# Patient Record
Sex: Female | Born: 1935 | Race: White | Hispanic: No | Smoking: Former smoker
Health system: Southern US, Community
[De-identification: ages and names within clinical notes are randomized; demographics above are authoritative.]

## PROBLEM LIST (undated history)

## (undated) DIAGNOSIS — E118 Type 2 diabetes mellitus with unspecified complications: Secondary | ICD-10-CM

## (undated) DIAGNOSIS — E785 Hyperlipidemia, unspecified: Secondary | ICD-10-CM

## (undated) DIAGNOSIS — E119 Type 2 diabetes mellitus without complications: Secondary | ICD-10-CM

## (undated) DIAGNOSIS — Z9289 Personal history of other medical treatment: Secondary | ICD-10-CM

## (undated) DIAGNOSIS — I249 Acute ischemic heart disease, unspecified: Secondary | ICD-10-CM

## (undated) DIAGNOSIS — F419 Anxiety disorder, unspecified: Secondary | ICD-10-CM

## (undated) DIAGNOSIS — I4891 Unspecified atrial fibrillation: Secondary | ICD-10-CM

## (undated) DIAGNOSIS — I350 Nonrheumatic aortic (valve) stenosis: Secondary | ICD-10-CM

## (undated) DIAGNOSIS — Z9861 Coronary angioplasty status: Secondary | ICD-10-CM

## (undated) DIAGNOSIS — T82855A Stenosis of coronary artery stent, initial encounter: Secondary | ICD-10-CM

## (undated) DIAGNOSIS — I10 Essential (primary) hypertension: Secondary | ICD-10-CM

## (undated) DIAGNOSIS — I219 Acute myocardial infarction, unspecified: Secondary | ICD-10-CM

## (undated) DIAGNOSIS — I2510 Atherosclerotic heart disease of native coronary artery without angina pectoris: Secondary | ICD-10-CM

## (undated) DIAGNOSIS — K219 Gastro-esophageal reflux disease without esophagitis: Secondary | ICD-10-CM

## (undated) HISTORY — DX: Acute ischemic heart disease, unspecified: I24.9

## (undated) HISTORY — DX: Nonrheumatic aortic (valve) stenosis: I35.0

## (undated) HISTORY — DX: Anxiety disorder, unspecified: F41.9

## (undated) HISTORY — DX: Type 2 diabetes mellitus with unspecified complications: E11.8

## (undated) HISTORY — DX: Coronary angioplasty status: Z98.61

## (undated) HISTORY — PX: TUBAL LIGATION: SHX77

## (undated) HISTORY — PX: CARDIAC CATHETERIZATION: SHX172

## (undated) HISTORY — DX: Essential (primary) hypertension: I10

## (undated) HISTORY — PX: ABDOMINAL HYSTERECTOMY: SHX81

## (undated) HISTORY — DX: Type 2 diabetes mellitus without complications: E11.9

## (undated) HISTORY — PX: BREAST BIOPSY: SHX20

## (undated) HISTORY — DX: Hyperlipidemia, unspecified: E78.5

## (undated) HISTORY — DX: Stenosis of coronary artery stent, initial encounter: T82.855A

## (undated) HISTORY — DX: Atherosclerotic heart disease of native coronary artery without angina pectoris: I25.10

## (undated) HISTORY — DX: Personal history of other medical treatment: Z92.89

## (undated) HISTORY — DX: Unspecified atrial fibrillation: I48.91

---

## 1997-12-22 HISTORY — PX: PERCUTANEOUS CORONARY STENT INTERVENTION (PCI-S): SHX6016

## 1998-09-18 ENCOUNTER — Ambulatory Visit (HOSPITAL_COMMUNITY): Admission: RE | Admit: 1998-09-18 | Discharge: 1998-09-19 | Payer: Self-pay | Admitting: Cardiology

## 2000-11-20 ENCOUNTER — Other Ambulatory Visit: Admission: RE | Admit: 2000-11-20 | Discharge: 2000-11-20 | Payer: Self-pay | Admitting: Obstetrics and Gynecology

## 2006-11-21 HISTORY — PX: CORONARY ANGIOPLASTY: SHX604

## 2010-12-22 HISTORY — PX: CARDIAC CATHETERIZATION: SHX172

## 2011-07-31 DIAGNOSIS — I2510 Atherosclerotic heart disease of native coronary artery without angina pectoris: Secondary | ICD-10-CM | POA: Insufficient documentation

## 2011-09-03 ENCOUNTER — Inpatient Hospital Stay (HOSPITAL_COMMUNITY): Payer: Medicare Other

## 2011-09-03 ENCOUNTER — Inpatient Hospital Stay (HOSPITAL_COMMUNITY)
Admission: AD | Admit: 2011-09-03 | Discharge: 2011-09-05 | DRG: 249 | Disposition: A | Discharge status: Home / Self Care | Payer: Medicare Other | Source: Ambulatory Visit | Attending: Cardiovascular Disease | Admitting: Cardiovascular Disease

## 2011-09-03 DIAGNOSIS — I2510 Atherosclerotic heart disease of native coronary artery without angina pectoris: Principal | ICD-10-CM | POA: Diagnosis present

## 2011-09-03 DIAGNOSIS — K922 Gastrointestinal hemorrhage, unspecified: Secondary | ICD-10-CM | POA: Diagnosis present

## 2011-09-03 DIAGNOSIS — Z9861 Coronary angioplasty status: Secondary | ICD-10-CM

## 2011-09-03 DIAGNOSIS — I200 Unstable angina: Secondary | ICD-10-CM | POA: Diagnosis present

## 2011-09-03 DIAGNOSIS — I359 Nonrheumatic aortic valve disorder, unspecified: Secondary | ICD-10-CM | POA: Diagnosis present

## 2011-09-03 DIAGNOSIS — E119 Type 2 diabetes mellitus without complications: Secondary | ICD-10-CM | POA: Diagnosis present

## 2011-09-03 DIAGNOSIS — D62 Acute posthemorrhagic anemia: Secondary | ICD-10-CM | POA: Diagnosis present

## 2011-09-03 DIAGNOSIS — E876 Hypokalemia: Secondary | ICD-10-CM | POA: Diagnosis not present

## 2011-09-03 DIAGNOSIS — N181 Chronic kidney disease, stage 1: Secondary | ICD-10-CM | POA: Diagnosis present

## 2011-09-03 LAB — COMPREHENSIVE METABOLIC PANEL
AST: 25 U/L (ref 0–37)
Albumin: 4.1 g/dL (ref 3.5–5.2)
Chloride: 104 mEq/L (ref 96–112)
Creatinine, Ser: 0.5 mg/dL (ref 0.50–1.10)
Potassium: 3.6 mEq/L (ref 3.5–5.1)
Total Bilirubin: 0.4 mg/dL (ref 0.3–1.2)

## 2011-09-03 LAB — APTT: aPTT: 30 seconds (ref 24–37)

## 2011-09-03 LAB — GLUCOSE, CAPILLARY
Glucose-Capillary: 138 mg/dL — ABNORMAL HIGH (ref 70–99)
Glucose-Capillary: 159 mg/dL — ABNORMAL HIGH (ref 70–99)

## 2011-09-03 LAB — URINALYSIS, ROUTINE W REFLEX MICROSCOPIC
Bilirubin Urine: NEGATIVE
Leukocytes, UA: NEGATIVE
Nitrite: NEGATIVE
Specific Gravity, Urine: 1.009 (ref 1.005–1.030)
pH: 7 (ref 5.0–8.0)

## 2011-09-03 LAB — PROTIME-INR: INR: 1.17 (ref 0.00–1.49)

## 2011-09-03 LAB — CBC
HCT: 24.4 % — ABNORMAL LOW (ref 36.0–46.0)
Hemoglobin: 8.2 g/dL — ABNORMAL LOW (ref 12.0–15.0)
RDW: 13.9 % (ref 11.5–15.5)
WBC: 5 10*3/uL (ref 4.0–10.5)

## 2011-09-04 LAB — BASIC METABOLIC PANEL
CO2: 24 mEq/L (ref 19–32)
GFR calc non Af Amer: >60 mL/min (ref >60)
Glucose, Bld: 161 mg/dL — ABNORMAL HIGH (ref 70–99)
Potassium: 3.9 mEq/L (ref 3.5–5.1)
Sodium: 142 mEq/L (ref 135–145)

## 2011-09-04 LAB — ABO/RH: ABO/RH(D): O POS

## 2011-09-04 LAB — GLUCOSE, CAPILLARY
Glucose-Capillary: 140 mg/dL — ABNORMAL HIGH (ref 70–99)
Glucose-Capillary: 132 mg/dL — ABNORMAL HIGH (ref 70–99)
Glucose-Capillary: 127 mg/dL — ABNORMAL HIGH (ref 70–99)

## 2011-09-04 LAB — IRON AND TIBC
Iron: 30 ug/dL — ABNORMAL LOW (ref 42–135)
Saturation Ratios: 8 % — ABNORMAL LOW (ref 20–55)
UIBC: 340 ug/dL (ref 125–400)

## 2011-09-04 LAB — POCT ACTIVATED CLOTTING TIME: Activated Clotting Time: 303 seconds

## 2011-09-04 LAB — CBC
MCHC: 33.6 g/dL (ref 30.0–36.0)
Platelets: 182 10*3/uL (ref 150–400)
RDW: 13.8 % (ref 11.5–15.5)

## 2011-09-04 LAB — PROTIME-INR
INR: 1.17 (ref 0.00–1.49)
Prothrombin Time: 15.1 seconds (ref 11.6–15.2)

## 2011-09-04 LAB — DIGOXIN LEVEL: Digoxin Level: 1 ng/mL (ref 0.8–2.0)

## 2011-09-04 LAB — FERRITIN: Ferritin: 18 ng/mL (ref 10–291)

## 2011-09-04 LAB — OCCULT BLOOD X 1 CARD TO LAB, STOOL: Fecal Occult Bld: NEGATIVE

## 2011-09-04 LAB — FOLATE: Folate: 17.5 ng/mL

## 2011-09-05 LAB — CBC
HCT: 26.6 % — ABNORMAL LOW (ref 36.0–46.0)
Hemoglobin: 9.2 g/dL — ABNORMAL LOW (ref 12.0–15.0)
MCH: 31.5 pg (ref 26.0–34.0)
MCHC: 34.6 g/dL (ref 30.0–36.0)
MCV: 91.1 fL (ref 78.0–100.0)
Platelets: 183 10*3/uL (ref 150–400)
RBC: 2.92 MIL/uL — ABNORMAL LOW (ref 3.87–5.11)
RDW: 13.6 % (ref 11.5–15.5)
WBC: 5.7 10*3/uL (ref 4.0–10.5)

## 2011-09-05 LAB — BASIC METABOLIC PANEL
CO2: 27 mEq/L (ref 19–32)
Calcium: 9.2 mg/dL (ref 8.4–10.5)
Chloride: 102 mEq/L (ref 96–112)
Creatinine, Ser: 0.55 mg/dL (ref 0.50–1.10)
Glucose, Bld: 136 mg/dL — ABNORMAL HIGH (ref 70–99)
Sodium: 138 mEq/L (ref 135–145)

## 2011-09-05 LAB — GLUCOSE, CAPILLARY: Glucose-Capillary: 159 mg/dL — ABNORMAL HIGH (ref 70–99)

## 2011-09-08 LAB — CROSSMATCH: Unit division: 0

## 2011-09-21 NOTE — Discharge Summary (Signed)
[name], [name]             ACCOUNT NO.:  [id number]  MEDICAL RECORD NO.:  [id number]  LOCATION:  2508                         FACILITY:  [facility]  PHYSICIAN:  Nanetta Batty, M.D.   DATE OF BIRTH:  [birth year]  DATE OF ADMISSION:  09/03/2011 DATE OF DISCHARGE:  09/05/2011                              DISCHARGE SUMMARY   DISCHARGE DIAGNOSIS: 1. Accelerated angina. 2. Coronary artery disease with history of angioplasty in the 90s now     with cardiac catheterization 95% right coronary artery stenosis     cause of her angina.     a.     PTCA and stent with bare-metal stent to the right coronary      artery. 3. Acute blood loss anemia secondary to gastrointestinal bleed, the     week prior to admission, now with heme-negative stools.     a.     Transfuse 1 unit of packed RBCs.     b.     Sees Dr. Teena Dunk in [place].  We have cancelled her EGD      for September 08, 2011.  We will plan to have her proceed with      EGD after 1 month with new stent placed and as per Dr. Clarene Duke. 4. Hypokalemia, replaced. 5. Diabetes mellitus type 2. 6. Mild aortic stenosis. 7. As an outpatient, thought to have chronic kidney disease, but labs     on admission, creatinine 0.50 with a BUN of 12, which would be     stage I.  DISCHARGE CONDITION:  Improved.  PROCEDURES:  Combined left heart catheterization by Dr. Nanetta Batty and undergoing PTCA and stent deployment with a bare-metal stent to the right coronary artery, which was her most likely culprit lesion.  Bare- metal was used as the patient needs gastrointestinal workup for recent gastrointestinal bleed with dark stools that were heme-positive.  DISCHARGE MEDICATIONS:  See medication reconciliation sheet.  DISCHARGE INSTRUCTIONS:  See pink sheet that was given to the patient, the copy of the pink sheet.  She will follow up with Dr. Clarene Duke.  No driving for 2 days.  No lifting for 1 week, heart-healthy diabetic diet.  HOSPITAL COURSE:   The patient was seen by Dr. Alanda Amass on September 02, 2011, who was covering for Dr. Clarene Duke, her regular cardiologist.  She is also a medical patient of Dr. Neita Carp, and she was seen for 2-3 weeks of mild somewhat vague substernal fullness and heaviness.  She also has some right shoulder and right upper arm radiation.  She had several episodes the week prior to the office visit, and it felt to be related to exertion and it would resolve within 2-3 minutes with rest.  She had not taken any nitroglycerin as well, but also the week prior to this visit, she had had episodes of dark tarry stools 2 days in a row.  She was referred to Dr. Teena Dunk, gastroenterologist in [place] and was seen by him on September 01, 2011, and she was set up for endoscopy on September 08, 2011, which is no cancelled.  Previously, she has undergone colonoscopy, which was negative, but again it was dark stools, and no upper endoscopy was done.  From a cardiac standpoint, she had a intervention and angioplasty in 1995 and 1996, and her last cardiac cath was in 1999, and she had a non- DES stent placed to the 90% stenosis of the RCA at that time.  Last Myoview was in 2008, which was negative for ischemia.  She had been doing quite well until the last 2-3 weeks.  When Dr. Alanda Amass saw her because of her diabetes and mild renal insufficiency with azotemia.  On last lab, he felt she needed cardiac catheterization and felt she should come to the hospital a day early to be hydrated.  Now, ACE and ARB were held.  Plans were for elective cardiac catheterization on September 04, 2011.  When the patient presented to the hospital, lab work showed hemoglobin 8.2, hematocrit of 24.4, creatinine was 0.50, and BUN of 12.  We typed and crossed her and by the next morning, we transfused 1 unit of packed cells prior to her cardiac catheterization to ensure that she would not become severely anemic during procedure.  Dr. Allyson Sabal did  cardiac cath.  The previous RCA stent was patent.  She did have 95% tight distal RCA stenosis, and Dr. Allyson Sabal placed a bare-metal stent in that area to 40-50% circumflex stenosis as well.  Her LV function was normal.  She tolerated the procedure and by the next day, had no complaints.  We did find her iron to be 30, total iron binding capacity 370, percent sat 8.  Vitamin B12 of 214, folate 17, dig level was 1.0.  Chemistry, sodium 138, potassium 3.5, BUN 9, creatinine 0.55, and glucose 136.  Hemoglobin was up to 9.2 with hematocrit 26.6.  She ambulated without any complications.  Dr. Royann Shivers saw her and felt she was ready for discharge.  We added iron to her medical regimen as well as plans for CBC on the September 08, 2011.  Plan will be not to do the EGD for 1 month.  Let her continue the Plavix for that time frame and then if she is cleared by Dr. Clarene Duke, she will proceed with EGD unless of course she has acute bleeding in the interim.  RADIOLOGY:  Chest x-ray, no acute cardiopulmonary abnormality.  Please note during this hospitalization her stools were heme negative.  TSH was 2.344.  LFTs were normal.  Iron level as stated was 30 with TIBC of 370, percent sat was 8, UIBC was 340.  B12 was 214 and folate 17.5, ferritin 18, and dig level as stated was 1.0.  The patient will follow up as instructed.     Darcella Gasman. Annie Paras, N.P.   ______________________________ Nanetta Batty, M.D.    LRI/MEDQ  D:  09/08/2011  T:  09/08/2011  Job:  [id number]  cc:   Anice Paganini, NP Thereasa Solo. Little, M.D.  Electronically Signed by Nada Boozer N.P. on 09/09/2011 11:16:52 AM Electronically Signed by Nanetta Batty M.D. on 09/21/2011 11:46:06 AM

## 2011-09-21 NOTE — Cardiovascular Report (Signed)
[name], POUNDS             ACCOUNT NO.:  [id number]  MEDICAL RECORD NO.:  [id number]  LOCATION:  2508                         FACILITY:  [facility]  PHYSICIAN:  Nanetta Batty, M.D.   DATE OF BIRTH:  1936-09-16  DATE OF PROCEDURE:  09/04/2011 DATE OF DISCHARGE:                           CARDIAC CATHETERIZATION   [name] is a delightful 75 year old Caucasian female, mother of three, grandmother of two grandchildren who is a retired Engineer, civil (consulting).  She has a history of CAD status post remote RCA POBA in 1995 and 1996 with stenting of her RCA in 1999.  She has had recurrent angina as well as GI bleed.  She was seen by Dr. Alanda Amass on September 02, 2011, and is referred for cath and potential intervention.  DESCRIPTION OF PROCEDURE:  The patient was brought to the second floor [facility] cardiac cath lab in the postabsorptive state.  She was premedicated with p.o. Valium.  Her right groin was prepped and shaved in the usual sterile fashion.  Xylocaine 1% was used for local anesthesia.  A 5-French sheath was inserted into the right femoral artery using standard Seldinger technique.  A 5-French right and left Judkins diagnostic catheter as well as 5-French pigtail catheter were used for selective coronary angiography and left ventriculography respectively.  Visipaque dye was used for the entirety of the case. Retrograde aorta, left ventricular, and pullback pressures were recorded.  The existing 5-French sheath was exchanged over wire for a 6-French sheath.  The patient received four chewable baby aspirin, Plavix 600 mg p.o., Pepcid 20 mg IV, and Angiomax bolus with an ACT of 303.  Total contrast used during this case was 110 mL for the patient.  Using a 6-French JR-4 guide catheter along with an 0.014 x 190 Asahi soft wire and a 2.0 x 12 Emerge balloon, PCI was performed at 10 atmospheres.  Stenting was performed with 2.5 x 15 Vision bare-metal stent at 12 atmospheres (2.6 mm with  an excellent step-up, TIMI 3 flow. Intracoronary nitroglycerin  200 mcg was administered.  The patient remained hemodynamically stable throughout the case.  The guidewire and catheter removed.  The sheath was then secured in place.  The patient left the lab in stable condition.  HEMODYNAMICS: 1. Aortic systolic pressure 167, diastolic pressure 64. 2. Left ventricular systolic pressure 162, end-diastolic pressure 11.  SELECTIVE CORONARY ANGIOGRAPHY: 1. Left main normal. 2. LAD; normal. 3. Left circumflex; 40-50% mid AV groove circumflex stenosis. 4. Right coronary artery; patent stent in mid portion of the right     coronary artery, 95% eccentric fairly focal lesion at the crux of     the vessel. 5. Left ventriculography; RAO left ventriculogram was performed using     25 mL of Visipaque dye at 12 mL per second.  The overall LVEF was     estimated at greater than 60% without focal wall motion     abnormalities.  IMPRESSION:  [name] has high-grade distal dominant right coronary artery stenosis, most likely her "culprit lesion" responsible for symptoms.  She does have a history of gastrointestinal bleed.  After discussion on the phone with Dr. Alanda Amass, I have elected to proceed with percutaneous coronary intervention  stenting using a bare-metal stent, Angiomax, aspirin, and Plavix.  Successful percutaneous coronary intervention stenting of the distal dominant right coronary artery using a bare-metal stent, aspirin, Plavix, and Angiomax.  The patient did receive a unit of blood prior to the procedure for beginning hemoglobin at approximately 8.  She will be hydrated overnight, and her hemoglobin will be rechecked in the morning. She will need Gastroenterology evaluation for determination of bleeding source.  The patient left the lab in stable condition.     Nanetta Batty, M.D.     JB/MEDQ  D:  09/04/2011  T:  09/05/2011  Job:  [id number]  cc:   [facility] Richard A. Alanda Amass, M.D. Thereasa Solo. Little, M.D.  Electronically Signed by Nanetta Batty M.D. on 09/21/2011 11:46:10 AM

## 2011-11-24 ENCOUNTER — Other Ambulatory Visit (HOSPITAL_COMMUNITY): Payer: Self-pay | Admitting: Cardiology

## 2011-11-24 ENCOUNTER — Encounter (HOSPITAL_COMMUNITY): Payer: Self-pay | Admitting: Pharmacy Technician

## 2011-11-24 MED ORDER — SODIUM CHLORIDE 0.9 % IJ SOLN: 3.0000 mL | INTRAMUSCULAR | Status: DC | PRN

## 2011-11-24 MED ORDER — ACETAMINOPHEN 325 MG PO TABS: 650.0000 mg | ORAL_TABLET | ORAL | Status: DC | PRN

## 2011-11-24 MED ORDER — ONDANSETRON HCL 4 MG/2ML IJ SOLN: 4.0000 mg | Freq: Four times a day (QID) | INTRAMUSCULAR | Status: DC | PRN

## 2011-11-24 MED ORDER — SODIUM CHLORIDE 0.9 % IV SOLN: INTRAVENOUS | Status: DC

## 2011-11-27 ENCOUNTER — Encounter (HOSPITAL_COMMUNITY): Payer: Self-pay | Admitting: General Practice

## 2011-11-27 ENCOUNTER — Ambulatory Visit (HOSPITAL_COMMUNITY)
Admission: RE | Admit: 2011-11-27 | Discharge: 2011-11-28 | Disposition: A | Discharge status: Home / Self Care | Payer: Medicare Other | Source: Ambulatory Visit | Attending: Cardiology | Admitting: Cardiology

## 2011-11-27 ENCOUNTER — Encounter (HOSPITAL_COMMUNITY)
Admission: RE | Disposition: A | Discharge status: Home / Self Care | Payer: Self-pay | Source: Ambulatory Visit | Attending: Cardiology

## 2011-11-27 ENCOUNTER — Other Ambulatory Visit: Payer: Self-pay

## 2011-11-27 DIAGNOSIS — Y831 Surgical operation with implant of artificial internal device as the cause of abnormal reaction of the patient, or of later complication, without mention of misadventure at the time of the procedure: Secondary | ICD-10-CM | POA: Insufficient documentation

## 2011-11-27 DIAGNOSIS — D62 Acute posthemorrhagic anemia: Secondary | ICD-10-CM | POA: Diagnosis present

## 2011-11-27 DIAGNOSIS — I2510 Atherosclerotic heart disease of native coronary artery without angina pectoris: Secondary | ICD-10-CM | POA: Insufficient documentation

## 2011-11-27 DIAGNOSIS — Z955 Presence of coronary angioplasty implant and graft: Secondary | ICD-10-CM

## 2011-11-27 DIAGNOSIS — I359 Nonrheumatic aortic valve disorder, unspecified: Secondary | ICD-10-CM | POA: Insufficient documentation

## 2011-11-27 DIAGNOSIS — E119 Type 2 diabetes mellitus without complications: Secondary | ICD-10-CM | POA: Insufficient documentation

## 2011-11-27 DIAGNOSIS — I200 Unstable angina: Secondary | ICD-10-CM | POA: Insufficient documentation

## 2011-11-27 DIAGNOSIS — I350 Nonrheumatic aortic (valve) stenosis: Secondary | ICD-10-CM | POA: Diagnosis present

## 2011-11-27 DIAGNOSIS — I214 Non-ST elevation (NSTEMI) myocardial infarction: Secondary | ICD-10-CM | POA: Diagnosis present

## 2011-11-27 DIAGNOSIS — Z9861 Coronary angioplasty status: Secondary | ICD-10-CM | POA: Insufficient documentation

## 2011-11-27 DIAGNOSIS — I152 Hypertension secondary to endocrine disorders: Secondary | ICD-10-CM | POA: Diagnosis present

## 2011-11-27 DIAGNOSIS — R195 Other fecal abnormalities: Secondary | ICD-10-CM | POA: Insufficient documentation

## 2011-11-27 DIAGNOSIS — T82897A Other specified complication of cardiac prosthetic devices, implants and grafts, initial encounter: Secondary | ICD-10-CM | POA: Insufficient documentation

## 2011-11-27 DIAGNOSIS — Z01810 Encounter for preprocedural cardiovascular examination: Secondary | ICD-10-CM | POA: Insufficient documentation

## 2011-11-27 DIAGNOSIS — E785 Hyperlipidemia, unspecified: Secondary | ICD-10-CM | POA: Insufficient documentation

## 2011-11-27 DIAGNOSIS — E1169 Type 2 diabetes mellitus with other specified complication: Secondary | ICD-10-CM | POA: Diagnosis present

## 2011-11-27 DIAGNOSIS — I10 Essential (primary) hypertension: Secondary | ICD-10-CM | POA: Insufficient documentation

## 2011-11-27 DIAGNOSIS — I249 Acute ischemic heart disease, unspecified: Secondary | ICD-10-CM | POA: Diagnosis present

## 2011-11-27 HISTORY — PX: LEFT HEART CATHETERIZATION WITH CORONARY ANGIOGRAM: SHX5451

## 2011-11-27 HISTORY — DX: Acute myocardial infarction, unspecified: I21.9

## 2011-11-27 HISTORY — DX: Gastro-esophageal reflux disease without esophagitis: K21.9

## 2011-11-27 SURGERY — LEFT HEART CATHETERIZATION WITH CORONARY ANGIOGRAM
Anesthesia: LOCAL

## 2011-11-27 MED ORDER — VERAPAMIL HCL 2.5 MG/ML IV SOLN
INTRAVENOUS | Status: AC
  Filled 2011-11-27: qty 4

## 2011-11-27 MED ORDER — ASPIRIN EC 81 MG PO TBEC
81.0000 mg | DELAYED_RELEASE_TABLET | Freq: Every day | ORAL | Status: DC
  Administered 2011-11-28: 81 mg via ORAL
  Filled 2011-11-27: qty 1

## 2011-11-27 MED ORDER — SODIUM CHLORIDE 0.9 % IJ SOLN: 3.0000 mL | INTRAMUSCULAR | Status: DC | PRN

## 2011-11-27 MED ORDER — DIGOXIN 125 MCG PO TABS
125.0000 ug | ORAL_TABLET | Freq: Every day | ORAL | Status: DC
  Administered 2011-11-28: 125 ug via ORAL
  Filled 2011-11-27 (×2): qty 1

## 2011-11-27 MED ORDER — CLOPIDOGREL BISULFATE 75 MG PO TABS
75.0000 mg | ORAL_TABLET | Freq: Every day | ORAL | Status: DC
  Administered 2011-11-28: 75 mg via ORAL
  Filled 2011-11-27: qty 1

## 2011-11-27 MED ORDER — POLYSACCHARIDE IRON COMPLEX 150 MG PO CAPS
150.0000 mg | ORAL_CAPSULE | Freq: Two times a day (BID) | ORAL | Status: DC
  Administered 2011-11-27 – 2011-11-28 (×2): 150 mg via ORAL
  Filled 2011-11-27 (×3): qty 1

## 2011-11-27 MED ORDER — ACETAMINOPHEN 325 MG PO TABS
650.0000 mg | ORAL_TABLET | ORAL | Status: DC | PRN
  Administered 2011-11-27: 650 mg via ORAL
  Filled 2011-11-27: qty 2

## 2011-11-27 MED ORDER — OMEGA-3 FATTY ACIDS 1000 MG PO CAPS
1.0000 g | ORAL_CAPSULE | Freq: Every day | ORAL | Status: DC
  Administered 2011-11-28: 1 g via ORAL
  Filled 2011-11-27 (×2): qty 1

## 2011-11-27 MED ORDER — HEPARIN (PORCINE) IN NACL 2-0.9 UNIT/ML-% IJ SOLN
INTRAMUSCULAR | Status: AC
  Filled 2011-11-27: qty 2000

## 2011-11-27 MED ORDER — MORPHINE SULFATE 2 MG/ML IJ SOLN: 1.0000 mg | INTRAMUSCULAR | Status: DC | PRN

## 2011-11-27 MED ORDER — BENAZEPRIL HCL 40 MG PO TABS
40.0000 mg | ORAL_TABLET | Freq: Every day | ORAL | Status: DC
  Administered 2011-11-28: 40 mg via ORAL
  Filled 2011-11-27 (×2): qty 1

## 2011-11-27 MED ORDER — LIDOCAINE HCL (PF) 1 % IJ SOLN
INTRAMUSCULAR | Status: AC
  Filled 2011-11-27: qty 30

## 2011-11-27 MED ORDER — NITROGLYCERIN 0.2 MG/ML ON CALL CATH LAB
INTRAVENOUS | Status: AC
  Filled 2011-11-27: qty 1

## 2011-11-27 MED ORDER — SODIUM CHLORIDE 0.9 % IJ SOLN: 3.0000 mL | Freq: Two times a day (BID) | INTRAMUSCULAR | Status: DC

## 2011-11-27 MED ORDER — ATENOLOL 50 MG PO TABS
50.0000 mg | ORAL_TABLET | Freq: Two times a day (BID) | ORAL | Status: DC
  Administered 2011-11-27 – 2011-11-28 (×2): 50 mg via ORAL
  Filled 2011-11-27 (×3): qty 1

## 2011-11-27 MED ORDER — SODIUM CHLORIDE 0.9 % IV SOLN: 250.0000 mL | INTRAVENOUS | Status: DC | PRN

## 2011-11-27 MED ORDER — FOLIC ACID 1 MG PO TABS
1.0000 mg | ORAL_TABLET | Freq: Every day | ORAL | Status: DC
  Administered 2011-11-27 – 2011-11-28 (×2): 1 mg via ORAL
  Filled 2011-11-27 (×2): qty 1

## 2011-11-27 MED ORDER — BIVALIRUDIN 250 MG IV SOLR
INTRAVENOUS | Status: AC
  Filled 2011-11-27: qty 250

## 2011-11-27 MED ORDER — SODIUM CHLORIDE 0.9 % IV SOLN
INTRAVENOUS | Status: DC
  Administered 2011-11-27: 08:00:00 via INTRAVENOUS

## 2011-11-27 MED ORDER — AMLODIPINE BESYLATE 10 MG PO TABS
10.0000 mg | ORAL_TABLET | Freq: Every day | ORAL | Status: DC
  Administered 2011-11-28: 10 mg via ORAL
  Filled 2011-11-27 (×2): qty 1

## 2011-11-27 MED ORDER — HYDRALAZINE HCL 25 MG PO TABS
25.0000 mg | ORAL_TABLET | Freq: Two times a day (BID) | ORAL | Status: DC
  Administered 2011-11-27 – 2011-11-28 (×2): 25 mg via ORAL
  Filled 2011-11-27 (×3): qty 1

## 2011-11-27 MED ORDER — SODIUM CHLORIDE 0.9 % IJ SOLN
3.0000 mL | Freq: Two times a day (BID) | INTRAMUSCULAR | Status: DC
  Administered 2011-11-27 – 2011-11-28 (×2): 3 mL via INTRAVENOUS

## 2011-11-27 MED ORDER — ASPIRIN 81 MG PO CHEW
CHEWABLE_TABLET | ORAL | Status: AC
  Filled 2011-11-27: qty 3

## 2011-11-27 MED ORDER — FUROSEMIDE 20 MG PO TABS
20.0000 mg | ORAL_TABLET | Freq: Two times a day (BID) | ORAL | Status: DC
  Administered 2011-11-27 – 2011-11-28 (×2): 20 mg via ORAL
  Filled 2011-11-27 (×3): qty 1

## 2011-11-27 MED ORDER — PANTOPRAZOLE SODIUM 40 MG PO TBEC
40.0000 mg | DELAYED_RELEASE_TABLET | Freq: Every day | ORAL | Status: DC
  Administered 2011-11-28: 40 mg via ORAL
  Filled 2011-11-27: qty 1

## 2011-11-27 MED ORDER — SODIUM CHLORIDE 0.9 % IV SOLN: INTRAVENOUS | Status: DC

## 2011-11-27 MED ORDER — OLMESARTAN MEDOXOMIL 40 MG PO TABS
40.0000 mg | ORAL_TABLET | Freq: Every day | ORAL | Status: DC
  Administered 2011-11-28: 40 mg via ORAL
  Filled 2011-11-27 (×2): qty 1

## 2011-11-27 MED ORDER — ROSUVASTATIN CALCIUM 20 MG PO TABS
20.0000 mg | ORAL_TABLET | Freq: Every day | ORAL | Status: DC
  Administered 2011-11-27: 20 mg via ORAL
  Filled 2011-11-27 (×2): qty 1

## 2011-11-27 MED ORDER — DIPHENHYDRAMINE HCL 50 MG/ML IJ SOLN
INTRAMUSCULAR | Status: AC
  Filled 2011-11-27: qty 1

## 2011-11-27 MED ORDER — SODIUM CHLORIDE 0.9 % IV SOLN: 1.0000 mL/kg/h | INTRAVENOUS | Status: AC

## 2011-11-27 MED ORDER — ASPIRIN 81 MG PO CHEW
243.0000 mg | CHEWABLE_TABLET | Freq: Once | ORAL | Status: AC
  Administered 2011-11-27: 243 mg via ORAL

## 2011-11-27 MED ORDER — ONDANSETRON HCL 4 MG/2ML IJ SOLN: 4.0000 mg | Freq: Four times a day (QID) | INTRAMUSCULAR | Status: DC | PRN

## 2011-11-27 MED ORDER — FENTANYL CITRATE 0.05 MG/ML IJ SOLN
INTRAMUSCULAR | Status: AC
  Filled 2011-11-27: qty 2

## 2011-11-27 MED ORDER — HEPARIN SODIUM (PORCINE) 1000 UNIT/ML IJ SOLN
INTRAMUSCULAR | Status: AC
  Filled 2011-11-27: qty 1

## 2011-11-27 MED ORDER — ASPIRIN 81 MG PO CHEW: 324.0000 mg | CHEWABLE_TABLET | Freq: Once | ORAL | Status: DC

## 2011-11-27 NOTE — Op Note (Signed)
[facility]     CARDIAC CATHETERIZATION REPORT  NAME: [name] MRN: [id number] DOB: [birth year]  ADMIT DATE:   Performing Cardiologist: Marykay Lex, M.D. MS  Primary Physician: Provider Not In System Primary Cardiologist:  Julieanne Manson M.D.  Procedures Performed:  Left Heart Catheterization via 5 Fr Right Radial Artery access  Left Ventriculography, (RAO) - hand-injection, 10 mL  Native Coronary Angiography  Percutaneous Coronary Artery Intervention (Cutting Balloon Atherectomy/Angioplasty) on distal RCA in-stent restenosis 90% reduced to less than 10% with a 2.5 mm x 10 mm Flextome Cutting Balloon; final diameter 2.65 mm  Indication(s): Recurrent angina  Status post recent terminal stent to RCA (2.5 mm x 15 mm Vision BMS (bare-metal stent)    History: [name] is a 75 year old female patient of Dr. Julieanne Manson with a history of PCI to the RCA 1999 and subsequent PCI of the RCA to the distal RCA for chest pain/right shoulder pain in September 2012. She was in her usual state of health until her for a week ago which began to have recurrence of her right shoulder/arm and chest pain. She was seen by Dr. Clarene Duke on December 4 felt that this was recurrence of her anginal symptoms and referred her for invasive cardiac evaluation for cardiac apposition posterior minus PCI. She requested radial artery access and therefore I was asked performed procedure.  A bare-metal stent was used due to the patient's history of GI bleed.  Consent: The procedure with Risks/Benefits/Alternatives and Indications was reviewed with the patient (and family).  All questions were answered.    Risks / Complications include, but not limited to: Death, MI, CVA/TIA, VF/VT (with defibrillation), Bradycardia (need for temporary pacer placement), contrast induced nephropathy , bleeding / bruising / hematoma / pseudoaneurysm, vascular or coronary injury (with possible  emergent CT or Vascular Surgery), adverse medication reactions, infection.    The patient ( and family) voice understanding and agree to proceed.    Risks of procedure as well as the alternatives and risks of each were explained to the (patient/caregiver).  Consent for procedure obtained. Consent for signed by MD and patient with RN witness -- placed on chart.  Procedure: The patient was brought to the 2nd Floor [facility] in the fasting state and prepped and draped in the usual sterile fashion for (Right groin or radial) access.  A modified Allen's test with plethysmography was performed on the right wrist demonstrating adequate Ulnar Artery collateral flow).    Sterile technique was used including antiseptics, cap, gloves, gown, hand hygiene, mask and sheet.  Skin prep: Chlorhexidine;  Time Out: Verified patient identification, verified procedure, site/side was marked, verified correct patient position, special equipment/implants available, medications/allergies/relevent history reviewed, required imaging and test results available.  Performed  The right wrist was anesthetized with 1% subcutaneous Lidocaine.  The right radial artery was accessed using the Seldinger Technique with placement of a  5 Fr Glide Sheath. The sheath was aspirated and flushed.  Then a total of  10 ml of standard Radial Artery Cocktail (see medications) was infused.  Radial Cocktail: 5 mg Verapamil, 400 mcg NTG, 2 ml 2% Lidocaine.  A  5 Fr  TIG 4.0 Catheter was advanced of over a Lexicographer J wire into the ascending Aorta.  The catheter was used to engage the left than right coronary arteries.  Multiple cineangiographic views of both the Left and Right coronary artery system(s) were performed.   Hemodynamics:  Central  Aortic Pressure: 01/14/1944 mmHg; 70 mmHg  LV Pressure / LV End diastolic Pressure: 125/6 mmHg; 18 mmHg  Coronary Angiographic Data:   Dominance: Right  Left Main:   Large-caliber, angiographically normal; bifurcation into LAD and circumflex  Left Anterior Descending (LAD):  Moderate to large caliber, angiographically normal  1st diagonal (D1):  Small-caliber, tortuous, angiographically normal  2nd diagonal (D2):  Small-caliber, tortuous, angiographically normal  Circumflex (LCx):  Moderate to large-caliber, angiographically normal   1st obtuse marginal:   Small-caliber, ostial 60%.  2nd obtuse marginal:  Large branching vessel as the terminal and main branch vessel it gives rise to 2 inferior branches as OMs; angiographically normal   Atrio-ventricular Groove (AVG) Cx: Small-caliber with small posterior lateral branches, angiograpically normal  Right Coronary Artery: Moderate to large caliber dominant vessel, proximal stent from 1999 widely patent. Distal stent in midportion, 90% focal in-stent restenosis, non-thrombotic  Right Posterior Descending Artery (RPDA):  Small-caliber, minimal luminal irregularities  Right Atrio-ventricular Groove Branch (RPAVB):  Small-caliber minimal, luminal irregularities, branches into the 3 posterior lateral branches that taper to her small-caliber vessels with angiographically only minimal luminal irregularities  PERCUTANEOUS CORONARY INTERVENTION PROCEDURE  After reviewing the initial cineangiography images, the culprit lesion in the distal RCA stent was identified, and the decision was made to proceed with percutaneous coronary intervention.  Next discuss with Dr. Clarene Duke plan was to perform cutting balloon atherectomy.  The 5Fr Sheath was exchanged over a wire for a 6Fr sheath.  However a 6 [place] guide was successfully advanced therefore I should use a 5 [place] guide.  Additional intra-arterial nitroglycerin (400 g total) given through catheter in antecubital brachial artery.  A 6 [place] guide removed an additional 5 mg of her verapamil was a minister intra-arterially through the sheath for presumed spasm. Scout  brachial artery angiography revealed no perforation or dissection with mild spasm. The 5 [place] guide was advanced under fluoroscopic guidance through the tortuous portion in the antecubital region and successfully advanced into the ascending aorta.  A weight based bolus of IV Angiomax was administered and the drip was continued until completion of the procedure.  Oral  75 mg Clopidogrel and aspirin 324 milligrams was administered as per her home medications preprocedure.  The  5 [place] JR 4 Guide catheter  wasadvanced over a  long exchange safety J-wire and used to engage the right Coronary Artery. An ACT of > 200 Sec was confirmed prior to advancing the Guidewire.  Lesion #1:  Distal RCA, mid stent ISR  Pre-PCI Stenosis:  90 % Post-PCI Stenosis:  Less than 10 %     TIMI  3 flow       TIMI  3 flow  Guide Catheter:  5 [place] JR 4    Guidewire:  Training and development officer Balloon: Flextome 2.5 mm x 10 mm    1st Inflation:   6 Atm for 50 Sec   2nd Inflation: 10 Atm for 60 Sec   3rd Inflation: 12 Atm for 120 Sec  4th inflation: 12 Atm for 120 Sec -- Final diameter 2.6 mm   Post deployment angiography revealed no dissection or perforation, preserved TIMI-3 flow, appropriate fashion of the previously placed stent with less than 10% stenosis.  This catheter was then advanced across the Aortic Valve over a wire.  LV hemodynamics were measured (and) Left Ventriculography was performed via hand injection.  LV hemodynamics were then re-sampled, and the catheter was pulled back across the Aortic Valve for measurement of "pull-back" gradient.  The catheter was removed completely out of the body over the [employer] J wire.  The sheath was removed in the Cath Lab with a TR band placed at  12 ml Air at  1030 hrs.  Reverse Allen''stest with plethysmography revealed non-occlusive hemostasis.  The patient was transported to the  PACU in  Stable condition.   The patient  was stable before, during and  following the procedure.   Patient did tolerate procedure well. There were not complications.  EBL:  Less than 10 mL  Medications:  Sedation:  25 mg IV  Benadryl ,  25 mcg  IV Fentanyl  Contrast:  170 ml Omnipaque  Radial Cocktail: 5 mg Verapamil, 400 mcg NTG, 2 ml 2% Lidocaine; + additional 5 mg Verapamil, 400 mcg NTG intra-arterial via sheath/catheter.  IV Heparin: 3600 units IV at time of Catheter placement  Angiomax: Bolus (0.75 mg/kg), drip (1.75 mg per kilogram per hour) during intervention  Impression: 1.  ~90% focal ISR of recently placed BMS in distal RCA, other coronary arteries without obstructive lesions.  Proximal RCA stent is widely patent. 2.  Successful Cutting Balloon Atherectomy / Angioplasty of distal RCA stent 3.  Preserved LVEF of ~55%, no WMA  Plan: 1.  Monitor overnight post PCI, standard post radial care 2.  Continue current medications 3.  Anticipate d/c tomorrow if stable.  The case and results was discussed with the patient (and family). The case and results was not discussed with the patient's PCP. The case and results was discussed with the patient's Cardiologist.  Time Spend Directly with Patient:  75  minutes  Timi Reeser W, M.D., M.S. [facility] [address] [place], [place]  [address]  [phone]  11/27/2011 10:58 AM

## 2011-11-27 NOTE — Brief Op Note (Addendum)
11/27/2011  10:45 AM  PATIENT:  [name]  75 y.o. female with a A history of PCI to the RCA in 1999, who subsequently underwent PCI of the RCA for chest pain/R shoulder pain in 08/2011. This initially lead to resolution of her symptoms, however the R shoulder pain has recurred, and her energy level has deteriorated despite cardiac rehab. The shoulder pain occurs with activity and also wakes her up from sleep.  PRE-OPERATIVE DIAGNOSIS:  chest pain, known CAD with recent PCI to RCA  POST-OPERATIVE DIAGNOSIS:  Severe ~90% ISR of recently deployed 2.5 mm x 15 mm Vision BMS --> Rx'd with 2.14mm Cutting balloon Angioplasty  PROCEDURE:  LEFT HEART CATHETERIZATION WITH CORONARY ANGIOGRAM and LEFT VENTRICULOGRAM - via 5Fr Radial access (5 Fr TIG 4.0 Catheter)  PERCUTANEOUS CORONARY INTERVENTION - CUTTING BALLOON ATHERECTOMY OF DISTAL RCA STENT IN-STENT RESTENOSIS. (6Fr sheath, bu 5 JR4 Guide Catheter - due to brachial artery tortuosity).  INTRA CORONARY, AND INTRA-ARTERIAL NITROGLYCERIN INJECTION  Surgeon(s): Marykay Lex  ANESTHESIA:   local and IV sedation -- Fentanyl ; Benadryl 25 mg MEDICATIONS:   Angiomax Bolus  (0.75mg /kg) ; drip (1.75mg /kg/hr) during intervention  Radial Cocktail: 5 mg Verapamil, 400 mcg NTG, 2 ml 2% Lidocaine; Additional 5 mg Verapamil and 400 mcg NTG also administered IA via sheath or catheter in brachial artery.  IC Nitroglycerin - total 400 mcg  EBL:   <59ml  DEVICE - 2.5 mm x 10 mm Flextome Cutting Balloon   6 ATM - 45 sec; 10 ATM - 60 sec; 12 ATM - 120 sec x 2  LOCAL MEDICATIONS USED:  LIDOCAINE 2 ml   TOURNIQUET: TR Band - 12 ml air; 1030 hrs  DICTATION: .Note written in EPIC  PLAN OF CARE: Admit for overnight observation  PATIENT DISPOSITION:  PACU - hemodynamically stable.  Continue DAP ( ASA, Plavix) & home antihypertensives  Formulary alternative for statin.  Pharmacological VTE agent (>24hrs) due to surgical blood loss or risk  of bleeding:  Not needed.  Primary Cardiologist - Dr. Langston Reusing informed.

## 2011-11-27 NOTE — H&P (Addendum)
History and Physical Interval Note:  NAME:  [name]   MRN: [id number] DOB:  12-12-1936    ADMIT DATE: 11/27/2011   11/27/2011 7:55 AM  [name] is a 75 y.o. female with a  A history of PCI to the RCA in 1999, who subsequently underwent PCI of the RCA for chest pain/R shoulder pain in 08/2011.  This initially lead to resolution of her symptoms, however the R shoulder pain has recurred, and her energy level has deteriorated despite cardiac rehab.  The shoulder pain occurs with activity and also wakes her up from sleep.  Mrs. Satre has presented today for surgery, with the diagnosis of chest pain - and known CAD, s/p RCA PCI.  The various methods of treatment have been discussed with the patient and family. After consideration of risks, benefits and other options for treatment, the patient has consented to Procedure(s):  LEFT HEART CATHETERIZATION AND CORONARY ANGIOGRAPHY +/- AD HOC PERCUTANEOUS CORONARY INTERVENTION as a surgical intervention.   The patients' history has been reviewed, patient examined, no change in status from most recent note - dated 11/25/11 by Dr. Clarene Duke.  She stable for surgery. I have reviewed the patients' chart and labs.   She has requested radial artery access, therefore, Dr. Clarene Duke has asked me to perform the procedure. Questions were answered to the patient's satisfaction.    The procedure with Risks/Benefits/Alternatives and Indications was reviewed with the patient and her husband.  All questions were answered.    Risks / Complications include, but not limited to: Death, MI, CVA/TIA, VF/VT (with defibrillation), Bradycardia (need for temporary pacer placement), contrast induced nephropathy, bleeding / bruising / hematoma / pseudoaneurysm, vascular or coronary injury (with possible emergent CT or Vascular Surgery), adverse medication reactions, infection.    The patient (and family) voice understanding and agree to proceed.   I have signed the consent  form and placed it on the chart for patient signature and RN witness.     Marykay Lex, M.D., M.S. [facility] [address] [place], [place]  [address]  [phone]  11/27/2011 7:55 AM

## 2011-11-28 ENCOUNTER — Other Ambulatory Visit: Payer: Self-pay

## 2011-11-28 DIAGNOSIS — E785 Hyperlipidemia, unspecified: Secondary | ICD-10-CM | POA: Diagnosis present

## 2011-11-28 DIAGNOSIS — I350 Nonrheumatic aortic (valve) stenosis: Secondary | ICD-10-CM | POA: Diagnosis present

## 2011-11-28 DIAGNOSIS — D62 Acute posthemorrhagic anemia: Secondary | ICD-10-CM | POA: Diagnosis present

## 2011-11-28 DIAGNOSIS — I249 Acute ischemic heart disease, unspecified: Secondary | ICD-10-CM | POA: Diagnosis present

## 2011-11-28 DIAGNOSIS — I214 Non-ST elevation (NSTEMI) myocardial infarction: Secondary | ICD-10-CM

## 2011-11-28 DIAGNOSIS — Z955 Presence of coronary angioplasty implant and graft: Secondary | ICD-10-CM

## 2011-11-28 DIAGNOSIS — I152 Hypertension secondary to endocrine disorders: Secondary | ICD-10-CM | POA: Diagnosis present

## 2011-11-28 DIAGNOSIS — I10 Essential (primary) hypertension: Secondary | ICD-10-CM | POA: Diagnosis present

## 2011-11-28 DIAGNOSIS — E119 Type 2 diabetes mellitus without complications: Secondary | ICD-10-CM | POA: Diagnosis present

## 2011-11-28 DIAGNOSIS — E1169 Type 2 diabetes mellitus with other specified complication: Secondary | ICD-10-CM | POA: Diagnosis present

## 2011-11-28 HISTORY — DX: Hyperlipidemia, unspecified: E78.5

## 2011-11-28 HISTORY — DX: Non-ST elevation (NSTEMI) myocardial infarction: I21.4

## 2011-11-28 LAB — GLUCOSE, CAPILLARY
Glucose-Capillary: 152 mg/dL — ABNORMAL HIGH (ref 70–99)
Glucose-Capillary: 128 mg/dL — ABNORMAL HIGH (ref 70–99)

## 2011-11-28 LAB — BASIC METABOLIC PANEL
BUN: 10 mg/dL (ref 6–23)
CO2: 24 mEq/L (ref 19–32)
Chloride: 104 mEq/L (ref 96–112)
Creatinine, Ser: 0.47 mg/dL — ABNORMAL LOW (ref 0.50–1.10)
Glucose, Bld: 152 mg/dL — ABNORMAL HIGH (ref 70–99)

## 2011-11-28 LAB — CBC
HCT: 29.1 % — ABNORMAL LOW (ref 36.0–46.0)
Hemoglobin: 9 g/dL — ABNORMAL LOW (ref 12.0–15.0)
MCH: 26.2 pg (ref 26.0–34.0)
MCHC: 30.9 g/dL (ref 30.0–36.0)
MCV: 84.6 fL (ref 78.0–100.0)
Platelets: 169 10*3/uL (ref 150–400)
RBC: 3.44 MIL/uL — ABNORMAL LOW (ref 3.87–5.11)
RDW: 15.7 % — ABNORMAL HIGH (ref 11.5–15.5)
WBC: 5.4 10*3/uL (ref 4.0–10.5)

## 2011-11-28 LAB — OCCULT BLOOD X 1 CARD TO LAB, STOOL: Fecal Occult Bld: POSITIVE

## 2011-11-28 MED ORDER — METFORMIN HCL 500 MG PO TABS: 1000.0000 mg | ORAL_TABLET | Freq: Every day | ORAL | Status: DC

## 2011-11-28 MED ORDER — ACETAMINOPHEN 325 MG PO TABS
650.0000 mg | ORAL_TABLET | ORAL | Status: AC | PRN
Start: 2011-11-28 — End: 2011-12-08

## 2011-11-28 MED ORDER — NITROGLYCERIN 0.4 MG SL SUBL: 0.4000 mg | SUBLINGUAL_TABLET | SUBLINGUAL | Status: DC | PRN

## 2011-11-28 MED FILL — Dextrose Inj 5%: INTRAVENOUS | Qty: 50 | Status: AC

## 2011-11-28 NOTE — Progress Notes (Signed)
CARDIAC REHAB PHASE I   PRE:  Rate/Rhythm: 69 SR  BP:  Supine:   Sitting: 165/5  Standing:    SaO2: 100 RA  MODE:  Ambulation: 680 ft   POST:  Rate/Rhythem: 91 SR  BP:  Supine:   Sitting: 151/48   Standing:    SaO2: 100 RA 0900 -1000 Tolerated ambulation well without c/o.Completed discharge education with pt. She voices understanding. She is already in [facility]. CRP in [place], ready to restart when allowed by MD.  [name]

## 2011-11-28 NOTE — Progress Notes (Signed)
Subjective:  No further dyspnea or shoulder pain. No recent melanic stools.  Objective:  Vital Signs in the last 24 hours: Temp:  [97.7 F (36.5 C)-98.3 F (36.8 C)] 97.7 F (36.5 C) (12/07 0700) Pulse Rate:  [61-74] 61  (12/07 0700) Resp:  [14-20] 18  (12/07 0700) BP: (129-150)/(51-82) 136/51 mmHg (12/07 0700) SpO2:  [94 %-100 %] 100 % (12/07 0700) Weight:  [69.7 kg (153 lb 10.6 oz)] 153 lb 10.6 oz (69.7 kg) (12/07 1610)  Intake/Output from previous day:  Intake/Output Summary (Last 24 hours) at 11/28/11 1051 Last data filed at 11/28/11 0648  Gross per 24 hour  Intake 834.55 ml  Output   1200 ml  Net -365.45 ml    Physical Exam: General appearance: alert, cooperative and no distress Lungs: clear to auscultation bilaterally Heart: regular rate and rhythm and 2/6 syst murmur Aov area and LSB Extrem: Rt wrist withou hematoma   Rate: 60  Rhythm: normal sinus rhythm and PVC's  Lab Results:  Basename 11/28/11 0358  WBC 5.4  HGB 9.0*  PLT 169    Basename 11/28/11 0358  NA 141  K 3.4*  CL 104  CO2 24  GLUCOSE 152*  BUN 10  CREATININE 0.47*   No results found for this basename: TROPONINI:2,CK,MB:2 in the last 72 hours Hepatic Function Panel No results found for this basename: PROT,ALBUMIN,AST,ALT,ALKPHOS,BILITOT,BILIDIR,IBILI in the last 72 hours No results found for this basename: CHOL in the last 72 hours No results found for this basename: INR in the last 72 hours  Imaging: No results found.  Cardiac Studies:  Assessment/Plan:   Principal Problem:  *Acute coronary syndrome  Active Problems:  CAD-RCA PCI 1999, RCA BMS Sept 2012, ISR this adm Rx'd with HSRA  Anemia, GI bleed Sept 2012. Followed in [place], had upper GI as op.  Diabetes mellitus  Unstable angina  Hypertension  Dyslipidemia  Mild AS by 2D Sept 2012  Plan-Check stool guaiac, if positive she may need further in-pt w/u.Corine Shelter PA-C 11/28/2011, 10:51 AM    Note- Stool  guaiac positive

## 2011-11-28 NOTE — Discharge Summary (Signed)
Patient ID: [name],  MRN: [id number], DOB/AGE: 1936-10-13 74 y.o.  Admit date: 11/27/2011 Discharge date: 11/28/2011  Primary Care Provider:  Primary Cardiologist:   Discharge Diagnoses Principal Problem:  *Acute coronary syndrome  Active Problems:  Presence of stent in right coronary artery, HSRA for ISR this admission  Anemia associated with acute blood loss  Diabetes mellitus  Aortic stenosis, mild  Unstable angina  Hypertension  Dyslipidemia    Procedures: PCI 11/27/11  History of Present Illness: [name] is a 75 y.o. female with a A history of PCI to the RCA in 1999, who subsequently underwent PCI of the RCA for chest pain/R shoulder pain in 08/2011. This initially lead to resolution of her symptoms, however the R shoulder pain has recurred, and her energy level has deteriorated despite cardiac rehab. The shoulder pain occurs with activity and also wakes her up from sleep.   Hospital Course [name] has a history of CAD, S/P RCA intervention in 1999. She had a bare-metal stent to the RCA in September of 2012. She's developed recurrent anginal symptoms which for her consist of shoulder pain and back pain. She was admitted for elective catheterization on 11/27/2011. She did have in-stent restenosis to the RCA and this was treated with high-speed rotational atherectomy. She tolerated the procedure well. During the hospitalization she was noted to be mildly anemic with a hemoglobin of 9.0. She does have a history of GI bleeding and in September 2012 did have a GI workup in [place]. She says she had a GI scan but not an endoscopy. Her hemoglobin post procedure was 9.0 and her stool was guaiac positive.  Discharge Vitals:  Blood pressure 157/50, pulse 60, temperature 98.1 F (36.7 C), temperature source Oral, resp. rate 16, height 5\' 6"  (1.676 m), weight 69.7 kg (153 lb 10.6 oz), SpO2 98.00%.    Labs: Results for orders placed during the hospital encounter of  11/27/11 (from the past 48 hour(s))  GLUCOSE, CAPILLARY     Status: Abnormal   Collection Time   11/27/11  7:21 AM      Component Value Range Comment   Glucose-Capillary 167 (*) 70 - 99 (mg/dL)   POCT ACTIVATED CLOTTING TIME     Status: Normal   Collection Time   11/27/11  9:57 AM      Component Value Range Comment   Activated Clotting Time 474     GLUCOSE, CAPILLARY     Status: Abnormal   Collection Time   11/27/11 10:42 AM      Component Value Range Comment   Glucose-Capillary 182 (*) 70 - 99 (mg/dL)   GLUCOSE, CAPILLARY     Status: Abnormal   Collection Time   11/27/11  6:34 PM      Component Value Range Comment   Glucose-Capillary 103 (*) 70 - 99 (mg/dL)   GLUCOSE, CAPILLARY     Status: Abnormal   Collection Time   11/27/11  9:25 PM      Component Value Range Comment   Glucose-Capillary 164 (*) 70 - 99 (mg/dL)    Comment 1 Notify RN      Comment 2 Documented in Chart     CBC     Status: Abnormal   Collection Time   11/28/11  3:58 AM      Component Value Range Comment   WBC 5.4  4.0 - 10.5 (K/uL)    RBC 3.44 (*) 3.87 - 5.11 (MIL/uL)    Hemoglobin 9.0 (*) 12.0 -  15.0 (g/dL)    HCT 09.8 (*) 11.9 - 46.0 (%)    MCV 84.6  78.0 - 100.0 (fL)    MCH 26.2  26.0 - 34.0 (pg)    MCHC 30.9  30.0 - 36.0 (g/dL)    RDW 14.7 (*) 82.9 - 15.5 (%)    Platelets 169  150 - 400 (K/uL)   BASIC METABOLIC PANEL     Status: Abnormal   Collection Time   11/28/11  3:58 AM      Component Value Range Comment   Sodium 141  135 - 145 (mEq/L)    Potassium 3.4 (*) 3.5 - 5.1 (mEq/L)    Chloride 104  96 - 112 (mEq/L)    CO2 24  19 - 32 (mEq/L)    Glucose, Bld 152 (*) 70 - 99 (mg/dL)    BUN 10  6 - 23 (mg/dL)    Creatinine, Ser 5.62 (*) 0.50 - 1.10 (mg/dL)    Calcium 9.0  8.4 - 10.5 (mg/dL)    GFR calc non Af Amer >90  >90 (mL/min)    GFR calc Af Amer >90  >90 (mL/min)   GLUCOSE, CAPILLARY     Status: Abnormal   Collection Time   11/28/11  7:57 AM      Component Value Range Comment    Glucose-Capillary 152 (*) 70 - 99 (mg/dL)    Comment 1 Notify RN     OCCULT BLOOD X 1 CARD TO LAB, STOOL     Status: Normal   Collection Time   11/28/11 11:13 AM      Component Value Range Comment   Fecal Occult Bld POSITIVE     GLUCOSE, CAPILLARY     Status: Abnormal   Collection Time   11/28/11 12:52 PM      Component Value Range Comment   Glucose-Capillary 128 (*) 70 - 99 (mg/dL)     Disposition:    Discharge Medications:  Current Discharge Medication List    START taking these medications   Details  acetaminophen (TYLENOL) 325 MG tablet Take 2 tablets (650 mg total) by mouth every 4 (four) hours as needed. Qty: 30 tablet    nitroGLYCERIN (NITROSTAT) 0.4 MG SL tablet Place 1 tablet (0.4 mg total) under the tongue every 5 (five) minutes as needed for chest pain. Qty: 25 tablet, Refills: 2      CONTINUE these medications which have CHANGED   Details  metFORMIN (GLUCOPHAGE) 500 MG tablet Take 2 tablets (1,000 mg total) by mouth daily.      CONTINUE these medications which have NOT CHANGED   Details  amLODipine (NORVASC) 10 MG tablet Take 10 mg by mouth daily.      aspirin EC 81 MG tablet Take 81 mg by mouth daily.      atenolol (TENORMIN) 50 MG tablet Take 50 mg by mouth 2 (two) times daily.      atorvastatin (LIPITOR) 40 MG tablet Take 40 mg by mouth at bedtime.      benazepril (LOTENSIN) 40 MG tablet Take 40 mg by mouth daily.      clopidogrel (PLAVIX) 75 MG tablet Take 75 mg by mouth daily.      digoxin (LANOXIN) 0.125 MG tablet Take 125 mcg by mouth daily.      fish oil-omega-3 fatty acids 1000 MG capsule Take 1 g by mouth daily.      folic acid (FOLVITE) 1 MG tablet Take 1 mg by mouth daily.      furosemide (  LASIX) 20 MG tablet Take 20 mg by mouth 2 (two) times daily.      hydrALAZINE (APRESOLINE) 25 MG tablet Take 25 mg by mouth 2 (two) times daily.      iron polysaccharides (NIFEREX) 150 MG capsule Take 150 mg by mouth 2 (two) times daily.        pantoprazole (PROTONIX) 40 MG tablet Take 40 mg by mouth daily.      valsartan (DIOVAN) 320 MG tablet Take 160 mg by mouth daily.          Outstanding Labs/Studies: none  Duration of Discharge Encounter: Greater than 30 minutes including physician time.  Jolene Provost PA-C 11/28/2011 2:50 PM

## 2011-11-28 NOTE — Progress Notes (Signed)
Pt. Seen and examined. Agree with the NP/PA-C note as written.  Guiac positive stools .Marland Kitchen Already has planned outpatient EGD in past.  No chest pain. Cath site stable. Ok for d/c home today.  Chrystie Nose, MD Attending Cardiologist [facility]

## 2011-11-30 DIAGNOSIS — I2510 Atherosclerotic heart disease of native coronary artery without angina pectoris: Secondary | ICD-10-CM

## 2011-11-30 HISTORY — DX: Atherosclerotic heart disease of native coronary artery without angina pectoris: I25.10

## 2012-07-21 ENCOUNTER — Encounter: Payer: Self-pay | Admitting: Internal Medicine

## 2012-11-09 ENCOUNTER — Encounter: Payer: Medicare Other | Admitting: Internal Medicine

## 2012-11-09 DIAGNOSIS — D509 Iron deficiency anemia, unspecified: Secondary | ICD-10-CM

## 2013-01-18 DIAGNOSIS — D509 Iron deficiency anemia, unspecified: Secondary | ICD-10-CM

## 2013-01-25 DIAGNOSIS — D509 Iron deficiency anemia, unspecified: Secondary | ICD-10-CM

## 2013-03-07 ENCOUNTER — Encounter: Payer: Medicare Other | Admitting: Internal Medicine

## 2013-03-07 DIAGNOSIS — E538 Deficiency of other specified B group vitamins: Secondary | ICD-10-CM

## 2013-03-07 DIAGNOSIS — D509 Iron deficiency anemia, unspecified: Secondary | ICD-10-CM

## 2013-03-07 DIAGNOSIS — D649 Anemia, unspecified: Secondary | ICD-10-CM

## 2013-07-15 ENCOUNTER — Other Ambulatory Visit: Payer: Self-pay | Admitting: *Deleted

## 2013-07-15 MED ORDER — FOLIC ACID 1 MG PO TABS: 1.0000 mg | ORAL_TABLET | Freq: Every day | ORAL | Status: DC

## 2013-07-15 NOTE — Telephone Encounter (Signed)
Rx was sent to pharmacy electronically. 

## 2013-07-22 ENCOUNTER — Encounter: Payer: Self-pay | Admitting: Cardiology

## 2013-07-22 ENCOUNTER — Ambulatory Visit (INDEPENDENT_AMBULATORY_CARE_PROVIDER_SITE_OTHER): Payer: Medicare Other | Admitting: Cardiology

## 2013-07-22 VITALS — BP 160/68 | HR 65 | Ht 66.0 in | Wt 154.9 lb

## 2013-07-22 DIAGNOSIS — I2510 Atherosclerotic heart disease of native coronary artery without angina pectoris: Secondary | ICD-10-CM

## 2013-07-22 DIAGNOSIS — I359 Nonrheumatic aortic valve disorder, unspecified: Secondary | ICD-10-CM

## 2013-07-22 DIAGNOSIS — I350 Nonrheumatic aortic (valve) stenosis: Secondary | ICD-10-CM

## 2013-07-22 DIAGNOSIS — E785 Hyperlipidemia, unspecified: Secondary | ICD-10-CM

## 2013-07-22 DIAGNOSIS — I10 Essential (primary) hypertension: Secondary | ICD-10-CM

## 2013-07-22 DIAGNOSIS — T82857A Stenosis of cardiac prosthetic devices, implants and grafts, initial encounter: Secondary | ICD-10-CM

## 2013-07-22 MED ORDER — HYDRALAZINE HCL 50 MG PO TABS: 50.0000 mg | ORAL_TABLET | Freq: Two times a day (BID) | ORAL | Status: DC

## 2013-07-22 NOTE — Patient Instructions (Addendum)
Change Hydralazine 50 mg twice a day  Your physician has requested that you have an echocardiogram. Echocardiography is a painless test that uses sound waves to create images of your heart. It provides your doctor with information about the size and shape of your heart and how well your heart's chambers and valves are working. This procedure takes approximately one hour. There are no restrictions for this procedure.  Sept 2014  Your physician wants you to follow-up in: 75 month Dr Herbie Baltimore.  You will receive a reminder letter in the mail two months in advance. If you don't receive a letter, please call our office to schedule the follow-up appointment.

## 2013-07-30 ENCOUNTER — Encounter: Payer: Self-pay | Admitting: Cardiology

## 2013-07-30 NOTE — Assessment & Plan Note (Signed)
She is on atorvastatin 40 L daily. Is followed by her PCP.

## 2013-07-30 NOTE — Progress Notes (Signed)
Patient ID: [name], female   DOB: Nov 09, 1936, 77 y.o.   MRN: [id number]  PCP: Estanislado Pandy, MD  Clinic Note: Chief Complaint  Patient presents with  . 8 months visit    no chestpain, no sob ,no edema ,had cataract sugery dec 2013 and jan 2014,IRON INFUSION in feb 2014   HPI: [name] is a 77 y.o. female with a PMH below who presents today for followup of her coronary artery disease. She is a long-term patient of Dr. Julieanne Manson dating back to 76 in the 4 where she underwent PTCA of the RCA, whom I saw for the first time in November of last year. At that time, my intention was to increase her hydralazine dose to 50 mg twice a day, however it was not done. We had decreased her Norvasc from 10-5 mg and her edema is much improved. She has been able to back off on her Lasix. Importantly: Her angina symptom was right-sided arm pain radiating to the left side of chest with chest discomfort and breathlessness.  Interval History: She returns today for eight-month followup. She denies any chest pain or shortness of breath with rest or exertion. 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 He denies any symptoms consistent with her anginal equivalent would recommend exertion. She denies any PND orthopnea. Her edema is significantly improved distended. She denies any lightheadedness, dizziness, wooziness or syncope/near-syncope. No TIA or amaurosis fugax symptoms. No nosebleeds. She does have diagnosis of iron deficiency anemia, she has never seen blood in her stools.  Past Medical History  Diagnosis Date  . Shortness of breath   . Hypertension   . Blood transfusion     " no reaction "  . GERD (gastroesophageal reflux disease)   . Myocardial infarction   . Diabetes mellitus     type 2  . History of: Acute coronary syndrome 11/28/2011    Dyspnea and Shoulder pain prior to adm   . Presence of stent in right coronary artery, HSRA  for ISR this admission 11/28/2011     midRCA PCI 1999 (NIR BMS 3.0 mm x 32 mm); distal  RCA BMS Sept 2012 (Vision BMS 2.5 mm x 15 mm), RCA ISR this adm Rx'd with HSRA 11/27/11   . Coronary stent restenosis due to progression of disease - Cutting Balloon PTCA to RCA stent 11/30/2011    Cutting Balloon PTCA  . Aortic stenosis, mild 11/28/2011    2D Echo Sept 2012 : Borderline LVH, EF greater than 55%. Mild to moderate TR, mild MR, very mild aortic stenosis but no regurgitation.  . Dyslipidemia 11/28/2011  . Diabetes mellitus 11/28/2011  . CAD S/P percutaneous coronary angioplasty -- PCI with EMS to RCA 1995, 1996    POBA /PTCA of mid RCA  . CAD S/P percutaneous coronary angioplasty 1999    NIR Primo BMS 3.0 mm 32 mm - mid RCA    Prior Cardiac Evaluation and Past Surgical History: Past Surgical History  Procedure Laterality Date  . Angioplasty  11/27/2011  . Tubal ligation    . Abdominal hysterectomy    . Breast biopsy      Allergies  Allergen Reactions  . Valium Nausea And Vomiting    Current Outpatient Prescriptions  Medication Sig Dispense Refill  . amLODipine (NORVASC) 10 MG tablet Take 1/2 tablet by mouth a daily      . Ascorbic Acid (VITAMIN C) 1000 MG tablet Take  1,000 mg by mouth daily.      Marland Kitchen aspirin EC 81 MG tablet Take 81 mg by mouth daily.        Marland Kitchen atenolol (TENORMIN) 50 MG tablet Take 50 mg by mouth 2 (two) times daily.        Marland Kitchen atorvastatin (LIPITOR) 40 MG tablet Take 40 mg by mouth at bedtime.        . benazepril (LOTENSIN) 40 MG tablet Take 40 mg by mouth daily.        . clopidogrel (PLAVIX) 75 MG tablet Take 75 mg by mouth daily.        . digoxin (LANOXIN) 0.25 MG tablet Take 0.25 mg by mouth daily.      . fish oil-omega-3 fatty acids 1000 MG capsule Take 1 g by mouth daily.        . folic acid (FOLVITE) 1 MG tablet Take 1 tablet (1 mg total) by mouth daily.  30 tablet  6  . furosemide (LASIX) 20 MG tablet Take 20 mg by mouth daily.       . iron polysaccharides  (NIFEREX) 150 MG capsule Take 150 mg by mouth 2 (two) times daily.        . metFORMIN (GLUCOPHAGE) 500 MG tablet Take 2 tablets (1,000 mg total) by mouth daily.      . pantoprazole (PROTONIX) 40 MG tablet Take 40 mg by mouth daily.        . valsartan (DIOVAN) 320 MG tablet Take 160 mg by mouth daily.        . vitamin B-12 (CYANOCOBALAMIN) 1000 MCG tablet Take 1,000 mcg by mouth daily.      . hydrALAZINE (APRESOLINE) 50 MG tablet Take 1 tablet (50 mg total) by mouth 2 (two) times daily.  60 tablet  11  . nitroGLYCERIN (NITROSTAT) 0.4 MG SL tablet Place 1 tablet (0.4 mg total) under the tongue every 5 (five) minutes as needed for chest pain.  25 tablet  2   No current facility-administered medications for this visit.    History   Social History  . Marital Status: Married    Spouse Name: N/A    Number of Children: N/A  . Years of Education: N/A   Occupational History  . Not on file.   Social History Main Topics  . Smoking status: Former Games developer  . Smokeless tobacco: Never Used     Comment: Quit approximately 60  . Alcohol Use: No  . Drug Use: No  . Sexually Active: Not Currently    Birth Control/ Protection: Post-menopausal   Other Topics Concern  . Not on file   Social History Narrative   She is a married mother of 3, grandmother to. 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 Quit smoking in '99, denies alcohol.   She volunteers for [employer].   ROS: A comprehensive Review of Systems - Negative except Pertinent positives noted above Ophthalmic ROS: positive for - blurry vision and Related cataracts Hematological and Lymphatic ROS: positive for - bruising, fatigue and Anemia Neurological ROS: no TIA or stroke symptoms negative for - gait disturbance, headaches, numbness/tingling, seizures or weakness  PHYSICAL EXAM BP 160/68  Pulse 65  Ht 5\' 6"  (1.676 m)  Wt 154 lb 14.4 oz (70.262 kg)  BMI 25.01 kg/m2 General appearance: A&Ox3, cooperative, appears younger  than stated age, no distress and Healthy-appearing. Well-nourished and well-groomed. Neck: no adenopathy, no carotid bruit, no JVD and supple, symmetrical, trachea midline Lungs: clear  to auscultation bilaterally, normal percussion bilaterally and nonlabored, good air movement Heart: RRR, normal S1-S2. 2/6 HSM along LSB, also 2/6 crescendo-decrescendo mid peaking SEM at RUSB radiating to carotids. Abdomen: soft, non-tender; bowel sounds normal; no masses,  no organomegaly and no HJR, mildly obese Extremities: extremities normal, atraumatic, no cyanosis or edema Pulses: 2+ and symmetric Neurologic: Grossly normal HEENT: [place]/AT, EOMI, MMM, anicteric sclera  [id number] today: Yes Rate:65 , Rhythm: NSR; LVH -- No significant change  Recent Labs: non recently  ASSESSMENT / PLAN:  Coronary stent restenosis due to progression of disease - Cutting Balloon PTCA to RCA stent Since that last intervention, she's had no further episodes of angina speak of.  LAN: Continue beta blocker with atenolol, lower dose of Norvasc to reduce the risk of edema, ACE inhibitor statin and aspirin. She is far enough out, however with recurrent restenosis of the vessel I would keep her on Plavix.  Aortic stenosis, mild She does have a murmur for aortic stenosis, no symptoms. Her last echo was done in 2012. She is due to have another followup to ensure there is no worsening gradients.  Plan: Followup in one year with 2-D echocardiogram before the visit, to reassess aortic valve disease  Hypertension Her blood pressure is not well-controlled today. She has not increased her hydralazine last visit. On an increased to 50 mg twice a day now. In the future we would consider probably using chlorthalidone for longer-term coverage which would allow [place] to take away the furosemide.  Dyslipidemia She is on atorvastatin 40 L daily. Is followed by her PCP.    Orders Placed This Encounter  Procedures  . EKG 12-Lead  . 2D  Echocardiogram without contrast    Standing Status: Future     Number of Occurrences:      Standing Expiration Date: 07/22/2014    Order Specific Question:  Type of Echo    Answer:  Complete    Order Specific Question:  Where should this test be performed    Answer:  [facility]    Order Specific Question:  Reason for exam-Echo    Answer:  Aortic Valve Disorder 424.1   Meds ordered this encounter  Medications  . hydrALAZINE (APRESOLINE) 50 MG tablet    Sig: Take 1 tablet (50 mg total) by mouth 2 (two) times daily.    Dispense:  60 tablet    Refill:  11    Followup: 12 months  Broedy Osbourne W. Herbie Baltimore, M.D., M.S. [facility] [address] [place], [place]  [address]  [phone] Pager # [phone]

## 2013-07-30 NOTE — Assessment & Plan Note (Signed)
Her blood pressure is not well-controlled today. She has not increased her hydralazine last visit. On an increased to 50 mg twice a day now. In the future we would consider probably using chlorthalidone for longer-term coverage which would allow [place] to take away the furosemide.

## 2013-07-30 NOTE — Assessment & Plan Note (Signed)
Since that last intervention, she's had no further episodes of angina speak of.  LAN: Continue beta blocker with atenolol, lower dose of Norvasc to reduce the risk of edema, ACE inhibitor statin and aspirin. She is far enough out, however with recurrent restenosis of the vessel I would keep her on Plavix.

## 2013-07-30 NOTE — Assessment & Plan Note (Signed)
She does have a murmur for aortic stenosis, no symptoms. Her last echo was done in 2012. She is due to have another followup to ensure there is no worsening gradients.  Plan: Followup in one year with 2-D echocardiogram before the visit, to reassess aortic valve disease

## 2013-08-15 ENCOUNTER — Other Ambulatory Visit: Payer: Self-pay | Admitting: *Deleted

## 2013-08-23 ENCOUNTER — Ambulatory Visit (HOSPITAL_COMMUNITY)
Admission: RE | Admit: 2013-08-23 | Discharge: 2013-08-23 | Disposition: A | Discharge status: Home / Self Care | Payer: Medicare Other | Source: Ambulatory Visit | Attending: Cardiovascular Disease | Admitting: Cardiovascular Disease

## 2013-08-23 DIAGNOSIS — I2510 Atherosclerotic heart disease of native coronary artery without angina pectoris: Secondary | ICD-10-CM | POA: Insufficient documentation

## 2013-08-23 DIAGNOSIS — I359 Nonrheumatic aortic valve disorder, unspecified: Secondary | ICD-10-CM | POA: Insufficient documentation

## 2013-08-23 DIAGNOSIS — I10 Essential (primary) hypertension: Secondary | ICD-10-CM | POA: Insufficient documentation

## 2013-08-23 DIAGNOSIS — I350 Nonrheumatic aortic (valve) stenosis: Secondary | ICD-10-CM

## 2013-08-23 DIAGNOSIS — E785 Hyperlipidemia, unspecified: Secondary | ICD-10-CM | POA: Insufficient documentation

## 2013-08-23 NOTE — Progress Notes (Signed)
2D Echo Performed 08/23/2013    Garris Melhorn, RCS  

## 2013-09-14 ENCOUNTER — Telehealth: Payer: Self-pay | Admitting: *Deleted

## 2013-09-14 NOTE — Telephone Encounter (Signed)
Message copied by Tobin Chad on Wed Sep 14, 2013  3:23 PM ------      Message from: [facility], DAVID      Created: Wed Aug 24, 2013 12:03 AM       Normal overall heart function, the aortic valve has very mild amount of narrowing. Not to be concerned about this time.      The heart itself has some abnormalities with relaxation/filling. This can lead to the the possibility of diastolic heart failure symptoms if you do a heart rate increases, or you have atrial fibrillation or another rhythm that changes how much time or has to relax. The way to treat this is to adequate control blood pressure and keep heart rate down. It does not mean you have diastolic heart failure syncope dysfunction.            Marykay Lex, MD ------

## 2013-09-14 NOTE — Telephone Encounter (Signed)
Spoke to patient. Result given . Verbalized understanding  

## 2013-10-31 ENCOUNTER — Other Ambulatory Visit: Payer: Self-pay | Admitting: *Deleted

## 2013-10-31 MED ORDER — CLOPIDOGREL BISULFATE 75 MG PO TABS: 75.0000 mg | ORAL_TABLET | Freq: Every day | ORAL | Status: DC

## 2013-11-16 ENCOUNTER — Other Ambulatory Visit: Payer: Self-pay | Admitting: *Deleted

## 2013-11-16 MED ORDER — PANTOPRAZOLE SODIUM 40 MG PO TBEC: 40.0000 mg | DELAYED_RELEASE_TABLET | Freq: Every day | ORAL | Status: DC

## 2013-11-16 NOTE — Telephone Encounter (Signed)
Rx was sent to pharmacy electronically. 

## 2014-04-13 ENCOUNTER — Other Ambulatory Visit: Payer: Self-pay | Admitting: Cardiology

## 2014-04-13 NOTE — Telephone Encounter (Signed)
Rx was sent to pharmacy electronically. 

## 2014-05-29 ENCOUNTER — Other Ambulatory Visit: Payer: Self-pay | Admitting: Cardiology

## 2014-05-29 NOTE — Telephone Encounter (Signed)
Rx was sent to pharmacy electronically. 

## 2014-08-31 ENCOUNTER — Ambulatory Visit (INDEPENDENT_AMBULATORY_CARE_PROVIDER_SITE_OTHER): Payer: Medicare Other | Admitting: Cardiology

## 2014-08-31 VITALS — BP 178/78 | HR 77 | Ht 65.5 in | Wt 163.7 lb

## 2014-08-31 DIAGNOSIS — I200 Unstable angina: Secondary | ICD-10-CM

## 2014-08-31 DIAGNOSIS — Z9861 Coronary angioplasty status: Secondary | ICD-10-CM

## 2014-08-31 DIAGNOSIS — I2510 Atherosclerotic heart disease of native coronary artery without angina pectoris: Secondary | ICD-10-CM

## 2014-08-31 DIAGNOSIS — I350 Nonrheumatic aortic (valve) stenosis: Secondary | ICD-10-CM

## 2014-08-31 DIAGNOSIS — I359 Nonrheumatic aortic valve disorder, unspecified: Secondary | ICD-10-CM

## 2014-08-31 DIAGNOSIS — I249 Acute ischemic heart disease, unspecified: Secondary | ICD-10-CM

## 2014-08-31 DIAGNOSIS — E785 Hyperlipidemia, unspecified: Secondary | ICD-10-CM

## 2014-08-31 DIAGNOSIS — I10 Essential (primary) hypertension: Secondary | ICD-10-CM

## 2014-08-31 MED ORDER — HYDRALAZINE HCL 100 MG PO TABS: 100.0000 mg | ORAL_TABLET | Freq: Two times a day (BID) | ORAL | Status: DC

## 2014-08-31 NOTE — Patient Instructions (Signed)
Your physician recommends that you schedule a follow-up appointment in: 1 Year  Your physician has recommended you make the following change in your medication: Stop Aspirin and Increase Hydralazine to 100 mg twice a day

## 2014-09-02 ENCOUNTER — Encounter: Payer: Self-pay | Admitting: Cardiology

## 2014-09-02 NOTE — Assessment & Plan Note (Addendum)
She still has blood pressure that is way too high. Unfortunately it looks like her Imodium was totally stopped as a result of edema.  Plan: Increase hydralazine to 100 mg twice a day. Next up would be adding a diuretic such as chlorthalidone. Could also change from atenolol to either carvedilol or Bystolic. She sees her PCP relatively frequently. I think that we've is to be managed by Dr. Neita Carp.

## 2014-09-02 NOTE — Assessment & Plan Note (Signed)
No symptoms to suggest recurrence of her anginal symptom of shoulder pain with dyspnea.

## 2014-09-02 NOTE — Assessment & Plan Note (Signed)
No longer on atorvastatin. This was stopped by her PCP due to cramping. Her last lipids look great, appears to see what they look like next time. If she does not tolerate Lipitor, and her lipids are worse, would try pravastatin to see if she tolerates that. Most insurance companies require going through a combination of statins prior to using the best one which is Crestor.

## 2014-09-02 NOTE — Assessment & Plan Note (Signed)
No active anginal symptoms. Remains on aspirin plus Plavix as well as beta blocker ACE inhibitor and ARB.  Plan: Stop aspirin continue Plavix

## 2014-09-02 NOTE — Assessment & Plan Note (Addendum)
Repeat echo was done in September of last year. Remains mild AS. Recheck in 2 years (3 years from last echo)

## 2014-09-02 NOTE — Progress Notes (Signed)
PCP: Estanislado Pandy, MD  Clinic Note: Chief Complaint  Patient presents with  . Follow-up    1 year follow-up, pt stated she's doing very well    HPI: [name] is a 78 y.o. female with a Cardiovascular Problem List below who presents today for her CAD & HTN. She is a long-term patient of Dr. Julieanne Manson dating back to 1999 when she underwent PTCA of the RCA for a NSTEMI,  Interval History: She presents today without really much to live any complaints. On occasion maybe less than once a month she'll have some flopping in her chest and some maybe rapid heartbeats that last less than a minute. She denies any dizziness, syncope/near-syncope with it. She has stopped her statin therapy because of feeling sore all over. Since stopping she feels better. Her last total cholesterol 158, triglycerides 133, HDL 52, LDL 71 - while still on statin. She is yet to check off that  She has mild edema for which he takes when necessary Lasix but has not had that much lately. She denies any resting or exertional dyspnea or angina. She does have mild bruising. She denies any PND, or orthopnea. No TIA or amaurosis fugax symptoms. No claudication.  Past Medical History  Diagnosis Date  . Myocardial infarction 1990s    Had PTCA then PCI on RCA  . History of: Acute coronary syndrome 11/28/2011    Dyspnea and Shoulder pain prior to adm   . CAD S/P percutaneous coronary angioplasty 1990s, 11/28/2011     midRCA PCI 1999 (NIR BMS 3.0 mm x 32 mm); distal  RCA BMS Sept 2012 (Vision BMS 2.5 mm x 15 mm); 12/02: RCA ISR --> Cutting PTCA   . Coronary stent restenosis due to progression of disease - Cutting Balloon PTCA to RCA stent 11/30/2011    Cutting Balloon PTCA  . Aortic stenosis, mild 08/2011; 08/2013    a) 2D Echo Sept 2012 : Borderline LVH, EF greater than 55%. Mild to moderate TR, mild MR, very mild AS w/o AI;;b)  Echo 08/2013 Normal LV size and function. EF 65-70%. Grd 2 DD, mild AS, mild MR  .  Dyslipidemia, goal LDL below 70 11/28/2011  . Diabetes mellitus type 2, controlled, without complications   . Essential hypertension     On ACE inhibitor and ARB? As well as atenolol and hydralazine  . History of blood transfusion     without reaction  . GERD (gastroesophageal reflux disease)    Prior Cardiac Evaluation and Past Surgical History: Past Surgical History  Procedure Laterality Date  . Tubal ligation    . Abdominal hysterectomy    . Breast biopsy    . Percutaneous coronary stent intervention (pci-s)  1999    Mid RCA - NIR DMS 3.0 mm x 30 mm, dRCA Vision BMS 2.5 mm x 15 mm   . Coronary angioplasty  11/2006    Cutting Balloon of stent in the RCA  . Transthoracic echocardiogram  September 2014    Normal LV size and function. EF 65-70%. Grd 2 DD, mild AS, mild MR   MEDICATIONS AND ALLERGIES REVIEWED IN EPIC No Change in Social and Family History  ROS: A comprehensive Review of Systems - was performed Review of Systems  Constitutional:       Normal  HENT: Negative for congestion and nosebleeds.   Eyes: Negative for blurred vision.  Respiratory: Negative for cough, shortness of breath and wheezing.   Cardiovascular: Positive for palpitations.  Gastrointestinal: Negative  for constipation, blood in stool and melena.  Genitourinary: Negative for frequency.  Musculoskeletal:       Less myalgias off of statin  Neurological: Negative for dizziness, sensory change, speech change, focal weakness, seizures, loss of consciousness and headaches.  Endo/Heme/Allergies: Bruises/bleeds easily.  All other systems reviewed and are negative.  Wt Readings from Last 3 Encounters:  08/31/14 163 lb 11.2 oz (74.254 kg)  07/22/13 154 lb 14.4 oz (70.262 kg)  11/28/11 153 lb 10.6 oz (69.7 kg)   PHYSICAL EXAM BP 178/78  Pulse 77  Ht 5' 5.5" (1.664 m)  Wt 163 lb 11.2 oz (74.254 kg)  BMI 26.82 kg/m2 General appearance: A&Ox3, cooperative, appears younger than stated age, no distress and  Healthy-appearing. Well-nourished and well-groomed.  Neck: no adenopathy, no carotid bruit, no JVD and supple, symmetrical, trachea midline  Lungs: clear to auscultation bilaterally, normal percussion bilaterally and nonlabored, good air movement  Heart: RRR, normal S1-S2. 2/6 HSM along LSB, also 2/6 crescendo-decrescendo mid peaking SEM at RUSB radiating to carotids.  Abdomen: soft, non-tender; bowel sounds normal; no masses, no organomegaly and no HJR, mildly obese  Extremities: extremities normal, atraumatic, no cyanosis or edema  Pulses: 2+ and symmetric  Neurologic: Grossly normal  HEENT: [place]/AT, EOMI, MMM, anicteric sclera   Adult ECG Report  Rate: 72 ;  Rhythm: normal sinus rhythm, premature ventricular contractions (PVC) and Inferolateral TWI - cannot exclude ischemia vs. repolarization changes from LVH  Narrative Interpretation: Stable EKG  Recent Labs recently checked by PCP:   TC 158, TG 173, HDL 52, LDL 71 - while still on statin  ASSESSMENT / PLAN: CAD S/P percutaneous coronary angioplasty -- PCI with BMS to RCA followed by POBA of ISR No active anginal symptoms. Remains on aspirin plus Plavix as well as beta blocker ACE inhibitor and ARB.  Plan: Stop aspirin continue Plavix  Moderate essential hypertension She still has blood pressure that is way too high. Unfortunately it looks like her Imodium was totally stopped as a result of edema.  Plan: Increase hydralazine to 100 mg twice a day. Next up would be adding a diuretic such as chlorthalidone. Could also change from atenolol to either carvedilol or Bystolic. She sees her PCP relatively frequently. I think that we've is to be managed by Dr. Neita Carp.  History of: Acute coronary syndrome No symptoms to suggest recurrence of her anginal symptom of shoulder pain with dyspnea.  Aortic stenosis, mild Repeat echo was done in September of last year. Remains mild AS. Recheck in 2 years (3 years from last echo)  Dyslipidemia,  goal LDL below 70 No longer on atorvastatin. This was stopped by her PCP due to cramping. Her last lipids look great, appears to see what they look like next time. If she does not tolerate Lipitor, and her lipids are worse, would try pravastatin to see if she tolerates that. Most insurance companies require going through a combination of statins prior to using the best one which is Crestor.    Orders Placed This Encounter  Procedures  . EKG 12-Lead   Meds ordered this encounter  Medications  . metFORMIN (GLUCOPHAGE) 500 MG tablet    Sig: Take 1,000 mg by mouth 2 (two) times daily with a meal.  . hydrALAZINE (APRESOLINE) 100 MG tablet    Sig: Take 1 tablet (100 mg total) by mouth 2 (two) times daily.    Dispense:  60 tablet    Refill:  11    Followup: One month  Marykay Lex, M.D., M.S. Interventional Cardiologist   Pager # [phone]

## 2014-09-18 ENCOUNTER — Other Ambulatory Visit: Payer: Self-pay | Admitting: Cardiology

## 2014-09-18 NOTE — Telephone Encounter (Signed)
Rx was sent to pharmacy electronically. 

## 2014-11-29 ENCOUNTER — Encounter (HOSPITAL_COMMUNITY): Payer: Self-pay | Admitting: Cardiology

## 2014-12-27 ENCOUNTER — Other Ambulatory Visit: Payer: Self-pay | Admitting: Cardiology

## 2014-12-27 NOTE — Telephone Encounter (Signed)
Rx(s) sent to pharmacy electronically.  

## 2015-02-19 ENCOUNTER — Telehealth: Payer: Self-pay | Admitting: Cardiology

## 2015-02-19 NOTE — Telephone Encounter (Signed)
No answer  will contact patient tomorrow  Dr Herbie BaltimoreHarding has information to address clearance

## 2015-02-19 NOTE — Telephone Encounter (Signed)
Message routed to Dr. Herbie BaltimoreHarding & Jasmine DecemberSharon, RN to review and advise on clearance

## 2015-02-19 NOTE — Telephone Encounter (Signed)
Pt wants to know if you received clarence fax from Dr Weldon Inchestoole. He wants her to stop her Plavix for 5 to 7 days,she needs a back injection.

## 2015-02-20 ENCOUNTER — Telehealth: Payer: Self-pay | Admitting: *Deleted

## 2015-02-20 NOTE — Telephone Encounter (Signed)
No answer  Concerning  Clearance to hold plavix 5-7 days  For procedure

## 2015-02-20 NOTE — Telephone Encounter (Signed)
Faxed [facility] to hold plavix 5-7 days prior to all procedures per Dr Herbie BaltimoreHarding.

## 2015-02-20 NOTE — Telephone Encounter (Signed)
PROCEDURE WIIL BE DONE BY Dr Laurian Brim'TOOLE

## 2015-02-26 NOTE — Telephone Encounter (Signed)
OK to hold Plavix for 5-7 days.  DH

## 2015-02-26 NOTE — Telephone Encounter (Signed)
Tobin ChadSharon V. Martin, RN at 02/20/2015 11:32 AM     Status: Signed       Expand All Collapse All   Faxed clearance [facility] to hold plavix 5-7 days prior to all procedures per Dr Herbie BaltimoreHarding.       Called patient and notified her of information. She voiced understanding.   Clearance was sent on 3/1

## 2015-08-03 ENCOUNTER — Encounter: Payer: Self-pay | Admitting: *Deleted

## 2015-08-08 ENCOUNTER — Other Ambulatory Visit: Payer: Self-pay | Admitting: Cardiology

## 2015-08-08 NOTE — Telephone Encounter (Signed)
Rx(s) sent to pharmacy electronically.  

## 2015-08-17 ENCOUNTER — Other Ambulatory Visit: Payer: Self-pay | Admitting: Cardiology

## 2015-08-17 NOTE — Telephone Encounter (Signed)
Rx request sent to pharmacy.  

## 2015-08-23 ENCOUNTER — Ambulatory Visit (INDEPENDENT_AMBULATORY_CARE_PROVIDER_SITE_OTHER): Payer: Medicare Other | Admitting: Cardiology

## 2015-08-23 VITALS — BP 172/70 | HR 60 | Ht 66.0 in | Wt 152.4 lb

## 2015-08-23 DIAGNOSIS — T82857S Stenosis of cardiac prosthetic devices, implants and grafts, sequela: Secondary | ICD-10-CM

## 2015-08-23 DIAGNOSIS — I10 Essential (primary) hypertension: Secondary | ICD-10-CM

## 2015-08-23 DIAGNOSIS — E785 Hyperlipidemia, unspecified: Secondary | ICD-10-CM

## 2015-08-23 DIAGNOSIS — I350 Nonrheumatic aortic (valve) stenosis: Secondary | ICD-10-CM

## 2015-08-23 DIAGNOSIS — I249 Acute ischemic heart disease, unspecified: Secondary | ICD-10-CM | POA: Diagnosis not present

## 2015-08-23 DIAGNOSIS — I2510 Atherosclerotic heart disease of native coronary artery without angina pectoris: Secondary | ICD-10-CM | POA: Diagnosis not present

## 2015-08-23 DIAGNOSIS — Z955 Presence of coronary angioplasty implant and graft: Secondary | ICD-10-CM

## 2015-08-23 DIAGNOSIS — Z9861 Coronary angioplasty status: Secondary | ICD-10-CM

## 2015-08-23 MED ORDER — AMLODIPINE BESYLATE 5 MG PO TABS: 5.0000 mg | ORAL_TABLET | Freq: Every day | ORAL | Status: DC

## 2015-08-23 NOTE — Patient Instructions (Signed)
Your physician wants you to follow-up in: 1 Year. You will receive a reminder letter in the mail two months in advance. If you don't receive a letter, please call our office to schedule the follow-up appointment.  Your physician has recommended you make the following change in your medication: Increase Amlodipine 5 mg daily   

## 2015-08-23 NOTE — Progress Notes (Signed)
PCP: Estanislado Pandy, MD  Clinic Note: Chief Complaint  Patient presents with  . Annual Exam    pt c/o lightheadedness when she stands up/ states no other Sx.    HPI: [name] is a 79 y.o. female with a PMH below who presents today for 1 year f/u of her CAD-PCI RCA in 1999 & 08/2011, PTCA of ISR  in 11/2011.Marland Kitchen She is a long-term patient of Dr. Julieanne Manson dating back to 1999 when she underwent PTCA of the RCA for a NSTEMI,   MARY SECORD was last seen in Sept 2015.  We discontinued aspirin. Amlodipine had been stopped due to her edema. Now restored.  Recent Hospitalizations: n/a  Studies Reviewed: n/a  Interval History: she presents today pretty well. She states that she has rare episodes of chest discomfort but no shortness of breath. She says occasionally she'll a little "irregularity" in her heart rate, but nothing significant. Most lesions and is affected for [place] her physical activity by her back pain. It hurts quite a bit and limits her activity. She has been getting injections that helped some but not fully. Part as result of this, she is noting some exertional dyspnea.   She has mild lower extremity edema, no PND, orthopnea or other cardiac symptoms. No significant/typical anginal chest pain or shortness of breath with rest or exertion.  No palpitations,weakness or syncope/near syncope.  Mild positional/orthostatic lightheadedness. No TIA/amaurosis fugax symptoms. No melena, hematochezia, hematuria, or epstaxis. No claudication.  Past Medical History  Diagnosis Date  . Myocardial infarction 1990s    Had PTCA then PCI on RCA  . History of: Acute coronary syndrome 11/28/2011    Dyspnea and Shoulder pain prior to adm   . CAD S/P percutaneous coronary angioplasty 1990s, 11/28/2011     midRCA PCI 1999 (NIR BMS 3.0 mm x 32 mm); distal  RCA BMS Sept 2012 (Vision BMS 2.5 mm x 15 mm); 12/02: RCA ISR --> Cutting PTCA   . Coronary stent restenosis due to progression of  disease - Cutting Balloon PTCA to RCA stent 11/30/2011    Cutting Balloon PTCA  . Aortic stenosis, mild 08/2011; 08/2013    a) 2D Echo Sept 2012 : Borderline LVH, EF greater than 55%. Mild to moderate TR, mild MR, very mild AS w/o AI;;b)  Echo 08/2013 Normal LV size and function. EF 65-70%. Grd 2 DD, mild AS, mild MR  . Dyslipidemia, goal LDL below 70 11/28/2011  . Diabetes mellitus type 2, controlled, without complications   . Essential hypertension     On ACE inhibitor and ARB? As well as atenolol and hydralazine  . History of blood transfusion     without reaction  . GERD (gastroesophageal reflux disease)   . H/O echocardiogram 08/2011    EF ->55%  . History of nuclear stress test 2008    low risk scan    ROS: A comprehensive was performed. Review of Systems  Constitutional: Positive for malaise/fatigue (Overall just poor energy.).  HENT: Positive for nosebleeds.   Cardiovascular: Positive for leg swelling. Negative for claudication.  Musculoskeletal: Positive for back pain.  Endo/Heme/Allergies: Does not bruise/bleed easily.  Psychiatric/Behavioral: Negative for depression.  All other systems reviewed and are negative.   Past Surgical History  Procedure Laterality Date  . Tubal ligation    . Abdominal hysterectomy    . Breast biopsy    . Percutaneous coronary stent intervention (pci-s)  1999    Mid RCA - NIR DMS 3.0  mm x 30 mm, dRCA Vision BMS 2.5 mm x 15 mm   . Coronary angioplasty  11/2006    Cutting Balloon of stent in the RCA  . Transthoracic echocardiogram  September 2014    Normal LV size and function. EF 65-70%. Grd 2 DD, mild AS, mild MR  . Left heart catheterization with coronary angiogram N/A 11/27/2011    Procedure: LEFT HEART CATHETERIZATION WITH CORONARY ANGIOGRAM;  Surgeon: Marykay Lex, MD;  Location: [facility] CATH LAB;  Service: Cardiovascular;  Laterality: N/A;  . Cardiac catheterization  2012    performed for angina  . Cardiac catheterization      Prior to  Admission medications   Medication Sig Start Date End Date Taking? Authorizing Provider  amLODipine (NORVASC) 2.5 MG tablet Take 1 tablet by mouth daily. 07/30/15  Yes Historical Provider, MD  atenolol (TENORMIN) 50 MG tablet Take 50 mg by mouth 2 (two) times daily.     Yes Historical Provider, MD  atorvastatin (LIPITOR) 40 MG tablet Take 40 mg by mouth at bedtime.     Yes Historical Provider, MD  benazepril (LOTENSIN) 40 MG tablet Take 40 mg by mouth daily.     Yes Historical Provider, MD  clopidogrel (PLAVIX) 75 MG tablet Take 1 tablet (75 mg total) by mouth daily. 12/27/14  Yes Marykay Lex, MD  furosemide (LASIX) 20 MG tablet Take 20 mg by mouth as needed.    Yes Historical Provider, MD  hydrALAZINE (APRESOLINE) 100 MG tablet TAKE ONE TABLET BY MOUTH TWICE DAILY 08/08/15  Yes Marykay Lex, MD  metFORMIN (GLUCOPHAGE-XR) 500 MG 24 hr tablet Take 500 mg by mouth daily. 08/17/15  Yes Historical Provider, MD  pantoprazole (PROTONIX) 40 MG tablet TAKE ONE (1) TABLET EACH DAY 08/17/15  Yes Marykay Lex, MD  traMADol (ULTRAM) 50 MG tablet Take 1 tablet by mouth as needed. 05/30/15  Yes Historical Provider, MD  valsartan (DIOVAN) 320 MG tablet Take 320 mg by mouth daily.    Yes Historical Provider, MD  vitamin B-12 (CYANOCOBALAMIN) 1000 MCG tablet Take 1,000 mcg by mouth daily.   Yes Historical Provider, MD  nitroGLYCERIN (NITROSTAT) 0.4 MG SL tablet Place 1 tablet (0.4 mg total) under the tongue every 5 (five) minutes as needed for chest pain. 11/28/11 08/31/14  Abelino Derrick, PA-C   Allergies  Allergen Reactions  . Valium Nausea And Vomiting     Social History   Social History  . Marital Status: Married    Spouse Name: N/A  . Number of Children: N/A  . Years of Education: N/A   Social History Main Topics  . Smoking status: Former Games developer  . Smokeless tobacco: Never Used     Comment: Quit approximately 59  . Alcohol Use: No  . Drug Use: No  . Sexual Activity: Not Currently    Birth  Control/ Protection: Post-menopausal   Other Topics Concern  . None   Social History Narrative   She is a married mother of 3, grandmother to. 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 Quit smoking in '99, denies alcohol.   She volunteers for [employer].   Family History  Problem Relation Age of Onset  . Cancer Mother   . Heart disease Father   . Heart disease Maternal Grandmother   . Heart disease Maternal Grandfather   . Heart disease Brother   . Nephrolithiasis Brother     accidental death  . Heart Problems Brother  CABG    Wt Readings from Last 3 Encounters:  08/23/15 152 lb 6.4 oz (69.128 kg)  08/31/14 163 lb 11.2 oz (74.254 kg)  07/22/13 154 lb 14.4 oz (70.262 kg)    PHYSICAL EXAM BP 172/70 mmHg  Pulse 60  Ht  (1.676 m)  Wt 152 lb 6.4 oz (69.128 kg)  BMI 24.61 kg/m2 General appearance: A&Ox3, cooperative, appears younger than stated age, no distress and Healthy-appearing. Well-nourished and well-groomed.  HEENT: [place]/AT, EOMI, MMM, anicteric sclera  Neck: no adenopathy, soft left carotid bruit, no JVD and supple, symmetrical, trachea midline  Lungs: clear to auscultation bilaterally, normal percussion bilaterally and nonlabored, good air movement  Heart: RRR, normal S1-S2. 2/6 HSM along LSB, also 2/6 crescendo-decrescendo mid peaking SEM at RUSB radiating to carotids.  Abdomen: soft, non-tender; bowel sounds normal; no masses, no organomegaly and no HJR, mildly obese  Extremities: extremities normal, atraumatic, no cyanosis or edema  Pulses: 2+ and symmetric  Neurologic: Grossly normal     Adult ECG Report  Rate: 60 ;  Rhythm: normal sinus rhythm;   Narrative Interpretation: normal EKG (normal axis, intervals, durations)   Other studies Reviewed: Additional studies/ records that were reviewed today include:  Recent Labs:  None available == followed by PCP     ASSESSMENT / PLAN: Problem List Items Addressed This Visit    Aortic  stenosis, mild (Chronic)    Had been stable by echo last year. Mild left ear only. Okay to recheck next year.      Relevant Medications   amLODipine (NORVASC) 5 MG tablet   CAD S/P percutaneous coronary angioplasty -- PCI with BMS to RCA followed by POBA of ISR (Chronic)    No active anginal symptoms. On statin, beta blocker and ARB along with amlodipine which increasing to 5 mg. On Plavix alone without aspirin. No change      Relevant Medications   amLODipine (NORVASC) 5 MG tablet   Coronary stent restenosis due to progression of disease (Chronic)    She PTCA of acute in-stent re-stenosis -- no further episodes. On BB, Plavix & Amlodipine + statin      Dyslipidemia, goal LDL below 70 (Chronic)    Appears to be back on atorvastatin according to her med list. Labs have been followed by PCP. I don't have them available.      Relevant Medications   amLODipine (NORVASC) 5 MG tablet   History of: Acute coronary syndrome (Chronic)    No recurrent symptoms to suggest unstable angina or progressive angina which in her case was shoulder pain plus dyspnea.      Relevant Medications   amLODipine (NORVASC) 5 MG tablet   Moderate essential hypertension (Chronic)    Still difficult to control. Last visit we increased hydralazine to 100 mg. She is on max dose of valsartan. She is on an ACE inhibitor as well as beta blocker and amlodipine. Increase amlodipine to 5 mg daily. She has edema, she may need to use furosemide.      Relevant Medications   amLODipine (NORVASC) 5 MG tablet   Presence of stent in right coronary artery, HSRA for ISR this admission (Chronic)    Other Visit Diagnoses    Essential hypertension    -  Primary    Relevant Medications    amLODipine (NORVASC) 5 MG tablet    Other Relevant Orders    EKG 12-Lead (Completed)       Current medicines are reviewed at length with the patient  today. (+/- concerns) n/a The following changes have been made: Increase  Amlodipine to 5 mg daily Studies Ordered:   Orders Placed This Encounter  Procedures  . EKG 12-Lead    ROV 1 yr   Marykay Lex, M.D., M.S. Interventional Cardiologist   Pager # [phone]

## 2015-08-25 ENCOUNTER — Encounter: Payer: Self-pay | Admitting: Cardiology

## 2015-08-25 NOTE — Assessment & Plan Note (Signed)
Appears to be back on atorvastatin according to her med list. Labs have been followed by PCP. I don't have them available.

## 2015-08-25 NOTE — Assessment & Plan Note (Signed)
No active anginal symptoms. On statin, beta blocker and ARB along with amlodipine which increasing to 5 mg. On Plavix alone without aspirin. No change

## 2015-08-25 NOTE — Assessment & Plan Note (Signed)
Had been stable by echo last year. Mild left ear only. Okay to recheck next year.

## 2015-08-25 NOTE — Assessment & Plan Note (Signed)
No recurrent symptoms to suggest unstable angina or progressive angina which in her case was shoulder pain plus dyspnea.

## 2015-08-25 NOTE — Assessment & Plan Note (Signed)
She PTCA of acute in-stent re-stenosis -- no further episodes. On BB, Plavix & Amlodipine + statin

## 2015-08-25 NOTE — Assessment & Plan Note (Signed)
Still difficult to control. Last visit we increased hydralazine to 100 mg. She is on max dose of valsartan. She is on an ACE inhibitor as well as beta blocker and amlodipine. Increase amlodipine to 5 mg daily. She has edema, she may need to use furosemide.

## 2015-08-28 ENCOUNTER — Encounter: Payer: Self-pay | Admitting: Cardiology

## 2015-09-21 ENCOUNTER — Other Ambulatory Visit: Payer: Self-pay | Admitting: Cardiology

## 2015-09-21 NOTE — Telephone Encounter (Signed)
Rx(s) sent to pharmacy electronically.  

## 2015-10-08 ENCOUNTER — Other Ambulatory Visit: Payer: Self-pay | Admitting: Cardiology

## 2015-10-08 NOTE — Telephone Encounter (Signed)
Rx request sent to pharmacy.  

## 2015-10-16 ENCOUNTER — Other Ambulatory Visit: Payer: Self-pay | Admitting: Cardiology

## 2015-10-16 NOTE — Telephone Encounter (Signed)
E sent to pharmacy 

## 2015-11-12 ENCOUNTER — Telehealth: Payer: Self-pay | Admitting: Cardiology

## 2015-11-12 NOTE — Telephone Encounter (Signed)
Ok to hold Plavix 7 days prior to procedure.  Marykay LexHARDING, [place] W, MD

## 2015-11-12 NOTE — Telephone Encounter (Signed)
1. What  office are you calling from? [facility] in [place]  2. What is your office phone and fax number? Fax- [phone] telephone- [phone]  3. What type of procedure is the patient having performed? Spinal injection   4. What date is procedure scheduled? Pending   5. What is your question (ex. Antibiotics prior to procedure, holding medication-we need to know how long dentist wants pt to hold med)? Can she hold her Plavix for 7 days 6.

## 2015-11-12 NOTE — Telephone Encounter (Signed)
Pt on Plavix. Recently seen in office -- Had 1 year f/u for CAD w/ PCI in September 2016.  Neurology specialist seeking clearing for interruption of Plavix - see notes below  Routed to Dr. Herbie BaltimoreHarding.

## 2015-11-13 ENCOUNTER — Encounter: Payer: Self-pay | Admitting: *Deleted

## 2015-11-13 NOTE — Telephone Encounter (Signed)
Pt notified, e-fax sent to pain management center.

## 2015-11-19 ENCOUNTER — Telehealth: Payer: Self-pay | Admitting: Cardiology

## 2015-11-19 NOTE — Telephone Encounter (Signed)
Electronic fax resubmitted.

## 2015-11-19 NOTE — Telephone Encounter (Signed)
Would you please refax clearance that was sent on 11-12-15. Please fax to [phone] Att: Carollee HerterShannon

## 2015-12-26 DIAGNOSIS — M47817 Spondylosis without myelopathy or radiculopathy, lumbosacral region: Secondary | ICD-10-CM | POA: Diagnosis not present

## 2015-12-26 DIAGNOSIS — M5136 Other intervertebral disc degeneration, lumbar region: Secondary | ICD-10-CM | POA: Diagnosis not present

## 2015-12-26 DIAGNOSIS — M545 Low back pain: Secondary | ICD-10-CM | POA: Diagnosis not present

## 2015-12-26 DIAGNOSIS — M9983 Other biomechanical lesions of lumbar region: Secondary | ICD-10-CM | POA: Diagnosis not present

## 2016-01-14 DIAGNOSIS — Z79899 Other long term (current) drug therapy: Secondary | ICD-10-CM | POA: Diagnosis not present

## 2016-01-14 DIAGNOSIS — I2510 Atherosclerotic heart disease of native coronary artery without angina pectoris: Secondary | ICD-10-CM | POA: Diagnosis not present

## 2016-01-14 DIAGNOSIS — E785 Hyperlipidemia, unspecified: Secondary | ICD-10-CM | POA: Diagnosis not present

## 2016-01-14 DIAGNOSIS — M47817 Spondylosis without myelopathy or radiculopathy, lumbosacral region: Secondary | ICD-10-CM | POA: Diagnosis not present

## 2016-01-14 DIAGNOSIS — Z7902 Long term (current) use of antithrombotics/antiplatelets: Secondary | ICD-10-CM | POA: Diagnosis not present

## 2016-01-14 DIAGNOSIS — M9983 Other biomechanical lesions of lumbar region: Secondary | ICD-10-CM | POA: Diagnosis not present

## 2016-01-14 DIAGNOSIS — M545 Low back pain: Secondary | ICD-10-CM | POA: Diagnosis not present

## 2016-01-14 DIAGNOSIS — I10 Essential (primary) hypertension: Secondary | ICD-10-CM | POA: Diagnosis not present

## 2016-01-14 DIAGNOSIS — Z888 Allergy status to other drugs, medicaments and biological substances status: Secondary | ICD-10-CM | POA: Diagnosis not present

## 2016-01-14 DIAGNOSIS — K219 Gastro-esophageal reflux disease without esophagitis: Secondary | ICD-10-CM | POA: Diagnosis not present

## 2016-01-14 DIAGNOSIS — Z7984 Long term (current) use of oral hypoglycemic drugs: Secondary | ICD-10-CM | POA: Diagnosis not present

## 2016-01-14 DIAGNOSIS — Z955 Presence of coronary angioplasty implant and graft: Secondary | ICD-10-CM | POA: Diagnosis not present

## 2016-01-14 DIAGNOSIS — M7138 Other bursal cyst, other site: Secondary | ICD-10-CM | POA: Diagnosis not present

## 2016-01-14 DIAGNOSIS — M47816 Spondylosis without myelopathy or radiculopathy, lumbar region: Secondary | ICD-10-CM | POA: Diagnosis not present

## 2016-01-14 DIAGNOSIS — E119 Type 2 diabetes mellitus without complications: Secondary | ICD-10-CM | POA: Diagnosis not present

## 2016-01-14 DIAGNOSIS — M5136 Other intervertebral disc degeneration, lumbar region: Secondary | ICD-10-CM | POA: Diagnosis not present

## 2016-01-24 ENCOUNTER — Telehealth: Payer: Self-pay | Admitting: *Deleted

## 2016-01-24 NOTE — Telephone Encounter (Signed)
Request for surgical clearance:  1. What type of surgery is being performed? Second facet joint injectio  2. When is this surgery scheduled?  Not schedule  3. Are there any medications that need to be held prior to surgery and how long? plavix 75 mg  For 5-7 days  4. Name of physician performing surgery?  [facility]  5.   6. What is your office phone and fax number?   Fax [phone]-- phone [phone]  7.

## 2016-01-29 NOTE — Telephone Encounter (Signed)
OK to stop Plavix 7 days pre-procedure. Restart ~2-3 days after.  Marykay Lex, MD

## 2016-01-30 NOTE — Telephone Encounter (Signed)
Routed to [facility]

## 2016-02-11 DIAGNOSIS — M47816 Spondylosis without myelopathy or radiculopathy, lumbar region: Secondary | ICD-10-CM | POA: Diagnosis not present

## 2016-02-11 DIAGNOSIS — E119 Type 2 diabetes mellitus without complications: Secondary | ICD-10-CM | POA: Diagnosis not present

## 2016-02-11 DIAGNOSIS — Z7902 Long term (current) use of antithrombotics/antiplatelets: Secondary | ICD-10-CM | POA: Diagnosis not present

## 2016-02-11 DIAGNOSIS — Z888 Allergy status to other drugs, medicaments and biological substances status: Secondary | ICD-10-CM | POA: Diagnosis not present

## 2016-02-11 DIAGNOSIS — E785 Hyperlipidemia, unspecified: Secondary | ICD-10-CM | POA: Diagnosis not present

## 2016-02-11 DIAGNOSIS — Z955 Presence of coronary angioplasty implant and graft: Secondary | ICD-10-CM | POA: Diagnosis not present

## 2016-02-11 DIAGNOSIS — Z79899 Other long term (current) drug therapy: Secondary | ICD-10-CM | POA: Diagnosis not present

## 2016-02-11 DIAGNOSIS — M47817 Spondylosis without myelopathy or radiculopathy, lumbosacral region: Secondary | ICD-10-CM | POA: Diagnosis not present

## 2016-02-11 DIAGNOSIS — I10 Essential (primary) hypertension: Secondary | ICD-10-CM | POA: Diagnosis not present

## 2016-02-11 DIAGNOSIS — M545 Low back pain: Secondary | ICD-10-CM | POA: Diagnosis not present

## 2016-02-11 DIAGNOSIS — I2510 Atherosclerotic heart disease of native coronary artery without angina pectoris: Secondary | ICD-10-CM | POA: Diagnosis not present

## 2016-02-11 DIAGNOSIS — Z7984 Long term (current) use of oral hypoglycemic drugs: Secondary | ICD-10-CM | POA: Diagnosis not present

## 2016-02-11 DIAGNOSIS — K219 Gastro-esophageal reflux disease without esophagitis: Secondary | ICD-10-CM | POA: Diagnosis not present

## 2016-02-19 DIAGNOSIS — E7800 Pure hypercholesterolemia, unspecified: Secondary | ICD-10-CM | POA: Diagnosis not present

## 2016-02-19 DIAGNOSIS — E1165 Type 2 diabetes mellitus with hyperglycemia: Secondary | ICD-10-CM | POA: Diagnosis not present

## 2016-02-19 DIAGNOSIS — R5382 Chronic fatigue, unspecified: Secondary | ICD-10-CM | POA: Diagnosis not present

## 2016-02-19 DIAGNOSIS — E782 Mixed hyperlipidemia: Secondary | ICD-10-CM | POA: Diagnosis not present

## 2016-02-19 DIAGNOSIS — I10 Essential (primary) hypertension: Secondary | ICD-10-CM | POA: Diagnosis not present

## 2016-02-21 ENCOUNTER — Telehealth: Payer: Self-pay | Admitting: *Deleted

## 2016-02-21 NOTE — Telephone Encounter (Signed)
Left message( stephanie - receptionist)  to call back   question if patient needs another  Clearance for radio frequency dated  On 02/14/16  To be done in the future

## 2016-02-26 DIAGNOSIS — E782 Mixed hyperlipidemia: Secondary | ICD-10-CM | POA: Diagnosis not present

## 2016-02-26 DIAGNOSIS — I10 Essential (primary) hypertension: Secondary | ICD-10-CM | POA: Diagnosis not present

## 2016-02-26 DIAGNOSIS — M545 Low back pain: Secondary | ICD-10-CM | POA: Diagnosis not present

## 2016-02-26 DIAGNOSIS — R5383 Other fatigue: Secondary | ICD-10-CM | POA: Diagnosis not present

## 2016-02-26 DIAGNOSIS — E119 Type 2 diabetes mellitus without complications: Secondary | ICD-10-CM | POA: Diagnosis not present

## 2016-03-03 DIAGNOSIS — M47817 Spondylosis without myelopathy or radiculopathy, lumbosacral region: Secondary | ICD-10-CM | POA: Diagnosis not present

## 2016-03-03 DIAGNOSIS — I10 Essential (primary) hypertension: Secondary | ICD-10-CM | POA: Diagnosis not present

## 2016-03-03 DIAGNOSIS — Z90710 Acquired absence of both cervix and uterus: Secondary | ICD-10-CM | POA: Diagnosis not present

## 2016-03-03 DIAGNOSIS — E119 Type 2 diabetes mellitus without complications: Secondary | ICD-10-CM | POA: Diagnosis not present

## 2016-03-03 DIAGNOSIS — M47816 Spondylosis without myelopathy or radiculopathy, lumbar region: Secondary | ICD-10-CM | POA: Diagnosis not present

## 2016-03-03 DIAGNOSIS — Z7984 Long term (current) use of oral hypoglycemic drugs: Secondary | ICD-10-CM | POA: Diagnosis not present

## 2016-03-03 DIAGNOSIS — I2510 Atherosclerotic heart disease of native coronary artery without angina pectoris: Secondary | ICD-10-CM | POA: Diagnosis not present

## 2016-03-03 DIAGNOSIS — Z79899 Other long term (current) drug therapy: Secondary | ICD-10-CM | POA: Diagnosis not present

## 2016-03-03 DIAGNOSIS — Z888 Allergy status to other drugs, medicaments and biological substances status: Secondary | ICD-10-CM | POA: Diagnosis not present

## 2016-03-03 DIAGNOSIS — M545 Low back pain: Secondary | ICD-10-CM | POA: Diagnosis not present

## 2016-03-03 DIAGNOSIS — Z7902 Long term (current) use of antithrombotics/antiplatelets: Secondary | ICD-10-CM | POA: Diagnosis not present

## 2016-03-03 DIAGNOSIS — K219 Gastro-esophageal reflux disease without esophagitis: Secondary | ICD-10-CM | POA: Diagnosis not present

## 2016-03-03 DIAGNOSIS — Z955 Presence of coronary angioplasty implant and graft: Secondary | ICD-10-CM | POA: Diagnosis not present

## 2016-03-10 DIAGNOSIS — H6122 Impacted cerumen, left ear: Secondary | ICD-10-CM | POA: Diagnosis not present

## 2016-03-10 DIAGNOSIS — H903 Sensorineural hearing loss, bilateral: Secondary | ICD-10-CM | POA: Diagnosis not present

## 2016-03-10 DIAGNOSIS — H6983 Other specified disorders of Eustachian tube, bilateral: Secondary | ICD-10-CM | POA: Diagnosis not present

## 2016-04-09 DIAGNOSIS — M9983 Other biomechanical lesions of lumbar region: Secondary | ICD-10-CM | POA: Diagnosis not present

## 2016-04-09 DIAGNOSIS — M47817 Spondylosis without myelopathy or radiculopathy, lumbosacral region: Secondary | ICD-10-CM | POA: Diagnosis not present

## 2016-04-09 DIAGNOSIS — M545 Low back pain: Secondary | ICD-10-CM | POA: Diagnosis not present

## 2016-04-09 DIAGNOSIS — M5136 Other intervertebral disc degeneration, lumbar region: Secondary | ICD-10-CM | POA: Diagnosis not present

## 2016-04-10 ENCOUNTER — Ambulatory Visit (INDEPENDENT_AMBULATORY_CARE_PROVIDER_SITE_OTHER): Payer: Medicare Other | Admitting: Otolaryngology

## 2016-04-10 DIAGNOSIS — H6983 Other specified disorders of Eustachian tube, bilateral: Secondary | ICD-10-CM

## 2016-05-08 DIAGNOSIS — H1132 Conjunctival hemorrhage, left eye: Secondary | ICD-10-CM | POA: Diagnosis not present

## 2016-06-05 ENCOUNTER — Ambulatory Visit (INDEPENDENT_AMBULATORY_CARE_PROVIDER_SITE_OTHER): Payer: Medicare Other | Admitting: Otolaryngology

## 2016-06-05 DIAGNOSIS — H6983 Other specified disorders of Eustachian tube, bilateral: Secondary | ICD-10-CM

## 2016-06-05 DIAGNOSIS — H9071 Mixed conductive and sensorineural hearing loss, unilateral, right ear, with unrestricted hearing on the contralateral side: Secondary | ICD-10-CM | POA: Diagnosis not present

## 2016-06-05 DIAGNOSIS — H903 Sensorineural hearing loss, bilateral: Secondary | ICD-10-CM

## 2016-06-13 DIAGNOSIS — H9011 Conductive hearing loss, unilateral, right ear, with unrestricted hearing on the contralateral side: Secondary | ICD-10-CM | POA: Diagnosis not present

## 2016-06-13 DIAGNOSIS — H6981 Other specified disorders of Eustachian tube, right ear: Secondary | ICD-10-CM | POA: Diagnosis not present

## 2016-06-13 DIAGNOSIS — H6521 Chronic serous otitis media, right ear: Secondary | ICD-10-CM | POA: Diagnosis not present

## 2016-07-10 ENCOUNTER — Ambulatory Visit (INDEPENDENT_AMBULATORY_CARE_PROVIDER_SITE_OTHER): Payer: Medicare Other | Admitting: Otolaryngology

## 2016-07-10 DIAGNOSIS — H7201 Central perforation of tympanic membrane, right ear: Secondary | ICD-10-CM

## 2016-07-10 DIAGNOSIS — H6983 Other specified disorders of Eustachian tube, bilateral: Secondary | ICD-10-CM

## 2016-07-15 DIAGNOSIS — Z13220 Encounter for screening for lipoid disorders: Secondary | ICD-10-CM | POA: Diagnosis not present

## 2016-07-15 DIAGNOSIS — I10 Essential (primary) hypertension: Secondary | ICD-10-CM | POA: Diagnosis not present

## 2016-07-15 DIAGNOSIS — E7800 Pure hypercholesterolemia, unspecified: Secondary | ICD-10-CM | POA: Diagnosis not present

## 2016-07-15 DIAGNOSIS — E1165 Type 2 diabetes mellitus with hyperglycemia: Secondary | ICD-10-CM | POA: Diagnosis not present

## 2016-07-15 DIAGNOSIS — R5382 Chronic fatigue, unspecified: Secondary | ICD-10-CM | POA: Diagnosis not present

## 2016-07-15 DIAGNOSIS — E782 Mixed hyperlipidemia: Secondary | ICD-10-CM | POA: Diagnosis not present

## 2016-07-17 DIAGNOSIS — E119 Type 2 diabetes mellitus without complications: Secondary | ICD-10-CM | POA: Diagnosis not present

## 2016-07-17 DIAGNOSIS — M545 Low back pain: Secondary | ICD-10-CM | POA: Diagnosis not present

## 2016-07-17 DIAGNOSIS — I10 Essential (primary) hypertension: Secondary | ICD-10-CM | POA: Diagnosis not present

## 2016-07-17 DIAGNOSIS — E782 Mixed hyperlipidemia: Secondary | ICD-10-CM | POA: Diagnosis not present

## 2016-07-17 DIAGNOSIS — R5383 Other fatigue: Secondary | ICD-10-CM | POA: Diagnosis not present

## 2016-09-22 ENCOUNTER — Other Ambulatory Visit: Payer: Self-pay | Admitting: Cardiology

## 2016-09-22 NOTE — Telephone Encounter (Signed)
Rx request sent to pharmacy.  

## 2016-10-02 DIAGNOSIS — Z23 Encounter for immunization: Secondary | ICD-10-CM | POA: Diagnosis not present

## 2016-10-23 ENCOUNTER — Other Ambulatory Visit: Payer: Self-pay | Admitting: Cardiology

## 2016-10-23 NOTE — Telephone Encounter (Signed)
Rx(s) sent to pharmacy electronically.  

## 2016-11-17 ENCOUNTER — Other Ambulatory Visit: Payer: Self-pay | Admitting: Cardiology

## 2016-11-17 NOTE — Telephone Encounter (Signed)
REFILL 

## 2016-11-18 DIAGNOSIS — E782 Mixed hyperlipidemia: Secondary | ICD-10-CM | POA: Diagnosis not present

## 2016-11-18 DIAGNOSIS — E7800 Pure hypercholesterolemia, unspecified: Secondary | ICD-10-CM | POA: Diagnosis not present

## 2016-11-18 DIAGNOSIS — E1165 Type 2 diabetes mellitus with hyperglycemia: Secondary | ICD-10-CM | POA: Diagnosis not present

## 2016-11-18 DIAGNOSIS — I10 Essential (primary) hypertension: Secondary | ICD-10-CM | POA: Diagnosis not present

## 2016-11-19 ENCOUNTER — Ambulatory Visit (INDEPENDENT_AMBULATORY_CARE_PROVIDER_SITE_OTHER): Payer: Medicare Other | Admitting: Cardiology

## 2016-11-19 ENCOUNTER — Encounter: Payer: Self-pay | Admitting: Cardiology

## 2016-11-19 VITALS — BP 191/75 | HR 77 | Ht 66.0 in | Wt 138.0 lb

## 2016-11-19 DIAGNOSIS — I2510 Atherosclerotic heart disease of native coronary artery without angina pectoris: Secondary | ICD-10-CM | POA: Diagnosis not present

## 2016-11-19 DIAGNOSIS — Z955 Presence of coronary angioplasty implant and graft: Secondary | ICD-10-CM

## 2016-11-19 DIAGNOSIS — Z9861 Coronary angioplasty status: Secondary | ICD-10-CM

## 2016-11-19 DIAGNOSIS — E785 Hyperlipidemia, unspecified: Secondary | ICD-10-CM | POA: Diagnosis not present

## 2016-11-19 DIAGNOSIS — T82855A Stenosis of coronary artery stent, initial encounter: Secondary | ICD-10-CM | POA: Diagnosis not present

## 2016-11-19 DIAGNOSIS — I249 Acute ischemic heart disease, unspecified: Secondary | ICD-10-CM

## 2016-11-19 DIAGNOSIS — I10 Essential (primary) hypertension: Secondary | ICD-10-CM

## 2016-11-19 DIAGNOSIS — I350 Nonrheumatic aortic (valve) stenosis: Secondary | ICD-10-CM

## 2016-11-19 NOTE — Patient Instructions (Signed)
SCHEDULE AT [address] Your physician has requested that you have an echocardiogram. Echocardiography is a painless test that uses sound waves to create images of your heart. It provides your doctor with information about the size and shape of your heart and how well your heart's chambers and valves are working. This procedure takes approximately one hour. There are no restrictions for this procedure.   HAVE PRIMARY CHECK BLOOD PRESSURE TOMORROW ,IF STILL ELEVATED RECOMMEND INCREASING AMLODIPINE  Your physician wants you to follow-up in: 12 MONTHS WITH DR HARDING. You will receive a reminder letter in the mail two months in advance. If you don't receive a letter, please call our office to schedule the follow-up appointment.  If you need a refill on your cardiac medications before your next appointment, please call your pharmacy.

## 2016-11-19 NOTE — Progress Notes (Signed)
PCP: Estanislado PandySASSER,PAUL W, MD  Clinic Note: Chief Complaint  Patient presents with  . Follow-up  . Leg Pain    legs at night randomly.    HPI: [name] is a 80 y.o. female with a PMH below who presents today for Annual follow-up of her CAD-RCA PCI back in 1999 and 2010 then ISR PTCA in 2012. She was a long-term patient of Dr. Lysle MoralesHowell Little from 1999 forward. Initial PTCA in 1999 was related to an inferior non-STEMI.[name].  [name] was last seen in September 2016. In the past and amlodipine have been stopped due to edema.  Recent Hospitalizations: None  Studies Reviewed: None  Interval History: [name] presents today for her routine follow-up. He was doing well. She has occasional spells were heart rate will increase lasting up to maybe a minute. This usually occurs when she is in bed trying to relax. This occurs maybe once or twice a month if that. Slowed of shortness of breath when it happens but otherwise is not overly worried about it because it doesn't last very long self-limiting. She indicates that she's not sure what's up with her blood pressure today because she usually says at home was systolics in the 130s to 140s at the most. She sees her on a care doctor every 4 months and has not had blood pressures as high.  He doesn't have any sensation of any headaches or blurred vision. She is currently bothered however by chronic back pain that is somewhat uncomfortable for her day sitting on the table. She says this may be Lasher pressures high she needs to take something for it. She is a little concerned about overdoing her blood pressure control because she has orthostatic dizziness when she takes too much blood pressure medication.  She has not had any recurrence of her anginal symptoms that would be consistent with prior MI type symptom. No chest tightness or pressure with rest or exertion. Really only intermittent exertional dyspnea if she overdoes it. No heart failure  symptoms of PND or orthopnea but does have some mild occasional edema she is says is just really little puffiness.  No lightheadedness, dizziness, weakness or syncope/near syncope. No TIA/amaurosis fugax symptoms. No claudication.  ROS: A comprehensive was performed. Review of Systems  Constitutional: Negative.   HENT: Negative for congestion, nosebleeds and sinus pain.   Eyes: Negative.   Respiratory: Negative for cough, shortness of breath and wheezing.   Cardiovascular:       Per HPI  Gastrointestinal: Negative for blood in stool, heartburn, melena, nausea and vomiting.  Genitourinary: Negative for hematuria.  Musculoskeletal: Positive for back pain and joint pain. Negative for falls.  Skin: Negative.   Neurological: Positive for dizziness (rarely notes orthostatic Sx.). Negative for focal weakness.  Psychiatric/Behavioral: Negative.   All other systems reviewed and are negative.   Past Medical History:  Diagnosis Date  . Aortic stenosis, mild 08/2011; 08/2013   a) 2D Echo Sept 2012 : Borderline LVH, EF greater than 55%. Mild to moderate TR, mild MR, very mild AS w/o AI;;b)  Echo 08/2013 Normal LV size and function. EF 65-70%. Grd 2 DD, mild AS, mild MR  . CAD S/P percutaneous coronary angioplasty 1990s, 11/28/2011    midRCA PCI 1999 (NIR BMS 3.0 mm x 32 mm); distal  RCA BMS Sept 2012 (Vision BMS 2.5 mm x 15 mm); 12/02: RCA ISR --> Cutting PTCA   . Coronary stent restenosis due to progression of disease - Cutting Balloon  PTCA to RCA stent 11/30/2011   Cutting Balloon PTCA  . Diabetes mellitus type 2, controlled, without complications (HCC)   . Dyslipidemia, goal LDL below 70 11/28/2011  . Essential hypertension    On ACE inhibitor and ARB? As well as atenolol and hydralazine  . GERD (gastroesophageal reflux disease)   . H/O echocardiogram 08/2011   EF ->55%  . History of blood transfusion    without reaction  . History of nuclear stress test 2008   low risk scan  . History of:  Acute coronary syndrome 11/28/2011   Dyspnea and Shoulder pain prior to adm   . Myocardial infarction 1990s   Had PTCA then PCI on RCA    Past Surgical History:  Procedure Laterality Date  . ABDOMINAL HYSTERECTOMY    . BREAST BIOPSY    . CARDIAC CATHETERIZATION  2012   performed for angina  . CARDIAC CATHETERIZATION    . CORONARY ANGIOPLASTY  11/2006   Cutting Balloon of stent in the RCA  . LEFT HEART CATHETERIZATION WITH CORONARY ANGIOGRAM N/A 11/27/2011   Procedure: LEFT HEART CATHETERIZATION WITH CORONARY ANGIOGRAM;  Surgeon: Marykay Lex, MD;  Location[facility] CATH LAB;  Service: Cardiovascular;  Laterality: N/A;  . PERCUTANEOUS CORONARY STENT INTERVENTION (PCI-S)  1999   Mid RCA - NIR DMS 3.0 mm x 30 mm, dRCA Vision BMS 2.5 mm x 15 mm   . TRANSTHORACIC ECHOCARDIOGRAM  September 2014   Normal LV size and function. EF 65-70%. Grd 2 DD, mild AS, mild MR  . TUBAL LIGATION     Current Meds  Medication Sig  . amLODipine (NORVASC) 2.5 MG tablet Take 2.5 mg by mouth daily.  Marland Kitchen atenolol (TENORMIN) 50 MG tablet Take 50 mg by mouth 2 (two) times daily.    Marland Kitchen atorvastatin (LIPITOR) 20 MG tablet Take 20 mg by mouth daily.  . benazepril (LOTENSIN) 40 MG tablet Take 40 mg by mouth daily.    . clopidogrel (PLAVIX) 75 MG tablet TAKE ONE (1) TABLET EACH DAY  . furosemide (LASIX) 20 MG tablet Take 20 mg by mouth as needed.   . hydrALAZINE (APRESOLINE) 100 MG tablet Take 1 tablet (100 mg total) by mouth 2 (two) times daily. NEED OV.  . meloxicam (MOBIC) 7.5 MG tablet Take 7.5 mg by mouth daily.  . metFORMIN (GLUCOPHAGE-XR) 500 MG 24 hr tablet Take 500 mg by mouth daily.  . pantoprazole (PROTONIX) 40 MG tablet TAKE ONE (1) TABLET EACH DAY  . traMADol (ULTRAM) 50 MG tablet Take 1 tablet by mouth as needed.  . valsartan (DIOVAN) 320 MG tablet Take 320 mg by mouth daily.   . vitamin B-12 (CYANOCOBALAMIN) 1000 MCG tablet Take 1,000 mcg by mouth daily.  . [DISCONTINUED] amLODipine (NORVASC) 5 MG tablet  Take 1 tablet (5 mg total) by mouth daily. PLEASE CONTACT OFFICE FOR ADDITIONAL REFILLS 2ND ATTEMPT  . [DISCONTINUED] atorvastatin (LIPITOR) 40 MG tablet Take 40 mg by mouth at bedtime.     Allergies  Allergen Reactions  . Valium Nausea And Vomiting    Social History   Social History  . Marital status: Married    Spouse name: N/A  . Number of children: N/A  . Years of education: N/A   Social History Main Topics  . Smoking status: Former Games developer  . Smokeless tobacco: Never Used     Comment: Quit approximately 72  . Alcohol use No  . Drug use: No  . Sexual activity: Not Currently    Birth control/  protection: Post-menopausal   Other Topics Concern  . None   Social History Narrative   She is a married mother of 3, grandmother to. 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 Quit smoking in '99, denies alcohol.   She volunteers for [employer].    Family History  Problem Relation Age of Onset  . Cancer Mother   . Heart disease Father   . Heart disease Maternal Grandmother   . Heart disease Maternal Grandfather   . Heart disease Brother   . Nephrolithiasis Brother     accidental death  . Heart Problems Brother     CABG     Wt Readings from Last 3 Encounters:  11/19/16 62.6 kg (138 lb)  08/23/15 69.1 kg (152 lb 6.4 oz)  08/31/14 74.3 kg (163 lb 11.2 oz)    PHYSICAL EXAM BP (!) 191/75   Pulse 77   Ht 5\' 6"  (1.676 m)   Wt 62.6 kg (138 lb)   BMI 22.27 kg/m  General appearance: A&Ox3, cooperative, appears younger than stated age, no distress and Healthy-appearing. Well-nourished and well-groomed.  HEENT: Wink/AT, EOMI, MMM, anicteric sclera  Neck: no adenopathy, soft left carotid bruit, no JVD and supple, symmetrical, trachea midline  Lungs: clear to auscultation bilaterally, normal percussion bilaterally and nonlabored, good air movement  Heart: RRR, normal S1-S2. 3/6 HSM along LSB, also 2/6 crescendo-decrescendo mid peaking SEM at RUSB radiating to  carotids.  Abdomen: soft, non-tender; bowel sounds normal; no masses, no organomegaly and no HJR, mildly obese  Extremities: extremities normal, atraumatic, no cyanosis or edema  Pulses: 2+ and symmetric  Neurologic: Grossly normal    Adult ECG Report  Rate: 77 ;  Rhythm: normal sinus rhythm and Non-specific ST-T wave changes;   Narrative Interpretation: Stable EKG   Other studies Reviewed: Additional studies/ records that were reviewed today include:  Recent Labs:  Not available.    ASSESSMENT / PLAN: Problem List Items Addressed This Visit    Presence of stent in right coronary artery, HSRA for ISR this admission (Chronic)    On Plavix alone for long-term management given her multivessel PCI.      CAD S/P percutaneous coronary angioplasty -- PCI with BMS to RCA followed by POBA of ISR - Primary (Chronic)    No recurrent anginal symptoms on current medications. Continue current dose of beta blocker for now (may need to consider a different beta blocker because palpitations), valsartan, amlodipine Antiplatelet agent: Plavix without aspirin  No change.      Relevant Medications   amLODipine (NORVASC) 2.5 MG tablet   atorvastatin (LIPITOR) 20 MG tablet   Other Relevant Orders   EKG 12-Lead (Completed)   ECHOCARDIOGRAM COMPLETE   Coronary stent restenosis due to progression of disease (Chronic)   Moderate essential hypertension (Chronic)    Blood pressure is way up again today. She is on high-dose atenolol, and hydralazine along with combination of ACE inhibitor and ARB which probably isn't helping any.  Last, saw her my plan was to increase amlodipine up to 5 mg but this was reduced against because of edema. She is not on a true antihypertensive diuretic which would be the next option - my recommendation and had been for her to take furosemide daily.  Plan is for her to have her blood pressure rechecked by her PCP during her visit tomorrow and make adjustments if  necessary. Plan will be to increase amlodipine.      Relevant Medications   amLODipine (  NORVASC) 2.5 MG tablet   atorvastatin (LIPITOR) 20 MG tablet   Other Relevant Orders   EKG 12-Lead (Completed)   ECHOCARDIOGRAM COMPLETE   Dyslipidemia, goal LDL below 70 (Chronic)    Now back on atorvastatin. Labs not available. Followed by PCP.      Relevant Medications   amLODipine (NORVASC) 2.5 MG tablet   atorvastatin (LIPITOR) 20 MG tablet   Other Relevant Orders   EKG 12-Lead (Completed)   ECHOCARDIOGRAM COMPLETE   Aortic stenosis, mild (Chronic)    She has been stable before. She is due to have a repeat echocardiogram prior to her next visit.      Relevant Medications   amLODipine (NORVASC) 2.5 MG tablet   atorvastatin (LIPITOR) 20 MG tablet   Other Relevant Orders   EKG 12-Lead (Completed)   ECHOCARDIOGRAM COMPLETE   History of: Acute coronary syndrome (Chronic)    No recurrent unstable angina symptoms. Her symptoms are mostly consistent with shoulder pain and dyspnea. She has not even had to require supplemental nitroglycerin.      Relevant Medications   amLODipine (NORVASC) 2.5 MG tablet   atorvastatin (LIPITOR) 20 MG tablet      Current medicines are reviewed at length with the patient today. (+/- concerns) none The following changes have been made: none  Patient Instructions  SCHEDULE AT [address] Your physician has requested that you have an echocardiogram. Echocardiography is a painless test that uses sound waves to create images of your heart. It provides your doctor with information about the size and shape of your heart and how well your heart's chambers and valves are working. This procedure takes approximately one hour. There are no restrictions for this procedure.   HAVE PRIMARY CHECK BLOOD PRESSURE TOMORROW ,IF STILL ELEVATED RECOMMEND INCREASING AMLODIPINE  Your physician wants you to follow-up in: 12 MONTHS WITH DR HARDING. You  will receive a reminder letter in the mail two months in advance. If you don't receive a letter, please call our office to schedule the follow-up appointment.  If you need a refill on your cardiac medications before your next appointment, please call your pharmacy.     Studies Ordered:   Orders Placed This Encounter  Procedures  . EKG 12-Lead  . ECHOCARDIOGRAM COMPLETE      Bryan Lemma, M.D., M.S. Interventional Cardiologist   Pager # [phone] Phone # [phone] [address] [place], [place] [address]

## 2016-11-20 DIAGNOSIS — M545 Low back pain: Secondary | ICD-10-CM | POA: Diagnosis not present

## 2016-11-20 DIAGNOSIS — E119 Type 2 diabetes mellitus without complications: Secondary | ICD-10-CM | POA: Diagnosis not present

## 2016-11-20 DIAGNOSIS — R5383 Other fatigue: Secondary | ICD-10-CM | POA: Diagnosis not present

## 2016-11-20 DIAGNOSIS — E782 Mixed hyperlipidemia: Secondary | ICD-10-CM | POA: Diagnosis not present

## 2016-11-20 DIAGNOSIS — I10 Essential (primary) hypertension: Secondary | ICD-10-CM | POA: Diagnosis not present

## 2016-11-21 HISTORY — PX: TRANSTHORACIC ECHOCARDIOGRAM: SHX275

## 2016-11-22 ENCOUNTER — Encounter: Payer: Self-pay | Admitting: Cardiology

## 2016-11-22 NOTE — Assessment & Plan Note (Signed)
No recurrent unstable angina symptoms. Her symptoms are mostly consistent with shoulder pain and dyspnea. She has not even had to require supplemental nitroglycerin.

## 2016-11-22 NOTE — Assessment & Plan Note (Signed)
Now back on atorvastatin. Labs not available. Followed by PCP.

## 2016-11-22 NOTE — Assessment & Plan Note (Signed)
She has been stable before. She is due to have a repeat echocardiogram prior to her next visit.

## 2016-11-22 NOTE — Assessment & Plan Note (Signed)
On Plavix alone for long-term management given her multivessel PCI.

## 2016-11-22 NOTE — Assessment & Plan Note (Signed)
No recurrent anginal symptoms on current medications. Continue current dose of beta blocker for now (may need to consider a different beta blocker because palpitations), valsartan, amlodipine Antiplatelet agent: Plavix without aspirin  No change.

## 2016-11-22 NOTE — Assessment & Plan Note (Addendum)
Blood pressure is way up again today. She is on high-dose atenolol, and hydralazine along with combination of ACE inhibitor and ARB which probably isn't helping any.  Last, saw her my plan was to increase amlodipine up to 5 mg but this was reduced against because of edema. She is not on a true antihypertensive diuretic which would be the next option - my recommendation and had been for her to take furosemide daily.  Plan is for her to have her blood pressure rechecked by her PCP during her visit tomorrow and make adjustments if necessary. Plan will be to increase amlodipine.

## 2016-11-24 ENCOUNTER — Other Ambulatory Visit: Payer: Self-pay | Admitting: Cardiology

## 2016-11-24 NOTE — Telephone Encounter (Signed)
Rx(s) sent to pharmacy electronically.  

## 2016-12-03 ENCOUNTER — Other Ambulatory Visit: Payer: Self-pay | Admitting: Cardiology

## 2016-12-03 NOTE — Telephone Encounter (Signed)
Rx(s) sent to pharmacy electronically.  

## 2016-12-10 ENCOUNTER — Other Ambulatory Visit: Payer: Self-pay | Admitting: Cardiology

## 2016-12-12 ENCOUNTER — Ambulatory Visit (HOSPITAL_COMMUNITY): Payer: Medicare Other | Attending: Cardiology

## 2016-12-12 ENCOUNTER — Other Ambulatory Visit: Payer: Self-pay

## 2016-12-12 DIAGNOSIS — Z9861 Coronary angioplasty status: Secondary | ICD-10-CM | POA: Diagnosis not present

## 2016-12-12 DIAGNOSIS — E119 Type 2 diabetes mellitus without complications: Secondary | ICD-10-CM | POA: Insufficient documentation

## 2016-12-12 DIAGNOSIS — E785 Hyperlipidemia, unspecified: Secondary | ICD-10-CM | POA: Insufficient documentation

## 2016-12-12 DIAGNOSIS — I2510 Atherosclerotic heart disease of native coronary artery without angina pectoris: Secondary | ICD-10-CM | POA: Insufficient documentation

## 2016-12-12 DIAGNOSIS — I350 Nonrheumatic aortic (valve) stenosis: Secondary | ICD-10-CM | POA: Diagnosis not present

## 2016-12-12 DIAGNOSIS — I071 Rheumatic tricuspid insufficiency: Secondary | ICD-10-CM | POA: Diagnosis not present

## 2016-12-12 DIAGNOSIS — I052 Rheumatic mitral stenosis with insufficiency: Secondary | ICD-10-CM | POA: Insufficient documentation

## 2016-12-12 DIAGNOSIS — I10 Essential (primary) hypertension: Secondary | ICD-10-CM | POA: Diagnosis not present

## 2016-12-17 ENCOUNTER — Other Ambulatory Visit: Payer: Self-pay | Admitting: Cardiology

## 2017-01-08 ENCOUNTER — Ambulatory Visit (INDEPENDENT_AMBULATORY_CARE_PROVIDER_SITE_OTHER): Payer: Medicare Other | Admitting: Otolaryngology

## 2017-01-22 ENCOUNTER — Ambulatory Visit (INDEPENDENT_AMBULATORY_CARE_PROVIDER_SITE_OTHER): Payer: Medicare Other | Admitting: Otolaryngology

## 2017-01-22 DIAGNOSIS — H7201 Central perforation of tympanic membrane, right ear: Secondary | ICD-10-CM

## 2017-01-22 DIAGNOSIS — H6983 Other specified disorders of Eustachian tube, bilateral: Secondary | ICD-10-CM

## 2017-03-10 DIAGNOSIS — R5382 Chronic fatigue, unspecified: Secondary | ICD-10-CM | POA: Diagnosis not present

## 2017-03-10 DIAGNOSIS — E7800 Pure hypercholesterolemia, unspecified: Secondary | ICD-10-CM | POA: Diagnosis not present

## 2017-03-10 DIAGNOSIS — E782 Mixed hyperlipidemia: Secondary | ICD-10-CM | POA: Diagnosis not present

## 2017-03-10 DIAGNOSIS — E1165 Type 2 diabetes mellitus with hyperglycemia: Secondary | ICD-10-CM | POA: Diagnosis not present

## 2017-03-10 DIAGNOSIS — I10 Essential (primary) hypertension: Secondary | ICD-10-CM | POA: Diagnosis not present

## 2017-03-12 DIAGNOSIS — M545 Low back pain: Secondary | ICD-10-CM | POA: Diagnosis not present

## 2017-03-12 DIAGNOSIS — E119 Type 2 diabetes mellitus without complications: Secondary | ICD-10-CM | POA: Diagnosis not present

## 2017-03-12 DIAGNOSIS — R5383 Other fatigue: Secondary | ICD-10-CM | POA: Diagnosis not present

## 2017-03-12 DIAGNOSIS — I10 Essential (primary) hypertension: Secondary | ICD-10-CM | POA: Diagnosis not present

## 2017-03-12 DIAGNOSIS — E782 Mixed hyperlipidemia: Secondary | ICD-10-CM | POA: Diagnosis not present

## 2017-05-01 DIAGNOSIS — I10 Essential (primary) hypertension: Secondary | ICD-10-CM | POA: Diagnosis not present

## 2017-05-01 DIAGNOSIS — R0602 Shortness of breath: Secondary | ICD-10-CM | POA: Diagnosis not present

## 2017-05-01 DIAGNOSIS — R600 Localized edema: Secondary | ICD-10-CM | POA: Diagnosis not present

## 2017-05-01 DIAGNOSIS — Z6824 Body mass index (BMI) 24.0-24.9, adult: Secondary | ICD-10-CM | POA: Diagnosis not present

## 2017-05-01 DIAGNOSIS — M545 Low back pain: Secondary | ICD-10-CM | POA: Diagnosis not present

## 2017-05-01 DIAGNOSIS — R609 Edema, unspecified: Secondary | ICD-10-CM | POA: Diagnosis not present

## 2017-05-01 DIAGNOSIS — I2510 Atherosclerotic heart disease of native coronary artery without angina pectoris: Secondary | ICD-10-CM | POA: Diagnosis not present

## 2017-05-04 ENCOUNTER — Telehealth: Payer: Self-pay | Admitting: Cardiology

## 2017-05-04 NOTE — Telephone Encounter (Signed)
Patient calling, states that she has swelling in her feet. Patient states that she saw her PCP and they ran tests and found that her "BNP" was high. Patient is scheduled to see Dr. Herbie BaltimoreHarding on 05-11-17. Thanks.

## 2017-05-04 NOTE — Telephone Encounter (Signed)
Sounds like a good plan. Labs will be helpful.  Bryan Lemmaavid Willia Lampert, MD

## 2017-05-04 NOTE — Telephone Encounter (Signed)
Spoke with patient and she saw her PCP office on Friday She is having swelling in her ankles and feet and has been taking her Furosemide 20 mg daily for about 3 weeks.  PA ordered some labs (BNP elevated) and increased her Furosemide to 40 mg daily until ov Monday 05/11/17 with Dr Herbie BaltimoreHarding. Patient denies any shortness of breath Called PCP and requested labs and office note

## 2017-05-04 NOTE — Telephone Encounter (Signed)
05/04/2017 Received faxed medical records from [facility] for upcoming appointment with Dr. Herbie BaltimoreHarding on 05/11/2017. [facility]  cbr

## 2017-05-11 ENCOUNTER — Encounter: Payer: Self-pay | Admitting: Cardiology

## 2017-05-11 ENCOUNTER — Ambulatory Visit (INDEPENDENT_AMBULATORY_CARE_PROVIDER_SITE_OTHER): Payer: Medicare Other | Admitting: Cardiology

## 2017-05-11 VITALS — BP 154/64 | HR 69 | Ht 65.5 in | Wt 144.4 lb

## 2017-05-11 DIAGNOSIS — Z955 Presence of coronary angioplasty implant and graft: Secondary | ICD-10-CM

## 2017-05-11 DIAGNOSIS — I5032 Chronic diastolic (congestive) heart failure: Secondary | ICD-10-CM

## 2017-05-11 DIAGNOSIS — R600 Localized edema: Secondary | ICD-10-CM

## 2017-05-11 DIAGNOSIS — I110 Hypertensive heart disease with heart failure: Secondary | ICD-10-CM | POA: Diagnosis not present

## 2017-05-11 DIAGNOSIS — Z9861 Coronary angioplasty status: Secondary | ICD-10-CM | POA: Diagnosis not present

## 2017-05-11 DIAGNOSIS — I350 Nonrheumatic aortic (valve) stenosis: Secondary | ICD-10-CM

## 2017-05-11 DIAGNOSIS — E785 Hyperlipidemia, unspecified: Secondary | ICD-10-CM

## 2017-05-11 DIAGNOSIS — I2510 Atherosclerotic heart disease of native coronary artery without angina pectoris: Secondary | ICD-10-CM

## 2017-05-11 DIAGNOSIS — I10 Essential (primary) hypertension: Secondary | ICD-10-CM | POA: Diagnosis not present

## 2017-05-11 DIAGNOSIS — I249 Acute ischemic heart disease, unspecified: Secondary | ICD-10-CM | POA: Diagnosis not present

## 2017-05-11 MED ORDER — CARVEDILOL 12.5 MG PO TABS: 12.5000 mg | ORAL_TABLET | Freq: Two times a day (BID) | ORAL | 3 refills | Status: DC

## 2017-05-11 MED ORDER — FUROSEMIDE 40 MG PO TABS: 40.0000 mg | ORAL_TABLET | Freq: Every day | ORAL | 3 refills | Status: DC

## 2017-05-11 NOTE — Progress Notes (Signed)
PCP: April Pandy, MD  Clinic Note: Chief Complaint  Patient presents with  . Edema    both feet   . Follow-up    cad-PCI. AS.    HPI: [name] is a 81 y.o. female with a PMH below who presents today for six-month follow-up of CAD-PCI to RCA in 1999 (Inferior Non-STEMI) 08/30/2009. There is a restenosis PTCA in 2012. Former patient of Dr. Julieanne Patton.  [name] was last seen in the end of November 2017 - noted some episodes of rapid heartbeats occurring mostly while in bed trying to relax. Blood pressures usually range from 130-140 mmHg.  She denied any active failure or angina symptoms.  Recent Hospitalizations:  --- She was recently seen by the PA at her PCPs office for foot swelling. She was told to double up her Lasix and her swelling has gone down some.  Studies Personally Reviewed - (if available, images/films reviewed: From Epic Chart or Care Everywhere)  2-D Echo 12/12/2016: vigorous LV function with EF 65-70%. GR 2 DD. No regional wall motion abnormalities. Mild to moderate aortic stenosis (peak gradient 35 mmHg). Moderate LA dilation. Mild MRModerate TR. PA pressures estimated 35-40 mmHg.  Interval History: [name] presents today stating that her swelling has improved since essentially doubling her Lasix. She really denied any PND or orthopnea associated with edema. Otherwise she is doing relatively fair with no major cardiac symptoms. No resting or exertional chest tightness or dyspnea. Occasional rare "flip-flopping" sensations, but no significant palpitations or rapid irregular heartbeats.  No lightheadedness, dizziness, weakness, syncope/near syncope, or TIA/amaurosis fugax symptoms. No claudication.  ROS: A comprehensive was performed. Review of Systems  Constitutional: Negative for malaise/fatigue.  HENT: Negative for nosebleeds.   Eyes: Negative for blurred vision.  Respiratory: Positive for shortness of breath (No change from baseline).  Negative for cough and wheezing.   Gastrointestinal: Negative for blood in stool and melena.  Musculoskeletal: Positive for joint pain.       She has arthritis pains that do limit her ability to walk around very much. Was prescribed Mobic  Neurological: Positive for dizziness (Not significantly). Negative for headaches.  Psychiatric/Behavioral: Positive for memory loss. Negative for depression. The patient is not nervous/anxious and does not have insomnia.   All other systems reviewed and are negative.  I have reviewed and (if needed) personally updated the patient's problem list, medications, allergies, past medical and surgical history, social and family history.   Past Medical History:  Diagnosis Date  . Aortic stenosis, mild 08/2011; 08/2013   a) 2D Echo Sept 2012 : Borderline LVH, EF greater than 55%. Mild to moderate TR, mild MR, very mild AS w/o AI;;b)  Echo 08/2013 Normal LV size and function. EF 65-70%. Grd 2 DD, mild AS, mild MR  . CAD S/P percutaneous coronary angioplasty 1990s, 11/28/2011    midRCA PCI 1999 (NIR BMS 3.0 mm x 32 mm); distal  RCA BMS Sept 2012 (Vision BMS 2.5 mm x 15 mm); 12/02: RCA ISR --> Cutting PTCA   . Coronary stent restenosis due to progression of disease - Cutting Balloon PTCA to RCA stent 11/30/2011   Cutting Balloon PTCA  . Diabetes mellitus type 2, controlled, without complications (HCC)   . Dyslipidemia, goal LDL below 70 11/28/2011  . Essential hypertension    On ACE inhibitor and ARB? As well as atenolol and hydralazine  . GERD (gastroesophageal reflux disease)   . H/O echocardiogram 08/2011   EF ->55%  . History  of blood transfusion    without reaction  . History of nuclear stress test 2008   low risk scan  . History of: Acute coronary syndrome 11/28/2011   Dyspnea and Shoulder pain prior to adm   . Myocardial infarction [facility](HCC) 1990s   Had PTCA then PCI on RCA    Past Surgical History:  Procedure Laterality Date  . ABDOMINAL HYSTERECTOMY    .  BREAST BIOPSY    . CARDIAC CATHETERIZATION  2012   performed for angina  . CARDIAC CATHETERIZATION    . CORONARY ANGIOPLASTY  11/2006   Cutting Balloon of stent in the RCA  . LEFT HEART CATHETERIZATION WITH CORONARY ANGIOGRAM N/A 11/27/2011   Procedure: LEFT HEART CATHETERIZATION WITH CORONARY ANGIOGRAM;  Surgeon: Marykay Lexavid W Harding, MD;  [facility];  Service: Cardiovascular;  Laterality: N/A;  . PERCUTANEOUS CORONARY STENT INTERVENTION (PCI-S)  1999   Mid RCA - NIR DMS 3.0 mm x 30 mm, dRCA Vision BMS 2.5 mm x 15 mm   . TRANSTHORACIC ECHOCARDIOGRAM  September 2014   Normal LV size and function. EF 65-70%. Grd 2 DD, mild AS, mild MR  . TRANSTHORACIC ECHOCARDIOGRAM  11/2016   2-D Echo 12/12/2016: vigorous LV function with EF 6570%. GR 2 DD. No regional wall motion abnormalities. Mild to moderate aortic stenosis (peak gradient 35 mmHg). Moderate LA dilation. Mild MRModerate TR. PA pressures estimated 35-40 mmHg.  . TUBAL LIGATION      Current Meds  Medication Sig  . amLODipine (NORVASC) 2.5 MG tablet Take 1 tablet (2.5 mg total) by mouth daily.  Marland Kitchen. atorvastatin (LIPITOR) 20 MG tablet Take 20 mg by mouth daily.  . benazepril (LOTENSIN) 40 MG tablet Take 40 mg by mouth daily.    . clopidogrel (PLAVIX) 75 MG tablet TAKE ONE (1) TABLET EACH DAY  . hydrALAZINE (APRESOLINE) 100 MG tablet TAKE ONE TABLET BY MOUTH TWICE DAILY  . meloxicam (MOBIC) 7.5 MG tablet Take 7.5 mg by mouth daily.  . metFORMIN (GLUCOPHAGE-XR) 500 MG 24 hr tablet Take 500 mg by mouth daily.  Marland Kitchen. NITROSTAT 0.4 MG SL tablet DISSOLVE 1 TAB UNDER TOUNGE FOR CHEST PAIN. MAY REPEAT EVERY 5 MINUTES FOR 3 DOSES. IF NO RELIEF CALL 911 OR GO TO ER  . pantoprazole (PROTONIX) 40 MG tablet TAKE ONE (1) TABLET EACH DAY  . traMADol (ULTRAM) 50 MG tablet Take 1 tablet by mouth as needed.  . valsartan (DIOVAN) 320 MG tablet Take 320 mg by mouth daily.   . vitamin B-12 (CYANOCOBALAMIN) 1000 MCG tablet Take 1,000 mcg by mouth daily.  .  [DISCONTINUED] atenolol (TENORMIN) 50 MG tablet Take 50 mg by mouth 2 (two) times daily.    . [DISCONTINUED] furosemide (LASIX) 20 MG tablet Take 20 mg by mouth as needed.     Allergies  Allergen Reactions  . Valium Nausea And Vomiting    Social History   Social History  . Marital status: Married    Spouse name: N/A  . Number of children: N/A  . Years of education: N/A   Social History Main Topics  . Smoking status: Former Games developermoker  . Smokeless tobacco: Never Used     Comment: Quit approximately [id number]  . Alcohol use No  . Drug use: No  . Sexual activity: Not Currently    Birth control/ protection: Post-menopausal   Other Topics Concern  . None   Social History Narrative   She is a married mother of 3, grandmother to. [place] active [place]. Always on the go. Quit smoking in '99, denies alcohol.   She volunteers for [employer].    family history includes Cancer in her mother; Heart Problems in her brother; Heart disease in her brother, father, maternal grandfather, and maternal grandmother; Nephrolithiasis in her brother.  Wt Readings from Last 3 Encounters:  05/11/17 144 lb 6.4 oz (65.5 kg)  11/19/16 138 lb (62.6 kg)  08/23/15 152 lb 6.4 oz (69.1 kg)    PHYSICAL EXAM BP (!) 154/64   Pulse 69   Ht 5' 5.5" (1.664 m)   Wt 144 lb 6.4 oz (65.5 kg)   BMI 23.66 kg/m  General appearance: alert, cooperative, appears stated age, no distress. Well-nourished well-groomed HEENT: [place]/AT, EOMI, MMM, anicteric sclera Neck: no adenopathy, no carotid bruit and no JVD Lungs: clear to auscultation bilaterally, normal percussion bilaterally and non-labored Heart: regular rate and rhythm, S1 &S2 normal, no click, rub or gallop; 2/6 HSM along LSB. Also 2/6C-D mid peaking SEM at RUSB -> carotids. Abdomen: soft, non-tender; bowel sounds normal; no masses,  no organomegaly; mildly obese. No HJR Extremities: extremities normal, atraumatic, no cyanosis, and edema - trivial Pulses:  2+ and symmetric;  Skin: mobility and turgor normal, no evidence of bleeding or bruising, no lesions noted, temperature normal and texture normal  Neurologic: Mental status: Alert & oriented x 3, thought content appropriate; non-focal exam.  Pleasant mood & affect.   Adult ECG Report  Rate: 69 ;  Rhythm: normal sinus rhythm, premature atrial contractions (PAC), premature ventricular contractions (PVC) and Otherwise normal axis, intervals and durations.;   Narrative Interpretation: Relatively stable EKG   Other studies Reviewed: Additional studies/ records that were reviewed today include:  Recent Labs:   No results found for: CHOL, HDL, LDLCALC, LDLDIRECT, TRIG, CHOLHDL = PCP  ASSESSMENT / PLAN: Problem List Items Addressed This Visit    Aortic stenosis, moderate (Chronic)    Now mild to moderate stenosis by echo 01/11/2016.  Has been stable now with not much change since 2014. We can probably check every 2 years. -  Echo will be due in December 2019 - unless the murmur becomes louder, or symptoms warrant      Relevant Medications   carvedilol (COREG) 12.5 MG tablet   furosemide (LASIX) 40 MG tablet   Other Relevant Orders   EKG 12-Lead (Completed)   CAD S/P percutaneous coronary angioplasty -- PCI with BMS to RCA followed by POBA of ISR (Chronic)    No recurrent anginal symptoms. She is on beta blocker and statin, ARB plus ACE inhibitor and calcium channel blocker.  She is on Plavix without aspirin.  Continue current medications with exception of converting beta blocker to carvedilol.      Relevant Medications   carvedilol (COREG) 12.5 MG tablet   furosemide (LASIX) 40 MG tablet   Other Relevant Orders   EKG 12-Lead (Completed)   Dyslipidemia, goal LDL below 70 (Chronic)    On atorvastatin. Monitored by PCP.      Relevant Medications   carvedilol (COREG) 12.5 MG tablet   furosemide (LASIX) 40 MG tablet   History of: Acute coronary syndrome (Chronic)   Relevant  Medications   carvedilol (COREG) 12.5 MG tablet   furosemide (LASIX) 40 MG tablet   Hypertensive heart disease with chronic diastolic congestive heart failure (HCC) - Primary (Chronic)    She now has some pedal edema probably related to venous stasis as opposed to CHF since she does not have significant PND or  orthopnea symptoms. For now I would continue with daily Lasix as opposed to taking it when necessary. She can then take an additional dose for weight gain or edema. We will prescribe slightly more than 30 tablets per month.   Blood pressure still not adequately controlled. Converting from atenolol to carvedilol 12.5 mg twice a day. I think she is on a combination of ARB plus ACE inhibitor potentially because of proteinuria from diabetes. Otherwise normal this is helping much. She is on high-dose hydralazine as well as amlodipine. Perhaps keeping her on a standing dose of Lasix will help her blood pressure management as well.      Relevant Medications   carvedilol (COREG) 12.5 MG tablet   furosemide (LASIX) 40 MG tablet   Other Relevant Orders   EKG 12-Lead (Completed)   Lower extremity edema (Chronic)    Make Lasix standing dose. Continue blood pressure control. I think this is probably not related to heart failure since she does not note PND or orthopnea. May be at least in part related to multiple vasodilators for BP control. Recommend: Support Stockings - knee high.      Moderate essential hypertension (Chronic)    Blood pressure is better today. She is now on max dose amlodipine, hydralazine, combination ACE inhibitor and ARB as well as on moderate dose atenolol to twice a day. I will switch from atenolol to carvedilol to see if this helps blood pressure. Adding standing dose of furosemide.  - Will have her f/u in our CV Risk Reduction (Hypertension) Clinic run by our Pharmacist for further Medication titration.      Relevant Medications   carvedilol (COREG) 12.5 MG tablet    furosemide (LASIX) 40 MG tablet   Other Relevant Orders   EKG 12-Lead (Completed)   Presence of stent in right coronary artery, HSRA for ISR in 2012 (Chronic)    Continue on Plavix without aspirin. She does have multivessel PCI history. Continue on statin.         Current medicines are reviewed at length with the patient today. (+/- concerns) now taking Lasix daily until this visit  The following changes have been made: continue daily Lasix for edema & BP.  Patient Instructions  MEDICATION CHANGES  --STOP ATENOLOL --START CARVEDILOL 12.5 MG ONE TABLET TWICE  A DAY.  START LASIX ( FUROSEMIDE) 40 MG ONE TABLET DAILY, INSTEAD OF AS NEEDED.  Your physician recommends that you schedule a follow-up appointment in 1 MONTH WITH CVRR- FOR BLOOD PRESSURE    Your physician wants you to follow-up in 6 MONTHS WITH DR HARDING.You will receive a reminder letter in the mail two months in advance. If you don't receive a letter, please call our office to schedule the follow-up appointment.    If you need a refill on your cardiac medications before your next appointment, please call your pharmacy.     Studies Ordered:   Orders Placed This Encounter  Procedures  . EKG 12-Lead      Bryan Lemma, M.D., M.S. Interventional Cardiologist   Pager # [phone] Phone # [phone] [address] [place], [place] [address]

## 2017-05-11 NOTE — Patient Instructions (Addendum)
MEDICATION CHANGES  --STOP ATENOLOL --START CARVEDILOL 12.5 MG ONE TABLET TWICE  A DAY.  START LASIX ( FUROSEMIDE) 40 MG ONE TABLET DAILY, INSTEAD OF AS NEEDED.  Your physician recommends that you schedule a follow-up appointment in 1 MONTH WITH CVRR- FOR BLOOD PRESSURE    Your physician wants you to follow-up in 6 MONTHS WITH DR HARDING.You will receive a reminder letter in the mail two months in advance. If you don't receive a letter, please call our office to schedule the follow-up appointment.    If you need a refill on your cardiac medications before your next appointment, please call your pharmacy.

## 2017-05-13 ENCOUNTER — Encounter: Payer: Self-pay | Admitting: Cardiology

## 2017-05-13 NOTE — Assessment & Plan Note (Signed)
Continue on Plavix without aspirin. She does have multivessel PCI history. Continue on statin.

## 2017-05-13 NOTE — Assessment & Plan Note (Signed)
On atorvastatin. Monitored by PCP. 

## 2017-05-13 NOTE — Assessment & Plan Note (Addendum)
Blood pressure is better today. She is now on max dose amlodipine, hydralazine, combination ACE inhibitor and ARB as well as on moderate dose atenolol to twice a day. I will switch from atenolol to carvedilol to see if this helps blood pressure. Adding standing dose of furosemide.  - Will have her f/u in our CV Risk Reduction (Hypertension) Clinic run by our Pharmacist for further Medication titration.

## 2017-05-13 NOTE — Assessment & Plan Note (Addendum)
Now mild to moderate stenosis by echo 01/11/2016.  Has been stable now with not much change since 2014. We can probably check every 2 years. -  Echo will be due in December 2019 - unless the murmur becomes louder, or symptoms warrant

## 2017-05-13 NOTE — Assessment & Plan Note (Signed)
No recurrent anginal symptoms. She is on beta blocker and statin, ARB plus ACE inhibitor and calcium channel blocker.  She is on Plavix without aspirin.  Continue current medications with exception of converting beta blocker to carvedilol.

## 2017-05-13 NOTE — Assessment & Plan Note (Addendum)
Make Lasix standing dose. Continue blood pressure control. I think this is probably not related to heart failure since she does not note PND or orthopnea. May be at least in part related to multiple vasodilators for BP control. Recommend: Support Stockings - knee high.

## 2017-05-13 NOTE — Assessment & Plan Note (Signed)
She now has some pedal edema probably related to venous stasis as opposed to CHF since she does not have significant PND or orthopnea symptoms. For now I would continue with daily Lasix as opposed to taking it when necessary. She can then take an additional dose for weight gain or edema. We will prescribe slightly more than 30 tablets per month.   Blood pressure still not adequately controlled. Converting from atenolol to carvedilol 12.5 mg twice a day. I think she is on a combination of ARB plus ACE inhibitor potentially because of proteinuria from diabetes. Otherwise normal this is helping much. She is on high-dose hydralazine as well as amlodipine. Perhaps keeping her on a standing dose of Lasix will help her blood pressure management as well.

## 2017-06-15 ENCOUNTER — Ambulatory Visit (INDEPENDENT_AMBULATORY_CARE_PROVIDER_SITE_OTHER): Payer: Medicare Other | Admitting: Pharmacist Clinician (PhC)/ Clinical Pharmacy Specialist

## 2017-06-15 DIAGNOSIS — I10 Essential (primary) hypertension: Secondary | ICD-10-CM

## 2017-06-15 MED ORDER — HYDRALAZINE HCL 50 MG PO TABS: 50.0000 mg | ORAL_TABLET | Freq: Two times a day (BID) | ORAL | 3 refills | Status: DC

## 2017-06-15 MED ORDER — CARVEDILOL 6.25 MG PO TABS: 6.2500 mg | ORAL_TABLET | Freq: Two times a day (BID) | ORAL | 3 refills | Status: DC

## 2017-06-15 NOTE — Progress Notes (Signed)
06/15/2017 April FlockShirley A Townsend [birth year] [id number]   HPI:  [name] is a 81 y.o. female patient of Dr Herbie BaltimoreHarding, with a PMH below who presents today for hypertension clinic evaluation.  When she saw Dr. Herbie BaltimoreHarding last month her pressure was 154/64 on 6 medications.  He switched out her atenolol for carvedilol 12.5 mg bid and she presents today for follow up.    Her medical history is significant for MI (1999) with PCI to RCA, restenosis in 2012, aortic stenosis, hyperlipidemia, hypertension and back problems.  For her back she takes meloxicam daily and tramadol prn.  She is usually followed by Dr. Neita CarpSasser in [place] and we will contact them for her most recent blood work.    Per patient she takes all of her medications in the mornings.  When she was switched to carvedilol, she noted that she felt "worse" so she quit taking evening dose and has been taking 12.5 mg qd for about 2 weeks now.  Apparently she did the same with her hydralazine 100 mg sometime before that.  Also of note she is on maximum doses of benazepril and valsartan.  Dr. Herbie BaltimoreHarding notes that it may be due to albuminuria, will check labs once they arrive  Blood Pressure Goal:  130/80  Current Medications: (takes all meds each AM)  Amlodipine 2.5 mg (higher doses assoc with edema?)  Benazepril 40 mg   Carvedilol 12.5 mg qam (was prescribed bid)  Furosemide 40 mg  Hydralazine 100 mg bid qam (was prescribed bid)   Valsartan 320 mg   Family Hx:  Father - died at 7361 from MI  Mother - no heart disease  Brother - died from MI at 3036  Brother now 1682 with CABG  Sister with stents  Sister with CABG  Son with CABG at 7350  2 healthy daughters  Social Hx:  Quit 20+ years ago in 1995; no alcohol; decaf coffee - cup daily  Diet:  Mostly home cooked foods, some carry out; no added salt, some substitute with cooking  Exercise:  Back problems leave her unable to exercise  Home BP readings:  Home cuff about 701-79 years old  Reli[employer] - did not bring with her today.  With 18 home readings her average was 119/54 with a range of 89-162/43-64.    Intolerances:   Higher doses of amlodipine may cause edema (pt has HF) Wt Readings from Last 3 Encounters:  05/11/17 144 lb 6.4 oz (65.5 kg)  11/19/16 138 lb (62.6 kg)  08/23/15 152 lb 6.4 oz (69.1 kg)   BP Readings from Last 3 Encounters:  06/15/17 (!) 150/56  05/11/17 (!) 154/64  11/19/16 (!) 191/75   Pulse Readings from Last 3 Encounters:  06/15/17 64  05/11/17 69  11/19/16 77    Current Outpatient Prescriptions  Medication Sig Dispense Refill  . amLODipine (NORVASC) 2.5 MG tablet Take 1 tablet (2.5 mg total) by mouth daily. 30 tablet 10  . atorvastatin (LIPITOR) 20 MG tablet Take 20 mg by mouth daily.    . benazepril (LOTENSIN) 40 MG tablet Take 40 mg by mouth daily.      . carvedilol (COREG) 12.5 MG tablet Take 1 tablet (12.5 mg total) by mouth 2 (two) times daily. 180 tablet 3  . clopidogrel (PLAVIX) 75 MG tablet TAKE ONE (1) TABLET EACH DAY 30 tablet 11  . furosemide (LASIX) 40 MG tablet Take 1 tablet (40 mg total) by mouth daily. 90 tablet 3  . hydrALAZINE (  APRESOLINE) 100 MG tablet TAKE ONE TABLET BY MOUTH TWICE DAILY 60 tablet 11  . meloxicam (MOBIC) 7.5 MG tablet Take 7.5 mg by mouth daily.    . metFORMIN (GLUCOPHAGE-XR) 500 MG 24 hr tablet Take 500 mg by mouth daily.  0  . NITROSTAT 0.4 MG SL tablet DISSOLVE 1 TAB UNDER TOUNGE FOR CHEST PAIN. MAY REPEAT EVERY 5 MINUTES FOR 3 DOSES. IF NO RELIEF CALL 911 OR GO TO ER 25 tablet 3  . pantoprazole (PROTONIX) 40 MG tablet TAKE ONE (1) TABLET EACH DAY 30 tablet 11  . traMADol (ULTRAM) 50 MG tablet Take 1 tablet by mouth as needed.  1  . valsartan (DIOVAN) 320 MG tablet Take 320 mg by mouth daily.     . vitamin B-12 (CYANOCOBALAMIN) 1000 MCG tablet Take 1,000 mcg by mouth daily.     No current facility-administered medications for this visit.     Allergies  Allergen Reactions  . Valium Nausea And Vomiting     Past Medical History:  Diagnosis Date  . Aortic stenosis, mild 08/2011; 08/2013   a) 2D Echo Sept 2012 : Borderline LVH, EF greater than 55%. Mild to moderate TR, mild MR, very mild AS w/o AI;;b)  Echo 08/2013 Normal LV size and function. EF 65-70%. Grd 2 DD, mild AS, mild MR  . CAD S/P percutaneous coronary angioplasty 1990s, 11/28/2011    midRCA PCI 1999 (NIR BMS 3.0 mm x 32 mm); distal  RCA BMS Sept 2012 (Vision BMS 2.5 mm x 15 mm); 12/02: RCA ISR --> Cutting PTCA   . Coronary stent restenosis due to progression of disease - Cutting Balloon PTCA to RCA stent 11/30/2011   Cutting Balloon PTCA  . Diabetes mellitus type 2, controlled, without complications (HCC)   . Dyslipidemia, goal LDL below 70 11/28/2011  . Essential hypertension    On ACE inhibitor and ARB? As well as atenolol and hydralazine  . GERD (gastroesophageal reflux disease)   . H/O echocardiogram 08/2011   EF ->55%  . History of blood transfusion    without reaction  . History of nuclear stress test 2008   low risk scan  . History of: Acute coronary syndrome 11/28/2011   Dyspnea and Shoulder pain prior to adm   . Myocardial infarction (HCC) 1990s   Had PTCA then PCI on RCA    Blood pressure (!) 150/56, pulse 64.  Moderate essential hypertension Patient with hypertension noted in office visits, but home readings much lower.  She takes all of her medications (6 affecting BP) at the same time each morning, and takes her bid medications only once daily.  Today will cut her carvedilol to 6.25 mg bid and hydralazine to 50 mg bid as well as divide her other medications equally to morning and evening doses.   She is to continue with daily home BP checks and we will see her back in a month for follow up.  In the meantime will get her most recent blood work from Dr. Neita Carp to see if we can determine why she is on both ACEI and ARB.     Phillips Hay PharmD CPP [facility]

## 2017-06-15 NOTE — Assessment & Plan Note (Signed)
Patient with hypertension noted in office visits, but home readings much lower.  She takes all of her medications (6 affecting BP) at the same time each morning, and takes her bid medications only once daily.  Today will cut her carvedilol to 6.25 mg bid and hydralazine to 50 mg bid as well as divide her other medications equally to morning and evening doses.   She is to continue with daily home BP checks and we will see her back in a month for follow up.  In the meantime will get her most recent blood work from Dr. Neita CarpSasser to see if we can determine why she is on both ACEI and ARB.

## 2017-06-15 NOTE — Patient Instructions (Signed)
Return for a a follow up appointment in 1 month  Your blood pressure today is 150/56  Check your blood pressure at home daily and keep record of the readings.  Take your BP meds as follows:  AM:  Benazepril 40 mg,           Carvedilol 6.25 mg  (take 1/2 of the 12.5 mg tablet)          Hydralazine 50 mg  (take 1/2 of the 100 mg tablet)           Furosemide 40 mg            PM:  Valsartan 320 mg          Carvedilol 6.25 mg (take 1/2 of the 12.5 mg tablet)          Hydralazine 50 mg  (take 1/2 of the 12.5 mg tablet)          Amlodipine 2.5 mg    Bring all of your meds, your BP cuff and your record of home blood pressures to your next appointment.  Exercise as you're able, try to walk approximately 30 minutes per day.  Keep salt intake to a minimum, especially watch canned and prepared boxed foods.  Eat more fresh fruits and vegetables and fewer canned items.  Avoid eating in fast food restaurants.    HOW TO TAKE YOUR BLOOD PRESSURE: . Rest 5 minutes before taking your blood pressure. .  Don't smoke or drink caffeinated beverages for at least 30 minutes before. . Take your blood pressure before (not after) you eat. . Sit comfortably with your back supported and both feet on the floor (don't cross your legs). . Elevate your arm to heart level on a table or a desk. . Use the proper sized cuff. It should fit smoothly and snugly around your bare upper arm. There should be enough room to slip a fingertip under the cuff. The bottom edge of the cuff should be 1 inch above the crease of the elbow. . Ideally, take 3 measurements at one sitting and record the average.

## 2017-06-16 ENCOUNTER — Other Ambulatory Visit: Payer: Self-pay | Admitting: Cardiology

## 2017-06-29 ENCOUNTER — Other Ambulatory Visit: Payer: Self-pay | Admitting: Cardiology

## 2017-07-06 ENCOUNTER — Other Ambulatory Visit: Payer: Self-pay | Admitting: Cardiology

## 2017-07-08 DIAGNOSIS — E7800 Pure hypercholesterolemia, unspecified: Secondary | ICD-10-CM | POA: Diagnosis not present

## 2017-07-08 DIAGNOSIS — I10 Essential (primary) hypertension: Secondary | ICD-10-CM | POA: Diagnosis not present

## 2017-07-08 DIAGNOSIS — E1165 Type 2 diabetes mellitus with hyperglycemia: Secondary | ICD-10-CM | POA: Diagnosis not present

## 2017-07-08 DIAGNOSIS — E782 Mixed hyperlipidemia: Secondary | ICD-10-CM | POA: Diagnosis not present

## 2017-07-08 DIAGNOSIS — R5382 Chronic fatigue, unspecified: Secondary | ICD-10-CM | POA: Diagnosis not present

## 2017-07-10 DIAGNOSIS — E782 Mixed hyperlipidemia: Secondary | ICD-10-CM | POA: Diagnosis not present

## 2017-07-10 DIAGNOSIS — E119 Type 2 diabetes mellitus without complications: Secondary | ICD-10-CM | POA: Diagnosis not present

## 2017-07-10 DIAGNOSIS — R5383 Other fatigue: Secondary | ICD-10-CM | POA: Diagnosis not present

## 2017-07-10 DIAGNOSIS — I10 Essential (primary) hypertension: Secondary | ICD-10-CM | POA: Diagnosis not present

## 2017-07-10 DIAGNOSIS — M545 Low back pain: Secondary | ICD-10-CM | POA: Diagnosis not present

## 2017-07-16 ENCOUNTER — Ambulatory Visit (INDEPENDENT_AMBULATORY_CARE_PROVIDER_SITE_OTHER): Payer: Medicare Other | Admitting: Pharmacist

## 2017-07-16 VITALS — BP 158/60 | HR 60

## 2017-07-16 DIAGNOSIS — I5032 Chronic diastolic (congestive) heart failure: Secondary | ICD-10-CM

## 2017-07-16 DIAGNOSIS — I110 Hypertensive heart disease with heart failure: Secondary | ICD-10-CM

## 2017-07-16 NOTE — Patient Instructions (Addendum)
Return for a  follow up appointment in 8 weeks  Your blood pressure today is 158/60 pulse 60  Check your blood pressure at home daily (if able) and keep record of the readings.  Take your BP meds as follows:            AM:  Benazepril 40 mg                      Carvedilol 6.25 mg  (take 1/2 of the 12.5 mg tablet)                     Hydralazine 50 mg  (take 1/2 of the 100 mg tablet)                     Furosemide 40 mg daily as needed for swelling or systolic BP >160                                  PM:  Carvedilol 6.25 mg (take 1/2 of the 12.5 mg tablet)                     Hydralazine 50 mg  (take 1/2 of the 12.5 mg tablet)                       Bring your BP cuff and your record of home blood pressures to your next appointment.  Exercise as you're able, try to walk approximately 30 minutes per day.  Keep salt intake to a minimum, especially watch canned and prepared boxed foods.  Eat more fresh fruits and vegetables and fewer canned items.  Avoid eating in fast food restaurants.    HOW TO TAKE YOUR BLOOD PRESSURE: . Rest 5 minutes before taking your blood pressure. .  Don't smoke or drink caffeinated beverages for at least 30 minutes before. . Take your blood pressure before (not after) you eat. . Sit comfortably with your back supported and both feet on the floor (don't cross your legs). . Elevate your arm to heart level on a table or a desk. . Use the proper sized cuff. It should fit smoothly and snugly around your bare upper arm. There should be enough room to slip a fingertip under the cuff. The bottom edge of the cuff should be 1 inch above the crease of the elbow. . Ideally, take 3 measurements at one sitting and record the average.

## 2017-07-16 NOTE — Progress Notes (Signed)
Patient ID: [name]                 DOB: 14-May-1936                      MRN: [id number]     HPI: [name] is a 81 y.o. female referred by Dr. Herbie Patton to HTN clinic. PMH is significant for MI (1999) with PCI to RCA, restenosis in 2012, aortic stenosis, hyperlipidemia, hypertension and back problems.  When she was switched to carvedilol, she noted that she felt "worse" so she quit taking evening dose and has been taking 12.5 mg qd. During most recent BP follow up her medication was divided to 1/2 of her medication in the mornings and 1/2 in the evening to promote better compliance and tolerability.   Patient also follows up with her PCP every 4 months with most recent f/u only a week ago on 07/10/2017. Her PCP discontinued valsartan and amlodipine from her therapy. He also changed her furosemide from a schedule daily dose to PRN.  Patient presents today for BP follow up. Reports some dizziness upon standing, no falls, no swelling, and no chest pain   Current HTN meds:    AM:  Benazepril 40 mg                      Carvedilol 6.25 mg  (take 1/2 of the 12.5 mg tablet)                     Hydralazine 50 mg  (take 1/2 of the 100 mg tablet)                     Furosemide 40 mg daily PRN                                  PM:  Carvedilol 6.25 mg (take 1/2 of the 12.5 mg tablet)                     Hydralazine 50 mg  (take 1/2 of the 12.5 mg tablet)                       Previously tried: amlodipine cause lower extremity edema  BP goal: <140/90  Family History:  Father - died at 261 from MI             Mother - no heart disease             Brother - died from MI at 3936             Brother now 3582 with CABG             Sister with stents             Sister with CABG             Son with CABG at 6350             2 healthy daughters  Social History: Quit 20+ years ago in 1995; no alcohol; decaf coffee - cup daily  Diet: Mostly home cooked foods, some carry out; no added salt, some  substitute with cooking  Exercise: Sedentary due to chronic back problems  Home BP readings:  21 morning reading; average 144/67 (pulse 63-84) 8 evening readings; average 136/62 (pulse  62-81)  Wt Readings from Last 3 Encounters:  05/11/17 144 lb 6.4 oz (65.5 kg)  11/19/16 138 lb (62.6 kg)  08/23/15 152 lb 6.4 oz (69.1 kg)   BP Readings from Last 3 Encounters:  07/16/17 (!) 158/60  06/15/17 (!) 150/56  05/11/17 (!) 154/64   Pulse Readings from Last 3 Encounters:  07/16/17 60  06/15/17 64  05/11/17 69     Past Medical History:  Diagnosis Date  . Aortic stenosis, mild 08/2011; 08/2013   a) 2D Echo Sept 2012 : Borderline LVH, EF greater than 55%. Mild to moderate TR, mild MR, very mild AS w/o AI;;b)  Echo 08/2013 Normal LV size and function. EF 65-70%. Grd 2 DD, mild AS, mild MR  . CAD S/P percutaneous coronary angioplasty 1990s, 11/28/2011    midRCA PCI 1999 (NIR BMS 3.0 mm x 32 mm); distal  RCA BMS Sept 2012 (Vision BMS 2.5 mm x 15 mm); 12/02: RCA ISR --> Cutting PTCA   . Coronary stent restenosis due to progression of disease - Cutting Balloon PTCA to RCA stent 11/30/2011   Cutting Balloon PTCA  . Diabetes mellitus type 2, controlled, without complications (HCC)   . Dyslipidemia, goal LDL below 70 11/28/2011  . Essential hypertension    On ACE inhibitor and ARB? As well as atenolol and hydralazine  . GERD (gastroesophageal reflux disease)   . H/O echocardiogram 08/2011   EF ->55%  . History of blood transfusion    without reaction  . History of nuclear stress test 2008   low risk scan  . History of: Acute coronary syndrome 11/28/2011   Dyspnea and Shoulder pain prior to adm   . Myocardial infarction [facility](HCC) 1990s   Had PTCA then PCI on RCA    Current Outpatient Prescriptions on File Prior to Visit  Medication Sig Dispense Refill  . amLODipine (NORVASC) 2.5 MG tablet TAKE ONE (1) TABLET EACH DAY 90 tablet 3  . atorvastatin (LIPITOR) 20 MG tablet Take 20 mg by mouth daily.      . benazepril (LOTENSIN) 40 MG tablet Take 40 mg by mouth daily.      . carvedilol (COREG) 6.25 MG tablet Take 1 tablet (6.25 mg total) by mouth 2 (two) times daily. 180 tablet 3  . clopidogrel (PLAVIX) 75 MG tablet TAKE ONE (1) TABLET EACH DAY 90 tablet 1  . furosemide (LASIX) 40 MG tablet Take 1 tablet (40 mg total) by mouth daily. 90 tablet 3  . hydrALAZINE (APRESOLINE) 50 MG tablet Take 1 tablet (50 mg total) by mouth 2 (two) times daily. 180 tablet 3  . meloxicam (MOBIC) 7.5 MG tablet Take 7.5 mg by mouth daily.    . metFORMIN (GLUCOPHAGE-XR) 500 MG 24 hr tablet Take 500 mg by mouth daily.  0  . NITROSTAT 0.4 MG SL tablet DISSOLVE 1 TAB UNDER TOUNGE FOR CHEST PAIN. MAY REPEAT EVERY 5 MINUTES FOR 3 DOSES. IF NO RELIEF CALL 911 OR GO TO ER 25 tablet 3  . pantoprazole (PROTONIX) 40 MG tablet TAKE ONE (1) TABLET EACH DAY 30 tablet 11  . traMADol (ULTRAM) 50 MG tablet Take 1 tablet by mouth as needed.  1  . valsartan (DIOVAN) 320 MG tablet Take 320 mg by mouth daily.     . vitamin B-12 (CYANOCOBALAMIN) 1000 MCG tablet Take 1,000 mcg by mouth daily.     No current facility-administered medications on file prior to visit.     Allergies  Allergen Reactions  . Valium Nausea And  Vomiting    Blood pressure (!) 158/60, pulse 60.  Hypertensive heart disease with chronic diastolic congestive heart failure (HCC) Blood pressure remains above goal of <140/90. Her PCP discontinued valsartan and amlodipine 6 days ago and changed furosemide to PRN schedule. Noted her BP dropped 20 mmHg when measure 1 minute after standing up; therefore, conservative goal of 140-150/90 remains appropriate for this patient. Will continue current therapy with additional instructions to take furosemide PRN for swelling or BP >160. Patient to continue twice daily BP monitoring and record keeping to bring to next f/u in 8 weeks. Plan to titrate therapy after diuretic requirements know.    [name] PharmD,  BCPS [facility] [address] [place] [id number] 07/17/2017 8:22 AM

## 2017-07-17 NOTE — Assessment & Plan Note (Signed)
Blood pressure remains above goal of <140/90. Her PCP discontinued valsartan and amlodipine 6 days ago and changed furosemide to PRN schedule. Noted her BP dropped 20 mmHg when measure 1 minute after standing up; therefore, conservative goal of 140-150/90 remains appropriate for this patient. Will continue current therapy with additional instructions to take furosemide PRN for swelling or BP >160. Patient to continue twice daily BP monitoring and record keeping to bring to next f/u in 8 weeks. Plan to titrate therapy after diuretic requirements know.

## 2017-07-23 ENCOUNTER — Ambulatory Visit (INDEPENDENT_AMBULATORY_CARE_PROVIDER_SITE_OTHER): Payer: Medicare Other | Admitting: Otolaryngology

## 2017-07-23 DIAGNOSIS — H903 Sensorineural hearing loss, bilateral: Secondary | ICD-10-CM | POA: Diagnosis not present

## 2017-07-23 DIAGNOSIS — H6981 Other specified disorders of Eustachian tube, right ear: Secondary | ICD-10-CM | POA: Diagnosis not present

## 2017-07-23 DIAGNOSIS — H7201 Central perforation of tympanic membrane, right ear: Secondary | ICD-10-CM

## 2017-09-10 ENCOUNTER — Encounter: Payer: Self-pay | Admitting: Pharmacist Clinician (PhC)/ Clinical Pharmacy Specialist

## 2017-09-10 ENCOUNTER — Ambulatory Visit (INDEPENDENT_AMBULATORY_CARE_PROVIDER_SITE_OTHER): Payer: Medicare Other | Admitting: Pharmacist Clinician (PhC)/ Clinical Pharmacy Specialist

## 2017-09-10 DIAGNOSIS — I10 Essential (primary) hypertension: Secondary | ICD-10-CM

## 2017-09-10 NOTE — Progress Notes (Signed)
Patient ID: SABAH ZUCCO                 DOB: 04/21/1936                      MRN: [id number]     HPI: [name] is a 81 y.o. female referred by Dr. Herbie Baltimore to HTN clinic. PMH is significant for MI (1999) with PCI to RCA, restenosis in 2012, aortic stenosis, hyperlipidemia, hypertension and back problems.  When she was switched to carvedilol, she noted that she felt "worse" so she quit taking evening dose and has been taking 12.5 mg qd. During most recent BP follow up her medication was divided to 1/2 of her medication in the mornings and 1/2 in the evening to promote better compliance and tolerability.   Patient also follows up with her PCP every 4 months with most recent f/u only a week ago on 07/10/2017. Her PCP discontinued valsartan and amlodipine from her therapy. He also changed her furosemide from a schedule daily dose to PRN.  Patient presents today for BP follow up. Reports some dizziness upon standing, no falls, no swelling, and no chest pain   Current HTN meds:    AM:  Benazepril 40 mg                      Carvedilol 6.25 mg  (take 1/2 of the 12.5 mg tablet)                     Hydralazine 50 mg  (take 1/2 of the 100 mg tablet)                     Furosemide 40 mg daily PRN - just a few times per month at most                                  PM:  Carvedilol 6.25 mg (take 1/2 of the 12.5 mg tablet)                     Hydralazine 50 mg  (take 1/2 of the 12.5 mg tablet)                       Previously tried: amlodipine cause lower extremity edema  BP goal: <140/90  Family History:  Father - died at 15 from MI             Mother - no heart disease             Brother - died from MI at 24             Brother now 73 with CABG             Sister with stents             Sister with CABG             Son with CABG at 61             2 healthy daughters  Social History: Quit 20+ years ago in 1995; no alcohol; decaf coffee - cup daily  Diet: Mostly home cooked foods,  some carry out 3-4 times per week; no added salt, some substitute with cooking  Exercise: Sedentary due to chronic back problems  Home BP readings:  20 morning reading; average 152/71  8 evening readings; average 148/65  Wt Readings from Last 3 Encounters:  05/11/17 144 lb 6.4 oz (65.5 kg)  11/19/16 138 lb (62.6 kg)  08/23/15 152 lb 6.4 oz (69.1 kg)   BP Readings from Last 3 Encounters:  09/10/17 (!) 148/62  07/16/17 (!) 158/60  06/15/17 (!) 150/56   Pulse Readings from Last 3 Encounters:  09/10/17 72  07/16/17 60  06/15/17 64     Past Medical History:  Diagnosis Date  . Aortic stenosis, mild 08/2011; 08/2013   a) 2D Echo Sept 2012 : Borderline LVH, EF greater than 55%. Mild to moderate TR, mild MR, very mild AS w/o AI;;b)  Echo 08/2013 Normal LV size and function. EF 65-70%. Grd 2 DD, mild AS, mild MR  . CAD S/P percutaneous coronary angioplasty 1990s, 11/28/2011    midRCA PCI 1999 (NIR BMS 3.0 mm x 32 mm); distal  RCA BMS Sept 2012 (Vision BMS 2.5 mm x 15 mm); 12/02: RCA ISR --> Cutting PTCA   . Coronary stent restenosis due to progression of disease - Cutting Balloon PTCA to RCA stent 11/30/2011   Cutting Balloon PTCA  . Diabetes mellitus type 2, controlled, without complications (HCC)   . Dyslipidemia, goal LDL below 70 11/28/2011  . Essential hypertension    On ACE inhibitor and ARB? As well as atenolol and hydralazine  . GERD (gastroesophageal reflux disease)   . H/O echocardiogram 08/2011   EF ->55%  . History of blood transfusion    without reaction  . History of nuclear stress test 2008   low risk scan  . History of: Acute coronary syndrome 11/28/2011   Dyspnea and Shoulder pain prior to adm   . Myocardial infarction [facility]) 1990s   Had PTCA then PCI on RCA    Current Outpatient Prescriptions on File Prior to Visit  Medication Sig Dispense Refill  . amLODipine (NORVASC) 2.5 MG tablet TAKE ONE (1) TABLET EACH DAY 90 tablet 3  . atorvastatin (LIPITOR) 20 MG tablet  Take 20 mg by mouth daily.    . benazepril (LOTENSIN) 40 MG tablet Take 40 mg by mouth daily.      . carvedilol (COREG) 6.25 MG tablet Take 1 tablet (6.25 mg total) by mouth 2 (two) times daily. 180 tablet 3  . clopidogrel (PLAVIX) 75 MG tablet TAKE ONE (1) TABLET EACH DAY 90 tablet 1  . furosemide (LASIX) 40 MG tablet Take 1 tablet (40 mg total) by mouth daily. 90 tablet 3  . hydrALAZINE (APRESOLINE) 50 MG tablet Take 1 tablet (50 mg total) by mouth 2 (two) times daily. 180 tablet 3  . meloxicam (MOBIC) 7.5 MG tablet Take 7.5 mg by mouth daily.    . metFORMIN (GLUCOPHAGE-XR) 500 MG 24 hr tablet Take 500 mg by mouth daily.  0  . NITROSTAT 0.4 MG SL tablet DISSOLVE 1 TAB UNDER TOUNGE FOR CHEST PAIN. MAY REPEAT EVERY 5 MINUTES FOR 3 DOSES. IF NO RELIEF CALL 911 OR GO TO ER 25 tablet 3  . pantoprazole (PROTONIX) 40 MG tablet TAKE ONE (1) TABLET EACH DAY 30 tablet 11  . traMADol (ULTRAM) 50 MG tablet Take 1 tablet by mouth as needed.  1  . valsartan (DIOVAN) 320 MG tablet Take 320 mg by mouth daily.     . vitamin B-12 (CYANOCOBALAMIN) 1000 MCG tablet Take 1,000 mcg by mouth daily.     No current facility-administered medications on file prior to visit.  Allergies  Allergen Reactions  . Valium Nausea And Vomiting    Blood pressure (!) 148/62, pulse 72.  Moderate essential hypertension Patient with essential hypertension, currently uncontrolled on 3 medications.   She does note that since stopping the amlodipine 2.5 mg, she only needs furosemide a couple of times per month.  Unfortunately her average BP readings are up somewhat since her last visit.  Will have her increase the hydralazine back to 100 mg twice daily.  She should continue with home BP checks, 3-4 times per week.   If she is still not controlled at her next visit, would probably add chlorthalidone rather than going to tid dosing on hydralazine.  She is due to see Dr. Herbie [place] in November, so she will follow up with him, then we can  see her afterwards should her BP still not be to goal.     Phillips Hay PharmD CPP [facility] [address] [place] [address] 09/10/2017 2:22 PM

## 2017-09-10 NOTE — Patient Instructions (Addendum)
Return for a a follow up appointment in 6 weeks with Dr. Herbie Baltimore  Your blood pressure today is 148/62    Check your blood pressure at home several days each week and keep record of the readings.  Take your BP meds as follows:   AM:  Benazepril 40 mg                      Carvedilol 6.25 mg  (take 1/2 of the 12.5 mg tablet)                     Hydralazine 100 mg                       Furosemide 40 mg daily PRN - just a few times per month at most                                  PM:  Carvedilol 6.25 mg (take 1/2 of the 12.5 mg tablet)                     Hydralazine 100 mg     Bring all of your meds, your BP cuff and your record of home blood pressures to your next appointment.  Exercise as you're able, try to walk approximately 30 minutes per day.  Keep salt intake to a minimum, especially watch canned and prepared boxed foods.  Eat more fresh fruits and vegetables and fewer canned items.  Avoid eating in fast food restaurants.    HOW TO TAKE YOUR BLOOD PRESSURE: . Rest 5 minutes before taking your blood pressure. .  Don't smoke or drink caffeinated beverages for at least 30 minutes before. . Take your blood pressure before (not after) you eat. . Sit comfortably with your back supported and both feet on the floor (don't cross your legs). . Elevate your arm to heart level on a table or a desk. . Use the proper sized cuff. It should fit smoothly and snugly around your bare upper arm. There should be enough room to slip a fingertip under the cuff. The bottom edge of the cuff should be 1 inch above the crease of the elbow. . Ideally, take 3 measurements at one sitting and record the average.

## 2017-09-10 NOTE — Assessment & Plan Note (Signed)
Patient with essential hypertension, currently uncontrolled on 3 medications.   She does note that since stopping the amlodipine 2.5 mg, she only needs furosemide a couple of times per month.  Unfortunately her average BP readings are up somewhat since her last visit.  Will have her increase the hydralazine back to 100 mg twice daily.  She should continue with home BP checks, 3-4 times per week.   If she is still not controlled at her next visit, would probably add chlorthalidone rather than going to tid dosing on hydralazine.  She is due to see Dr. Herbie Baltimore in November, so she will follow up with him, then we can see her afterwards should her BP still not be to goal.

## 2017-09-15 ENCOUNTER — Other Ambulatory Visit: Payer: Self-pay | Admitting: Cardiology

## 2017-10-29 ENCOUNTER — Ambulatory Visit: Payer: Medicare Other | Admitting: Cardiology

## 2017-10-29 ENCOUNTER — Encounter: Payer: Self-pay | Admitting: Cardiology

## 2017-10-29 VITALS — BP 130/52 | HR 80 | Ht 65.5 in | Wt 142.0 lb

## 2017-10-29 DIAGNOSIS — I110 Hypertensive heart disease with heart failure: Secondary | ICD-10-CM | POA: Diagnosis not present

## 2017-10-29 DIAGNOSIS — I350 Nonrheumatic aortic (valve) stenosis: Secondary | ICD-10-CM

## 2017-10-29 DIAGNOSIS — I2510 Atherosclerotic heart disease of native coronary artery without angina pectoris: Secondary | ICD-10-CM | POA: Diagnosis not present

## 2017-10-29 DIAGNOSIS — E785 Hyperlipidemia, unspecified: Secondary | ICD-10-CM | POA: Diagnosis not present

## 2017-10-29 DIAGNOSIS — R600 Localized edema: Secondary | ICD-10-CM

## 2017-10-29 DIAGNOSIS — Z955 Presence of coronary angioplasty implant and graft: Secondary | ICD-10-CM

## 2017-10-29 DIAGNOSIS — I5032 Chronic diastolic (congestive) heart failure: Secondary | ICD-10-CM

## 2017-10-29 DIAGNOSIS — Z9861 Coronary angioplasty status: Secondary | ICD-10-CM | POA: Diagnosis not present

## 2017-10-29 NOTE — Progress Notes (Signed)
PCP: Estanislado Pandy, MD  Clinic Note: Chief Complaint  Patient presents with  . Follow-up    CAD, Aortic Stenosis    HPI: [name] is a 81 y.o. female with a PMH below who presents today for six-month follow-up for CAD-PCI (in the setting of inferior non-STEMI) hypertensive heart disease.  I last saw her in May 2018 and have had her f/u with our pharmacy [facility]   AMILLYA CHAVIRA was last seen on on Sept 20 by Phillips Hay, RPH-CCP in the [facility] - she noted that he PCP had d/c'd Valsartan & Amlodipine. Was converted off of amlodipine in order to avoid edema - since then she only had to use furosemide a couple times a month.  Blood pressure 147 was higher.  We thought was to potentially add chlorthalidone.  Recent Hospitalizations: n/a Jenness returns today overall doing very well.  She studies Personally Reviewed - (if available, images/films reviewed: From Epic Chart or Care Everywhere)  n/a  Interval History: Notes that her blood pressure has been much better controlled and her edema is all but nonexistent.  She has barely used any furosemide.  She indicates that her her back pain really limits her ability to walk and so she cannot really walk long distances.  She denies any exertional dyspnea or chest tightness /pressure.  No resting chest pain but she has stable baseline dyspnea, PND, or  orthopnea. She does note occasionally having some skipped beats off and on, but nothing long lasting.  No rapid irregular heartbeat rhythms. No lightheadedness, dizziness, weakness or syncope/near syncope. No TIA/amaurosis fugax symptoms. No melena, hematochezia, hematuria, or epstaxis. No claudication.  ROS: A comprehensive was performed. Review of Systems  Constitutional: Negative for malaise/fatigue.  HENT: Negative for congestion and nosebleeds.   Respiratory: Negative for cough, shortness of breath and wheezing.   Cardiovascular:       Per  HPI  Gastrointestinal: Negative for blood in stool and melena.  Genitourinary: Negative for hematuria.  Musculoskeletal: Positive for back pain and joint pain. Negative for falls.  Neurological: Negative for dizziness.  Psychiatric/Behavioral: Negative for depression and memory loss. The patient is not nervous/anxious and does not have insomnia.   All other systems reviewed and are negative.  I have reviewed and (if needed) personally updated the patient's problem list, medications, allergies, past medical and surgical history, social and family history.   Past Medical History:  Diagnosis Date  . Aortic stenosis, mild 08/2011; 08/2013   a) 2D Echo Sept 2012 : Borderline LVH, EF greater than 55%. Mild to moderate TR, mild MR, very mild AS w/o AI;;b)  Echo 08/2013 Normal LV size and function. EF 65-70%. Grd 2 DD, mild AS, mild MR  . CAD S/P percutaneous coronary angioplasty 1990s, 11/28/2011    midRCA PCI 1999 (NIR BMS 3.0 mm x 32 mm); distal  RCA BMS Sept 2012 (Vision BMS 2.5 mm x 15 mm); 12/02: RCA ISR --> Cutting PTCA   . Coronary stent restenosis due to progression of disease - Cutting Balloon PTCA to RCA stent 11/30/2011   Cutting Balloon PTCA  . Diabetes mellitus type 2, controlled, without complications (HCC)   . Dyslipidemia, goal LDL below 70 11/28/2011  . Essential hypertension    On ACE inhibitor and ARB? As well as atenolol and hydralazine  . GERD (gastroesophageal reflux disease)   . H/O echocardiogram 08/2011   EF ->55%  . History of blood transfusion    without  reaction  . History of nuclear stress test 2008   low risk scan  . History of: Acute coronary syndrome 11/28/2011   Dyspnea and Shoulder pain prior to adm   . Myocardial infarction [facility](HCC) 1990s   Had PTCA then PCI on RCA    Past Surgical History:  Procedure Laterality Date  . ABDOMINAL HYSTERECTOMY    . BREAST BIOPSY    . CARDIAC CATHETERIZATION  2012   performed for angina  . CARDIAC CATHETERIZATION    . CORONARY  ANGIOPLASTY  11/2006   Cutting Balloon of stent in the RCA  . PERCUTANEOUS CORONARY STENT INTERVENTION (PCI-S)  1999   Mid RCA - NIR DMS 3.0 mm x 30 mm, dRCA Vision BMS 2.5 mm x 15 mm   . TRANSTHORACIC ECHOCARDIOGRAM  September 2014   Normal LV size and function. EF 65-70%. Grd 2 DD, mild AS, mild MR  . TRANSTHORACIC ECHOCARDIOGRAM  11/2016   2-D Echo 12/12/2016: vigorous LV function with EF 65-70%. GR 2 DD. No RWMA. Mild to mod AS (peak gradient 35 mmHg). Mod LA dilation. Mild MR, Mod TR. PA pressures ~35-40 mmHg.  . TUBAL LIGATION       Current Meds  Medication Sig  . amLODipine (NORVASC) 2.5 MG tablet TAKE ONE (1) TABLET EACH DAY  . atorvastatin (LIPITOR) 20 MG tablet Take 20 mg by mouth daily.  . benazepril (LOTENSIN) 40 MG tablet Take 40 mg by mouth daily.    . carvedilol (COREG) 6.25 MG tablet Take 1 tablet (6.25 mg total) by mouth 2 (two) times daily.  . clopidogrel (PLAVIX) 75 MG tablet Take 1 tablet (75 mg total) by mouth daily.  . furosemide (LASIX) 40 MG tablet Take 40 mg daily as needed by mouth.  . hydrALAZINE (APRESOLINE) 50 MG tablet Take 1 tablet (50 mg total) by mouth 2 (two) times daily.  . meloxicam (MOBIC) 7.5 MG tablet Take 7.5 mg by mouth daily.  Marland Kitchen. NITROSTAT 0.4 MG SL tablet DISSOLVE 1 TAB UNDER TOUNGE FOR CHEST PAIN. MAY REPEAT EVERY 5 MINUTES FOR 3 DOSES. IF NO RELIEF CALL 911 OR GO TO ER  . pantoprazole (PROTONIX) 40 MG tablet TAKE ONE (1) TABLET EACH DAY  . traMADol (ULTRAM) 50 MG tablet Take 1 tablet by mouth as needed.  . valsartan (DIOVAN) 320 MG tablet Take 320 mg by mouth daily.   . vitamin B-12 (CYANOCOBALAMIN) 1000 MCG tablet Take 1,000 mcg by mouth daily.    Allergies  Allergen Reactions  . Valium Nausea And Vomiting    Social History   Socioeconomic History  . Marital status: Married    Spouse name: None  . Number of children: None  . Years of education: None  . Highest education level: None  Social Needs  . Financial resource strain: None   . Food insecurity - worry: None  . Food insecurity - inability: None  . Transportation needs - medical: None  . Transportation needs - non-medical: None  Occupational History  . None  Tobacco Use  . Smoking status: Former Games developermoker  . Smokeless tobacco: Never Used  . Tobacco comment: Quit approximately 1993  Substance and Sexual Activity  . Alcohol use: No  . Drug use: No  . Sexual activity: Not Currently    Birth control/protection: Post-menopausal  Other Topics Concern  . None  Social History Narrative   She is a married mother of 3, grandmother to. 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 Quit smoking in '99, denies  alcohol.   She volunteers for [employer].    family history includes Cancer in her mother; Heart Problems in her brother; Heart disease in her brother, father, maternal grandfather, and maternal grandmother; Nephrolithiasis in her brother.  Wt Readings from Last 3 Encounters:  10/29/17 142 lb (64.4 kg)  05/11/17 144 lb 6.4 oz (65.5 kg)  11/19/16 138 lb (62.6 kg)    PHYSICAL EXAM BP (!) 130/52   Pulse 80   Ht 5' 5.5" (1.664 m)   Wt 142 lb (64.4 kg)   SpO2 98%   BMI 23.27 kg/m  Physical Exam  Constitutional: She is oriented to person, place, and time. She appears well-developed and well-nourished. No distress.  Relatively healthy appearing  HENT:  Head: Normocephalic and atraumatic.  Eyes: EOM are normal.  Neck: No JVD present.  Cardiovascular: Normal rate, regular rhythm, S1 normal and S2 normal. Exam reveals no gallop and no friction rub.  Murmur heard. High-pitched harsh crescendo-decrescendo midsystolic murmur is present with a grade of 2/6 at the upper right sternal border radiating to the neck.  Holosystolic murmur of grade 2/6 is also present at the lower left sternal border radiating to the apex. Pulmonary/Chest: Effort normal and breath sounds normal. No respiratory distress. She has no wheezes. She has no rales.  Abdominal: Soft.  Bowel sounds are normal. She exhibits no distension. There is no tenderness. There is no rebound.  Musculoskeletal: Normal range of motion. She exhibits no edema or deformity.  Neurological: She is alert and oriented to person, place, and time.  Skin: Skin is warm and dry. No rash noted. No erythema.  Psychiatric: She has a normal mood and affect. Her behavior is normal. Judgment and thought content normal.  Nursing note and vitals reviewed.    Adult ECG Report N/a  Other studies Reviewed: Additional studies/ records that were reviewed today include:  Recent Labs:  Followed by PCP.  ASSESSMENT / PLAN: Problem List Items Addressed This Visit    Aortic stenosis, moderate (Chronic)    Most recent echo showed mild-moderate left ear along with some mitral regurgitation as well.  She would be due for follow-up echocardiogram before her one-year follow-up visit.      Relevant Medications   furosemide (LASIX) 40 MG tablet   Other Relevant Orders   ECHOCARDIOGRAM COMPLETE   CAD S/P percutaneous coronary angioplasty -- PCI with BMS to RCA followed by POBA of ISR - Primary (Chronic)    Doing well - no active angina symptoms.  She is on a Beta Blocker along with Plavix & Statin.  Calcium channel blocker was d/c'd due to edema.      Relevant Medications   furosemide (LASIX) 40 MG tablet   Other Relevant Orders   ECHOCARDIOGRAM COMPLETE   Dyslipidemia, goal LDL below 70 (Chronic)    On atorvastatin steady dose.  Monitored by PCP.  I do not have labs to review.      Relevant Medications   furosemide (LASIX) 40 MG tablet   Hypertensive heart disease with chronic diastolic congestive heart failure (HCC) (Chronic)    Well controlled blood pressure today - seems euvolemic with no standing dose of diuretic. On stable dose of carvedilol and benazepril.  Amlodipine was converted to hydralazine 100 mg twice daily.  No heart failure symptoms at this point.  Continue current regimen.       Relevant Medications   furosemide (LASIX) 40 MG tablet   Other Relevant Orders   ECHOCARDIOGRAM COMPLETE  Lower extremity edema (Chronic)    Continue to recommend support stockings. Edema seems to be better without the amlodipine.  No longer requiring standing dose of Lasix.      Presence of stent in right coronary artery, HSRA for ISR in 2012 (Chronic)      Current medicines are reviewed at length with the patient today. (+/- concerns) n/a The following changes have been made: n/a  Patient Instructions  NO CHANGE WITH CURRENT MEDICATIONS   PLEASE HAVE PRIMARY SEND A COPY OF LABS.  SCHEDULE AT [address] IN NOV 2019 Your physician has requested that you have an echocardiogram. Echocardiography is a painless test that uses sound waves to create images of your heart. It provides your doctor with information about the size and shape of your heart and how well your heart's chambers and valves are working. This procedure takes approximately one hour. There are no restrictions for this procedure.   Your physician wants you to follow-up in 12 MONTHS WITH DR HARDING  AFTER ECHO IS COMPLETED.You will receive a reminder letter in the mail two months in advance. If you don't receive a letter, please call our office to schedule the follow-up appointment.   If you need a refill on your cardiac medications before your next appointment, please call your pharmacy.    Studies Ordered:   Orders Placed This Encounter  Procedures  . ECHOCARDIOGRAM COMPLETE      Bryan Lemma, M.D., M.S. Interventional Cardiologist   Pager # [phone] Phone # [phone] [address] [place], [place] [address]

## 2017-10-29 NOTE — Patient Instructions (Signed)
NO CHANGE WITH CURRENT MEDICATIONS   PLEASE HAVE PRIMARY SEND A COPY OF LABS.  SCHEDULE AT [address] IN NOV 2019 Your physician has requested that you have an echocardiogram. Echocardiography is a painless test that uses sound waves to create images of your heart. It provides your doctor with information about the size and shape of your heart and how well your heart's chambers and valves are working. This procedure takes approximately one hour. There are no restrictions for this procedure.   Your physician wants you to follow-up in 12 MONTHS WITH DR HARDING  AFTER ECHO IS COMPLETED.You will receive a reminder letter in the mail two months in advance. If you don't receive a letter, please call our office to schedule the follow-up appointment.   If you need a refill on your cardiac medications before your next appointment, please call your pharmacy.

## 2017-10-30 DIAGNOSIS — Z23 Encounter for immunization: Secondary | ICD-10-CM | POA: Diagnosis not present

## 2017-10-30 DIAGNOSIS — R5382 Chronic fatigue, unspecified: Secondary | ICD-10-CM | POA: Diagnosis not present

## 2017-10-30 DIAGNOSIS — E1165 Type 2 diabetes mellitus with hyperglycemia: Secondary | ICD-10-CM | POA: Diagnosis not present

## 2017-10-30 DIAGNOSIS — E782 Mixed hyperlipidemia: Secondary | ICD-10-CM | POA: Diagnosis not present

## 2017-10-30 DIAGNOSIS — E7800 Pure hypercholesterolemia, unspecified: Secondary | ICD-10-CM | POA: Diagnosis not present

## 2017-10-30 DIAGNOSIS — I10 Essential (primary) hypertension: Secondary | ICD-10-CM | POA: Diagnosis not present

## 2017-10-31 ENCOUNTER — Encounter: Payer: Self-pay | Admitting: Cardiology

## 2017-10-31 NOTE — Assessment & Plan Note (Signed)
Most recent echo showed mild-moderate left ear along with some mitral regurgitation as well.  She would be due for follow-up echocardiogram before her one-year follow-up visit.

## 2017-10-31 NOTE — Assessment & Plan Note (Signed)
Well controlled blood pressure today - seems euvolemic with no standing dose of diuretic. On stable dose of carvedilol and benazepril.  Amlodipine was converted to hydralazine 100 mg twice daily.  No heart failure symptoms at this point.  Continue current regimen.

## 2017-10-31 NOTE — Assessment & Plan Note (Signed)
Doing well - no active angina symptoms.  She is on a Beta Blocker along with Plavix & Statin.  Calcium channel blocker was d/c'd due to edema.

## 2017-10-31 NOTE — Assessment & Plan Note (Signed)
Continue to recommend support stockings. Edema seems to be better without the amlodipine.  No longer requiring standing dose of Lasix.

## 2017-10-31 NOTE — Assessment & Plan Note (Signed)
On atorvastatin steady dose.  Monitored by PCP.  I do not have labs to review.

## 2017-11-05 DIAGNOSIS — E782 Mixed hyperlipidemia: Secondary | ICD-10-CM | POA: Diagnosis not present

## 2017-11-05 DIAGNOSIS — I10 Essential (primary) hypertension: Secondary | ICD-10-CM | POA: Diagnosis not present

## 2017-11-05 DIAGNOSIS — Z0000 Encounter for general adult medical examination without abnormal findings: Secondary | ICD-10-CM | POA: Diagnosis not present

## 2017-11-05 DIAGNOSIS — E119 Type 2 diabetes mellitus without complications: Secondary | ICD-10-CM | POA: Diagnosis not present

## 2017-11-05 DIAGNOSIS — M545 Low back pain: Secondary | ICD-10-CM | POA: Diagnosis not present

## 2017-11-05 DIAGNOSIS — M19041 Primary osteoarthritis, right hand: Secondary | ICD-10-CM | POA: Diagnosis not present

## 2017-11-09 ENCOUNTER — Other Ambulatory Visit: Payer: Self-pay | Admitting: Cardiology

## 2017-11-18 DIAGNOSIS — H40033 Anatomical narrow angle, bilateral: Secondary | ICD-10-CM | POA: Diagnosis not present

## 2017-11-18 DIAGNOSIS — H2513 Age-related nuclear cataract, bilateral: Secondary | ICD-10-CM | POA: Diagnosis not present

## 2017-11-19 ENCOUNTER — Ambulatory Visit: Payer: Medicare Other | Admitting: Cardiology

## 2017-12-25 ENCOUNTER — Encounter: Payer: Self-pay | Admitting: Internal Medicine

## 2017-12-25 DIAGNOSIS — R5383 Other fatigue: Secondary | ICD-10-CM | POA: Diagnosis not present

## 2017-12-26 DIAGNOSIS — D509 Iron deficiency anemia, unspecified: Secondary | ICD-10-CM | POA: Diagnosis not present

## 2017-12-28 DIAGNOSIS — D509 Iron deficiency anemia, unspecified: Secondary | ICD-10-CM | POA: Diagnosis not present

## 2018-01-28 ENCOUNTER — Ambulatory Visit (INDEPENDENT_AMBULATORY_CARE_PROVIDER_SITE_OTHER): Payer: Medicare Other | Admitting: Otolaryngology

## 2018-01-28 DIAGNOSIS — H6983 Other specified disorders of Eustachian tube, bilateral: Secondary | ICD-10-CM | POA: Diagnosis not present

## 2018-03-09 DIAGNOSIS — E1165 Type 2 diabetes mellitus with hyperglycemia: Secondary | ICD-10-CM | POA: Diagnosis not present

## 2018-03-09 DIAGNOSIS — I10 Essential (primary) hypertension: Secondary | ICD-10-CM | POA: Diagnosis not present

## 2018-03-09 DIAGNOSIS — R5382 Chronic fatigue, unspecified: Secondary | ICD-10-CM | POA: Diagnosis not present

## 2018-03-09 DIAGNOSIS — E7800 Pure hypercholesterolemia, unspecified: Secondary | ICD-10-CM | POA: Diagnosis not present

## 2018-03-09 DIAGNOSIS — E782 Mixed hyperlipidemia: Secondary | ICD-10-CM | POA: Diagnosis not present

## 2018-03-16 DIAGNOSIS — M545 Low back pain: Secondary | ICD-10-CM | POA: Diagnosis not present

## 2018-03-16 DIAGNOSIS — R5383 Other fatigue: Secondary | ICD-10-CM | POA: Diagnosis not present

## 2018-03-16 DIAGNOSIS — E782 Mixed hyperlipidemia: Secondary | ICD-10-CM | POA: Diagnosis not present

## 2018-03-16 DIAGNOSIS — E119 Type 2 diabetes mellitus without complications: Secondary | ICD-10-CM | POA: Diagnosis not present

## 2018-03-16 DIAGNOSIS — I10 Essential (primary) hypertension: Secondary | ICD-10-CM | POA: Diagnosis not present

## 2018-04-30 DIAGNOSIS — M545 Low back pain: Secondary | ICD-10-CM | POA: Diagnosis not present

## 2018-04-30 DIAGNOSIS — M5432 Sciatica, left side: Secondary | ICD-10-CM | POA: Diagnosis not present

## 2018-05-25 ENCOUNTER — Encounter: Payer: Self-pay | Admitting: Internal Medicine

## 2018-05-25 DIAGNOSIS — R5383 Other fatigue: Secondary | ICD-10-CM | POA: Diagnosis not present

## 2018-05-25 DIAGNOSIS — D649 Anemia, unspecified: Secondary | ICD-10-CM | POA: Diagnosis not present

## 2018-05-25 DIAGNOSIS — K921 Melena: Secondary | ICD-10-CM | POA: Diagnosis not present

## 2018-06-01 ENCOUNTER — Encounter (INDEPENDENT_AMBULATORY_CARE_PROVIDER_SITE_OTHER): Payer: Self-pay | Admitting: Internal Medicine

## 2018-06-01 ENCOUNTER — Ambulatory Visit (INDEPENDENT_AMBULATORY_CARE_PROVIDER_SITE_OTHER): Payer: Medicare Other | Admitting: Internal Medicine

## 2018-06-01 ENCOUNTER — Encounter (INDEPENDENT_AMBULATORY_CARE_PROVIDER_SITE_OTHER): Payer: Self-pay | Admitting: *Deleted

## 2018-06-01 VITALS — BP 154/62 | HR 68 | Temp 98.3°F | Ht 66.0 in | Wt 145.1 lb

## 2018-06-01 DIAGNOSIS — K921 Melena: Secondary | ICD-10-CM | POA: Insufficient documentation

## 2018-06-01 MED ORDER — PANTOPRAZOLE SODIUM 40 MG PO TBEC: 40.0000 mg | DELAYED_RELEASE_TABLET | Freq: Every day | ORAL | 3 refills | Status: DC

## 2018-06-01 NOTE — Progress Notes (Addendum)
Subjective:    Patient ID: [name], female    DOB: [birth year], 82 y.o.   MRN: [id number] PCP Dr. Neita Carp.  HPI Referred by Lawerance Sabal PA for melena.  05/26/2018 H and H  8.9 and 26.7, MCV 97, Ferritin 50, Iron Sat 20, Iron 58, UIBC 231, TIBC 289.    EGD/Colonoscopy was in 2013 by Dr. Teena Dunk (anemia) EGD: esophagitis was found, very mild, distally.  Colonoscopy: normal colonoscopy   Apparently started have melena around 05/23/2018 x 2 days. No further melena. She says her stools are brown now. Plavix on hold.  She says she feels pretty good. Her appetite is good. No weight loss.  She tells me she has had anemia in the past and was seen at the [facility]. She received Iron transfusion x 2 maybe 6 yrs ago.   She says around [facility], she had some tarry stools. Saw Dr. Neita Carp and her hemoglobin was 5.3.  Received 2 units of blood.    Maintained on Plavix and is on hold x 1 weeks. She has a cardiac stent since 2012. Aortic stenosis. Hx of MI in the past    02/20/2018  H and H 12.0 and 36.7  Mother died of liver and pancreatic cancer.   Review of Systems Past Medical History:  Diagnosis Date  . Aortic stenosis, mild 08/2011; 08/2013   a) 2D Echo Sept 2012 : Borderline LVH, EF greater than 55%. Mild to moderate TR, mild MR, very mild AS w/o AI;;b)  Echo 08/2013 Normal LV size and function. EF 65-70%. Grd 2 DD, mild AS, mild MR  . CAD S/P percutaneous coronary angioplasty 1990s, 11/28/2011    midRCA PCI 1999 (NIR BMS 3.0 mm x 32 mm); distal  RCA BMS Sept 2012 (Vision BMS 2.5 mm x 15 mm); 12/02: RCA ISR --> Cutting PTCA   . Coronary stent restenosis due to progression of disease - Cutting Balloon PTCA to RCA stent 11/30/2011   Cutting Balloon PTCA  . Diabetes mellitus type 2, controlled, without complications (HCC)   . Dyslipidemia, goal LDL below 70 11/28/2011  . Essential hypertension    On ACE inhibitor and ARB? As well as atenolol and hydralazine  . GERD  (gastroesophageal reflux disease)   . H/O echocardiogram 08/2011   EF ->55%  . History of blood transfusion    without reaction  . History of nuclear stress test 2008   low risk scan  . History of: Acute coronary syndrome 11/28/2011   Dyspnea and Shoulder pain prior to adm   . Myocardial infarction [facility]) 1990s   Had PTCA then PCI on RCA    Past Surgical History:  Procedure Laterality Date  . ABDOMINAL HYSTERECTOMY    . BREAST BIOPSY    . CARDIAC CATHETERIZATION  2012   performed for angina  . CARDIAC CATHETERIZATION    . CORONARY ANGIOPLASTY  11/2006   Cutting Balloon of stent in the RCA  . LEFT HEART CATHETERIZATION WITH CORONARY ANGIOGRAM N/A 11/27/2011   Procedure: LEFT HEART CATHETERIZATION WITH CORONARY ANGIOGRAM;  Surgeon: Marykay Lex, MD;  Location: [facility] CATH LAB;  Service: Cardiovascular;  Laterality: N/A;  . PERCUTANEOUS CORONARY STENT INTERVENTION (PCI-S)  1999   Mid RCA - NIR DMS 3.0 mm x 30 mm, dRCA Vision BMS 2.5 mm x 15 mm   . TRANSTHORACIC ECHOCARDIOGRAM  September 2014   Normal LV size and function. EF 65-70%. Grd 2 DD, mild AS, mild MR  . TRANSTHORACIC ECHOCARDIOGRAM  11/2016   2-D Echo 12/12/2016: vigorous LV function with EF 65-70%. GR 2 DD. No RWMA. Mild to mod AS (peak gradient 35 mmHg). Mod LA dilation. Mild MR, Mod TR. PA pressures ~35-40 mmHg.  . TUBAL LIGATION      Allergies  Allergen Reactions  . Valium Nausea And Vomiting    Current Outpatient Medications on File Prior to Visit  Medication Sig Dispense Refill  . atorvastatin (LIPITOR) 20 MG tablet Take 20 mg by mouth daily.    . benazepril (LOTENSIN) 40 MG tablet Take 40 mg by mouth daily.      . carvedilol (COREG) 6.25 MG tablet Take 1 tablet (6.25 mg total) by mouth 2 (two) times daily. (Patient taking differently: Take 12.5 mg by mouth 2 (two) times daily. ) 180 tablet 3  . clopidogrel (PLAVIX) 75 MG tablet Take 1 tablet (75 mg total) by mouth daily. 90 tablet 1  . furosemide (LASIX) 40 MG  tablet Take 40 mg daily as needed by mouth.    . hydrALAZINE (APRESOLINE) 100 MG tablet TAKE ONE TABLET BY MOUTH TWICE DAILY 180 tablet 1  . meloxicam (MOBIC) 7.5 MG tablet Take 7.5 mg by mouth daily.    Marland Kitchen. NITROSTAT 0.4 MG SL tablet DISSOLVE 1 TAB UNDER TOUNGE FOR CHEST PAIN. MAY REPEAT EVERY 5 MINUTES FOR 3 DOSES. IF NO RELIEF CALL 911 OR GO TO ER 25 tablet 3  . traMADol (ULTRAM) 50 MG tablet Take 1 tablet by mouth as needed.  1  . vitamin B-12 (CYANOCOBALAMIN) 1000 MCG tablet Take 1,000 mcg by mouth daily.    . valsartan (DIOVAN) 320 MG tablet Take 320 mg by mouth daily.      No current facility-administered medications on file prior to visit.         Objective:   Physical Exam Blood pressure (!) 154/62, pulse 68, temperature 98.3 F (36.8 C), height 5\' 6"  (1.676 m), weight 145 lb 1.6 oz (65.8 kg). Alert and oriented. Skin warm and dry. Oral mucosa is moist.   . Sclera anicteric, conjunctivae is pink. Thyroid not enlarged. No cervical lymphadenopathy. Lungs clear. Heart regular rate and rhythm.  Abdomen is soft. Bowel sounds are positive. No hepatomegaly. No abdominal masses felt. No tenderness.  No edema to lower extremities. Stool brown and guaiac negative.          Assessment & Plan:  Melena. Needs EGD to rule out PUD. Plavix on hold.  She will have blood work tomorrow and she will have copy sent to our office.  Rx for Protonix sent to her pharmacy.

## 2018-06-01 NOTE — Patient Instructions (Signed)
The risks of bleeding, perforation and infection were reviewed with patient.  

## 2018-06-02 DIAGNOSIS — E119 Type 2 diabetes mellitus without complications: Secondary | ICD-10-CM | POA: Diagnosis not present

## 2018-06-02 DIAGNOSIS — I10 Essential (primary) hypertension: Secondary | ICD-10-CM | POA: Diagnosis not present

## 2018-06-02 DIAGNOSIS — R5383 Other fatigue: Secondary | ICD-10-CM | POA: Diagnosis not present

## 2018-06-02 DIAGNOSIS — M545 Low back pain: Secondary | ICD-10-CM | POA: Diagnosis not present

## 2018-06-02 DIAGNOSIS — E782 Mixed hyperlipidemia: Secondary | ICD-10-CM | POA: Diagnosis not present

## 2018-06-03 ENCOUNTER — Encounter (HOSPITAL_COMMUNITY): Payer: Self-pay | Admitting: *Deleted

## 2018-06-03 ENCOUNTER — Encounter (HOSPITAL_COMMUNITY)
Admission: RE | Disposition: A | Discharge status: Home / Self Care | Payer: Self-pay | Source: Ambulatory Visit | Attending: Internal Medicine

## 2018-06-03 ENCOUNTER — Ambulatory Visit (HOSPITAL_COMMUNITY)
Admission: RE | Admit: 2018-06-03 | Discharge: 2018-06-03 | Disposition: A | Discharge status: Home / Self Care | Payer: Medicare Other | Source: Ambulatory Visit | Attending: Internal Medicine | Admitting: Internal Medicine

## 2018-06-03 ENCOUNTER — Other Ambulatory Visit: Payer: Self-pay

## 2018-06-03 DIAGNOSIS — K921 Melena: Secondary | ICD-10-CM | POA: Diagnosis not present

## 2018-06-03 DIAGNOSIS — K259 Gastric ulcer, unspecified as acute or chronic, without hemorrhage or perforation: Secondary | ICD-10-CM | POA: Insufficient documentation

## 2018-06-03 DIAGNOSIS — I2510 Atherosclerotic heart disease of native coronary artery without angina pectoris: Secondary | ICD-10-CM | POA: Diagnosis not present

## 2018-06-03 DIAGNOSIS — Z791 Long term (current) use of non-steroidal anti-inflammatories (NSAID): Secondary | ICD-10-CM | POA: Diagnosis not present

## 2018-06-03 DIAGNOSIS — E785 Hyperlipidemia, unspecified: Secondary | ICD-10-CM | POA: Insufficient documentation

## 2018-06-03 DIAGNOSIS — K449 Diaphragmatic hernia without obstruction or gangrene: Secondary | ICD-10-CM | POA: Insufficient documentation

## 2018-06-03 DIAGNOSIS — D500 Iron deficiency anemia secondary to blood loss (chronic): Secondary | ICD-10-CM | POA: Diagnosis not present

## 2018-06-03 DIAGNOSIS — K210 Gastro-esophageal reflux disease with esophagitis: Secondary | ICD-10-CM | POA: Insufficient documentation

## 2018-06-03 DIAGNOSIS — I10 Essential (primary) hypertension: Secondary | ICD-10-CM | POA: Diagnosis not present

## 2018-06-03 DIAGNOSIS — K3189 Other diseases of stomach and duodenum: Secondary | ICD-10-CM | POA: Insufficient documentation

## 2018-06-03 DIAGNOSIS — Z7902 Long term (current) use of antithrombotics/antiplatelets: Secondary | ICD-10-CM | POA: Insufficient documentation

## 2018-06-03 DIAGNOSIS — Z87891 Personal history of nicotine dependence: Secondary | ICD-10-CM | POA: Insufficient documentation

## 2018-06-03 DIAGNOSIS — E119 Type 2 diabetes mellitus without complications: Secondary | ICD-10-CM | POA: Insufficient documentation

## 2018-06-03 DIAGNOSIS — I252 Old myocardial infarction: Secondary | ICD-10-CM | POA: Insufficient documentation

## 2018-06-03 DIAGNOSIS — Z955 Presence of coronary angioplasty implant and graft: Secondary | ICD-10-CM | POA: Diagnosis not present

## 2018-06-03 DIAGNOSIS — Z79899 Other long term (current) drug therapy: Secondary | ICD-10-CM | POA: Insufficient documentation

## 2018-06-03 HISTORY — PX: ESOPHAGOGASTRODUODENOSCOPY: SHX5428

## 2018-06-03 HISTORY — PX: BIOPSY: SHX5522

## 2018-06-03 SURGERY — EGD (ESOPHAGOGASTRODUODENOSCOPY)
Anesthesia: Moderate Sedation

## 2018-06-03 MED ORDER — PANTOPRAZOLE SODIUM 40 MG PO TBEC: 40.0000 mg | DELAYED_RELEASE_TABLET | Freq: Two times a day (BID) | ORAL | 3 refills | Status: DC

## 2018-06-03 MED ORDER — MEPERIDINE HCL 50 MG/ML IJ SOLN
INTRAMUSCULAR | Status: DC | PRN
  Administered 2018-06-03 (×2): 25 mg via INTRAVENOUS

## 2018-06-03 MED ORDER — CLOPIDOGREL BISULFATE 75 MG PO TABS: 75.0000 mg | ORAL_TABLET | Freq: Every day | ORAL | 1 refills | Status: DC

## 2018-06-03 MED ORDER — STERILE WATER FOR IRRIGATION IR SOLN
Status: DC | PRN
  Administered 2018-06-03: 08:00:00

## 2018-06-03 MED ORDER — ENALAPRILAT 1.25 MG/ML IV SOLN
INTRAVENOUS | Status: DC | PRN
  Administered 2018-06-03: 1.25 mg via INTRAVENOUS

## 2018-06-03 MED ORDER — MIDAZOLAM HCL 5 MG/5ML IJ SOLN
INTRAMUSCULAR | Status: AC
  Filled 2018-06-03: qty 10

## 2018-06-03 MED ORDER — MEPERIDINE HCL 50 MG/ML IJ SOLN
INTRAMUSCULAR | Status: AC
  Filled 2018-06-03: qty 1

## 2018-06-03 MED ORDER — LIDOCAINE VISCOUS HCL 2 % MT SOLN
OROMUCOSAL | Status: AC
  Filled 2018-06-03: qty 15

## 2018-06-03 MED ORDER — MIDAZOLAM HCL 5 MG/5ML IJ SOLN
INTRAMUSCULAR | Status: DC | PRN
  Administered 2018-06-03: 2 mg via INTRAVENOUS
  Administered 2018-06-03: 1 mg via INTRAVENOUS
  Administered 2018-06-03: 2 mg via INTRAVENOUS

## 2018-06-03 MED ORDER — ENALAPRILAT 1.25 MG/ML IV SOLN
INTRAVENOUS | Status: AC
  Filled 2018-06-03: qty 2

## 2018-06-03 MED ORDER — LIDOCAINE VISCOUS HCL 2 % MT SOLN
OROMUCOSAL | Status: DC | PRN
  Administered 2018-06-03: 4 mL via OROMUCOSAL

## 2018-06-03 MED ORDER — SODIUM CHLORIDE 0.9 % IV SOLN
INTRAVENOUS | Status: DC
  Administered 2018-06-03: 1000 mL via INTRAVENOUS

## 2018-06-03 NOTE — Discharge Instructions (Signed)
Resume clopidogrel in 1 week. Take meloxicam or similar medications. Resume other medications as before. Pantoprazole increased to 40 mg by mouth twice daily.  Take a 30 minutes before breakfast and evening meal daily. Resume usual diet. No driving for 24 hours. Physician will call with biopsy results.  Gastrointestinal Endoscopy, Care After Refer to this sheet in the next few weeks. These instructions provide you with information about caring for yourself after your procedure. Your health care provider may also give you more specific instructions. Your treatment has been planned according to current medical practices, but problems sometimes occur. Call your health care provider if you have any problems or questions after your procedure. What can I expect after the procedure? After your procedure, it is common to feel:  Bloated.  Soreness in your throat.  Sleepy.  Follow these instructions at home:  Do not drive for 24 hours if you received a if you received a medicine to help you relax (sedative).  Avoid drinking warm beverages and alcohol for the first 24 hours after the procedure.  Take over-the-counter and prescription medicines only as told by your health care provider.  Drink enough fluids to keep your urine clear or pale yellow.  If you feel bloated, try going for a walk. Walking may help the feeling go away.  If your throat is sore, try gargling with salt water. Get help right away if:  You have severe nausea or vomiting.  You have severe abdominal pain, abdominal cramps that last longer than 6 hours, or abdominal swelling.  You have severe shoulder or back pain.  You have trouble swallowing.  You have shortness of breath, your breathing is shallow, or you breathing is faster than normal.  You have a fever.  Your heart is beating very fast.  You vomit blood or material that looks like coffee grounds.  You have bloody, black, or tarry stools. This information  is not intended to replace advice given to you by your health care provider. Make sure you discuss any questions you have with your health care provider.

## 2018-06-03 NOTE — Op Note (Signed)
[facility] Patient Name: April AuroraShirley Patton Procedure Date: 06/03/2018 7:07 AM MRN: [id number] Date of Birth: February 25, 1936 Attending MD: Lionel DecemberNajeeb Elion Hocker , MD CSN: [id number] Age: [age] Admit Type: Outpatient Procedure:                Upper GI endoscopy Indications:              Iron deficiency anemia secondary to chronic blood                            loss, Melena Providers:                Lionel DecemberNajeeb Faun Mcqueen, MD, Loma MessingLurae B. Patsy LagerAlbert RN, RN, Dyann Ruddleonya                            Wilson Referring MD:             Estanislado PandyPaul W. Sasser MD, MD Medicines:                Lidocaine spray, Meperidine 50 mg IV, Midazolam 5                            mg IV Complications:            No immediate complications. Estimated Blood Loss:     Estimated blood loss was minimal. Procedure:                Pre-Anesthesia Assessment:                           - Prior to the procedure, a History and Physical                            was performed, and patient medications and                            allergies were reviewed. The patient's tolerance of                            previous anesthesia was also reviewed. The risks                            and benefits of the procedure and the sedation                            options and risks were discussed with the patient.                            All questions were answered, and informed consent                            was obtained. Prior Anticoagulants: The patient                            last took Plavix (clopidogrel) 10 days and previous  NSAID medication 10 days prior to the procedure.                            ASA Grade Assessment: III - A patient with severe                            systemic disease. After reviewing the risks and                            benefits, the patient was deemed in satisfactory                            condition to undergo the procedure.                           After obtaining informed consent, the  endoscope was                            passed under direct vision. Throughout the                            procedure, the patient's blood pressure, pulse, and                            oxygen saturations were monitored continuously. The                            EG-[id number] ([id number]) scope was introduced through the                            mouth, and advanced to the second part of duodenum.                            The upper GI endoscopy was accomplished without                            difficulty. The patient tolerated the procedure                            well. Scope In: 7:49:05 AM Scope Out: 7:57:02 AM Total Procedure Duration: 0 hours 7 minutes 57 seconds  Findings:      LA Grade A (one or more mucosal breaks less than 5 mm, not extending       between tops of 2 mucosal folds) esophagitis was found 36 cm from the       incisors.      A 3 cm hiatal hernia was present.      Two non-bleeding cratered gastric ulcers were found in the gastric       antrum. The largest lesion was 7 mm in largest dimension. This was       biopsied with a cold forceps for histology.      A few erosions were found in the gastric antrum and in the prepyloric       region of the stomach.      The exam  of the stomach was otherwise normal.      The duodenal bulb and second portion of the duodenum were normal. Impression:               - LA Grade A reflux esophagitis.                           - 3 cm hiatal hernia.                           - Non-bleeding gastric ulcers. Biopsied.                           - Erosive gastropathy.                           - Normal duodenal bulb and second portion of the                            duodenum. Moderate Sedation:      Moderate (conscious) sedation was administered by the endoscopy nurse       and supervised by the endoscopist. The following parameters were       monitored: oxygen saturation, heart rate, blood pressure, CO2       capnography and  response to care. Total physician intraservice time was       15 minutes. Recommendation:           - Patient has a contact number available for                            emergencies. The signs and symptoms of potential                            delayed complications were discussed with the                            patient. Return to normal activities tomorrow.                            Written discharge instructions were provided to the                            patient.                           - Resume previous diet today.                           - Continue present medications.                           - Resume Plavix (clopidogrel) at prior dose in 1                            week.                           - Await  pathology results.                           - No NSAIDS.                           - Repeat upper endoscopy in 3 months to check                            healing. Procedure Code(s):        --- Professional ---                           [phone], Esophagogastroduodenoscopy, flexible,                            transoral; with biopsy, single or multiple                           G0500, Moderate sedation services provided by the                            same physician or other qualified health care                            professional performing a gastrointestinal                            endoscopic service that sedation supports,                            requiring the presence of an independent trained                            observer to assist in the monitoring of the                            patient's level of consciousness and physiological                            status; initial 15 minutes of intra-service time;                            patient age 9 years or older (additional time may                            be reported with 60454, as appropriate) Diagnosis Code(s):        --- Professional ---                           K21.0,  Gastro-esophageal reflux disease with                            esophagitis  K44.9, Diaphragmatic hernia without obstruction or                            gangrene                           K25.9, Gastric ulcer, unspecified as acute or                            chronic, without hemorrhage or perforation                           K31.89, Other diseases of stomach and duodenum                           D50.0, Iron deficiency anemia secondary to blood                            loss (chronic)                           K92.1, Melena (includes Hematochezia) CPT copyright 2017 [employer]. All rights reserved. The codes documented in this report are preliminary and upon coder review may  be revised to meet current compliance requirements. Lionel December, MD Lionel December, MD 06/03/2018 8:12:51 AM This report has been signed electronically. Number of Addenda: 0

## 2018-06-03 NOTE — H&P (Addendum)
[name] is an 82 y.o. female.   Chief Complaint: Patient is here for EGD. HPI: Patient is an 82 year old Caucasian female with multiple medical problems including coronary artery disease who has been on Plavix chronically.  She has had iron deficiency anemia for past few years.  She had EGD and colonoscopy in 2013 by Dr. Teena Dunk and no definite etiology found.  In January she received 2 units of PRBCs when her hemoglobin dropped below 6 falling few days of melena.  She had melena again few days ago.  Hemoglobin dropped to 8.9.  She says 2 months ago it was back up to 13.  Plavix is on hold.  She she has been on meloxicam until recently..  She denies abdominal pain or rectal bleeding. She is on pantoprazole for GERD.  She has never been diagnosed with peptic ulcer disease although she had thickened antral folds on EGD of 2013.  Past Medical History:  Diagnosis Date  . Aortic stenosis, mild 08/2011; 08/2013   a) 2D Echo Sept 2012 : Borderline LVH, EF greater than 55%. Mild to moderate TR, mild MR, very mild AS w/o AI;;b)  Echo 08/2013 Normal LV size and function. EF 65-70%. Grd 2 DD, mild AS, mild MR  . CAD S/P percutaneous coronary angioplasty 1990s, 11/28/2011    midRCA PCI 1999 (NIR BMS 3.0 mm x 32 mm); distal  RCA BMS Sept 2012 (Vision BMS 2.5 mm x 15 mm); 12/02: RCA ISR --> Cutting PTCA   . Coronary stent restenosis due to progression of disease - Cutting Balloon PTCA to RCA stent 11/30/2011   Cutting Balloon PTCA  . Diabetes mellitus type 2, controlled, without complications (HCC)    no longer taking diabetic medication - diet controlled  . Dyslipidemia, goal LDL below 70 11/28/2011  . Essential hypertension    On ACE inhibitor and ARB? As well as atenolol and hydralazine  . GERD (gastroesophageal reflux disease)   . H/O echocardiogram 08/2011   EF ->55%  . History of blood transfusion    without reaction  . History of nuclear stress test 2008   low risk scan  . History of: Acute  coronary syndrome 11/28/2011   Dyspnea and Shoulder pain prior to adm   . Myocardial infarction [facility]) 1990s   Had PTCA then PCI on RCA    Past Surgical History:  Procedure Laterality Date  . ABDOMINAL HYSTERECTOMY    . BREAST BIOPSY    . CARDIAC CATHETERIZATION  2012   performed for angina  . CARDIAC CATHETERIZATION    . CORONARY ANGIOPLASTY  11/2006   Cutting Balloon of stent in the RCA  . LEFT HEART CATHETERIZATION WITH CORONARY ANGIOGRAM N/A 11/27/2011   Procedure: LEFT HEART CATHETERIZATION WITH CORONARY ANGIOGRAM;  Surgeon: Marykay Lex, MD;  Location: [facility] CATH LAB;  Service: Cardiovascular;  Laterality: N/A;  . PERCUTANEOUS CORONARY STENT INTERVENTION (PCI-S)  1999   Mid RCA - NIR DMS 3.0 mm x 30 mm, dRCA Vision BMS 2.5 mm x 15 mm   . TRANSTHORACIC ECHOCARDIOGRAM  September 2014   Normal LV size and function. EF 65-70%. Grd 2 DD, mild AS, mild MR  . TRANSTHORACIC ECHOCARDIOGRAM  11/2016   2-D Echo 12/12/2016: vigorous LV function with EF 65-70%. GR 2 DD. No RWMA. Mild to mod AS (peak gradient 35 mmHg). Mod LA dilation. Mild MR, Mod TR. PA pressures ~35-40 mmHg.  . TUBAL LIGATION      Family History  Problem Relation Age of Onset  .  Cancer Mother   . Heart disease Father   . Heart disease Maternal Grandmother   . Heart disease Maternal Grandfather   . Heart disease Brother   . Nephrolithiasis Brother        accidental death  . Heart Problems Brother        CABG   Social History:  reports that she has quit smoking. She has never used smokeless tobacco. She reports that she does not drink alcohol or use drugs.  Allergies:  Allergies  Allergen Reactions  . Valium Nausea And Vomiting    Medications Prior to Admission  Medication Sig Dispense Refill  . atorvastatin (LIPITOR) 20 MG tablet Take 20 mg by mouth at bedtime.     . benazepril (LOTENSIN) 20 MG tablet Take 20 mg by mouth daily.     . carvedilol (COREG) 12.5 MG tablet Take 12.5 mg by mouth 2 (two) times daily  with a meal.    . furosemide (LASIX) 40 MG tablet Take 40 mg by mouth daily as needed for fluid.     . hydrALAZINE (APRESOLINE) 100 MG tablet TAKE ONE TABLET BY MOUTH TWICE DAILY 180 tablet 1  . Iron-Vitamin C (VITRON-C) 65-125 MG TABS Take 1 tablet by mouth every other day.     . pantoprazole (PROTONIX) 40 MG tablet Take 1 tablet (40 mg total) by mouth daily. 90 tablet 3  . traMADol (ULTRAM) 50 MG tablet Take 1 tablet by mouth every 6 (six) hours as needed for moderate pain.   1  . vitamin B-12 (CYANOCOBALAMIN) 1000 MCG tablet Take 1,000 mcg by mouth daily.    . clopidogrel (PLAVIX) 75 MG tablet Take 1 tablet (75 mg total) by mouth daily. 90 tablet 1  . meloxicam (MOBIC) 15 MG tablet Take 15 mg by mouth daily.     Marland Kitchen. NITROSTAT 0.4 MG SL tablet DISSOLVE 1 TAB UNDER TOUNGE FOR CHEST PAIN. MAY REPEAT EVERY 5 MINUTES FOR 3 DOSES. IF NO RELIEF CALL 911 OR GO TO ER 25 tablet 3    No results found for this or any previous visit (from the past 48 hour(s)). No results found.  ROS  Blood pressure (!) 211/102, pulse 100, temperature 99 F (37.2 C), temperature source Oral, resp. rate 16, height 5\' 6"  (1.676 m), weight 142 lb (64.4 kg), SpO2 100 %. Physical Exam  Constitutional: She appears well-developed and well-nourished.  HENT:  Mouth/Throat: Oropharynx is clear and moist.  Eyes: Conjunctivae are normal. No scleral icterus.  Neck: No thyromegaly present.  Cardiovascular: Normal rate and regular rhythm.  Murmur heard. Grade 2/6 systolic ejection murmur best set at aortic area.  Respiratory: Effort normal and breath sounds normal.  GI: Soft. She exhibits no distension and no mass. There is no tenderness.  Musculoskeletal: She exhibits no edema.  Neurological: She is alert.  Skin: Skin is warm and dry.     Assessment/Plan Melena and anemia. Diagnostic EGD.  Lionel DecemberNajeeb Rehman, MD 06/03/2018, 7:36 AM

## 2018-06-10 ENCOUNTER — Encounter (HOSPITAL_COMMUNITY): Payer: Self-pay | Admitting: Internal Medicine

## 2018-07-07 ENCOUNTER — Other Ambulatory Visit: Payer: Self-pay | Admitting: Cardiology

## 2018-07-13 DIAGNOSIS — E7800 Pure hypercholesterolemia, unspecified: Secondary | ICD-10-CM | POA: Diagnosis not present

## 2018-07-13 DIAGNOSIS — E782 Mixed hyperlipidemia: Secondary | ICD-10-CM | POA: Diagnosis not present

## 2018-07-13 DIAGNOSIS — I10 Essential (primary) hypertension: Secondary | ICD-10-CM | POA: Diagnosis not present

## 2018-07-13 DIAGNOSIS — R5382 Chronic fatigue, unspecified: Secondary | ICD-10-CM | POA: Diagnosis not present

## 2018-07-13 DIAGNOSIS — E1165 Type 2 diabetes mellitus with hyperglycemia: Secondary | ICD-10-CM | POA: Diagnosis not present

## 2018-07-16 DIAGNOSIS — D649 Anemia, unspecified: Secondary | ICD-10-CM | POA: Diagnosis not present

## 2018-07-16 DIAGNOSIS — E119 Type 2 diabetes mellitus without complications: Secondary | ICD-10-CM | POA: Diagnosis not present

## 2018-07-16 DIAGNOSIS — I10 Essential (primary) hypertension: Secondary | ICD-10-CM | POA: Diagnosis not present

## 2018-07-16 DIAGNOSIS — E782 Mixed hyperlipidemia: Secondary | ICD-10-CM | POA: Diagnosis not present

## 2018-07-16 DIAGNOSIS — R5383 Other fatigue: Secondary | ICD-10-CM | POA: Diagnosis not present

## 2018-07-29 ENCOUNTER — Ambulatory Visit (INDEPENDENT_AMBULATORY_CARE_PROVIDER_SITE_OTHER): Payer: Medicare Other | Admitting: Otolaryngology

## 2018-07-29 DIAGNOSIS — H903 Sensorineural hearing loss, bilateral: Secondary | ICD-10-CM

## 2018-07-29 DIAGNOSIS — H6983 Other specified disorders of Eustachian tube, bilateral: Secondary | ICD-10-CM | POA: Diagnosis not present

## 2018-08-27 ENCOUNTER — Encounter (INDEPENDENT_AMBULATORY_CARE_PROVIDER_SITE_OTHER): Payer: Self-pay | Admitting: *Deleted

## 2018-09-11 DIAGNOSIS — Z23 Encounter for immunization: Secondary | ICD-10-CM | POA: Diagnosis not present

## 2018-10-01 ENCOUNTER — Encounter (INDEPENDENT_AMBULATORY_CARE_PROVIDER_SITE_OTHER): Payer: Self-pay | Admitting: *Deleted

## 2018-10-01 ENCOUNTER — Other Ambulatory Visit (INDEPENDENT_AMBULATORY_CARE_PROVIDER_SITE_OTHER): Payer: Self-pay | Admitting: *Deleted

## 2018-10-01 DIAGNOSIS — K259 Gastric ulcer, unspecified as acute or chronic, without hemorrhage or perforation: Secondary | ICD-10-CM

## 2018-10-05 NOTE — Progress Notes (Signed)
Patient aware.

## 2018-10-19 DIAGNOSIS — J40 Bronchitis, not specified as acute or chronic: Secondary | ICD-10-CM | POA: Diagnosis not present

## 2018-10-22 HISTORY — PX: TRANSTHORACIC ECHOCARDIOGRAM: SHX275

## 2018-10-26 ENCOUNTER — Telehealth: Payer: Self-pay | Admitting: Cardiology

## 2018-10-26 ENCOUNTER — Encounter (INDEPENDENT_AMBULATORY_CARE_PROVIDER_SITE_OTHER): Payer: Self-pay | Admitting: *Deleted

## 2018-10-26 NOTE — Telephone Encounter (Signed)
This encounter was created in error - please disregard.

## 2018-10-26 NOTE — Telephone Encounter (Signed)
New message:          [facility] Pre-operative Risk Assessment    Request for surgical clearance:  1. What type of surgery is being performed? Colonoscopy  2. When is this surgery scheduled? 12/02/18  3. What type of clearance is required (medical clearance vs. Pharmacy clearance to hold med vs. Both)? Pharmacy  4. Are there any medications that need to be held prior to surgery and how long? Plavix: Hold 5 days prior to procedure  5. Practice name and name of physician performing surgery? [facility]  6. What is your office phone number [phone]    7.   What is your office fax number [phone]  8.   Anesthesia type (None, local, MAC, general) ? General   [name] 10/26/2018, 8:49 AM  _________________________________________________________________   (provider comments below)

## 2018-10-26 NOTE — Telephone Encounter (Deleted)
Patient needs suprep 

## 2018-10-28 NOTE — Telephone Encounter (Addendum)
   Primary Cardiologist:David Herbie Baltimore, MD  Chart reviewed as part of pre-operative protocol coverage. Because of [name]'s past medical history and time since last visit, she will require a follow-up visit in order to better assess preoperative cardiovascular risk. Lat OV was just at 1 year ago and due for 12 month follow-up. She actually has an appointment for an echo 11/19 and follow-up with Dr. Herbie Baltimore on 11/17/18 at which time this clearance can be addressed and Dr. Herbie Baltimore can review clearance for holding Plavix. Will add pre-op clearance note to appt. Per office protocol, the provider should assess clearance at time of office visit and should forward their finalized clearance decision to requesting party below. I will remove this message from the pre-op box.  I will route this message to requesting party so they are aware.  Will send as FYI to Dr. Herbie Baltimore to review when he sees patient in clinic (colonoscopy is not scheduled until 12/02/18) - nothing acutely for you to do today, ust be aware when you see patient in clinic as above 11/27.  Laurann Montana, PA-C  10/28/2018, 3:06 PM

## 2018-11-05 ENCOUNTER — Telehealth (INDEPENDENT_AMBULATORY_CARE_PROVIDER_SITE_OTHER): Payer: Self-pay | Admitting: *Deleted

## 2018-11-05 NOTE — Telephone Encounter (Signed)
Referring MD/PCP: sasser   Procedure: egd  Reason/Indication:  gastric ulcer  Has patient had this procedure before?  Yes, 2019  If so, when, by whom and where?    Is there a family history of colon cancer?    Who?  What age when diagnosed?    Is patient diabetic?   no      Does patient have prosthetic heart valve or mechanical valve?  no  Do you have a pacemaker?  no  Has patient ever had endocarditis? no  Has patient had joint replacement within last 12 months?  no  Is patient constipated or do they take laxatives? no  Does patient have a history of alcohol/drug use?  no  Is patient on blood thinner such as Coumadin, Plavix and/or Aspirin? yes  Medications: see epic  Allergies: see epic  Medication Adjustment per Dr Keane Policeehman/Terri Setzer, NP: plavix 5 days before  Procedure date & time: 12/02/18 at 255

## 2018-11-08 NOTE — Telephone Encounter (Signed)
agree

## 2018-11-09 ENCOUNTER — Other Ambulatory Visit: Payer: Self-pay

## 2018-11-09 ENCOUNTER — Ambulatory Visit (HOSPITAL_COMMUNITY): Payer: Medicare Other | Attending: Cardiology

## 2018-11-09 DIAGNOSIS — I2510 Atherosclerotic heart disease of native coronary artery without angina pectoris: Secondary | ICD-10-CM | POA: Diagnosis not present

## 2018-11-09 DIAGNOSIS — I5032 Chronic diastolic (congestive) heart failure: Secondary | ICD-10-CM | POA: Insufficient documentation

## 2018-11-09 DIAGNOSIS — I350 Nonrheumatic aortic (valve) stenosis: Secondary | ICD-10-CM | POA: Diagnosis not present

## 2018-11-09 DIAGNOSIS — I110 Hypertensive heart disease with heart failure: Secondary | ICD-10-CM

## 2018-11-09 DIAGNOSIS — Z9861 Coronary angioplasty status: Secondary | ICD-10-CM | POA: Diagnosis not present

## 2018-11-15 ENCOUNTER — Other Ambulatory Visit: Payer: Self-pay | Admitting: Cardiology

## 2018-11-17 ENCOUNTER — Ambulatory Visit: Payer: Medicare Other | Admitting: Cardiology

## 2018-11-17 ENCOUNTER — Encounter: Payer: Self-pay | Admitting: Cardiology

## 2018-11-17 VITALS — BP 138/60 | HR 78 | Ht 66.0 in | Wt 140.4 lb

## 2018-11-17 DIAGNOSIS — I5032 Chronic diastolic (congestive) heart failure: Secondary | ICD-10-CM | POA: Diagnosis not present

## 2018-11-17 DIAGNOSIS — I350 Nonrheumatic aortic (valve) stenosis: Secondary | ICD-10-CM

## 2018-11-17 DIAGNOSIS — E785 Hyperlipidemia, unspecified: Secondary | ICD-10-CM | POA: Diagnosis not present

## 2018-11-17 DIAGNOSIS — I110 Hypertensive heart disease with heart failure: Secondary | ICD-10-CM | POA: Diagnosis not present

## 2018-11-17 DIAGNOSIS — Z9861 Coronary angioplasty status: Secondary | ICD-10-CM | POA: Diagnosis not present

## 2018-11-17 DIAGNOSIS — K259 Gastric ulcer, unspecified as acute or chronic, without hemorrhage or perforation: Secondary | ICD-10-CM

## 2018-11-17 DIAGNOSIS — I2510 Atherosclerotic heart disease of native coronary artery without angina pectoris: Secondary | ICD-10-CM | POA: Diagnosis not present

## 2018-11-17 DIAGNOSIS — H2513 Age-related nuclear cataract, bilateral: Secondary | ICD-10-CM | POA: Diagnosis not present

## 2018-11-17 DIAGNOSIS — R600 Localized edema: Secondary | ICD-10-CM | POA: Diagnosis not present

## 2018-11-17 DIAGNOSIS — H40033 Anatomical narrow angle, bilateral: Secondary | ICD-10-CM | POA: Diagnosis not present

## 2018-11-17 DIAGNOSIS — Z955 Presence of coronary angioplasty implant and graft: Secondary | ICD-10-CM

## 2018-11-17 NOTE — Progress Notes (Signed)
PCP: Estanislado Pandy, MD  Clinic Note: Chief Complaint  Patient presents with  . Follow-up    Annual.  Relatively stable.  . Coronary Artery Disease    Question stopping Plavix for EGD/colonoscopy  . Pre-op Exam    EGD/colon    HPI: [name] is a 82 y.o. female with a PMH notable for non-STEMI with CAD-PCI) w who presents today for six-month follow-up for CAD-PCI (in the setting of inferior non-STEMI) hypertensive heart disease.  She was seen by Phillips Hay, RPH-CCP in the [facility] in Sept 2018 prior to her last f/u with me.   - she noted that he PCP had d/c'd Valsartan & Amlodipine. Was converted off of amlodipine in order to avoid edema - since then she only had to use furosemide a couple times a month.  Blood pressure 147 was higher.  We thought was to potentially add chlorthalidone.  [name] was last seen on on October 29, 2017.  She was doing pretty well at that time.  Barely using furosemide.  Indicating her blood pressure looks better at home than it does when she comes in here.  No chest pain or pressure.  Occasional skipped beats.  No change in medications.  Follow-up echocardiogram ordered prior to this visit  Recent Hospitalizations:   June 03, 2018: EGD for melena.  Noted to have gastritis and possible PUD.  Has follow-up EGD pending.  Studies Personally Reviewed - (if available, images/films reviewed: From Epic Chart or Care Everywhere)  Echo 11/09/18: EF 60-65%. Gr 2 DD.  No RWMA.  Mild-Mod AS (mean gradient 19 mmHg). Mild-Mod PAH.  PENDING EGD -- would be OK to simply stop Plavix if evidence of PUD/bleeding gastritis.  Interval History: [name] returns here today overall doing fairly well from a cardiac standpoint.  She states that her systolic blood pressures are usually in the 110 to 120 mmHg range -indicating that the pressure today is quite high.  She does seem a little bit more anxious than usual, because  she is asking about preop evaluation for EGD.  She is been having some possible GI bleed issues and is pending EGD in December.  Apparently she did have evaluation back in August showing some gastritis.  She denies any resting or exertional chest tightness or pressure.  She is limited a lot by back pain more so than anything else.  This means that she cannot walk very far or up hills.  She does not take stairs.  She really has not noticed any dyspnea with exertion, just pain.  No PND, orthopnea with stable edema.  Still uses only rare doses of Lasix.  At the most taking it every other day if not even less.  Rare "skipped beats off and on", but nothing overly systematic.  No rapid irregular heartbeats or palpitations.  No syncope/near syncope or TIA/amaurosis fugax.  No claudication.   ROS: A comprehensive was performed. Review of Systems  Constitutional: Negative for malaise/fatigue.  HENT: Negative for congestion and nosebleeds.   Respiratory: Negative for cough, shortness of breath and wheezing.   Cardiovascular:       Per HPI  Gastrointestinal: Positive for melena (None recently, but had does have a EGD scheduled.). Negative for blood in stool.  Genitourinary: Negative for hematuria.  Musculoskeletal: Positive for back pain and joint pain. Negative for falls.  Neurological: Negative for dizziness.  Psychiatric/Behavioral: Negative for depression and memory loss. The patient is not nervous/anxious and does  not have insomnia.   All other systems reviewed and are negative.  I have reviewed and (if needed) personally updated the patient's problem list, medications, allergies, past medical and surgical history, social and family history.   Past Medical History:  Diagnosis Date  . Aortic stenosis, mild 08/2011; 08/2013   a) 2D Echo Sept 2012 : Borderline LVH, EF greater than 55%. Mild to moderate TR, mild MR, very mild AS w/o AI;;b)  Echo 08/2013 Normal LV size and function. EF 65-70%. Grd 2 DD,  mild AS, mild MR  . CAD S/P percutaneous coronary angioplasty 1990s, 11/28/2011    midRCA PCI 1999 (NIR BMS 3.0 mm x 32 mm); distal  RCA BMS Sept 2012 (Vision BMS 2.5 mm x 15 mm); 12/02: RCA ISR --> Cutting PTCA   . Coronary stent restenosis due to progression of disease - Cutting Balloon PTCA to RCA stent 11/30/2011   Cutting Balloon PTCA  . Diabetes mellitus type 2, controlled, without complications (HCC)    no longer taking diabetic medication - diet controlled  . Dyslipidemia, goal LDL below 70 11/28/2011  . Essential hypertension    On ACE inhibitor and ARB? As well as atenolol and hydralazine  . GERD (gastroesophageal reflux disease)   . H/O echocardiogram 08/2011   EF ->55%  . History of blood transfusion    without reaction  . History of nuclear stress test 2008   low risk scan  . History of: Acute coronary syndrome 11/28/2011   Dyspnea and Shoulder pain prior to adm   . Myocardial infarction [facility]) 1990s   Had PTCA then PCI on RCA    Past Surgical History:  Procedure Laterality Date  . ABDOMINAL HYSTERECTOMY    . BIOPSY  06/03/2018   Procedure: BIOPSY;  Surgeon: Malissa Hippo, MD;  Location: [facility];  Service: Endoscopy;;  gastric   . BREAST BIOPSY    . CARDIAC CATHETERIZATION  2012   performed for angina  . CARDIAC CATHETERIZATION    . CORONARY ANGIOPLASTY  11/2006   Cutting Balloon of stent in the RCA  . ESOPHAGOGASTRODUODENOSCOPY N/A 06/03/2018   Procedure: ESOPHAGOGASTRODUODENOSCOPY (EGD);  Surgeon: Malissa Hippo, MD;  Location: [facility];  Service: Endoscopy;  Laterality: N/A;  1:40  . LEFT HEART CATHETERIZATION WITH CORONARY ANGIOGRAM N/A 11/27/2011   Procedure: LEFT HEART CATHETERIZATION WITH CORONARY ANGIOGRAM;  Surgeon: Marykay Lex, MD;  Location: [facility] CATH LAB;  Service: Cardiovascular;  Laterality: N/A;  . PERCUTANEOUS CORONARY STENT INTERVENTION (PCI-S)  1999   Mid RCA - NIR DMS 3.0 mm x 30 mm, dRCA Vision BMS 2.5 mm x 15 mm   . TRANSTHORACIC  ECHOCARDIOGRAM  10/2018   EF 60-65%. Gr 2 DD.  No RWMA.  Mild-Mod AS  (mean gradient 19 mmHg). Mild-Mod PAH.  Marland Kitchen TRANSTHORACIC ECHOCARDIOGRAM  11/2016   2-D Echo 12/12/2016: vigorous LV function with EF 65-70%. GR 2 DD. No RWMA. Mild to mod AS (peak gradient 35 mmHg). Mod LA dilation. Mild MR, Mod TR. PA pressures ~35-40 mmHg.  . TUBAL LIGATION       Current Meds  Medication Sig  . atorvastatin (LIPITOR) 20 MG tablet Take 20 mg by mouth at bedtime.   . benazepril (LOTENSIN) 20 MG tablet Take 20 mg by mouth daily.   . carvedilol (COREG) 12.5 MG tablet TAKE ONE TABLET BY MOUTH TWICE DAILY  . clopidogrel (PLAVIX) 75 MG tablet Take 1 tablet (75 mg total) by mouth daily.  . furosemide (LASIX) 40  MG tablet Take 40 mg by mouth daily as needed for fluid.   . hydrALAZINE (APRESOLINE) 100 MG tablet TAKE ONE TABLET BY MOUTH TWICE DAILY  . Iron-Vitamin C (VITRON-C) 65-125 MG TABS Take 1 tablet by mouth every other day.   Marland Kitchen NITROSTAT 0.4 MG SL tablet DISSOLVE 1 TAB UNDER TOUNGE FOR CHEST PAIN. MAY REPEAT EVERY 5 MINUTES FOR 3 DOSES. IF NO RELIEF CALL 911 OR GO TO ER  . pantoprazole (PROTONIX) 40 MG tablet Take 1 tablet (40 mg total) by mouth 2 (two) times daily before a meal.  . traMADol (ULTRAM) 50 MG tablet Take 1 tablet by mouth every 6 (six) hours as needed for moderate pain.   . vitamin B-12 (CYANOCOBALAMIN) 1000 MCG tablet Take 1,000 mcg by mouth daily.    Allergies  Allergen Reactions  . Valium Nausea And Vomiting   Social History   Tobacco Use  . Smoking status: Former Games developer  . Smokeless tobacco: Never Used  . Tobacco comment: Quit approximately 1993  Substance Use Topics  . Alcohol use: No  . Drug use: No   Social History   Social History Narrative   She is a married mother of 3, grandmother to. 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 Quit smoking in '99, denies alcohol.   She volunteers for [employer].   Family History family history includes Cancer in her mother;  Heart Problems in her brother; Heart disease in her brother, father, maternal grandfather, and maternal grandmother; Nephrolithiasis in her brother.  Wt Readings from Last 3 Encounters:  11/17/18 140 lb 6.4 oz (63.7 kg)  06/03/18 142 lb (64.4 kg)  06/01/18 145 lb 1.6 oz (65.8 kg)    PHYSICAL EXAM BP 138/60   Pulse 78   Ht 5\' 6"  (1.676 m)   Wt 140 lb 6.4 oz (63.7 kg)   BMI 22.66 kg/m  Physical Exam  Constitutional: She is oriented to person, place, and time. She appears well-developed and well-nourished. No distress.  Relatively healthy appearing  HENT:  Head: Normocephalic and atraumatic.  Neck: Normal range of motion. Neck supple. Normal carotid pulses, no hepatojugular reflux and no JVD present. Carotid bruit is not present.  Cardiovascular: Normal rate, regular rhythm, S1 normal, S2 normal and intact distal pulses.  Occasional extrasystoles are present. PMI is not displaced. Exam reveals no gallop and no friction rub.  Murmur heard. High-pitched harsh crescendo-decrescendo midsystolic murmur is present with a grade of 3/6 at the upper right sternal border radiating to the neck.  Holosystolic murmur of grade 2/6 is also present at the lower left sternal border radiating to the apex. Pulmonary/Chest: Effort normal and breath sounds normal. No respiratory distress. She has no wheezes. She has no rales.  Abdominal: Soft. Bowel sounds are normal. She exhibits no distension. There is no tenderness. There is no rebound.  Musculoskeletal: Normal range of motion. She exhibits no edema or deformity.  Neurological: She is alert and oriented to person, place, and time.  Psychiatric: She has a normal mood and affect. Her behavior is normal. Judgment and thought content normal.  Nursing note and vitals reviewed.    Adult ECG Report Sinus rhythm, rate 78.  Sinus arrhythmia.  PVCs.  Otherwise normal axis, intervals durations.  Other studies Reviewed: Additional studies/ records that were  reviewed today include:  Recent Labs:  Followed by PCP.  Per K PN July 13, 2018: TC 134, TG 118, LDL 63, HDL 47.  Creatinine 0.73.  (A1c 5.5  from 03/10/2017).  ASSESSMENT / PLAN: Problem List Items Addressed This Visit    Aortic stenosis, moderate (Chronic)    Pretty stable echo without much change since 2017.  At this point, I think we can probably may be checked again in 2021.  We can discuss a follow-up visit.      CAD S/P percutaneous coronary angioplasty -- PCI with BMS to RCA followed by POBA of ISR - Primary (Chronic)    Doing fairly well without any active anginal symptoms.    Remains on Plavix alone without aspirin.  She is far enough out from any PCI that this could be held for procedures.  Remains on stable dose of beta-blocker and statin.  She is also on ACE inhibitor.  Had previously been on calcium channel blocker but this was discontinued due to edema.      Relevant Orders   EKG 12-Lead   Dyslipidemia, goal LDL below 70 (Chronic)    Last set of lipids look pretty well controlled on current dose of statin.  Absolute goal for LDL be less than 50, but this is less than 70 and would therefore not further titrate up atorvastatin in order to avoid any such adverse side effects.      Gastric ulcer    Has EGD pending.  Okay to hold Plavix.  In fact would be okay to formally stop Plavix if necessary based on findings.      Hypertensive heart disease with chronic diastolic congestive heart failure (HCC) (Chronic)    Moderate hypertension on several medications.  She says at home her blood pressure looks much better than it is today.  I am fearful of potential hypotension and dizziness.  We will therefore continue 12 and half twice daily carvedilol and 20 of benazepril along with twice daily hydralazine 100 mg. We did convert from amlodipine to hydralazine, and pressures seem to be stable at home.  Monitor closely.      Relevant Orders   EKG 12-Lead   Lower extremity edema  (Chronic)    Well-controlled.  Continue to recommend support stockings.  Also feet elevation.  Is on stable dose of Lasix which we can change to 20 mg every other day as directed.      Presence of stent in right coronary artery, HSRA for ISR in 2012 (Chronic)    Remains on Plavix without aspirin -mostly because of secondary prevention..  But okay to hold Plavix for procedures. -Okay to hold Plavix 5 to 7 days preprocedure and up to 2 days post procedure if necessary.  If there is recurrent significant bleeding, will defer to GI, but it would be okay to even fully stop Plavix for more long-term basis if necessary.         Current medicines are reviewed at length with the patient today. (+/- concerns) n/a The following changes have been made: n/a  Patient Instructions  Medication Instructions:   OKAY  TO STOP PLAVIX IF  GI NEEDS TO AFTER  EGD/COLONSCOPY. If you need a refill on your cardiac medications before your next appointment, please call your pharmacy.   Lab work: NOT  NEEDED If you have labs (blood work) drawn today and your tests are completely normal, you will receive your results only by: Marland Kitchen MyChart Message (if you have MyChart) OR . A paper copy in the mail If you have any lab test that is abnormal or we need to change your treatment, we will call you to review the results.  Testing/Procedures: NOT NEEDED  Follow-Up: At [facility], you and your health needs are our priority.  As part of our continuing mission to provide you with exceptional heart care, we have created designated Provider Care Teams.  These Care Teams include your primary Cardiologist (physician) and Advanced Practice Providers (APPs -  Physician Assistants and Nurse Practitioners) who all work together to provide you with the care you need, when you need it. You will need a follow up appointment in 12 months.  Please call our office 2 months in advance to schedule this appointment.  You may see Bryan Lemmaavid  Alonza Knisley, MD or one of the following Advanced Practice Providers on your designated Care Team:   Theodore DemarkRhonda Barrett, PA-C . Joni ReiningKathryn Lawrence, DNP, ANP  Any Other Special Instructions Will Be Listed Below (If Applicable).  CALL IN THE MIDDLE OF July FOR APPOINTMENT FOR OV 2020    Studies Ordered:   Orders Placed This Encounter  Procedures  . EKG 12-Lead      Bryan Lemmaavid Crystalynn Mcinerney, M.D., M.S. Interventional Cardiologist   Pager # [phone] Phone # [phone] [address] [place], [place] [id number]

## 2018-11-17 NOTE — Patient Instructions (Signed)
Medication Instructions:   OKAY  TO STOP PLAVIX IF  GI NEEDS TO AFTER  EGD/COLONSCOPY. If you need a refill on your cardiac medications before your next appointment, please call your pharmacy.   Lab work: NOT  NEEDED If you have labs (blood work) drawn today and your tests are completely normal, you will receive your results only by: Marland Kitchen. MyChart Message (if you have MyChart) OR . A paper copy in the mail If you have any lab test that is abnormal or we need to change your treatment, we will call you to review the results.  Testing/Procedures: NOT NEEDED  Follow-Up: At [facility], you and your health needs are our priority.  As part of our continuing mission to provide you with exceptional heart care, we have created designated Provider Care Teams.  These Care Teams include your primary Cardiologist (physician) and Advanced Practice Providers (APPs -  Physician Assistants and Nurse Practitioners) who all work together to provide you with the care you need, when you need it. You will need a follow up appointment in 12 months.  Please call our office 2 months in advance to schedule this appointment.  You may see Bryan Lemmaavid Harding, MD or one of the following Advanced Practice Providers on your designated Care Team:   Theodore DemarkRhonda Barrett, PA-C . Joni ReiningKathryn Lawrence, DNP, ANP  Any Other Special Instructions Will Be Listed Below (If Applicable).  CALL IN THE MIDDLE OF July FOR APPOINTMENT FOR OV 2020

## 2018-11-19 ENCOUNTER — Encounter: Payer: Self-pay | Admitting: Cardiology

## 2018-11-19 DIAGNOSIS — E1165 Type 2 diabetes mellitus with hyperglycemia: Secondary | ICD-10-CM | POA: Diagnosis not present

## 2018-11-19 DIAGNOSIS — R5382 Chronic fatigue, unspecified: Secondary | ICD-10-CM | POA: Diagnosis not present

## 2018-11-19 DIAGNOSIS — Z7689 Persons encountering health services in other specified circumstances: Secondary | ICD-10-CM | POA: Diagnosis not present

## 2018-11-19 DIAGNOSIS — E782 Mixed hyperlipidemia: Secondary | ICD-10-CM | POA: Diagnosis not present

## 2018-11-19 DIAGNOSIS — I10 Essential (primary) hypertension: Secondary | ICD-10-CM | POA: Diagnosis not present

## 2018-11-19 DIAGNOSIS — E7800 Pure hypercholesterolemia, unspecified: Secondary | ICD-10-CM | POA: Diagnosis not present

## 2018-11-19 NOTE — Assessment & Plan Note (Signed)
Last set of lipids look pretty well controlled on current dose of statin.  Absolute goal for LDL be less than 50, but this is less than 70 and would therefore not further titrate up atorvastatin in order to avoid any such adverse side effects.

## 2018-11-19 NOTE — Assessment & Plan Note (Signed)
Has EGD pending.  Okay to hold Plavix.  In fact would be okay to formally stop Plavix if necessary based on findings.

## 2018-11-19 NOTE — Assessment & Plan Note (Signed)
Pretty stable echo without much change since 2017.  At this point, I think we can probably may be checked again in 2021.  We can discuss a follow-up visit.

## 2018-11-19 NOTE — Assessment & Plan Note (Addendum)
Remains on Plavix without aspirin -mostly because of secondary prevention..  But okay to hold Plavix for procedures. -Okay to hold Plavix 5 to 7 days preprocedure and up to 2 days post procedure if necessary.  If there is recurrent significant bleeding, will defer to GI, but it would be okay to even fully stop Plavix for more long-term basis if necessary.

## 2018-11-19 NOTE — Assessment & Plan Note (Signed)
Moderate hypertension on several medications.  She says at home her blood pressure looks much better than it is today.  I am fearful of potential hypotension and dizziness.  We will therefore continue 12 and half twice daily carvedilol and 20 of benazepril along with twice daily hydralazine 100 mg. We did convert from amlodipine to hydralazine, and pressures seem to be stable at home.  Monitor closely.

## 2018-11-19 NOTE — Assessment & Plan Note (Signed)
Well-controlled.  Continue to recommend support stockings.  Also feet elevation.  Is on stable dose of Lasix which we can change to 20 mg every other day as directed.

## 2018-11-19 NOTE — Assessment & Plan Note (Addendum)
Doing fairly well without any active anginal symptoms.    Remains on Plavix alone without aspirin.  She is far enough out from any PCI that this could be held for procedures.  Remains on stable dose of beta-blocker and statin.  She is also on ACE inhibitor.  Had previously been on calcium channel blocker but this was discontinued due to edema.

## 2018-11-21 DIAGNOSIS — K259 Gastric ulcer, unspecified as acute or chronic, without hemorrhage or perforation: Secondary | ICD-10-CM

## 2018-11-21 HISTORY — DX: Gastric ulcer, unspecified as acute or chronic, without hemorrhage or perforation: K25.9

## 2018-11-22 DIAGNOSIS — M19042 Primary osteoarthritis, left hand: Secondary | ICD-10-CM | POA: Diagnosis not present

## 2018-11-22 DIAGNOSIS — M19041 Primary osteoarthritis, right hand: Secondary | ICD-10-CM | POA: Diagnosis not present

## 2018-11-22 DIAGNOSIS — Z0001 Encounter for general adult medical examination with abnormal findings: Secondary | ICD-10-CM | POA: Diagnosis not present

## 2018-11-22 DIAGNOSIS — I10 Essential (primary) hypertension: Secondary | ICD-10-CM | POA: Diagnosis not present

## 2018-12-02 ENCOUNTER — Ambulatory Visit (HOSPITAL_COMMUNITY)
Admission: RE | Admit: 2018-12-02 | Discharge: 2018-12-02 | Disposition: A | Discharge status: Home / Self Care | Payer: Medicare Other | Attending: Internal Medicine | Admitting: Internal Medicine

## 2018-12-02 ENCOUNTER — Encounter (HOSPITAL_COMMUNITY)
Admission: RE | Disposition: A | Discharge status: Home / Self Care | Payer: Self-pay | Source: Home / Self Care | Attending: Internal Medicine

## 2018-12-02 ENCOUNTER — Encounter (HOSPITAL_COMMUNITY): Payer: Self-pay

## 2018-12-02 ENCOUNTER — Other Ambulatory Visit: Payer: Self-pay

## 2018-12-02 DIAGNOSIS — E119 Type 2 diabetes mellitus without complications: Secondary | ICD-10-CM | POA: Diagnosis not present

## 2018-12-02 DIAGNOSIS — Z955 Presence of coronary angioplasty implant and graft: Secondary | ICD-10-CM | POA: Insufficient documentation

## 2018-12-02 DIAGNOSIS — Z8719 Personal history of other diseases of the digestive system: Secondary | ICD-10-CM | POA: Diagnosis not present

## 2018-12-02 DIAGNOSIS — K449 Diaphragmatic hernia without obstruction or gangrene: Secondary | ICD-10-CM | POA: Diagnosis not present

## 2018-12-02 DIAGNOSIS — K228 Other specified diseases of esophagus: Secondary | ICD-10-CM | POA: Diagnosis not present

## 2018-12-02 DIAGNOSIS — Z79899 Other long term (current) drug therapy: Secondary | ICD-10-CM | POA: Insufficient documentation

## 2018-12-02 DIAGNOSIS — I350 Nonrheumatic aortic (valve) stenosis: Secondary | ICD-10-CM | POA: Diagnosis not present

## 2018-12-02 DIAGNOSIS — Z87891 Personal history of nicotine dependence: Secondary | ICD-10-CM | POA: Diagnosis not present

## 2018-12-02 DIAGNOSIS — K259 Gastric ulcer, unspecified as acute or chronic, without hemorrhage or perforation: Secondary | ICD-10-CM

## 2018-12-02 DIAGNOSIS — I252 Old myocardial infarction: Secondary | ICD-10-CM | POA: Insufficient documentation

## 2018-12-02 DIAGNOSIS — Z8249 Family history of ischemic heart disease and other diseases of the circulatory system: Secondary | ICD-10-CM | POA: Diagnosis not present

## 2018-12-02 DIAGNOSIS — K921 Melena: Secondary | ICD-10-CM

## 2018-12-02 DIAGNOSIS — K3189 Other diseases of stomach and duodenum: Secondary | ICD-10-CM | POA: Diagnosis not present

## 2018-12-02 DIAGNOSIS — I10 Essential (primary) hypertension: Secondary | ICD-10-CM | POA: Insufficient documentation

## 2018-12-02 DIAGNOSIS — I2510 Atherosclerotic heart disease of native coronary artery without angina pectoris: Secondary | ICD-10-CM | POA: Diagnosis not present

## 2018-12-02 DIAGNOSIS — Z7902 Long term (current) use of antithrombotics/antiplatelets: Secondary | ICD-10-CM | POA: Diagnosis not present

## 2018-12-02 DIAGNOSIS — K219 Gastro-esophageal reflux disease without esophagitis: Secondary | ICD-10-CM | POA: Diagnosis not present

## 2018-12-02 DIAGNOSIS — E785 Hyperlipidemia, unspecified: Secondary | ICD-10-CM | POA: Diagnosis not present

## 2018-12-02 HISTORY — PX: ESOPHAGOGASTRODUODENOSCOPY: SHX5428

## 2018-12-02 SURGERY — EGD (ESOPHAGOGASTRODUODENOSCOPY)
Anesthesia: Moderate Sedation

## 2018-12-02 MED ORDER — MEPERIDINE HCL 50 MG/ML IJ SOLN
INTRAMUSCULAR | Status: AC
  Filled 2018-12-02: qty 1

## 2018-12-02 MED ORDER — SODIUM CHLORIDE 0.9 % IV SOLN
INTRAVENOUS | Status: DC
  Administered 2018-12-02: 14:00:00 via INTRAVENOUS

## 2018-12-02 MED ORDER — HYDRALAZINE HCL 20 MG/ML IJ SOLN
INTRAMUSCULAR | Status: AC
  Filled 2018-12-02: qty 1

## 2018-12-02 MED ORDER — MEPERIDINE HCL 50 MG/ML IJ SOLN
INTRAMUSCULAR | Status: DC | PRN
  Administered 2018-12-02 (×2): 25 mg via INTRAVENOUS

## 2018-12-02 MED ORDER — ENALAPRILAT 1.25 MG/ML IV SOLN
INTRAVENOUS | Status: AC
  Filled 2018-12-02: qty 2

## 2018-12-02 MED ORDER — ENALAPRILAT 1.25 MG/ML IV SOLN
INTRAVENOUS | Status: DC | PRN
  Administered 2018-12-02: 1.25 mg via INTRAVENOUS

## 2018-12-02 MED ORDER — PANTOPRAZOLE SODIUM 40 MG PO TBEC: 40.0000 mg | DELAYED_RELEASE_TABLET | Freq: Every day | ORAL | 3 refills | Status: DC

## 2018-12-02 MED ORDER — MIDAZOLAM HCL 5 MG/5ML IJ SOLN
INTRAMUSCULAR | Status: DC | PRN
  Administered 2018-12-02 (×2): 2 mg via INTRAVENOUS

## 2018-12-02 MED ORDER — LIDOCAINE VISCOUS HCL 2 % MT SOLN
OROMUCOSAL | Status: DC | PRN
  Administered 2018-12-02: 4 mL via OROMUCOSAL

## 2018-12-02 MED ORDER — MIDAZOLAM HCL 5 MG/5ML IJ SOLN
INTRAMUSCULAR | Status: AC
  Filled 2018-12-02: qty 10

## 2018-12-02 MED ORDER — STERILE WATER FOR IRRIGATION IR SOLN
Status: DC | PRN
  Administered 2018-12-02: 15:00:00

## 2018-12-02 MED ORDER — LIDOCAINE VISCOUS HCL 2 % MT SOLN
OROMUCOSAL | Status: AC
  Filled 2018-12-02: qty 15

## 2018-12-02 NOTE — H&P (Addendum)
[name] is an 82 y.o. female.   Chief Complaint: Patient is here for EGD. HPI: Patient 82 year old Caucasian female who has a history of GI bleed second gastric ulcers.  She underwent EGD back in June 2019.  Patient had been on meloxicam.  Gastric biopsy revealed benign ulcer and H. pylori stains were negative.  She is not taking any NSAIDs.  She is taking Tylenol for back pain.  She denies abdominal pain melena nausea or vomiting. Hemoglobin 2 weeks ago was 11.3 g. Last Plavix dose was 5 days ago.  Past Medical History:  Diagnosis Date  . Aortic stenosis, mild 08/2011; 08/2013   a) 2D Echo Sept 2012 : Borderline LVH, EF greater than 55%. Mild to moderate TR, mild MR, very mild AS w/o AI;;b)  Echo 08/2013 Normal LV size and function. EF 65-70%. Grd 2 DD, mild AS, mild MR  . CAD S/P percutaneous coronary angioplasty 1990s, 11/28/2011    midRCA PCI 1999 (NIR BMS 3.0 mm x 32 mm); distal  RCA BMS Sept 2012 (Vision BMS 2.5 mm x 15 mm); 12/02: RCA ISR --> Cutting PTCA   . Coronary stent restenosis due to progression of disease - Cutting Balloon PTCA to RCA stent 11/30/2011   Cutting Balloon PTCA  . Diabetes mellitus type 2, controlled, without complications (HCC)    no longer taking diabetic medication - diet controlled  . Dyslipidemia, goal LDL below 70 11/28/2011  . Essential hypertension    On ACE inhibitor and ARB? As well as atenolol and hydralazine  . GERD (gastroesophageal reflux disease)   . H/O echocardiogram 08/2011   EF ->55%  . History of blood transfusion    without reaction  . History of nuclear stress test 2008   low risk scan  . History of: Acute coronary syndrome 11/28/2011   Dyspnea and Shoulder pain prior to adm   . Myocardial infarction [facility](HCC) 1990s   Had PTCA then PCI on RCA    Past Surgical History:  Procedure Laterality Date  . ABDOMINAL HYSTERECTOMY    . BIOPSY  06/03/2018   Procedure: BIOPSY;  Surgeon: Malissa Hippoehman, Carolynne Schuchard U, MD;  Location: [facility];  Service:  Endoscopy;;  gastric   . BREAST BIOPSY    . CARDIAC CATHETERIZATION  2012   performed for angina  . CARDIAC CATHETERIZATION    . CORONARY ANGIOPLASTY  11/2006   Cutting Balloon of stent in the RCA  . ESOPHAGOGASTRODUODENOSCOPY N/A 06/03/2018   Procedure: ESOPHAGOGASTRODUODENOSCOPY (EGD);  Surgeon: Malissa Hippoehman, Donnabelle Blanchard U, MD;  Location: [facility];  Service: Endoscopy;  Laterality: N/A;  1:40  . LEFT HEART CATHETERIZATION WITH CORONARY ANGIOGRAM N/A 11/27/2011   Procedure: LEFT HEART CATHETERIZATION WITH CORONARY ANGIOGRAM;  Surgeon: Marykay Lexavid W Harding, MD;  [facility] CATH LAB;  Service: Cardiovascular;  Laterality: N/A;  . PERCUTANEOUS CORONARY STENT INTERVENTION (PCI-S)  1999   Mid RCA - NIR DMS 3.0 mm x 30 mm, dRCA Vision BMS 2.5 mm x 15 mm   . TRANSTHORACIC ECHOCARDIOGRAM  10/2018   EF 60-65%. Gr 2 DD.  No RWMA.  Mild-Mod AS  (mean gradient 19 mmHg). Mild-Mod PAH.  Marland Kitchen. TRANSTHORACIC ECHOCARDIOGRAM  11/2016   2-D Echo 12/12/2016: vigorous LV function with EF 65-70%. GR 2 DD. No RWMA. Mild to mod AS (peak gradient 35 mmHg). Mod LA dilation. Mild MR, Mod TR. PA pressures ~35-40 mmHg.  . TUBAL LIGATION      Family History  Problem Relation Age of Onset  . Cancer Mother   .  Heart disease Father   . Heart disease Maternal Grandmother   . Heart disease Maternal Grandfather   . Heart disease Brother   . Nephrolithiasis Brother        accidental death  . Heart Problems Brother        CABG   Social History:  reports that she has quit smoking. She has never used smokeless tobacco. She reports that she does not drink alcohol or use drugs.  Allergies:  Allergies  Allergen Reactions  . Valium Nausea And Vomiting    Medications Prior to Admission  Medication Sig Dispense Refill  . acetaminophen (TYLENOL) 500 MG tablet Take 1,000 mg by mouth 2 (two) times daily as needed (pain.).    Marland Kitchen atorvastatin (LIPITOR) 20 MG tablet Take 20 mg by mouth at bedtime.     . benazepril (LOTENSIN) 40 MG tablet  Take 20 mg by mouth 2 (two) times daily.    . carvedilol (COREG) 12.5 MG tablet TAKE ONE TABLET BY MOUTH TWICE DAILY (Patient taking differently: Take 12.5 mg by mouth 2 (two) times daily. ) 60 tablet 0  . furosemide (LASIX) 40 MG tablet Take 40 mg by mouth daily as needed for fluid.     . hydrALAZINE (APRESOLINE) 100 MG tablet TAKE ONE TABLET BY MOUTH TWICE DAILY (Patient taking differently: Take 100 mg by mouth 2 (two) times daily. ) 180 tablet 1  . Iron-Vitamin C (VITRON-C) 65-125 MG TABS Take 1 tablet by mouth every other day.     . Multiple Vitamin (MULTIVITAMIN WITH MINERALS) TABS tablet Take 1 tablet by mouth daily. Women's One A Day Multivitamin    . pantoprazole (PROTONIX) 40 MG tablet Take 1 tablet (40 mg total) by mouth 2 (two) times daily before a meal. 60 tablet 3  . traMADol (ULTRAM) 50 MG tablet Take 50 mg by mouth every 6 (six) hours as needed for moderate pain.   1  . clopidogrel (PLAVIX) 75 MG tablet Take 1 tablet (75 mg total) by mouth daily. 90 tablet 1  . NITROSTAT 0.4 MG SL tablet DISSOLVE 1 TAB UNDER TOUNGE FOR CHEST PAIN. MAY REPEAT EVERY 5 MINUTES FOR 3 DOSES. IF NO RELIEF CALL 911 OR GO TO ER (Patient taking differently: Place 0.4 mg under the tongue every 5 (five) minutes x 3 doses as needed for chest pain. ) 25 tablet 3    No results found for this or any previous visit (from the past 48 hour(s)). No results found.  ROS  Blood pressure (!) 207/60, pulse 98, temperature 98.1 F (36.7 C), temperature source Oral, resp. rate 16, SpO2 100 %. Physical Exam  Constitutional: She appears well-developed and well-nourished.  HENT:  Mouth/Throat: Oropharynx is clear and moist.  Eyes: Conjunctivae are normal. No scleral icterus.  Neck: No thyromegaly present.  Cardiovascular:  Cardiac exam with regular rhythm normal S1 and S2.  He has a grade 2/6 to 3/6 systolic ejection murmur at apex and she has another grade 2/6 systolic ejection murmur at aortic area.  Respiratory:  Effort normal and breath sounds normal.  GI: Soft. She exhibits no distension and no mass. There is no abdominal tenderness.  Musculoskeletal:        General: No edema.  Lymphadenopathy:    She has no cervical adenopathy.  Neurological: She is alert.  Skin: Skin is warm and dry.     Assessment/Plan History of gastric ulcers. Esophagogastroduodenoscopy to document complete healing before medication change.  Lionel December, MD 12/02/2018, 2:46 PM

## 2018-12-02 NOTE — Discharge Instructions (Signed)
Pantoprazole to 40 mg by mouth 30 minutes before breakfast daily. Resume clopidogrel and other medications as before. Resume usual diet. No driving for 24 hours. Notify if you have epigastric pain or tarry stool.   Upper Endoscopy, Care After Refer to this sheet in the next few weeks. These instructions provide you with information about caring for yourself after your procedure. Your health care provider may also give you more specific instructions. Your treatment has been planned according to current medical practices, but problems sometimes occur. Call your health care provider if you have any problems or questions after your procedure. What can I expect after the procedure? After the procedure, it is common to have:  A sore throat.  Bloating.  Nausea.  Follow these instructions at home:  Follow instructions from your health care provider about what to eat or drink after your procedure.  Return to your normal activities as told by your health care provider. Ask your health care provider what activities are safe for you.  Take over-the-counter and prescription medicines only as told by your health care provider.  Do not drive for 24 hours if you received a sedative.  Keep all follow-up visits as told by your health care provider. This is important. Contact a health care provider if:  You have a sore throat that lasts longer than one day.  You have trouble swallowing. Get help right away if:  You have a fever.  You vomit blood or your vomit looks like coffee grounds.  You have bloody, black, or tarry stools.  You have a severe sore throat or you cannot swallow.  You have difficulty breathing.  You have severe pain in your chest or belly. This information is not intended to replace advice given to you by your health care provider. Make sure you discuss any questions you have with your health care provider. Document Released: 06/08/2012 Document Revised: 05/15/2016  Document Reviewed: 09/20/2015 Elsevier Interactive Patient Education  [employer]2018 [employer]

## 2018-12-02 NOTE — Op Note (Signed)
[facility] Patient Name: [name] Procedure Date: 12/02/2018 2:29 PM MRN: [id number] Date of Birth: 1936/12/11 Attending MD: Lionel December , MD CSN: [id number] Age: 82 Admit Type: Outpatient Procedure:                Upper GI endoscopy Indications:              Follow-up of gastric ulcer Providers:                Lionel December, MD, Loma Messing B. Patsy Lager, RN, Dyann Ruddle Referring MD:             Estanislado Pandy, MD Medicines:                Lidocaine spray, Meperidine 50 mg IV, Midazolam 4                            mg IV Complications:            No immediate complications. Estimated Blood Loss:     Estimated blood loss: none. Procedure:                Pre-Anesthesia Assessment:                           - Prior to the procedure, a History and Physical                            was performed, and patient medications and                            allergies were reviewed. The patient's tolerance of                            previous anesthesia was also reviewed. The risks                            and benefits of the procedure and the sedation                            options and risks were discussed with the patient.                            All questions were answered, and informed consent                            was obtained. Prior Anticoagulants: The patient                            last took Plavix (clopidogrel) 5 days prior to the                            procedure. ASA Grade Assessment: III - A patient  with severe systemic disease. After reviewing the                            risks and benefits, the patient was deemed in                            satisfactory condition to undergo the procedure.                           After obtaining informed consent, the endoscope was                            passed under direct vision. Throughout the                            procedure, the patient's blood  pressure, pulse, and                            oxygen saturations were monitored continuously. The                            [id number]-[id number] ([id number]) scope was introduced through the                            mouth, and advanced to the second part of duodenum.                            The upper GI endoscopy was accomplished without                            difficulty. The patient tolerated the procedure                            well. Scope In: 3:00:25 PM Scope Out: 3:06:18 PM Total Procedure Duration: 0 hours 5 minutes 53 seconds  Findings:      The examined esophagus was normal.      The Z-line was irregular and was found 37 cm from the incisors.      A 2 cm hiatal hernia was present.      Patchy mildly erythematous mucosa without bleeding was found in the       gastric antrum.      A healed ulcers were found in the gastric antrum.      The exam of the stomach was otherwise normal.      The duodenal bulb and second portion of the duodenum were normal. Impression:               - Normal esophagus. Healed esophagitis.                           - Z-line irregular, 37 cm from the incisors.                           - 2 cm hiatal hernia.                           -  Erythematous mucosa in the antrum.                           - Two scars in the gastric antrum indicative of                            healed ulcers.                           - Normal duodenal bulb and second portion of the                            duodenum.                           - No specimens collected. Moderate Sedation:      Moderate (conscious) sedation was administered by the endoscopy nurse       and supervised by the endoscopist. The following parameters were       monitored: oxygen saturation, heart rate, blood pressure, CO2       capnography and response to care. Total physician intraservice time was       12 minutes. Recommendation:           - Patient has a contact number available for                             emergencies. The signs and symptoms of potential                            delayed complications were discussed with the                            patient. Return to normal activities tomorrow.                            Written discharge instructions were provided to the                            patient.                           - Resume previous diet today.                           - Decrease Pantoprazole to 40 mg po qam.                           - Continue other medications as before.                           - Resume Plavix (clopidogrel) at prior dose today. Procedure Code(s):        --- Professional ---                           [phone], Esophagogastroduodenoscopy, flexible,  transoral; diagnostic, including collection of                            specimen(s) by brushing or washing, when performed                            (separate procedure)                           G0500, Moderate sedation services provided by the                            same physician or other qualified health care                            professional performing a gastrointestinal                            endoscopic service that sedation supports,                            requiring the presence of an independent trained                            observer to assist in the monitoring of the                            patient's level of consciousness and physiological                            status; initial 15 minutes of intra-service time;                            patient age 64 years or older (additional time may                            be reported with [id number], as appropriate) Diagnosis Code(s):        --- Professional ---                           K22.8, Other specified diseases of esophagus                           K44.9, Diaphragmatic hernia without obstruction or                            gangrene                           K31.89, Other diseases  of stomach and duodenum                           K25.9, Gastric ulcer, unspecified as acute or  chronic, without hemorrhage or perforation CPT copyright 2018 [employer]. All rights reserved. The codes documented in this report are preliminary and upon coder review may  be revised to meet current compliance requirements. Lionel December, MD Lionel December, MD 12/02/2018 3:19:09 PM This report has been signed electronically. Number of Addenda: 0

## 2018-12-08 ENCOUNTER — Encounter (HOSPITAL_COMMUNITY): Payer: Self-pay | Admitting: Internal Medicine

## 2019-02-03 ENCOUNTER — Ambulatory Visit (INDEPENDENT_AMBULATORY_CARE_PROVIDER_SITE_OTHER): Payer: Medicare Other | Admitting: Otolaryngology

## 2019-02-03 DIAGNOSIS — H6121 Impacted cerumen, right ear: Secondary | ICD-10-CM | POA: Diagnosis not present

## 2019-02-03 DIAGNOSIS — H6983 Other specified disorders of Eustachian tube, bilateral: Secondary | ICD-10-CM | POA: Diagnosis not present

## 2019-02-14 ENCOUNTER — Other Ambulatory Visit: Payer: Self-pay | Admitting: Cardiology

## 2019-03-17 ENCOUNTER — Other Ambulatory Visit: Payer: Self-pay | Admitting: Cardiology

## 2019-03-21 DIAGNOSIS — I10 Essential (primary) hypertension: Secondary | ICD-10-CM | POA: Diagnosis not present

## 2019-03-21 DIAGNOSIS — R5382 Chronic fatigue, unspecified: Secondary | ICD-10-CM | POA: Diagnosis not present

## 2019-03-21 DIAGNOSIS — D649 Anemia, unspecified: Secondary | ICD-10-CM | POA: Diagnosis not present

## 2019-03-21 DIAGNOSIS — E782 Mixed hyperlipidemia: Secondary | ICD-10-CM | POA: Diagnosis not present

## 2019-03-21 DIAGNOSIS — E1165 Type 2 diabetes mellitus with hyperglycemia: Secondary | ICD-10-CM | POA: Diagnosis not present

## 2019-03-24 DIAGNOSIS — I10 Essential (primary) hypertension: Secondary | ICD-10-CM | POA: Diagnosis not present

## 2019-03-24 DIAGNOSIS — E782 Mixed hyperlipidemia: Secondary | ICD-10-CM | POA: Diagnosis not present

## 2019-03-24 DIAGNOSIS — R5383 Other fatigue: Secondary | ICD-10-CM | POA: Diagnosis not present

## 2019-03-24 DIAGNOSIS — M545 Low back pain: Secondary | ICD-10-CM | POA: Diagnosis not present

## 2019-03-24 DIAGNOSIS — E119 Type 2 diabetes mellitus without complications: Secondary | ICD-10-CM | POA: Diagnosis not present

## 2019-04-20 ENCOUNTER — Other Ambulatory Visit: Payer: Self-pay | Admitting: Cardiology

## 2019-04-20 NOTE — Telephone Encounter (Signed)
Carvedilol 12.5 mg refilled. 

## 2019-05-05 DIAGNOSIS — M1612 Unilateral primary osteoarthritis, left hip: Secondary | ICD-10-CM | POA: Diagnosis not present

## 2019-06-21 DIAGNOSIS — I10 Essential (primary) hypertension: Secondary | ICD-10-CM | POA: Diagnosis not present

## 2019-06-21 DIAGNOSIS — E119 Type 2 diabetes mellitus without complications: Secondary | ICD-10-CM | POA: Diagnosis not present

## 2019-06-21 DIAGNOSIS — E782 Mixed hyperlipidemia: Secondary | ICD-10-CM | POA: Diagnosis not present

## 2019-07-22 DIAGNOSIS — E1165 Type 2 diabetes mellitus with hyperglycemia: Secondary | ICD-10-CM | POA: Diagnosis not present

## 2019-07-22 DIAGNOSIS — I10 Essential (primary) hypertension: Secondary | ICD-10-CM | POA: Diagnosis not present

## 2019-08-22 DIAGNOSIS — I10 Essential (primary) hypertension: Secondary | ICD-10-CM | POA: Diagnosis not present

## 2019-08-22 DIAGNOSIS — E7800 Pure hypercholesterolemia, unspecified: Secondary | ICD-10-CM | POA: Diagnosis not present

## 2019-09-16 DIAGNOSIS — I10 Essential (primary) hypertension: Secondary | ICD-10-CM | POA: Diagnosis not present

## 2019-09-19 DIAGNOSIS — I10 Essential (primary) hypertension: Secondary | ICD-10-CM | POA: Diagnosis not present

## 2019-09-19 DIAGNOSIS — E1165 Type 2 diabetes mellitus with hyperglycemia: Secondary | ICD-10-CM | POA: Diagnosis not present

## 2019-09-19 DIAGNOSIS — R5382 Chronic fatigue, unspecified: Secondary | ICD-10-CM | POA: Diagnosis not present

## 2019-09-19 DIAGNOSIS — E782 Mixed hyperlipidemia: Secondary | ICD-10-CM | POA: Diagnosis not present

## 2019-09-19 DIAGNOSIS — R5383 Other fatigue: Secondary | ICD-10-CM | POA: Diagnosis not present

## 2019-09-21 DIAGNOSIS — I10 Essential (primary) hypertension: Secondary | ICD-10-CM | POA: Diagnosis not present

## 2019-09-21 DIAGNOSIS — E782 Mixed hyperlipidemia: Secondary | ICD-10-CM | POA: Diagnosis not present

## 2019-09-22 DIAGNOSIS — D649 Anemia, unspecified: Secondary | ICD-10-CM | POA: Diagnosis not present

## 2019-09-22 DIAGNOSIS — M1612 Unilateral primary osteoarthritis, left hip: Secondary | ICD-10-CM | POA: Diagnosis not present

## 2019-09-22 DIAGNOSIS — I10 Essential (primary) hypertension: Secondary | ICD-10-CM | POA: Diagnosis not present

## 2019-09-22 DIAGNOSIS — L03032 Cellulitis of left toe: Secondary | ICD-10-CM | POA: Diagnosis not present

## 2019-09-22 DIAGNOSIS — K250 Acute gastric ulcer with hemorrhage: Secondary | ICD-10-CM | POA: Diagnosis not present

## 2019-09-22 DIAGNOSIS — Z23 Encounter for immunization: Secondary | ICD-10-CM | POA: Diagnosis not present

## 2019-09-22 DIAGNOSIS — E119 Type 2 diabetes mellitus without complications: Secondary | ICD-10-CM | POA: Diagnosis not present

## 2019-09-29 DIAGNOSIS — L03032 Cellulitis of left toe: Secondary | ICD-10-CM | POA: Diagnosis not present

## 2019-09-29 DIAGNOSIS — M79675 Pain in left toe(s): Secondary | ICD-10-CM | POA: Diagnosis not present

## 2019-10-13 DIAGNOSIS — L03032 Cellulitis of left toe: Secondary | ICD-10-CM | POA: Diagnosis not present

## 2019-10-20 DIAGNOSIS — I10 Essential (primary) hypertension: Secondary | ICD-10-CM | POA: Diagnosis not present

## 2019-10-21 DIAGNOSIS — E782 Mixed hyperlipidemia: Secondary | ICD-10-CM | POA: Diagnosis not present

## 2019-10-21 DIAGNOSIS — I10 Essential (primary) hypertension: Secondary | ICD-10-CM | POA: Diagnosis not present

## 2019-11-16 ENCOUNTER — Other Ambulatory Visit: Payer: Self-pay | Admitting: Cardiology

## 2019-11-18 DIAGNOSIS — I10 Essential (primary) hypertension: Secondary | ICD-10-CM | POA: Diagnosis not present

## 2019-11-21 DIAGNOSIS — I10 Essential (primary) hypertension: Secondary | ICD-10-CM | POA: Diagnosis not present

## 2019-11-22 ENCOUNTER — Encounter: Payer: Self-pay | Admitting: Cardiology

## 2019-11-22 ENCOUNTER — Telehealth: Payer: Self-pay | Admitting: Cardiology

## 2019-11-22 ENCOUNTER — Other Ambulatory Visit: Payer: Self-pay

## 2019-11-22 ENCOUNTER — Ambulatory Visit: Payer: Medicare Other | Admitting: Cardiology

## 2019-11-22 VITALS — BP 201/84 | HR 70 | Temp 97.2°F | Ht 66.0 in | Wt 134.2 lb

## 2019-11-22 DIAGNOSIS — I5032 Chronic diastolic (congestive) heart failure: Secondary | ICD-10-CM

## 2019-11-22 DIAGNOSIS — I2510 Atherosclerotic heart disease of native coronary artery without angina pectoris: Secondary | ICD-10-CM

## 2019-11-22 DIAGNOSIS — I110 Hypertensive heart disease with heart failure: Secondary | ICD-10-CM

## 2019-11-22 DIAGNOSIS — I10 Essential (primary) hypertension: Secondary | ICD-10-CM

## 2019-11-22 DIAGNOSIS — Z955 Presence of coronary angioplasty implant and graft: Secondary | ICD-10-CM

## 2019-11-22 DIAGNOSIS — E785 Hyperlipidemia, unspecified: Secondary | ICD-10-CM

## 2019-11-22 DIAGNOSIS — K257 Chronic gastric ulcer without hemorrhage or perforation: Secondary | ICD-10-CM

## 2019-11-22 DIAGNOSIS — I350 Nonrheumatic aortic (valve) stenosis: Secondary | ICD-10-CM | POA: Diagnosis not present

## 2019-11-22 DIAGNOSIS — R600 Localized edema: Secondary | ICD-10-CM

## 2019-11-22 DIAGNOSIS — E1169 Type 2 diabetes mellitus with other specified complication: Secondary | ICD-10-CM | POA: Diagnosis not present

## 2019-11-22 MED ORDER — CARVEDILOL 12.5 MG PO TABS: 18.7500 mg | ORAL_TABLET | Freq: Two times a day (BID) | ORAL | 3 refills | Status: DC

## 2019-11-22 MED ORDER — BENAZEPRIL HCL 40 MG PO TABS: 40.0000 mg | ORAL_TABLET | Freq: Every day | ORAL | 3 refills | Status: DC

## 2019-11-22 NOTE — Telephone Encounter (Signed)
Pt updated with visitor's policy and verbalized understanding. Husband will assist wife to waiting area and wait in lobby for pt.

## 2019-11-22 NOTE — Progress Notes (Signed)
Primary Care Provider: Estanislado Pandy, MD Cardiologist: Bryan Lemma, MD Electrophysiologist:   Clinic Note: Chief Complaint  Patient presents with  . Follow-up    Annual  . Hypertension    Poorly controlled  . Coronary Artery Disease    No angina    HPI:    [name] is a 83 y.o. female with a PMH notable for CAD (history of inferior non-STEMI with PCI) who presents today for annual follow-up.  [name] was last seen in November 2019.  Was doing well from a cardiac standpoint.  Blood pressures were relatively normal at home.  Was anxious about having to undergo EGD in response some GI bleeding. --  > Noted off-and-on skipped beats but nothing symptomatic.  Recent Hospitalizations:   Admitted December 02, 2018 for gastric ulcer-underwent EGD.  Gastric antral heel ulcers noted.  Erythematous mucosa of the antrum.  Normal duodenal bulb and second portion of duodenum.  2 cm hiatal hernia.  (Had been admitted in June 2019 for melena)  Reviewed  CV studies:    The following studies were reviewed today: (if available, images/films reviewed: From Epic Chart or Care Everywhere) . None since November 2019   Interval History:   [name] returns here today overall doing fairly well from a cardiac standpoint.  She is in great spirits.  She is little bit frustrated because her insurance company has her doing ambulatory blood pressure readings and she gets all the cell phone calls for high blood pressures but nobody does having about it.  The results were not coming to me.  She says that she will have home blood pressure readings are anywhere from the 140s to 160s systolic, but is pretty high today.  They have been off and on high versus good.  But usually the lowest I get is 140s.  When the pressures do get high she will get a headache although she does not have a headache or blurry vision today.  She is due to see her PCP in about a month or so and should be  getting labs checked and also be a follow-up of her blood pressure.  Apparently she had just had her benazepril dose increased to 1-1/2 tablet of the 20 mg tablets of benazepril.  In addition to noticing some high blood pressures, she is off and on had some episodes of fast heart rates that are relatively short-lived lasting less than a minute or so.  She may get little dizzy with these but not anything to suggest any near syncope or syncope.  Otherwise pretty stable cardiac standpoint as far as any angina or heart failure symptoms.  CV Review of Symptoms (Summary) positive for - palpitations, rapid heart rate and Headache and blurred vision from elevated blood pressures with lightheadedness but no syncope negative for - chest pain, dyspnea on exertion, edema, orthopnea, paroxysmal nocturnal dyspnea, shortness of breath or Syncope/near syncope, TIA/amaurosis fugax, claudication  The patient does not have symptoms concerning for COVID-19 infection (fever, chills, cough, or new shortness of breath).  The patient is practicing social distancing. ++ Masking.  She does not really do much going out for groceries/shopping.    REVIEWED OF SYSTEMS   A comprehensive ROS was performed. Review of Systems  Constitutional: Negative for malaise/fatigue and weight loss.  HENT: Negative for congestion and nosebleeds.   Eyes: Negative for blurred vision.  Respiratory: Negative for cough, shortness of breath and wheezing.   Gastrointestinal: Negative for blood in  stool, heartburn and melena.  Genitourinary: Negative for hematuria.  Musculoskeletal: Positive for back pain and joint pain. Negative for falls.  Neurological: Positive for dizziness and headaches (Intermittently, but not always associated with high blood pressure). Negative for focal weakness and weakness.  Endo/Heme/Allergies: Negative for environmental allergies.  Psychiatric/Behavioral: Negative for memory loss. The patient is nervous/anxious  (Because of the home monitoring, she is getting anxious about her blood pressure recordings but never has been nervous before.). The patient does not have insomnia.         Because of the home monitoring, she is getting anxious about her blood pressure recordings but never has been nervous before.She does not notice really any symptoms.  All other systems reviewed and are negative.  I have reviewed and (if needed) personally updated the patient's problem list, medications, allergies, past medical and surgical history, social and family history.   PAST MEDICAL HISTORY   Past Medical History:  Diagnosis Date  . Aortic stenosis, mild 08/2011; 08/2013   Echo 11/09/18: EF 60-65%. Gr 2 DD.  No RWMA.  Mild-Mod AS (mean gradient 19 mmHg). Mild-Mod PAH.  Marland Kitchen. CAD S/P percutaneous coronary angioplasty 1990s, 11/28/2011    a) midRCA PCI 1999 (NIR BMS 3.0 mm x 32 mm); distal  RCA BMS Sept 2012 (Vision BMS 2.5 mm x 15 mm); 12/02: RCA ISR --> Cutting PTCA   . Dyslipidemia, goal LDL below 70 11/28/2011  . Essential hypertension    On ACE inhibitor and ARB? As well as atenolol and hydralazine  . Gastric ulcer 11/2018  . GERD (gastroesophageal reflux disease)   . History of blood transfusion    without reaction  . Myocardial infarction (HCC) 1990s   Had PTCA then PCI on RCA; 11/2011--> Dyspnea and Shoulder pain prior to adm   . Type 2 diabetes mellitus with complication, without long-term current use of insulin (HCC)    no longer taking diabetic medication - diet controlled    PAST SURGICAL HISTORY   Past Surgical History:  Procedure Laterality Date  . ABDOMINAL HYSTERECTOMY    . BIOPSY  06/03/2018   Procedure: BIOPSY;  Surgeon: Malissa Hippoehman, Najeeb U, MD;  Location: [facility];  Service: Endoscopy;;  gastric   . BREAST BIOPSY    . CARDIAC CATHETERIZATION  2012   performed for angina  . CARDIAC CATHETERIZATION    . CORONARY ANGIOPLASTY  11/2006   Cutting Balloon of stent in the RCA  .  ESOPHAGOGASTRODUODENOSCOPY N/A 06/03/2018   Procedure: ESOPHAGOGASTRODUODENOSCOPY (EGD);  Surgeon: Malissa Hippoehman, Najeeb U, MD;  Location: [facility];  Service: Endoscopy;  Laterality: N/A;  1:40  . ESOPHAGOGASTRODUODENOSCOPY N/A 12/02/2018   Procedure: ESOPHAGOGASTRODUODENOSCOPY (EGD);  Surgeon: Malissa Hippoehman, Najeeb U, MD;  Location: [facility];  Service: Endoscopy;  Laterality: N/A;  255  . LEFT HEART CATHETERIZATION WITH CORONARY ANGIOGRAM N/A 11/27/2011   Procedure: LEFT HEART CATHETERIZATION WITH CORONARY ANGIOGRAM;  Surgeon: Marykay Lexavid W , MD;  [facility];  Service: Cardiovascular;  Laterality: N/A;  . PERCUTANEOUS CORONARY STENT INTERVENTION (PCI-S)  1999   Mid RCA - NIR DMS 3.0 mm x 30 mm, dRCA Vision BMS 2.5 mm x 15 mm   . TRANSTHORACIC ECHOCARDIOGRAM  10/2018   EF 60-65%. Gr 2 DD.  No RWMA.  Mild-Mod AS  (mean gradient 19 mmHg). Mild-Mod PAH.  Marland Kitchen. TRANSTHORACIC ECHOCARDIOGRAM  11/2016   2-D Echo 12/12/2016: vigorous LV function with EF 65-70%. GR 2 DD. No RWMA. Mild to mod AS (peak gradient 35 mmHg).  Mod LA dilation. Mild MR, Mod TR. PA pressures ~35-40 mmHg.  . TUBAL LIGATION       MEDICATIONS/ALLERGIES   Current Meds  Medication Sig  . acetaminophen (TYLENOL) 500 MG tablet Take 1,000 mg by mouth 2 (two) times daily as needed (pain.).  Marland Kitchen atorvastatin (LIPITOR) 20 MG tablet Take 20 mg by mouth at bedtime.   . benazepril (LOTENSIN) 40 MG tablet Take 1 tablet (40 mg total) by mouth daily.  . carvedilol (COREG) 12.5 MG tablet Take 1.5 tablets (18.75 mg total) by mouth 2 (two) times daily.  . diclofenac Sodium (VOLTAREN) 1 % GEL   . furosemide (LASIX) 40 MG tablet Take 40 mg by mouth daily as needed for fluid.   . hydrALAZINE (APRESOLINE) 100 MG tablet TAKE ONE TABLET BY MOUTH TWICE DAILY (Patient taking differently: Take 100 mg by mouth 2 (two) times daily. )  . NITROSTAT 0.4 MG SL tablet DISSOLVE 1 TAB UNDER TOUNGE FOR CHEST PAIN. MAY REPEAT EVERY 5 MINUTES FOR 3 DOSES. IF NO  RELIEF CALL 911 OR GO TO ER (Patient taking differently: Place 0.4 mg under the tongue every 5 (five) minutes x 3 doses as needed for chest pain. )  . traMADol (ULTRAM) 50 MG tablet Take 50 mg by mouth every 6 (six) hours as needed for moderate pain.   . [DISCONTINUED] benazepril (LOTENSIN) 40 MG tablet Take 20 mg by mouth 2 (two) times daily.  . [DISCONTINUED] carvedilol (COREG) 12.5 MG tablet TAKE ONE TABLET BY MOUTH TWICE DAILY  . [DISCONTINUED] clopidogrel (PLAVIX) 75 MG tablet Take 1 tablet (75 mg total) by mouth daily.  . [DISCONTINUED] Iron-Vitamin C (VITRON-C) 65-125 MG TABS Take 1 tablet by mouth every other day.   . [DISCONTINUED] Multiple Vitamin (MULTIVITAMIN WITH MINERALS) TABS tablet Take 1 tablet by mouth daily. Women's One A Day Multivitamin  . [DISCONTINUED] pantoprazole (PROTONIX) 40 MG tablet Take 1 tablet (40 mg total) by mouth daily before breakfast.    Allergies  Allergen Reactions  . Valium Nausea And Vomiting     SOCIAL HISTORY/FAMILY HISTORY   Social History   Tobacco Use  . Smoking status: Former Research scientist (life sciences)  . Smokeless tobacco: Never Used  . Tobacco comment: Quit approximately 1993  Substance Use Topics  . Alcohol use: No  . Drug use: No   Social History   Social History Narrative   She is a married mother of 22, grandmother to. 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 Quit smoking in '99, denies alcohol.   She volunteers for [employer].    Family History family history includes Cancer in her mother; Heart Problems in her brother; Heart disease in her brother, father, maternal grandfather, and maternal grandmother; Nephrolithiasis in her brother.   OBJCTIVE -PE, EKG, labs   Wt Readings from Last 3 Encounters:  11/22/19 134 lb 3.2 oz (60.9 kg)  11/17/18 140 lb 6.4 oz (63.7 kg)  06/03/18 142 lb (64.4 kg)    Physical Exam: BP (!) 201/84   Pulse 70   Temp (!) 97.2 F (36.2 C)   Ht 5\' 6"  (1.676 m)   Wt 134 lb 3.2 oz (60.9 kg)   SpO2  96%   BMI 21.66 kg/m  Physical Exam  Constitutional: She is oriented to person, place, and time. She appears well-developed and well-nourished. No distress.  Pretty healthy appearing for age.  Well-groomed.  HENT:  Head: Normocephalic and atraumatic.  Wears tinted glasses  Neck: Normal range of motion. Neck  supple. No hepatojugular reflux and no JVD present. Carotid bruit is not present. No thyromegaly present.  Cardiovascular: Normal rate, regular rhythm, S1 normal and S2 normal.  Occasional extrasystoles are present. PMI is not displaced. Exam reveals distant heart sounds and decreased pulses (Mildly diminished pedal pulses). Exam reveals no S4 and no friction rub.  Murmur heard.  Medium-pitched harsh crescendo-decrescendo midsystolic murmur is present with a grade of 3/6 at the upper right sternal border radiating to the neck. High-pitched blowing holosystolic murmur of grade 2/6 is also present at the apex. Pulmonary/Chest: Effort normal and breath sounds normal. No respiratory distress. She has no wheezes. She has no rales.  Abdominal: Soft. Bowel sounds are normal. She exhibits no distension. There is no abdominal tenderness. There is no rebound.  Musculoskeletal: Normal range of motion.        General: No edema.  Neurological: She is alert and oriented to person, place, and time. No cranial nerve deficit.  Psychiatric: She has a normal mood and affect. Her behavior is normal. Judgment and thought content normal.  Vitals reviewed.   Adult ECG Report  Rate: 66 ;  Rhythm: normal sinus rhythm and normal axis,intevals & durations.  Normal;   Narrative Interpretation: normal  Recent Labs: September 19, 2019: TC 150, TG 125, HDL 59, LDL 72.  A1c 5.5.  Hgb 13.0.  CR 0.76.  K+ 3.8.  TSH 1.87. No results found for: CHOL, HDL, LDLCALC, LDLDIRECT, TRIG, CHOLHDL Lab Results  Component Value Date   CREATININE 0.47 (L) 11/28/2011   BUN 10 11/28/2011   NA 141 11/28/2011   K 3.4 (L) 11/28/2011    CL 104 11/28/2011   CO2 24 11/28/2011    ASSESSMENT/PLAN    Problem List Items Addressed This Visit    Presence of stent in right coronary artery, HSRA for ISR in 2012 (Chronic)    Plavix was stopped due to GI bleed issues.  May want to consider going back on aspirin, but would defer to timing from PCP and gastroenterologist as to the safety of restarting aspirin.      Relevant Orders   EKG 12-Lead (Completed)   Coronary artery disease involving native coronary artery without angina pectoris (Chronic)    Thankfully, despite having significant hypertension, she is not having any angina on current dose of carvedilol.  No requirement of nitroglycerin.  We could potentially add isosorbide dinitrate for blood pressure.  No longer on Plavix.  Can consider aspirin although there is been issues with GI bleed.  If okay with GI medicine, would ask PCP to start back on 81 mg aspirin.       Relevant Medications   carvedilol (COREG) 12.5 MG tablet   benazepril (LOTENSIN) 40 MG tablet   Other Relevant Orders   EKG 12-Lead (Completed)   Hypertensive heart disease with chronic diastolic congestive heart failure (HCC) - Primary (Chronic)    Poorly controlled blood pressure, but thankfully no active heart failure symptoms.  We will titrate up carvedilol and ACE inhibitor doses.  Currently taking as needed Lasix, but can make standing if there is evidence of edema or dyspnea on exertion.  On hydralazine, for true BiDil affect may need to add isosorbide dinitrate 40 mg twice daily.      Relevant Medications   carvedilol (COREG) 12.5 MG tablet   benazepril (LOTENSIN) 40 MG tablet   Other Relevant Orders   EKG 12-Lead (Completed)   Moderate essential hypertension (Chronic)    Blood pressure is pretty high  today.  Remains on 3 meds.  Is not on amlodipine because of edema.  Currently on 100 mg twice daily hydralazine-she will take medicines 3 times daily so no reason to increase to 3 times daily.   Plan: Increase benazepril to 40 mg daily and increase carvedilol to 18.75 mg twice daily. Neck step would be to add diuretic.  She is currently taking as needed Lasix, we can make it standing 20 mg daily.      Relevant Medications   carvedilol (COREG) 12.5 MG tablet   benazepril (LOTENSIN) 40 MG tablet   Hyperlipidemia associated with type 2 diabetes mellitus (HCC) (Chronic)    Recheck labs in September showed LDL of 72.  Pretty close to goal on current dose of atorvastatin. Follow closely, if LDL stays stable or goes up and not down home would probably convert from atorvastatin to rosuvastatin for more potency.      Relevant Medications   benazepril (LOTENSIN) 40 MG tablet   Aortic stenosis, moderate (Chronic)    We reviewed symptoms of aortic stenosis..  Currently we are planning on every 2-year follow-up so she would be ready for echocardiogram prior to her 1 year follow-up in 2021.      Relevant Medications   carvedilol (COREG) 12.5 MG tablet   benazepril (LOTENSIN) 40 MG tablet   Lower extremity edema (Chronic)    Using as needed Lasix.  We may want to consider just having it as a standing dose of 20 mg daily.      Gastric ulcer (Chronic)    Because of PUD issues, we have held aspirin and Plavix.  Will defer timing of when we could potentially restart low-dose 81 mg aspirin to PCP and gastroenterologist pending safety.          COVID-19 Education: The signs and symptoms of COVID-19 were discussed with the patient and how to seek care for testing (follow up with PCP or arrange E-visit).   The importance of social distancing was discussed today.  I spent a total of 18 minutes with the patient and chart review. >  50% of the time was spent in direct patient consultation.  Additional time spent with chart review (studies, outside notes, etc): 6 Total Time: 24 min   Current medicines are reviewed at length with the patient today.  (+/- concerns) none   Patient  Instructions / Medication Changes & Studies & Tests Ordered   Patient Instructions  Medication Instructions:   Increase Carvedilol 18.75 mg  ( 1 and 1/2 tablets ) twice a day   Increase Benazepril to 40 mg daily    *If you need a refill on your cardiac medications before your next appointment, please call your pharmacy*  Lab Work: Not needed  Testing/Procedures: not needed  Follow-Up: At [facility], you and your health needs are our priority.  As part of our continuing mission to provide you with exceptional heart care, we have created designated Provider Care Teams.  These Care Teams include your primary Cardiologist (physician) and Advanced Practice Providers (APPs -  Physician Assistants and Nurse Practitioners) who all work together to provide you with the care you need, when you need it.  Your next appointment:   12 month(s)- Dec 2021  The format for your next appointment:   In Person  Provider:   Bryan Lemma, MD      Studies Ordered:   Orders Placed This Encounter  Procedures  . EKG 12-Lead     Bryan Lemma, M.D.,  M.S. Interventional Cardiologist   Pager # [phone] Phone # [phone] [address] [place], [place] [address]   Thank you for choosing Heartcare at [facility]!!

## 2019-11-22 NOTE — Patient Instructions (Signed)
Medication Instructions:   Increase Carvedilol 18.75 mg  ( 1 and 1/2 tablets ) twice a day   Increase Benazepril to 40 mg daily    *If you need a refill on your cardiac medications before your next appointment, please call your pharmacy*  Lab Work: Not needed  Testing/Procedures: not needed  Follow-Up: At [facility], you and your health needs are our priority.  As part of our continuing mission to provide you with exceptional heart care, we have created designated Provider Care Teams.  These Care Teams include your primary Cardiologist (physician) and Advanced Practice Providers (APPs -  Physician Assistants and Nurse Practitioners) who all work together to provide you with the care you need, when you need it.  Your next appointment:   12 month(s)- Dec 2021  The format for your next appointment:   In Person  Provider:   Glenetta Hew, MD

## 2019-11-22 NOTE — Telephone Encounter (Signed)
New Message     Pt is calling wondering if her husband can assist her to her appt today because she uses a cane  Pt will be leaving by 12pm for her appt   Please call

## 2019-11-23 ENCOUNTER — Encounter: Payer: Self-pay | Admitting: Cardiology

## 2019-11-23 NOTE — Assessment & Plan Note (Signed)
Blood pressure is pretty high today.  Remains on 3 meds.  Is not on amlodipine because of edema.  Currently on 100 mg twice daily hydralazine-she will take medicines 3 times daily so no reason to increase to 3 times daily.  Plan: Increase benazepril to 40 mg daily and increase carvedilol to 18.75 mg twice daily. Neck step would be to add diuretic.  She is currently taking as needed Lasix, we can make it standing 20 mg daily.

## 2019-11-23 NOTE — Assessment & Plan Note (Signed)
Using as needed Lasix.  We may want to consider just having it as a standing dose of 20 mg daily.

## 2019-11-23 NOTE — Assessment & Plan Note (Signed)
Because of PUD issues, we have held aspirin and Plavix.  Will defer timing of when we could potentially restart low-dose 81 mg aspirin to PCP and gastroenterologist pending safety.

## 2019-11-23 NOTE — Assessment & Plan Note (Signed)
Thankfully, despite having significant hypertension, she is not having any angina on current dose of carvedilol.  No requirement of nitroglycerin.  We could potentially add isosorbide dinitrate for blood pressure.  No longer on Plavix.  Can consider aspirin although there is been issues with GI bleed.  If okay with GI medicine, would ask PCP to start back on 81 mg aspirin.

## 2019-11-23 NOTE — Assessment & Plan Note (Signed)
Recheck labs in September showed LDL of 72.  Pretty close to goal on current dose of atorvastatin. Follow closely, if LDL stays stable or goes up and not down home would probably convert from atorvastatin to rosuvastatin for more potency.

## 2019-11-23 NOTE — Assessment & Plan Note (Signed)
Poorly controlled blood pressure, but thankfully no active heart failure symptoms.  We will titrate up carvedilol and ACE inhibitor doses.  Currently taking as needed Lasix, but can make standing if there is evidence of edema or dyspnea on exertion.  On hydralazine, for true BiDil affect may need to add isosorbide dinitrate 40 mg twice daily.

## 2019-11-23 NOTE — Assessment & Plan Note (Signed)
We reviewed symptoms of aortic stenosis..  Currently we are planning on every 2-year follow-up so she would be ready for echocardiogram prior to her 1 year follow-up in 2021.

## 2019-11-23 NOTE — Assessment & Plan Note (Signed)
Plavix was stopped due to GI bleed issues.  May want to consider going back on aspirin, but would defer to timing from PCP and gastroenterologist as to the safety of restarting aspirin.

## 2019-12-22 DIAGNOSIS — I10 Essential (primary) hypertension: Secondary | ICD-10-CM | POA: Diagnosis not present

## 2020-01-20 DIAGNOSIS — E7849 Other hyperlipidemia: Secondary | ICD-10-CM | POA: Diagnosis not present

## 2020-01-20 DIAGNOSIS — I10 Essential (primary) hypertension: Secondary | ICD-10-CM | POA: Diagnosis not present

## 2020-01-31 DIAGNOSIS — E782 Mixed hyperlipidemia: Secondary | ICD-10-CM | POA: Diagnosis not present

## 2020-01-31 DIAGNOSIS — M545 Low back pain: Secondary | ICD-10-CM | POA: Diagnosis not present

## 2020-01-31 DIAGNOSIS — M1612 Unilateral primary osteoarthritis, left hip: Secondary | ICD-10-CM | POA: Diagnosis not present

## 2020-01-31 DIAGNOSIS — E119 Type 2 diabetes mellitus without complications: Secondary | ICD-10-CM | POA: Diagnosis not present

## 2020-01-31 DIAGNOSIS — I10 Essential (primary) hypertension: Secondary | ICD-10-CM | POA: Diagnosis not present

## 2020-01-31 DIAGNOSIS — R5383 Other fatigue: Secondary | ICD-10-CM | POA: Diagnosis not present

## 2020-02-16 DIAGNOSIS — I10 Essential (primary) hypertension: Secondary | ICD-10-CM | POA: Diagnosis not present

## 2020-02-16 DIAGNOSIS — E7849 Other hyperlipidemia: Secondary | ICD-10-CM | POA: Diagnosis not present

## 2020-02-17 DIAGNOSIS — I10 Essential (primary) hypertension: Secondary | ICD-10-CM | POA: Diagnosis not present

## 2020-02-17 DIAGNOSIS — E7849 Other hyperlipidemia: Secondary | ICD-10-CM | POA: Diagnosis not present

## 2020-02-29 ENCOUNTER — Encounter (HOSPITAL_COMMUNITY): Payer: Self-pay

## 2020-02-29 ENCOUNTER — Emergency Department (HOSPITAL_COMMUNITY): Payer: Medicare Other

## 2020-02-29 ENCOUNTER — Inpatient Hospital Stay (HOSPITAL_COMMUNITY)
Admission: EM | Admit: 2020-02-29 | Discharge: 2020-03-02 | DRG: 683 | Disposition: A | Discharge status: Home / Self Care | Payer: Medicare Other | Attending: Internal Medicine | Admitting: Internal Medicine

## 2020-02-29 ENCOUNTER — Other Ambulatory Visit: Payer: Self-pay

## 2020-02-29 DIAGNOSIS — R079 Chest pain, unspecified: Secondary | ICD-10-CM | POA: Diagnosis not present

## 2020-02-29 DIAGNOSIS — Z87891 Personal history of nicotine dependence: Secondary | ICD-10-CM | POA: Diagnosis not present

## 2020-02-29 DIAGNOSIS — E785 Hyperlipidemia, unspecified: Secondary | ICD-10-CM | POA: Diagnosis not present

## 2020-02-29 DIAGNOSIS — I25110 Atherosclerotic heart disease of native coronary artery with unstable angina pectoris: Secondary | ICD-10-CM | POA: Diagnosis not present

## 2020-02-29 DIAGNOSIS — I110 Hypertensive heart disease with heart failure: Secondary | ICD-10-CM | POA: Diagnosis not present

## 2020-02-29 DIAGNOSIS — E119 Type 2 diabetes mellitus without complications: Secondary | ICD-10-CM | POA: Diagnosis present

## 2020-02-29 DIAGNOSIS — I361 Nonrheumatic tricuspid (valve) insufficiency: Secondary | ICD-10-CM | POA: Diagnosis not present

## 2020-02-29 DIAGNOSIS — K259 Gastric ulcer, unspecified as acute or chronic, without hemorrhage or perforation: Secondary | ICD-10-CM | POA: Diagnosis not present

## 2020-02-29 DIAGNOSIS — Z20822 Contact with and (suspected) exposure to covid-19: Secondary | ICD-10-CM | POA: Diagnosis present

## 2020-02-29 DIAGNOSIS — E7800 Pure hypercholesterolemia, unspecified: Secondary | ICD-10-CM | POA: Diagnosis not present

## 2020-02-29 DIAGNOSIS — I350 Nonrheumatic aortic (valve) stenosis: Secondary | ICD-10-CM | POA: Diagnosis not present

## 2020-02-29 DIAGNOSIS — Z955 Presence of coronary angioplasty implant and graft: Secondary | ICD-10-CM | POA: Diagnosis not present

## 2020-02-29 DIAGNOSIS — I340 Nonrheumatic mitral (valve) insufficiency: Secondary | ICD-10-CM | POA: Diagnosis not present

## 2020-02-29 DIAGNOSIS — N179 Acute kidney failure, unspecified: Secondary | ICD-10-CM | POA: Diagnosis not present

## 2020-02-29 DIAGNOSIS — I252 Old myocardial infarction: Secondary | ICD-10-CM

## 2020-02-29 DIAGNOSIS — Z8249 Family history of ischemic heart disease and other diseases of the circulatory system: Secondary | ICD-10-CM

## 2020-02-29 DIAGNOSIS — K219 Gastro-esophageal reflux disease without esophagitis: Secondary | ICD-10-CM | POA: Diagnosis present

## 2020-02-29 DIAGNOSIS — Z90710 Acquired absence of both cervix and uterus: Secondary | ICD-10-CM | POA: Diagnosis not present

## 2020-02-29 DIAGNOSIS — Z79899 Other long term (current) drug therapy: Secondary | ICD-10-CM

## 2020-02-29 DIAGNOSIS — I200 Unstable angina: Secondary | ICD-10-CM

## 2020-02-29 DIAGNOSIS — R0789 Other chest pain: Secondary | ICD-10-CM | POA: Diagnosis not present

## 2020-02-29 DIAGNOSIS — I5032 Chronic diastolic (congestive) heart failure: Secondary | ICD-10-CM | POA: Diagnosis present

## 2020-02-29 DIAGNOSIS — Z791 Long term (current) use of non-steroidal anti-inflammatories (NSAID): Secondary | ICD-10-CM

## 2020-02-29 DIAGNOSIS — Z743 Need for continuous supervision: Secondary | ICD-10-CM | POA: Diagnosis not present

## 2020-02-29 DIAGNOSIS — I10 Essential (primary) hypertension: Secondary | ICD-10-CM | POA: Diagnosis not present

## 2020-02-29 DIAGNOSIS — R0689 Other abnormalities of breathing: Secondary | ICD-10-CM | POA: Diagnosis not present

## 2020-02-29 LAB — BASIC METABOLIC PANEL
Anion gap: 10 (ref 5–15)
BUN: 14 mg/dL (ref 8–23)
CO2: 29 mmol/L (ref 22–32)
Calcium: 9.9 mg/dL (ref 8.9–10.3)
Chloride: 102 mmol/L (ref 98–111)
Creatinine, Ser: 0.58 mg/dL (ref 0.44–1.00)
GFR calc Af Amer: >60 mL/min (ref >60)
GFR calc non Af Amer: >60 mL/min (ref >60)
Glucose, Bld: 128 mg/dL — ABNORMAL HIGH (ref 70–99)
Potassium: 3.7 mmol/L (ref 3.5–5.1)
Sodium: 141 mmol/L (ref 135–145)

## 2020-02-29 LAB — TROPONIN I (HIGH SENSITIVITY)
Troponin I (High Sensitivity): 12 ng/L (ref <18)
Troponin I (High Sensitivity): 13 ng/L (ref <18)

## 2020-02-29 LAB — CBC
HCT: 39.6 % (ref 36.0–46.0)
Hemoglobin: 13.2 g/dL (ref 12.0–15.0)
MCH: 34.1 pg — ABNORMAL HIGH (ref 26.0–34.0)
MCHC: 33.3 g/dL (ref 30.0–36.0)
MCV: 102.3 fL — ABNORMAL HIGH (ref 80.0–100.0)
Platelets: 208 10*3/uL (ref 150–400)
RBC: 3.87 MIL/uL (ref 3.87–5.11)
RDW: 13.1 % (ref 11.5–15.5)
WBC: 4.2 10*3/uL (ref 4.0–10.5)
nRBC: 0 % (ref 0.0–0.2)

## 2020-02-29 LAB — RESPIRATORY PANEL BY RT PCR (FLU A&B, COVID)
Influenza A by PCR: NEGATIVE
Influenza B by PCR: NEGATIVE
SARS Coronavirus 2 by RT PCR: NEGATIVE

## 2020-02-29 LAB — HEMOGLOBIN A1C
Hgb A1c MFr Bld: 5.1 % (ref 4.8–5.6)
Mean Plasma Glucose: 99.67 mg/dL

## 2020-02-29 LAB — GLUCOSE, CAPILLARY
Glucose-Capillary: 106 mg/dL — ABNORMAL HIGH (ref 70–99)
Glucose-Capillary: 94 mg/dL (ref 70–99)

## 2020-02-29 MED ORDER — SODIUM CHLORIDE 0.9% FLUSH: 3.0000 mL | INTRAVENOUS | Status: DC | PRN

## 2020-02-29 MED ORDER — SODIUM CHLORIDE 0.9 % IV SOLN: 250.0000 mL | INTRAVENOUS | Status: DC | PRN

## 2020-02-29 MED ORDER — ENOXAPARIN SODIUM 40 MG/0.4ML ~~LOC~~ SOLN
40.0000 mg | SUBCUTANEOUS | Status: DC
  Administered 2020-02-29: 40 mg via SUBCUTANEOUS
  Filled 2020-02-29: qty 0.4

## 2020-02-29 MED ORDER — ACETAMINOPHEN 500 MG PO TABS: 1000.0000 mg | ORAL_TABLET | Freq: Two times a day (BID) | ORAL | Status: DC | PRN

## 2020-02-29 MED ORDER — ONDANSETRON HCL 4 MG/2ML IJ SOLN: 4.0000 mg | Freq: Four times a day (QID) | INTRAMUSCULAR | Status: DC | PRN

## 2020-02-29 MED ORDER — ACETAMINOPHEN 325 MG PO TABS: 650.0000 mg | ORAL_TABLET | Freq: Four times a day (QID) | ORAL | Status: DC | PRN

## 2020-02-29 MED ORDER — FUROSEMIDE 40 MG PO TABS: 40.0000 mg | ORAL_TABLET | Freq: Every day | ORAL | Status: DC | PRN

## 2020-02-29 MED ORDER — CARVEDILOL 3.125 MG PO TABS
18.7500 mg | ORAL_TABLET | Freq: Two times a day (BID) | ORAL | Status: DC
  Administered 2020-02-29 – 2020-03-02 (×4): 18.75 mg via ORAL
  Filled 2020-02-29 (×3): qty 1
  Filled 2020-02-29: qty 2

## 2020-02-29 MED ORDER — ONDANSETRON HCL 4 MG PO TABS: 4.0000 mg | ORAL_TABLET | Freq: Four times a day (QID) | ORAL | Status: DC | PRN

## 2020-02-29 MED ORDER — ACETAMINOPHEN 650 MG RE SUPP: 650.0000 mg | Freq: Four times a day (QID) | RECTAL | Status: DC | PRN

## 2020-02-29 MED ORDER — SODIUM CHLORIDE 0.9% FLUSH
3.0000 mL | Freq: Two times a day (BID) | INTRAVENOUS | Status: DC
  Administered 2020-02-29 – 2020-03-02 (×3): 3 mL via INTRAVENOUS

## 2020-02-29 MED ORDER — INSULIN ASPART 100 UNIT/ML ~~LOC~~ SOLN: 0.0000 [IU] | Freq: Three times a day (TID) | SUBCUTANEOUS | Status: DC

## 2020-02-29 MED ORDER — NITROGLYCERIN 0.4 MG SL SUBL: 0.4000 mg | SUBLINGUAL_TABLET | SUBLINGUAL | Status: DC | PRN

## 2020-02-29 MED ORDER — HYDRALAZINE HCL 25 MG PO TABS
100.0000 mg | ORAL_TABLET | Freq: Two times a day (BID) | ORAL | Status: DC
  Administered 2020-02-29: 100 mg via ORAL
  Filled 2020-02-29: qty 4

## 2020-02-29 MED ORDER — TRAMADOL HCL 50 MG PO TABS
50.0000 mg | ORAL_TABLET | Freq: Four times a day (QID) | ORAL | Status: DC | PRN
  Administered 2020-02-29 – 2020-03-01 (×2): 50 mg via ORAL
  Filled 2020-02-29 (×2): qty 1

## 2020-02-29 MED ORDER — BENAZEPRIL HCL 10 MG PO TABS
40.0000 mg | ORAL_TABLET | Freq: Every day | ORAL | Status: DC
  Administered 2020-02-29: 40 mg via ORAL
  Filled 2020-02-29: qty 4

## 2020-02-29 MED ORDER — PANTOPRAZOLE SODIUM 40 MG PO TBEC
40.0000 mg | DELAYED_RELEASE_TABLET | Freq: Every day | ORAL | Status: DC
  Administered 2020-02-29 – 2020-03-02 (×3): 40 mg via ORAL
  Filled 2020-02-29 (×3): qty 1

## 2020-02-29 MED ORDER — ISOSORBIDE MONONITRATE ER 30 MG PO TB24
15.0000 mg | ORAL_TABLET | Freq: Every day | ORAL | Status: DC
  Administered 2020-02-29 – 2020-03-02 (×3): 15 mg via ORAL
  Filled 2020-02-29 (×3): qty 1

## 2020-02-29 MED ORDER — ATORVASTATIN CALCIUM 20 MG PO TABS
20.0000 mg | ORAL_TABLET | Freq: Every day | ORAL | Status: DC
  Administered 2020-02-29 – 2020-03-01 (×2): 20 mg via ORAL
  Filled 2020-02-29 (×2): qty 1

## 2020-02-29 NOTE — ED Notes (Addendum)
Pt son, Brett Canales, called and inquired about pt status. Pt verbalized consent to discuss care plan with Brett Canales. Brett Canales contact information: [phone].

## 2020-02-29 NOTE — Plan of Care (Signed)
  Problem: Education: Goal: Knowledge of General Education information will improve Description: Including pain rating scale, medication(s)/side effects and non-pharmacologic comfort measures Outcome: Progressing   Problem: Pain Managment: Goal: General experience of comfort will improve Outcome: Progressing   Problem: Skin Integrity: Goal: Risk for impaired skin integrity will decrease Outcome: Progressing   Problem: Education: Goal: Knowledge of General Education information will improve Description: Including pain rating scale, medication(s)/side effects and non-pharmacologic comfort measures Outcome: Progressing   Problem: Activity: Goal: Risk for activity intolerance will decrease Outcome: Progressing   Problem: Safety: Goal: Ability to remain free from injury will improve Outcome: Progressing   Problem: Skin Integrity: Goal: Risk for impaired skin integrity will decrease Outcome: Progressing

## 2020-02-29 NOTE — ED Provider Notes (Signed)
[facility] EMERGENCY DEPARTMENT Provider Note   CSN: [id number] Arrival date & time: 02/29/20  1213     History Chief Complaint  Patient presents with  . Chest Pain    [name] is a 84 y.o. female.  She is a retired Engineer, civil (consulting).  History of MI and stents.  Planing of chest pressure 9 out of 10 intensity that started around 3 AM.  She went back to sleep but the discomfort was still there this morning.  Not associated with diaphoresis shortness of breath nausea dizziness.  No radiation.  Tried Tums with some improvement.  Given aspirin and nitro in the ambulance with some improvement.  Currently rates it as 5 out of 10.  Similar to when she had her MI although at that time later radiated into her right shoulder.  The history is provided by the patient.  Chest Pain Pain location:  Substernal area Pain quality: pressure   Pain radiates to:  Does not radiate Pain severity:  Moderate Onset quality:  Gradual Duration:  9 hours Timing:  Constant Progression:  Improving Chronicity:  Recurrent Context: at rest   Relieved by:  Nothing Worsened by:  Nothing Ineffective treatments:  Antacids, aspirin and nitroglycerin Associated symptoms: no abdominal pain, no cough, no diaphoresis, no fever, no headache, no lower extremity edema, no nausea, no shortness of breath and no vomiting   Risk factors: coronary artery disease, diabetes mellitus, high cholesterol and hypertension     HPI: A 84 year old patient with a history of treated diabetes, hypertension and hypercholesterolemia presents for evaluation of chest pain. Initial onset of pain was more than 6 hours ago. The patient's chest pain is described as heaviness/pressure/tightness and is not worse with exertion. The patient's chest pain is middle- or left-sided, is not well-localized, is not sharp and does not radiate to the arms/jaw/neck. The patient does not complain of nausea and denies diaphoresis. The patient has no history of stroke,  has no history of peripheral artery disease, has not smoked in the past 90 days, has no relevant family history of coronary artery disease (first degree relative at less than age 35) and does not have an elevated BMI (>=30).   Past Medical History:  Diagnosis Date  . Aortic stenosis, mild 08/2011; 08/2013   Echo 11/09/18: EF 60-65%. Gr 2 DD.  No RWMA.  Mild-Mod AS (mean gradient 19 mmHg). Mild-Mod PAH.  Marland Kitchen CAD S/P percutaneous coronary angioplasty 1990s, 11/28/2011    a) midRCA PCI 1999 (NIR BMS 3.0 mm x 32 mm); distal  RCA BMS Sept 2012 (Vision BMS 2.5 mm x 15 mm); 12/02: RCA ISR --> Cutting PTCA   . Dyslipidemia, goal LDL below 70 11/28/2011  . Essential hypertension    On ACE inhibitor and ARB? As well as atenolol and hydralazine  . Gastric ulcer 11/2018  . GERD (gastroesophageal reflux disease)   . History of blood transfusion    without reaction  . Myocardial infarction (HCC) 1990s   Had PTCA then PCI on RCA; 11/2011--> Dyspnea and Shoulder pain prior to adm   . Type 2 diabetes mellitus with complication, without long-term current use of insulin (HCC)    no longer taking diabetic medication - diet controlled    Patient Active Problem List   Diagnosis Date Noted  . Gastric ulcer 10/01/2018  . Melena 06/01/2018  . Lower extremity edema 05/11/2017  . Hypertensive heart disease with chronic diastolic congestive heart failure (HCC) 05/11/2017  . Coronary stent restenosis due to progression  of disease 11/30/2011  . Presence of stent in right coronary artery, HSRA for ISR in 2012 11/28/2011    Class: History of  . Moderate essential hypertension 11/28/2011    Class: History of  . Hyperlipidemia associated with type 2 diabetes mellitus ([place]) 11/28/2011    Class: History of  . History of: Acute coronary syndrome 11/28/2011    Class: History of  . Anemia associated with acute blood loss 11/28/2011  . Diabetes mellitus 11/28/2011  . Aortic stenosis, moderate 11/28/2011  . Coronary artery  disease involving native coronary artery without angina pectoris 07/31/2011    Past Surgical History:  Procedure Laterality Date  . ABDOMINAL HYSTERECTOMY    . BIOPSY  06/03/2018   Procedure: BIOPSY;  Surgeon: Rogene Houston, MD;  [facility]: [facility];  Service: Endoscopy;;  gastric   . BREAST BIOPSY    . CARDIAC CATHETERIZATION  2012   performed for angina  . CARDIAC CATHETERIZATION    . CORONARY ANGIOPLASTY  11/2006   Cutting Balloon of stent in the RCA  . ESOPHAGOGASTRODUODENOSCOPY N/A 06/03/2018   Procedure: ESOPHAGOGASTRODUODENOSCOPY (EGD);  Surgeon: Rogene Houston, MD;  Location: [facility];  Service: Endoscopy;  Laterality: N/A;  1:40  . ESOPHAGOGASTRODUODENOSCOPY N/A 12/02/2018   Procedure: ESOPHAGOGASTRODUODENOSCOPY (EGD);  Surgeon: Rogene Houston, MD;  [facility]: [facility];  Service: Endoscopy;  Laterality: N/A;  255  . LEFT HEART CATHETERIZATION WITH CORONARY ANGIOGRAM N/A 11/27/2011   Procedure: LEFT HEART CATHETERIZATION WITH CORONARY ANGIOGRAM;  Surgeon: Leonie Man, MD;  Location: [facility] CATH LAB;  Service: Cardiovascular;  Laterality: N/A;  . PERCUTANEOUS CORONARY STENT INTERVENTION (PCI-S)  1999   Mid RCA - NIR DMS 3.0 mm x 30 mm, dRCA Vision BMS 2.5 mm x 15 mm   . TRANSTHORACIC ECHOCARDIOGRAM  10/2018   EF 60-65%. Gr 2 DD.  No RWMA.  Mild-Mod AS  (mean gradient 19 mmHg). Mild-Mod PAH.  Marland Kitchen TRANSTHORACIC ECHOCARDIOGRAM  11/2016   2-D Echo 12/12/2016: vigorous LV function with EF 65-70%. GR 2 DD. No RWMA. Mild to mod AS (peak gradient 35 mmHg). Mod LA dilation. Mild MR, Mod TR. PA pressures ~35-40 mmHg.  . TUBAL LIGATION       OB History   No obstetric history on file.     Family History  Problem Relation Age of Onset  . Cancer Mother   . Heart disease Father   . Heart disease Maternal Grandmother   . Heart disease Maternal Grandfather   . Heart disease Brother   . Nephrolithiasis Brother        accidental death  . Heart Problems Brother         CABG    Social History   Tobacco Use  . Smoking status: Former Research scientist (life sciences)  . Smokeless tobacco: Never Used  . Tobacco comment: Quit approximately 1993  Substance Use Topics  . Alcohol use: No  . Drug use: No    Home Medications Prior to Admission medications   Medication Sig Start Date End Date Taking? Authorizing Provider  acetaminophen (TYLENOL) 500 MG tablet Take 1,000 mg by mouth 2 (two) times daily as needed (pain.).    [provider]  atorvastatin (LIPITOR) 20 MG tablet Take 20 mg by mouth at bedtime.     [provider]  benazepril (LOTENSIN) 40 MG tablet Take 1 tablet (40 mg total) by mouth daily. 11/22/19   Leonie Man, MD  carvedilol (COREG) 12.5 MG tablet Take 1.5 tablets (18.75 mg  total) by mouth 2 (two) times daily. 11/22/19   Marykay Lex, MD  diclofenac Sodium (VOLTAREN) 1 % GEL  07/25/19   [provider]  furosemide (LASIX) 40 MG tablet Take 40 mg by mouth daily as needed for fluid.     [provider]  hydrALAZINE (APRESOLINE) 100 MG tablet TAKE ONE TABLET BY MOUTH TWICE DAILY Patient taking differently: Take 100 mg by mouth 2 (two) times daily.  11/09/17   Marykay Lex, MD  NITROSTAT 0.4 MG SL tablet DISSOLVE 1 TAB UNDER TOUNGE FOR CHEST PAIN. MAY REPEAT EVERY 5 MINUTES FOR 3 DOSES. IF NO RELIEF CALL 911 OR GO TO ER Patient taking differently: Place 0.4 mg under the tongue every 5 (five) minutes x 3 doses as needed for chest pain.  11/24/16   Marykay Lex, MD  traMADol (ULTRAM) 50 MG tablet Take 50 mg by mouth every 6 (six) hours as needed for moderate pain.  05/30/15   [provider]    Allergies    Valium  Review of Systems   Review of Systems  Constitutional: Negative for diaphoresis and fever.  HENT: Negative for sore throat.   Eyes: Negative for visual disturbance.  Respiratory: Negative for cough and shortness of breath.   Cardiovascular: Positive for chest pain.  Gastrointestinal: Negative for  abdominal pain, nausea and vomiting.  Genitourinary: Negative for dysuria.  Musculoskeletal: Negative for neck pain.  Skin: Negative for rash.  Neurological: Negative for headaches.    Physical Exam Updated Vital Signs BP (!) 194/90 (BP Location: Right Arm)   Pulse 99   Temp 98.4 F (36.9 C) (Oral)   Resp 20   Ht 5\' 6"  (1.676 m)   Wt 63.5 kg   SpO2 99%   BMI 22.60 kg/m   Physical Exam Vitals and nursing note reviewed.  Constitutional:      General: She is not in acute distress.    Appearance: She is well-developed.  HENT:     Head: Normocephalic and atraumatic.  Eyes:     Conjunctiva/sclera: Conjunctivae normal.  Cardiovascular:     Rate and Rhythm: Normal rate and regular rhythm.     Heart sounds: Normal heart sounds.  Pulmonary:     Effort: Pulmonary effort is normal. No respiratory distress.     Breath sounds: Normal breath sounds.  Abdominal:     Palpations: Abdomen is soft.     Tenderness: There is no abdominal tenderness.  Musculoskeletal:        General: Normal range of motion.     Cervical back: Neck supple.     Right lower leg: No tenderness. No edema.     Left lower leg: No tenderness. No edema.  Skin:    General: Skin is warm and dry.     Capillary Refill: Capillary refill takes less than 2 seconds.  Neurological:     General: No focal deficit present.     Mental Status: She is alert.     ED Results / Procedures / Treatments   Labs (all labs ordered are listed, but only abnormal results are displayed) Labs Reviewed  BASIC METABOLIC PANEL - Abnormal; Notable for the following components:      Result Value   Glucose, Bld 128 (*)    All other components within normal limits  CBC - Abnormal; Notable for the following components:   MCV 102.3 (*)    MCH 34.1 (*)    All other components within normal limits  RESPIRATORY  PANEL BY RT PCR (FLU A&B, COVID)  HEMOGLOBIN A1C  BASIC METABOLIC PANEL  TROPONIN I (HIGH SENSITIVITY)  TROPONIN I (HIGH  SENSITIVITY)    EKG EKG Interpretation  Date/Time:  Wednesday February 29 2020 12:31:16 EST Ventricular Rate:  81 PR Interval:    QRS Duration: 80 QT Interval:  400 QTC Calculation: 465 R Axis:   80 Text Interpretation: Sinus rhythm Borderline repolarization abnormality Baseline wander in lead(s) III No significant change since prior 12/12 Confirmed by Meridee Score [phone]) on 02/29/2020 12:34:35 PM   Radiology DG Chest Port 1 View  Result Date: 02/29/2020 CLINICAL DATA:  Chest pain and pressure beginning last night. EXAM: PORTABLE CHEST 1 VIEW COMPARISON:  09/03/2011 FINDINGS: Heart size is normal. Chronic aortic atherosclerosis. Possible emphysema. No sign of active infiltrate, mass, effusion or collapse. No acute bone finding. IMPRESSION: No active disease. Possible emphysema. Aortic atherosclerosis. Electronically Signed   By: Paulina Fusi M.D.   On: 02/29/2020 12:42    Procedures Procedures (including critical care time)  Medications Ordered in ED Medications  nitroGLYCERIN (NITROSTAT) SL tablet 0.4 mg (has no administration in time range)  acetaminophen (TYLENOL) tablet 1,000 mg (has no administration in time range)  traMADol (ULTRAM) tablet 50 mg (50 mg Oral Given 02/29/20 1645)  atorvastatin (LIPITOR) tablet 20 mg (has no administration in time range)  benazepril (LOTENSIN) tablet 40 mg (40 mg Oral Given 02/29/20 1644)  carvedilol (COREG) tablet 18.75 mg (has no administration in time range)  furosemide (LASIX) tablet 40 mg (has no administration in time range)  hydrALAZINE (APRESOLINE) tablet 100 mg (has no administration in time range)  isosorbide mononitrate (IMDUR) 24 hr tablet 15 mg (15 mg Oral Given 02/29/20 1645)  enoxaparin (LOVENOX) injection 40 mg (40 mg Subcutaneous Given 02/29/20 1645)  sodium chloride flush (NS) 0.9 % injection 3 mL (has no administration in time range)  sodium chloride flush (NS) 0.9 % injection 3 mL (has no administration in time range)  0.9 %   sodium chloride infusion (has no administration in time range)  acetaminophen (TYLENOL) tablet 650 mg (has no administration in time range)    Or  acetaminophen (TYLENOL) suppository 650 mg (has no administration in time range)  ondansetron (ZOFRAN) tablet 4 mg (has no administration in time range)    Or  ondansetron (ZOFRAN) injection 4 mg (has no administration in time range)  insulin aspart (novoLOG) injection 0-9 Units (0 Units Subcutaneous Not Given 02/29/20 1652)  pantoprazole (PROTONIX) EC tablet 40 mg (40 mg Oral Given 02/29/20 1645)    ED Course  I have reviewed the triage vital signs and the nursing notes.  Pertinent labs & imaging results that were available during my care of the patient were reviewed by me and considered in my medical decision making (see chart for details).  Clinical Course as of Feb 29 1724  Wed Feb 29, 2020  1234 Differential includes ACS, GERD, pneumonia, PE, vascular, anemia   [MB]  1250 Chest x-ray interpreted by me as no pneumothorax no gross infiltrates.   [MB]  1414 Patient states she is not pain-free but is much improved.  Reviewed the story and EKGs with Dr. Purvis Sheffield from cardiology.  He recommended that she come in to be ruled out and if her troponins are rising have the hospitalist team reach out to them for further recommendations.   [MB]  1414 Reviewed all this with the patient and she is agreeable to admission.   [MB]    Clinical Course User  Index [MB] Terrilee FilesButler, Lashaun Poch C, MD   MDM Rules/Calculators/A&P HEAR Score: 6                     Final Clinical Impression(s) / ED Diagnoses Final diagnoses:  Unstable angina [facility](HCC)    Rx / DC Orders ED Discharge Orders    None       Terrilee FilesButler, Colvin Blatt C, MD 02/29/20 1726

## 2020-02-29 NOTE — H&P (Signed)
History and Physical    [name] YOV:[id number] DOB: [birth year] DOA: 02/29/2020  PCP: Estanislado Pandy, MD   Patient coming from:   Chief Complaint:   HPI: [name] is a 84 y.o. female with medical history significant for CAD with prior stent to right coronary artery, hypertension, chronic diastolic heart failure, dyslipidemia, type 2 diabetes, moderate aortic stenosis, gastric ulcer, and chronic lower extremity edema, who presented to the ED with complaints of substernal chest pressure that began at approximately 3 AM and awoke her from sleep.  She denies any radiation of pain and rated the pain level 5/10.  She denies any diaphoresis, nausea, or vomiting issues.  She denies any shortness of breath or palpitations as well.  No fevers, chills, or cough noted.  She arrived to the ED via EMS and was given full dose aspirin and 1 of nitro with no significant change to her chest pressure.  She is feeling somewhat better now several hours later.   ED Course: EKG was sinus rhythm no acute findings.  Chest x-ray with no acute findings.  Troponin initially 12 and then subsequently 13.  She continues to have mild chest pressure at this time.  Review of Systems: As noted above, otherwise negative.  Past Medical History:  Diagnosis Date  . Aortic stenosis, mild 08/2011; 08/2013   Echo 11/09/18: EF 60-65%. Gr 2 DD.  No RWMA.  Mild-Mod AS (mean gradient 19 mmHg). Mild-Mod PAH.  Marland Kitchen CAD S/P percutaneous coronary angioplasty 1990s, 11/28/2011    a) midRCA PCI 1999 (NIR BMS 3.0 mm x 32 mm); distal  RCA BMS Sept 2012 (Vision BMS 2.5 mm x 15 mm); 12/02: RCA ISR --> Cutting PTCA   . Dyslipidemia, goal LDL below 70 11/28/2011  . Essential hypertension    On ACE inhibitor and ARB? As well as atenolol and hydralazine  . Gastric ulcer 11/2018  . GERD (gastroesophageal reflux disease)   . History of blood transfusion    without reaction  . Myocardial infarction (HCC) 1990s   Had PTCA then PCI on RCA;  11/2011--> Dyspnea and Shoulder pain prior to adm   . Type 2 diabetes mellitus with complication, without long-term current use of insulin (HCC)    no longer taking diabetic medication - diet controlled    Past Surgical History:  Procedure Laterality Date  . ABDOMINAL HYSTERECTOMY    . BIOPSY  06/03/2018   Procedure: BIOPSY;  Surgeon: Malissa Hippo, MD;  [facility];  Service: Endoscopy;;  gastric   . BREAST BIOPSY    . CARDIAC CATHETERIZATION  2012   performed for angina  . CARDIAC CATHETERIZATION    . CORONARY ANGIOPLASTY  11/2006   Cutting Balloon of stent in the RCA  . ESOPHAGOGASTRODUODENOSCOPY N/A 06/03/2018   Procedure: ESOPHAGOGASTRODUODENOSCOPY (EGD);  Surgeon: Malissa Hippo, MD;  [facility]: [facility];  Service: Endoscopy;  Laterality: N/A;  1:40  . ESOPHAGOGASTRODUODENOSCOPY N/A 12/02/2018   Procedure: ESOPHAGOGASTRODUODENOSCOPY (EGD);  Surgeon: Malissa Hippo, MD;  [facility]: [facility];  Service: Endoscopy;  Laterality: N/A;  255  . LEFT HEART CATHETERIZATION WITH CORONARY ANGIOGRAM N/A 11/27/2011   Procedure: LEFT HEART CATHETERIZATION WITH CORONARY ANGIOGRAM;  Surgeon: Marykay Lex, MD;  Location: [facility] CATH LAB;  Service: Cardiovascular;  Laterality: N/A;  . PERCUTANEOUS CORONARY STENT INTERVENTION (PCI-S)  1999   Mid RCA - NIR DMS 3.0 mm x 30 mm, dRCA Vision BMS 2.5 mm x 15 mm   . TRANSTHORACIC  ECHOCARDIOGRAM  10/2018   EF 60-65%. Gr 2 DD.  No RWMA.  Mild-Mod AS  (mean gradient 19 mmHg). Mild-Mod PAH.  Marland Kitchen TRANSTHORACIC ECHOCARDIOGRAM  11/2016   2-D Echo 12/12/2016: vigorous LV function with EF 65-70%. GR 2 DD. No RWMA. Mild to mod AS (peak gradient 35 mmHg). Mod LA dilation. Mild MR, Mod TR. PA pressures ~35-40 mmHg.  . TUBAL LIGATION       reports that she has quit smoking. She has never used smokeless tobacco. She reports that she does not drink alcohol or use drugs.  Allergies  Allergen Reactions  . Valium Nausea And Vomiting    Family  History  Problem Relation Age of Onset  . Cancer Mother   . Heart disease Father   . Heart disease Maternal Grandmother   . Heart disease Maternal Grandfather   . Heart disease Brother   . Nephrolithiasis Brother        accidental death  . Heart Problems Brother        CABG    Prior to Admission medications   Medication Sig Start Date End Date Taking? Authorizing Provider  acetaminophen (TYLENOL) 500 MG tablet Take 1,000 mg by mouth 2 (two) times daily as needed (pain.).   Yes [provider]  atorvastatin (LIPITOR) 20 MG tablet Take 20 mg by mouth at bedtime.    Yes [provider]  benazepril (LOTENSIN) 40 MG tablet Take 1 tablet (40 mg total) by mouth daily. 11/22/19  Yes Marykay Lex, MD  carvedilol (COREG) 12.5 MG tablet Take 1.5 tablets (18.75 mg total) by mouth 2 (two) times daily. 11/22/19  Yes Marykay Lex, MD  diclofenac Sodium (VOLTAREN) 1 % GEL  07/25/19  Yes [provider]  furosemide (LASIX) 40 MG tablet Take 40 mg by mouth daily as needed for fluid.    Yes [provider]  hydrALAZINE (APRESOLINE) 100 MG tablet TAKE ONE TABLET BY MOUTH TWICE DAILY Patient taking differently: Take 100 mg by mouth 2 (two) times daily.  11/09/17  Yes Marykay Lex, MD  traMADol (ULTRAM) 50 MG tablet Take 50 mg by mouth every 6 (six) hours as needed for moderate pain.  05/30/15  Yes [provider]  NITROSTAT 0.4 MG SL tablet DISSOLVE 1 TAB UNDER TOUNGE FOR CHEST PAIN. MAY REPEAT EVERY 5 MINUTES FOR 3 DOSES. IF NO RELIEF CALL 911 OR GO TO ER Patient not taking: No sig reported 11/24/16   Marykay Lex, MD    Physical Exam: Vitals:   02/29/20 1424 02/29/20 1430 02/29/20 1500 02/29/20 1548  BP:  (!) 173/75 (!) 184/84   Pulse: 67 80 78 71  Resp: 16 13 13    Temp:    98.7 F (37.1 C)  TempSrc:    Oral  SpO2: 96% 96% 99% 99%  Weight:      Height:        Constitutional: NAD, calm, comfortable Vitals:   02/29/20 1424 02/29/20 1430  02/29/20 1500 02/29/20 1548  BP:  (!) 173/75 (!) 184/84   Pulse: 67 80 78 71  Resp: 16 13 13    Temp:    98.7 F (37.1 C)  TempSrc:    Oral  SpO2: 96% 96% 99% 99%  Weight:      Height:       Eyes: lids and conjunctivae normal ENMT: Mucous membranes are moist.  Neck: normal, supple Respiratory: clear to auscultation bilaterally. Normal respiratory effort. No accessory muscle use.  Cardiovascular: Regular rate  and rhythm, holosystolic murmur noted. No extremity edema. Abdomen: no tenderness, no distention. Bowel sounds positive.  Musculoskeletal:  No joint deformity upper and lower extremities.   Skin: no rashes, lesions, ulcers.  Psychiatric: Normal judgment and insight. Alert and oriented x 3. Normal mood.   Labs on Admission: I have personally reviewed following labs and imaging studies  CBC: Recent Labs  Lab 02/29/20 1248  WBC 4.2  HGB 13.2  HCT 39.6  MCV 102.3*  PLT 884   Basic Metabolic Panel: Recent Labs  Lab 02/29/20 1248  NA 141  K 3.7  CL 102  CO2 29  GLUCOSE 128*  BUN 14  CREATININE 0.58  CALCIUM 9.9   GFR: Estimated Creatinine Clearance: 50.8 mL/min (by C-G formula based on SCr of 0.58 mg/dL). Liver Function Tests: No results for input(s): AST, ALT, ALKPHOS, BILITOT, PROT, ALBUMIN in the last 168 hours. No results for input(s): LIPASE, AMYLASE in the last 168 hours. No results for input(s): AMMONIA in the last 168 hours. Coagulation Profile: No results for input(s): INR, PROTIME in the last 168 hours. Cardiac Enzymes: No results for input(s): CKTOTAL, CKMB, CKMBINDEX, TROPONINI in the last 168 hours. BNP (last 3 results) No results for input(s): PROBNP in the last 8760 hours. HbA1C: No results for input(s): HGBA1C in the last 72 hours. CBG: No results for input(s): GLUCAP in the last 168 hours. Lipid Profile: No results for input(s): CHOL, HDL, LDLCALC, TRIG, CHOLHDL, LDLDIRECT in the last 72 hours. Thyroid Function Tests: No results for  input(s): TSH, T4TOTAL, FREET4, T3FREE, THYROIDAB in the last 72 hours. Anemia Panel: No results for input(s): VITAMINB12, FOLATE, FERRITIN, TIBC, IRON, RETICCTPCT in the last 72 hours. Urine analysis:    Component Value Date/Time   COLORURINE STRAW (A) 09/03/2011 1901   APPEARANCEUR CLEAR 09/03/2011 1901   LABSPEC 1.009 09/03/2011 1901   PHURINE 7.0 09/03/2011 1901   GLUCOSEU 100 (A) 09/03/2011 1901   HGBUR NEGATIVE 09/03/2011 1901   BILIRUBINUR NEGATIVE 09/03/2011 1901   KETONESUR NEGATIVE 09/03/2011 1901   PROTEINUR NEGATIVE 09/03/2011 1901   UROBILINOGEN 0.2 09/03/2011 1901   NITRITE NEGATIVE 09/03/2011 1901   LEUKOCYTESUR NEGATIVE 09/03/2011 1901    Radiological Exams on Admission: DG Chest Port 1 View  Result Date: 02/29/2020 CLINICAL DATA:  Chest pain and pressure beginning last night. EXAM: PORTABLE CHEST 1 VIEW COMPARISON:  09/03/2011 FINDINGS: Heart size is normal. Chronic aortic atherosclerosis. Possible emphysema. No sign of active infiltrate, mass, effusion or collapse. No acute bone finding. IMPRESSION: No active disease. Possible emphysema. Aortic atherosclerosis. Electronically Signed   By: Nelson Chimes M.D.   On: 02/29/2020 12:42    EKG: Independently reviewed. SR 81bpm. No acute changes.  Assessment/Plan Active Problems:   Chest pain    Substernal chest pain -Currently improved, but ongoing and reminiscent of prior MI in 1994 -Heart score 7, and thus will observe overnight -Monitor repeat troponins in a.m. as well as EKG -2D echocardiogram for further evaluation to include aortic stenosis -Add Imdur to assist with chest pain and improve blood pressure  Hypertension-elevated -Continue Coreg and benazepril -Continue Lasix -Plan to add Imdur here to assist with angina  Type 2 diabetes -SSI and carb modified diet  Dyslipidemia -Continue statin  Chronic diastolic heart failure -Check 2D echocardiogram, appears euvolemic -Continue Lasix  Prior  history of aortic stenosis-moderate -Repeat 2D echocardiogram -Continue Coreg and Lotensin  Gastric ulcer -PPI   DVT prophylaxis: Lovenox Code Status: Full Family Communication: Will call son Disposition Plan:Observe for  possible ACS Consults called:None Admission status: Obs, tele   Glyn Zendejas D Courtney Bellizzi DO [facility]  If 7PM-7AM, please contact night-coverage [email or web address]   02/29/2020, 3:56 PM

## 2020-02-29 NOTE — ED Triage Notes (Signed)
EMS reports pt started having chest pressure at 3am this morning.  Pt has history of MI in 1994 and has 2 stents placed.  EMS gave 324mg  asa and 1nitro.  Reports no change in chest pressure.  Pt told ems this pressure feels the same way when she had her MI.

## 2020-03-01 ENCOUNTER — Observation Stay (HOSPITAL_COMMUNITY): Payer: Medicare Other

## 2020-03-01 DIAGNOSIS — I361 Nonrheumatic tricuspid (valve) insufficiency: Secondary | ICD-10-CM

## 2020-03-01 DIAGNOSIS — Z955 Presence of coronary angioplasty implant and graft: Secondary | ICD-10-CM | POA: Diagnosis not present

## 2020-03-01 DIAGNOSIS — E785 Hyperlipidemia, unspecified: Secondary | ICD-10-CM | POA: Diagnosis present

## 2020-03-01 DIAGNOSIS — Z87891 Personal history of nicotine dependence: Secondary | ICD-10-CM | POA: Diagnosis not present

## 2020-03-01 DIAGNOSIS — K219 Gastro-esophageal reflux disease without esophagitis: Secondary | ICD-10-CM | POA: Diagnosis present

## 2020-03-01 DIAGNOSIS — I350 Nonrheumatic aortic (valve) stenosis: Secondary | ICD-10-CM | POA: Diagnosis not present

## 2020-03-01 DIAGNOSIS — I340 Nonrheumatic mitral (valve) insufficiency: Secondary | ICD-10-CM | POA: Diagnosis not present

## 2020-03-01 DIAGNOSIS — Z79899 Other long term (current) drug therapy: Secondary | ICD-10-CM | POA: Diagnosis not present

## 2020-03-01 DIAGNOSIS — R079 Chest pain, unspecified: Secondary | ICD-10-CM | POA: Diagnosis not present

## 2020-03-01 DIAGNOSIS — Z8249 Family history of ischemic heart disease and other diseases of the circulatory system: Secondary | ICD-10-CM | POA: Diagnosis not present

## 2020-03-01 DIAGNOSIS — I110 Hypertensive heart disease with heart failure: Secondary | ICD-10-CM | POA: Diagnosis present

## 2020-03-01 DIAGNOSIS — I252 Old myocardial infarction: Secondary | ICD-10-CM | POA: Diagnosis not present

## 2020-03-01 DIAGNOSIS — N179 Acute kidney failure, unspecified: Secondary | ICD-10-CM | POA: Diagnosis present

## 2020-03-01 DIAGNOSIS — Z791 Long term (current) use of non-steroidal anti-inflammatories (NSAID): Secondary | ICD-10-CM | POA: Diagnosis not present

## 2020-03-01 DIAGNOSIS — Z90710 Acquired absence of both cervix and uterus: Secondary | ICD-10-CM | POA: Diagnosis not present

## 2020-03-01 DIAGNOSIS — E119 Type 2 diabetes mellitus without complications: Secondary | ICD-10-CM | POA: Diagnosis present

## 2020-03-01 DIAGNOSIS — Z20822 Contact with and (suspected) exposure to covid-19: Secondary | ICD-10-CM | POA: Diagnosis present

## 2020-03-01 DIAGNOSIS — I5032 Chronic diastolic (congestive) heart failure: Secondary | ICD-10-CM | POA: Diagnosis present

## 2020-03-01 DIAGNOSIS — E7800 Pure hypercholesterolemia, unspecified: Secondary | ICD-10-CM | POA: Diagnosis present

## 2020-03-01 DIAGNOSIS — K259 Gastric ulcer, unspecified as acute or chronic, without hemorrhage or perforation: Secondary | ICD-10-CM | POA: Diagnosis present

## 2020-03-01 DIAGNOSIS — I25110 Atherosclerotic heart disease of native coronary artery with unstable angina pectoris: Secondary | ICD-10-CM | POA: Diagnosis present

## 2020-03-01 HISTORY — PX: TRANSTHORACIC ECHOCARDIOGRAM: SHX275

## 2020-03-01 LAB — URINALYSIS, ROUTINE W REFLEX MICROSCOPIC
Bacteria, UA: NONE SEEN
Bilirubin Urine: NEGATIVE
Glucose, UA: NEGATIVE mg/dL
Hgb urine dipstick: NEGATIVE
Ketones, ur: NEGATIVE mg/dL
Nitrite: NEGATIVE
Protein, ur: 30 mg/dL — AB
Specific Gravity, Urine: 1.014 (ref 1.005–1.030)
pH: 5 (ref 5.0–8.0)

## 2020-03-01 LAB — CREATININE, URINE, RANDOM: Creatinine, Urine: 246.92 mg/dL

## 2020-03-01 LAB — SODIUM, URINE, RANDOM: Sodium, Ur: 13 mmol/L

## 2020-03-01 LAB — BASIC METABOLIC PANEL
Anion gap: 10 (ref 5–15)
BUN: 21 mg/dL (ref 8–23)
CO2: 28 mmol/L (ref 22–32)
Calcium: 9 mg/dL (ref 8.9–10.3)
Chloride: 100 mmol/L (ref 98–111)
Creatinine, Ser: 1.43 mg/dL — ABNORMAL HIGH (ref 0.44–1.00)
GFR calc Af Amer: 39 mL/min — ABNORMAL LOW (ref >60)
GFR calc non Af Amer: 34 mL/min — ABNORMAL LOW (ref >60)
Glucose, Bld: 116 mg/dL — ABNORMAL HIGH (ref 70–99)
Potassium: 3.6 mmol/L (ref 3.5–5.1)
Sodium: 138 mmol/L (ref 135–145)

## 2020-03-01 LAB — GLUCOSE, CAPILLARY
Glucose-Capillary: 100 mg/dL — ABNORMAL HIGH (ref 70–99)
Glucose-Capillary: 120 mg/dL — ABNORMAL HIGH (ref 70–99)
Glucose-Capillary: 93 mg/dL (ref 70–99)
Glucose-Capillary: 122 mg/dL — ABNORMAL HIGH (ref 70–99)

## 2020-03-01 LAB — TROPONIN I (HIGH SENSITIVITY)
Troponin I (High Sensitivity): 16 ng/L (ref <18)
Troponin I (High Sensitivity): 17 ng/L (ref <18)

## 2020-03-01 LAB — ECHOCARDIOGRAM COMPLETE
Height: 66 in
Weight: 2051.2 oz

## 2020-03-01 MED ORDER — HEPARIN SODIUM (PORCINE) 5000 UNIT/ML IJ SOLN
5000.0000 [IU] | Freq: Three times a day (TID) | INTRAMUSCULAR | Status: DC
  Administered 2020-03-01 – 2020-03-02 (×3): 5000 [IU] via SUBCUTANEOUS
  Filled 2020-03-01 (×3): qty 1

## 2020-03-01 MED ORDER — HYDRALAZINE HCL 25 MG PO TABS
50.0000 mg | ORAL_TABLET | Freq: Two times a day (BID) | ORAL | Status: DC
  Administered 2020-03-01: 50 mg via ORAL
  Filled 2020-03-01: qty 2

## 2020-03-01 MED ORDER — LACTATED RINGERS IV SOLN: INTRAVENOUS | Status: AC

## 2020-03-01 MED ORDER — HYDRALAZINE HCL 25 MG PO TABS
100.0000 mg | ORAL_TABLET | Freq: Two times a day (BID) | ORAL | Status: DC
  Administered 2020-03-01 – 2020-03-02 (×2): 100 mg via ORAL
  Filled 2020-03-01 (×2): qty 4

## 2020-03-01 NOTE — Progress Notes (Signed)
PROGRESS NOTE    [name]  LNL:[id number] DOB: 1936/03/10 DOA: 02/29/2020 PCP: Manon Hilding, MD   Brief Narrative:  Per HPI: [name] is a 84 y.o. female with medical history significant for CAD with prior stent to right coronary artery, hypertension, chronic diastolic heart failure, dyslipidemia, type 2 diabetes, moderate aortic stenosis, gastric ulcer, and chronic lower extremity edema, who presented to the ED with complaints of substernal chest pressure that began at approximately 3 AM and awoke her from sleep.  She denies any radiation of pain and rated the pain level 5/10.  She denies any diaphoresis, nausea, or vomiting issues.  She denies any shortness of breath or palpitations as well.  No fevers, chills, or cough noted.  She arrived to the ED via EMS and was given full dose aspirin and 1 of nitro with no significant change to her chest pressure.  She is feeling somewhat better now several hours later.  3/11: Patient has had no further chest pain, but unfortunately her creatinine is bumped up to 1.43 today.  We will start IV fluid and evaluation as well.  Repeat labs in a.m. and hold nephrotoxic agents.  Renal consultation by a.m. as needed.  2D echocardiogram pending.  Assessment & Plan:   Active Problems:   Chest pain   Substernal chest pain -Currently improved, but ongoing and reminiscent of prior MI in 1994 -EKG and troponins unremarkable -2D echocardiogram for further evaluation to include aortic stenosis, currently pending -Add Imdur to assist with chest pain and improve blood pressure  AKI-likely prerenal -IV fluid and hold nephrotoxic agents -Urine electrolytes and renal ultrasound ordered -Repeat bmet in a.m.  Hypertension-elevated -Continue Coreg and hold Lasix and benazepril given AKI -Plan to add Imdur here to assist with angina  Type 2 diabetes -Hemoglobin A1c 5.1% with SSI discontinued as well as carb modified  diet  Dyslipidemia -Continue statin  Chronic diastolic heart failure -Check 2D echocardiogram, appears euvolemic -Continue Lasix  Prior history of aortic stenosis-moderate -Repeat 2D echocardiogram -Continue Coreg and Lotensin  Gastric ulcer -PPI   DVT prophylaxis: Lovenox to heparin Code Status: Full Family Communication: Called son, Annie Main Disposition Plan: AKI evaluation ongoing as well as 2D echocardiogram pending.  Anticipate discharge in a.m. if improved.   Consultants:   None  Procedures:   See below  Antimicrobials:   None   Subjective: Patient seen and evaluated today with no new acute complaints or concerns. No acute concerns or events noted overnight.  She has no further chest pain, but unfortunately creatinine is elevated.  Denies hematuria or dark urine.  Objective: Vitals:   02/29/20 2100 02/29/20 2135 03/01/20 0539 03/01/20 0925  BP:  (!) 128/59 (!) 102/44 (!) 159/63  Pulse:  71 65 72  Resp:  16 18   Temp:  98.6 F (37 C) 98.2 F (36.8 C)   TempSrc:  Oral    SpO2: 98% 99% 97%   Weight:   58.2 kg   Height:        Intake/Output Summary (Last 24 hours) at 03/01/2020 1229 Last data filed at 03/01/2020 0928 Gross per 24 hour  Intake 243 ml  Output --  Net 243 ml   Filed Weights   02/29/20 1218 03/01/20 0539  Weight: 63.5 kg 58.2 kg    Examination:  General exam: Appears calm and comfortable  Respiratory system: Clear to auscultation. Respiratory effort normal. Cardiovascular system: S1 & S2 heard, RRR. No JVD, murmurs, rubs, gallops or clicks. No  pedal edema. Gastrointestinal system: Abdomen is nondistended, soft and nontender. No organomegaly or masses felt. Normal bowel sounds heard. Central nervous system: Alert and oriented. No focal neurological deficits. Extremities: Symmetric 5 x 5 power. Skin: No rashes, lesions or ulcers Psychiatry: Judgement and insight appear normal. Mood & affect appropriate.     Data Reviewed: I  have personally reviewed following labs and imaging studies  CBC: Recent Labs  Lab 02/29/20 1248  WBC 4.2  HGB 13.2  HCT 39.6  MCV 102.3*  PLT 208   Basic Metabolic Panel: Recent Labs  Lab 02/29/20 1248 03/01/20 0316  NA 141 138  K 3.7 3.6  CL 102 100  CO2 29 28  GLUCOSE 128* 116*  BUN 14 21  CREATININE 0.58 1.43*  CALCIUM 9.9 9.0   GFR: Estimated Creatinine Clearance: 27.9 mL/min (A) (by C-G formula based on SCr of 1.43 mg/dL (H)). Liver Function Tests: No results for input(s): AST, ALT, ALKPHOS, BILITOT, PROT, ALBUMIN in the last 168 hours. No results for input(s): LIPASE, AMYLASE in the last 168 hours. No results for input(s): AMMONIA in the last 168 hours. Coagulation Profile: No results for input(s): INR, PROTIME in the last 168 hours. Cardiac Enzymes: No results for input(s): CKTOTAL, CKMB, CKMBINDEX, TROPONINI in the last 168 hours. BNP (last 3 results) No results for input(s): PROBNP in the last 8760 hours. HbA1C: Recent Labs    02/29/20 1248  HGBA1C 5.1   CBG: Recent Labs  Lab 02/29/20 1651 02/29/20 2140 03/01/20 0808 03/01/20 1114  GLUCAP 106* 94 100* 120*   Lipid Profile: No results for input(s): CHOL, HDL, LDLCALC, TRIG, CHOLHDL, LDLDIRECT in the last 72 hours. Thyroid Function Tests: No results for input(s): TSH, T4TOTAL, FREET4, T3FREE, THYROIDAB in the last 72 hours. Anemia Panel: No results for input(s): VITAMINB12, FOLATE, FERRITIN, TIBC, IRON, RETICCTPCT in the last 72 hours. Sepsis Labs: No results for input(s): PROCALCITON, LATICACIDVEN in the last 168 hours.  Recent Results (from the past 240 hour(s))  Respiratory Panel by RT PCR (Flu A&B, Covid) - Nasopharyngeal Swab     Status: None   Collection Time: 02/29/20  2:31 PM   Specimen: Nasopharyngeal Swab  Result Value Ref Range Status   SARS Coronavirus 2 by RT PCR NEGATIVE NEGATIVE Final    Comment: (NOTE) SARS-CoV-2 target nucleic acids are NOT DETECTED. The SARS-CoV-2 RNA is  generally detectable in upper respiratoy specimens during the acute phase of infection. The lowest concentration of SARS-CoV-2 viral copies this assay can detect is 131 copies/mL. A negative result does not preclude SARS-Cov-2 infection and should not be used as the sole basis for treatment or other patient management decisions. A negative result may occur with  improper specimen collection/handling, submission of specimen other than nasopharyngeal swab, presence of viral mutation(s) within the areas targeted by this assay, and inadequate number of viral copies (<131 copies/mL). A negative result must be combined with clinical observations, patient history, and epidemiological information. The expected result is Negative. Fact Sheet for Patients:  https://www.moore.com/ Fact Sheet for Healthcare Providers:  https://www.young.biz/ This test is not yet ap proved or cleared by the [place] FDA and  has been authorized for detection and/or diagnosis of SARS-CoV-2 by FDA under an Emergency Use Authorization (EUA). This EUA will remain  in effect (meaning this test can be used) for the duration of the COVID-19 declaration under Section 564(b)(1) of the Act, 21 U.S.C. section 360bbb-3(b)(1), unless the authorization is terminated or revoked sooner.    Influenza A  by PCR NEGATIVE NEGATIVE Final   Influenza B by PCR NEGATIVE NEGATIVE Final    Comment: (NOTE) The Xpert Xpress SARS-CoV-2/FLU/RSV assay is intended as an aid in  the diagnosis of influenza from Nasopharyngeal swab specimens and  should not be used as a sole basis for treatment. Nasal washings and  aspirates are unacceptable for Xpert Xpress SARS-CoV-2/FLU/RSV  testing. Fact Sheet for Patients: https://www.moore.com/ Fact Sheet for Healthcare Providers: https://www.young.biz/ This test is not yet approved or cleared by the [place] FDA and   has been authorized for detection and/or diagnosis of SARS-CoV-2 by  FDA under an Emergency Use Authorization (EUA). This EUA will remain  in effect (meaning this test can be used) for the duration of the  Covid-19 declaration under Section 564(b)(1) of the Act, 21  U.S.C. section 360bbb-3(b)(1), unless the authorization is  terminated or revoked. Performed at [facility], [address], [place], [place] [address]          Radiology Studies: [facility] 1 View  Result Date: 02/29/2020 CLINICAL DATA:  Chest pain and pressure beginning last night. EXAM: PORTABLE CHEST 1 VIEW COMPARISON:  09/03/2011 FINDINGS: Heart size is normal. Chronic aortic atherosclerosis. Possible emphysema. No sign of active infiltrate, mass, effusion or collapse. No acute bone finding. IMPRESSION: No active disease. Possible emphysema. Aortic atherosclerosis. Electronically Signed   By: Paulina Fusi M.D.   On: 02/29/2020 12:42        Scheduled Meds: . atorvastatin  20 mg Oral QHS  . carvedilol  18.75 mg Oral BID  . hydrALAZINE  100 mg Oral BID  . isosorbide mononitrate  15 mg Oral Daily  . pantoprazole  40 mg Oral Daily  . sodium chloride flush  3 mL Intravenous Q12H   Continuous Infusions: . sodium chloride    . lactated ringers 75 mL/hr at 03/01/20 0847     LOS: 0 days    Time spent: 35 minutes    Dontavia Brand Hoover Brunette, DO [facility]  If 7PM-7AM, please contact night-coverage [email or web address] 03/01/2020, 12:29 PM

## 2020-03-01 NOTE — Plan of Care (Signed)
  Problem: Education: Goal: Knowledge of General Education information will improve Description: Including pain rating scale, medication(s)/side effects and non-pharmacologic comfort measures Outcome: Progressing   Problem: Safety: Goal: Ability to remain free from injury will improve Outcome: Progressing   Problem: Skin Integrity: Goal: Risk for impaired skin integrity will decrease Outcome: Progressing   Problem: Education: Goal: Knowledge of General Education information will improve Description: Including pain rating scale, medication(s)/side effects and non-pharmacologic comfort measures Outcome: Progressing   Problem: Activity: Goal: Risk for activity intolerance will decrease Outcome: Progressing   Problem: Nutrition: Goal: Adequate nutrition will be maintained Outcome: Progressing   Problem: Elimination: Goal: Will not experience complications related to bowel motility Outcome: Progressing   Problem: Pain Managment: Goal: General experience of comfort will improve Outcome: Progressing   Problem: Safety: Goal: Ability to remain free from injury will improve Outcome: Progressing   Problem: Skin Integrity: Goal: Risk for impaired skin integrity will decrease Outcome: Progressing

## 2020-03-01 NOTE — Progress Notes (Signed)
[name] from [address] called and said she noted a 5-beat run of wide-QRS VTach around 1200.  [name] stated she would put this strip in the patient's chart.

## 2020-03-01 NOTE — Progress Notes (Signed)
*  PRELIMINARY RESULTS* Echocardiogram 2D Echocardiogram has been performed.  [name] 03/01/2020, 11:21 AM

## 2020-03-02 DIAGNOSIS — N179 Acute kidney failure, unspecified: Principal | ICD-10-CM

## 2020-03-02 LAB — BASIC METABOLIC PANEL
Anion gap: 11 (ref 5–15)
BUN: 27 mg/dL — ABNORMAL HIGH (ref 8–23)
CO2: 28 mmol/L (ref 22–32)
Calcium: 9.2 mg/dL (ref 8.9–10.3)
Chloride: 101 mmol/L (ref 98–111)
Creatinine, Ser: 0.92 mg/dL (ref 0.44–1.00)
GFR calc Af Amer: >60 mL/min (ref >60)
GFR calc non Af Amer: 58 mL/min — ABNORMAL LOW (ref >60)
Glucose, Bld: 110 mg/dL — ABNORMAL HIGH (ref 70–99)
Potassium: 3.3 mmol/L — ABNORMAL LOW (ref 3.5–5.1)
Sodium: 140 mmol/L (ref 135–145)

## 2020-03-02 LAB — GLUCOSE, CAPILLARY: Glucose-Capillary: 108 mg/dL — ABNORMAL HIGH (ref 70–99)

## 2020-03-02 MED ORDER — ISOSORBIDE MONONITRATE ER 30 MG PO TB24: 15.0000 mg | ORAL_TABLET | Freq: Every day | ORAL | 2 refills | Status: DC

## 2020-03-02 MED ORDER — POTASSIUM CHLORIDE CRYS ER 20 MEQ PO TBCR
40.0000 meq | EXTENDED_RELEASE_TABLET | Freq: Once | ORAL | Status: AC
  Administered 2020-03-02: 40 meq via ORAL
  Filled 2020-03-02: qty 2

## 2020-03-02 NOTE — Discharge Summary (Signed)
Physician Discharge Summary  [name] NWG:[id number] DOB: 11/07/1936 DOA: 02/29/2020  PCP: Estanislado PandySasser, Paul W, MD  Admit date: 02/29/2020  Discharge date: 03/02/2020  Admitted From:Home  Disposition:  Home  Recommendations for Outpatient Follow-up:  1. Follow up with PCP in 1-2 weeks and repeat BMET 2. Follow-up with cardiology Dr. Herbie BaltimoreHarding in 4 weeks 3. Continue on Imdur 15 mg daily as prescribed and monitor home blood pressure readings 4. Continue other home medications as prior  Home Health: None  Equipment/Devices: None  Discharge Condition: Stable  CODE STATUS: Full  Diet recommendation: Heart Healthy  Brief/Interim Summary: Per HPI: April ForestShirley A Holcombis a 84 y.o.femalewith medical history significant forCAD with prior stent to right coronary artery, hypertension, chronic diastolic heart failure, dyslipidemia, type 2 diabetes, moderate aortic stenosis, gastric ulcer, and chronic lower extremity edema, who presented to the ED with complaints of substernal chest pressure that began at approximately 3 AM and awoke her from sleep. She denies any radiation of pain and rated the pain level 5/10. She denies any diaphoresis, nausea, or vomiting issues. She denies any shortness of breath or palpitations as well. No fevers,chills,or cough noted. She arrived to the ED via EMS and was given full dose aspirin and 1 of nitro with no significant change to her chest pressure. She is feeling somewhat better now several hours later.  3/11: Patient has had no further chest pain, but unfortunately her creatinine is bumped up to 1.43 today.  We will start IV fluid and evaluation as well.  Repeat labs in a.m. and hold nephrotoxic agents.  Renal consultation by a.m. as needed.  2D echocardiogram pending.  3/12: Patient stable for discharge today as creatinine is 0.92.  2D echocardiogram with LVEF 70-75% and grade 2 diastolic dysfunction as well as moderate aortic stenosis.  Renal  ultrasound with no acute findings and laboratory data indicates prerenal AKI which has now resolved.  Her blood pressures have been better controlled with use of Imdur on top of her usual medicines which will not be continued.  She will need to have her BMP followed in the office to ensure stable renal function in the next 1 week.  She has otherwise been going through a lot of stress with her husband's health which has led to elevated blood pressure readings.  No other acute events noted throughout the course of this admission.  She is overall stable for discharge otherwise.  Discharge Diagnoses:  Active Problems:   Chest pain   AKI (acute kidney injury) (HCC)  Principal discharge diagnosis: Angina related to elevated blood pressure readings-resolved.  Discharge Instructions  Discharge Instructions    Diet - low sodium heart healthy   Complete by: As directed    Increase activity slowly   Complete by: As directed      Allergies as of 03/02/2020      Reactions   Valium Nausea And Vomiting      Medication List    TAKE these medications   acetaminophen 500 MG tablet Commonly known as: TYLENOL Take 1,000 mg by mouth 2 (two) times daily as needed (pain.).   atorvastatin 20 MG tablet Commonly known as: LIPITOR Take 20 mg by mouth at bedtime.   benazepril 40 MG tablet Commonly known as: LOTENSIN Take 1 tablet (40 mg total) by mouth daily.   carvedilol 12.5 MG tablet Commonly known as: COREG Take 1.5 tablets (18.75 mg total) by mouth 2 (two) times daily.   diclofenac Sodium 1 % Gel Commonly known as:  VOLTAREN   furosemide 40 MG tablet Commonly known as: LASIX Take 40 mg by mouth daily as needed for fluid.   hydrALAZINE 100 MG tablet Commonly known as: APRESOLINE TAKE ONE TABLET BY MOUTH TWICE DAILY   isosorbide mononitrate 30 MG 24 hr tablet Commonly known as: IMDUR Take 0.5 tablets (15 mg total) by mouth daily.   Nitrostat 0.4 MG SL tablet Generic drug:  nitroGLYCERIN DISSOLVE 1 TAB UNDER TOUNGE FOR CHEST PAIN. MAY REPEAT EVERY 5 MINUTES FOR 3 DOSES. IF NO RELIEF CALL 911 OR GO TO ER   traMADol 50 MG tablet Commonly known as: ULTRAM Take 50 mg by mouth every 6 (six) hours as needed for moderate pain.      Follow-up Information    Sasser, Clarene Critchley, MD Follow up in 1 week(s).   Specialty: Family Medicine Contact information: [address] [place] [address] [phone]        Marykay Lex, MD Follow up in 4 week(s).   Specialty: Cardiology Contact information: [address] [place] [address] [phone]          Allergies  Allergen Reactions  . Valium Nausea And Vomiting    Consultations:  None   Procedures/Studies: US RENAL  Result Date: 03/01/2020 CLINICAL DATA:  Acute kidney injury. EXAM: RENAL / URINARY TRACT ULTRASOUND COMPLETE COMPARISON:  None. FINDINGS: Right Kidney: Renal measurements: 10.5 x 5.4 x 4.6 cm = volume: 138 mL. Echogenicity within normal limits. No mass or hydronephrosis visualized. Left Kidney: Renal measurements: 10.2 x 5.5 x 5.6 cm = volume: 170 mL. Echogenicity within normal limits. No mass or hydronephrosis visualized. Bladder: Appears normal for degree of bladder distention. Both ureteral jets are visualized. Other: None. IMPRESSION: Unremarkable renal ultrasound.  No obstructive uropathy. Electronically Signed   By: Narda Rutherford M.D.   On: 03/01/2020 14:45   DG Chest Port 1 View  Result Date: 02/29/2020 CLINICAL DATA:  Chest pain and pressure beginning last night. EXAM: PORTABLE CHEST 1 VIEW COMPARISON:  09/03/2011 FINDINGS: Heart size is normal. Chronic aortic atherosclerosis. Possible emphysema. No sign of active infiltrate, mass, effusion or collapse. No acute bone finding. IMPRESSION: No active disease. Possible emphysema. Aortic atherosclerosis. Electronically Signed   By: Paulina Fusi M.D.   On: 02/29/2020 12:42   ECHOCARDIOGRAM COMPLETE  Result Date:  03/01/2020    ECHOCARDIOGRAM REPORT   Patient Name:   [name] Date of Exam: 03/01/2020 Medical Rec #:  [id number]         Height:       66.0 in Accession #:    [id number]        Weight:       128.2 lb Date of Birth:  [birth year]         BSA:          1.655 m Patient Age:    84 years          BP:           159/63 mmHg Patient Gender: F                 HR:           82 bpm. [facility] Procedure: 2D Echo, Cardiac Doppler and Color Doppler Indications:    Chest Pain 786.50 / R07.9  History:        Patient has prior history of Echocardiogram examinations, most  recent 11/09/2018. Previous Myocardial Infarction, Aortic Valve                 Disease; Risk Factors:Hypertension, Diabetes and Dyslipidemia.                 Presence of stent in right coronary artery, HSRA for ISR in                 2012,GERD (gastroesophageal reflux disease).  Sonographer:    Jeryl Columbia RDCS (AE) Referring Phys: [phone] [facility] IMPRESSIONS  1. Left ventricular ejection fraction, by estimation, is 70 to 75%. The left ventricle has hyperdynamic function. The left ventricle has no regional wall motion abnormalities. There is mild left ventricular hypertrophy. Left ventricular diastolic parameters are consistent with Grade II diastolic dysfunction (pseudonormalization).  2. Right ventricular systolic function is normal. The right ventricular size is normal. There is mildly elevated pulmonary artery systolic pressure. The estimated right ventricular systolic pressure is 36.9 mmHg.  3. Left atrial size was severely dilated.  4. The mitral valve is grossly normal. Mild mitral valve regurgitation.  5. The aortic valve is tricuspid. Aortic valve regurgitation is not visualized. Moderate to severe aortic valve stenosis. Aortic valve mean gradient measures 17.3 mmHg. Aortic valve Vmax measures 2.69 m/s. Dimentionless index 0.32.  6. The inferior vena cava is normal in size with greater than 50% respiratory  variability, suggesting right atrial pressure of 3 mmHg. FINDINGS  Left Ventricle: Left ventricular ejection fraction, by estimation, is 70 to 75%. The left ventricle has hyperdynamic function. The left ventricle has no regional wall motion abnormalities. The left ventricular internal cavity size was normal in size. There is mild left ventricular hypertrophy. Left ventricular diastolic parameters are consistent with Grade II diastolic dysfunction (pseudonormalization). Right Ventricle: The right ventricular size is normal. No increase in right ventricular wall thickness. Right ventricular systolic function is normal. There is mildly elevated pulmonary artery systolic pressure. The tricuspid regurgitant velocity is 2.91  m/s, and with an assumed right atrial pressure of 3 mmHg, the estimated right ventricular systolic pressure is 36.9 mmHg. Left Atrium: Left atrial size was severely dilated. Right Atrium: Right atrial size was normal in size. Pericardium: There is no evidence of pericardial effusion. Mitral Valve: The mitral valve is grossly normal. There is mild calcification of the mitral valve leaflet(s). Mild to moderate mitral annular calcification. Mild mitral valve regurgitation. Tricuspid Valve: The tricuspid valve is grossly normal. Tricuspid valve regurgitation is mild. Aortic Valve: The aortic valve is tricuspid. Aortic valve regurgitation is not visualized. Moderate to severe aortic stenosis is present. Mild aortic valve annular calcification. There is moderate calcification of the aortic valve. Aortic valve mean gradient measures 17.3 mmHg. Aortic valve peak gradient measures 28.9 mmHg. Aortic valve area, by VTI measures 0.83 cm. Pulmonic Valve: The pulmonic valve was not well visualized. Pulmonic valve regurgitation is not visualized. Aorta: The aortic root is normal in size and structure. Venous: The inferior vena cava is normal in size with greater than 50% respiratory variability, suggesting right  atrial pressure of 3 mmHg. IAS/Shunts: No atrial level shunt detected by color flow Doppler.  LEFT VENTRICLE PLAX 2D LVIDd:         3.35 cm  Diastology LVIDs:         1.90 cm  LV e' lateral:   6.31 cm/s LV PW:         1.36 cm  LV E/e' lateral: 17.7 LV IVS:  1.15 cm  LV e' medial:    4.68 cm/s LVOT diam:     1.80 cm  LV E/e' medial:  23.9 LV SV:         49 LV SV Index:   30 LVOT Area:     2.54 cm  RIGHT VENTRICLE RV S prime:     22.50 cm/s TAPSE (M-mode): 2.7 cm LEFT ATRIUM             Index       RIGHT ATRIUM           Index LA diam:        4.60 cm 2.78 cm/m  RA Area:     14.50 cm LA Vol (A2C):   58.9 ml 35.58 ml/m RA Volume:   32.60 ml  19.69 ml/m LA Vol (A4C):   93.5 ml 56.48 ml/m LA Biplane Vol: 79.9 ml 48.27 ml/m  AORTIC VALVE AV Area (Vmax):    0.96 cm AV Area (Vmean):   0.79 cm AV Area (VTI):     0.83 cm AV Vmax:           269.00 cm/s AV Vmean:          193.000 cm/s AV VTI:            0.592 m AV Peak Grad:      28.9 mmHg AV Mean Grad:      17.3 mmHg LVOT Vmax:         101.97 cm/s LVOT Vmean:        60.133 cm/s LVOT VTI:          0.192 m LVOT/AV VTI ratio: 0.32  AORTA Ao Root diam: 2.40 cm MITRAL VALVE                TRICUSPID VALVE MV Area (PHT): 3.53 cm     TR Peak grad:   33.9 mmHg MV Decel Time: 215 msec     TR Vmax:        291.00 cm/s MV E velocity: 112.00 cm/s MV A velocity: 102.00 cm/s  SHUNTS MV E/A ratio:  1.10         Systemic VTI:  0.19 m                             Systemic Diam: 1.80 cm Nona Dell MD Electronically signed by Nona Dell MD Signature Date/Time: 03/01/2020/12:41:02 PM    Final      Discharge Exam: Vitals:   03/01/20 2102 03/02/20 0522  BP: 137/61 (!) 141/53  Pulse: 61 61  Resp: 16 18  Temp: 98.2 F (36.8 C) 98 F (36.7 C)  SpO2: 99% 99%   Vitals:   03/01/20 0925 03/01/20 1517 03/01/20 2102 03/02/20 0522  BP: (!) 159/63 (!) 119/59 137/61 (!) 141/53  Pulse: 72 76 61 61  Resp:  19 16 18   Temp:  98.4 F (36.9 C) 98.2 F (36.8 C) 98 F (36.7  C)  TempSrc:  Oral Oral Oral  SpO2:  99% 99% 99%  Weight:      Height:        General: Pt is alert, awake, not in acute distress Cardiovascular: RRR, S1/S2 +, no rubs, no gallops Respiratory: CTA bilaterally, no wheezing, no rhonchi Abdominal: Soft, NT, ND, bowel sounds + Extremities: no edema, no cyanosis    The results of significant diagnostics from this hospitalization (including imaging, microbiology, ancillary and laboratory) are listed below  for reference.     Microbiology: Recent Results (from the past 240 hour(s))  Respiratory Panel by RT PCR (Flu A&B, Covid) - Nasopharyngeal Swab     Status: None   Collection Time: 02/29/20  2:31 PM   Specimen: Nasopharyngeal Swab  Result Value Ref Range Status   SARS Coronavirus 2 by RT PCR NEGATIVE NEGATIVE Final    Comment: (NOTE) SARS-CoV-2 target nucleic acids are NOT DETECTED. The SARS-CoV-2 RNA is generally detectable in upper respiratoy specimens during the acute phase of infection. The lowest concentration of SARS-CoV-2 viral copies this assay can detect is 131 copies/mL. A negative result does not preclude SARS-Cov-2 infection and should not be used as the sole basis for treatment or other patient management decisions. A negative result may occur with  improper specimen collection/handling, submission of specimen other than nasopharyngeal swab, presence of viral mutation(s) within the areas targeted by this assay, and inadequate number of viral copies (<131 copies/mL). A negative result must be combined with clinical observations, patient history, and epidemiological information. The expected result is Negative. Fact Sheet for Patients:  https://www.moore.com/ Fact Sheet for Healthcare Providers:  https://www.young.biz/ This test is not yet ap proved or cleared by the [place] FDA and  has been authorized for detection and/or diagnosis of SARS-CoV-2 by FDA under an Emergency  Use Authorization (EUA). This EUA will remain  in effect (meaning this test can be used) for the duration of the COVID-19 declaration under Section 564(b)(1) of the Act, 21 U.S.C. section 360bbb-3(b)(1), unless the authorization is terminated or revoked sooner.    Influenza A by PCR NEGATIVE NEGATIVE Final   Influenza B by PCR NEGATIVE NEGATIVE Final    Comment: (NOTE) The Xpert Xpress SARS-CoV-2/FLU/RSV assay is intended as an aid in  the diagnosis of influenza from Nasopharyngeal swab specimens and  should not be used as a sole basis for treatment. Nasal washings and  aspirates are unacceptable for Xpert Xpress SARS-CoV-2/FLU/RSV  testing. Fact Sheet for Patients: https://www.moore.com/ Fact Sheet for Healthcare Providers: https://www.young.biz/ This test is not yet approved or cleared by the [place] FDA and  has been authorized for detection and/or diagnosis of SARS-CoV-2 by  FDA under an Emergency Use Authorization (EUA). This EUA will remain  in effect (meaning this test can be used) for the duration of the  Covid-19 declaration under Section 564(b)(1) of the Act, 21  U.S.C. section 360bbb-3(b)(1), unless the authorization is  terminated or revoked. Performed at [facility], [address] [place], [place] [address]      Labs: BNP (last 3 results) No results for input(s): BNP in the last 8760 hours. Basic Metabolic Panel: Recent Labs  Lab 02/29/20 1248 03/01/20 0316 03/02/20 0424  NA 141 138 140  K 3.7 3.6 3.3*  CL 102 100 101  CO2 29 28 28   GLUCOSE 128* 116* 110*  BUN 14 21 27*  CREATININE 0.58 1.43* 0.92  CALCIUM 9.9 9.0 9.2   Liver Function Tests: No results for input(s): AST, ALT, ALKPHOS, BILITOT, PROT, ALBUMIN in the last 168 hours. No results for input(s): LIPASE, AMYLASE in the last 168 hours. No results for input(s): AMMONIA in the last 168 hours. CBC: Recent Labs  Lab 02/29/20 1248  WBC 4.2  HGB 13.2   HCT 39.6  MCV 102.3*  PLT 208   Cardiac Enzymes: No results for input(s): CKTOTAL, CKMB, CKMBINDEX, TROPONINI in the last 168 hours. BNP: Invalid input(s): POCBNP CBG: Recent Labs  Lab 03/01/20 0808 03/01/20 1114 03/01/20  1654 03/01/20 2103 03/02/20 0742  GLUCAP 100* 120* 93 122* 108*   D-Dimer No results for input(s): DDIMER in the last 72 hours. Hgb A1c Recent Labs    02/29/20 1248  HGBA1C 5.1   Lipid Profile No results for input(s): CHOL, HDL, LDLCALC, TRIG, CHOLHDL, LDLDIRECT in the last 72 hours. Thyroid function studies No results for input(s): TSH, T4TOTAL, T3FREE, THYROIDAB in the last 72 hours.  Invalid input(s): FREET3 Anemia work up No results for input(s): VITAMINB12, FOLATE, FERRITIN, TIBC, IRON, RETICCTPCT in the last 72 hours. Urinalysis    Component Value Date/Time   COLORURINE YELLOW 03/01/2020 1422   APPEARANCEUR HAZY (A) 03/01/2020 1422   LABSPEC 1.014 03/01/2020 1422   PHURINE 5.0 03/01/2020 1422   GLUCOSEU NEGATIVE 03/01/2020 1422   HGBUR NEGATIVE 03/01/2020 1422   BILIRUBINUR NEGATIVE 03/01/2020 1422   KETONESUR NEGATIVE 03/01/2020 1422   PROTEINUR 30 (A) 03/01/2020 1422   UROBILINOGEN 0.2 09/03/2011 1901   NITRITE NEGATIVE 03/01/2020 1422   LEUKOCYTESUR TRACE (A) 03/01/2020 1422   Sepsis Labs Invalid input(s): PROCALCITONIN,  WBC,  LACTICIDVEN Microbiology Recent Results (from the past 240 hour(s))  Respiratory Panel by RT PCR (Flu A&B, Covid) - Nasopharyngeal Swab     Status: None   Collection Time: 02/29/20  2:31 PM   Specimen: Nasopharyngeal Swab  Result Value Ref Range Status   SARS Coronavirus 2 by RT PCR NEGATIVE NEGATIVE Final    Comment: (NOTE) SARS-CoV-2 target nucleic acids are NOT DETECTED. The SARS-CoV-2 RNA is generally detectable in upper respiratoy specimens during the acute phase of infection. The lowest concentration of SARS-CoV-2 viral copies this assay can detect is 131 copies/mL. A negative result does not  preclude SARS-Cov-2 infection and should not be used as the sole basis for treatment or other patient management decisions. A negative result may occur with  improper specimen collection/handling, submission of specimen other than nasopharyngeal swab, presence of viral mutation(s) within the areas targeted by this assay, and inadequate number of viral copies (<131 copies/mL). A negative result must be combined with clinical observations, patient history, and epidemiological information. The expected result is Negative. Fact Sheet for Patients:  PinkCheek.be Fact Sheet for Healthcare Providers:  GravelBags.it This test is not yet ap proved or cleared by the [place] FDA and  has been authorized for detection and/or diagnosis of SARS-CoV-2 by FDA under an Emergency Use Authorization (EUA). This EUA will remain  in effect (meaning this test can be used) for the duration of the COVID-19 declaration under Section 564(b)(1) of the Act, 21 U.S.C. section 360bbb-3(b)(1), unless the authorization is terminated or revoked sooner.    Influenza A by PCR NEGATIVE NEGATIVE Final   Influenza B by PCR NEGATIVE NEGATIVE Final    Comment: (NOTE) The Xpert Xpress SARS-CoV-2/FLU/RSV assay is intended as an aid in  the diagnosis of influenza from Nasopharyngeal swab specimens and  should not be used as a sole basis for treatment. Nasal washings and  aspirates are unacceptable for Xpert Xpress SARS-CoV-2/FLU/RSV  testing. Fact Sheet for Patients: PinkCheek.be Fact Sheet for Healthcare Providers: GravelBags.it This test is not yet approved or cleared by the [place] FDA and  has been authorized for detection and/or diagnosis of SARS-CoV-2 by  FDA under an Emergency Use Authorization (EUA). This EUA will remain  in effect (meaning this test can be used) for the duration of the   Covid-19 declaration under Section 564(b)(1) of the Act, 21  U.S.C. section 360bbb-3(b)(1), unless the authorization is  terminated or revoked. Performed at [facility], [address] [place], [place] [address]      Time coordinating discharge: 35 minutes  SIGNED:   Erick Blinks, DO [facility] 03/02/2020, 8:45 AM  If 7PM-7AM, please contact night-coverage [email or web address]

## 2020-03-02 NOTE — Progress Notes (Signed)
Discussed AVS including medications & need for follow up appointments with the patient and all questioned fully answered.  

## 2020-03-07 DIAGNOSIS — E1165 Type 2 diabetes mellitus with hyperglycemia: Secondary | ICD-10-CM | POA: Diagnosis not present

## 2020-03-07 DIAGNOSIS — I200 Unstable angina: Secondary | ICD-10-CM | POA: Diagnosis not present

## 2020-03-07 DIAGNOSIS — E782 Mixed hyperlipidemia: Secondary | ICD-10-CM | POA: Diagnosis not present

## 2020-03-20 DIAGNOSIS — E7849 Other hyperlipidemia: Secondary | ICD-10-CM | POA: Diagnosis not present

## 2020-03-20 DIAGNOSIS — E119 Type 2 diabetes mellitus without complications: Secondary | ICD-10-CM | POA: Diagnosis not present

## 2020-03-21 DIAGNOSIS — E7849 Other hyperlipidemia: Secondary | ICD-10-CM | POA: Diagnosis not present

## 2020-03-21 DIAGNOSIS — E119 Type 2 diabetes mellitus without complications: Secondary | ICD-10-CM | POA: Diagnosis not present

## 2020-03-23 DIAGNOSIS — N200 Calculus of kidney: Secondary | ICD-10-CM | POA: Diagnosis not present

## 2020-03-23 DIAGNOSIS — R8290 Unspecified abnormal findings in urine: Secondary | ICD-10-CM | POA: Diagnosis not present

## 2020-03-23 DIAGNOSIS — M549 Dorsalgia, unspecified: Secondary | ICD-10-CM | POA: Diagnosis not present

## 2020-03-23 DIAGNOSIS — R109 Unspecified abdominal pain: Secondary | ICD-10-CM | POA: Diagnosis not present

## 2020-04-01 DIAGNOSIS — I200 Unstable angina: Secondary | ICD-10-CM | POA: Diagnosis not present

## 2020-04-01 DIAGNOSIS — E782 Mixed hyperlipidemia: Secondary | ICD-10-CM | POA: Diagnosis not present

## 2020-04-01 DIAGNOSIS — E1165 Type 2 diabetes mellitus with hyperglycemia: Secondary | ICD-10-CM | POA: Diagnosis not present

## 2020-04-19 DIAGNOSIS — E1165 Type 2 diabetes mellitus with hyperglycemia: Secondary | ICD-10-CM | POA: Diagnosis not present

## 2020-04-19 DIAGNOSIS — I10 Essential (primary) hypertension: Secondary | ICD-10-CM | POA: Diagnosis not present

## 2020-04-19 DIAGNOSIS — E7849 Other hyperlipidemia: Secondary | ICD-10-CM | POA: Diagnosis not present

## 2020-04-20 DIAGNOSIS — I10 Essential (primary) hypertension: Secondary | ICD-10-CM | POA: Diagnosis not present

## 2020-04-20 DIAGNOSIS — E7849 Other hyperlipidemia: Secondary | ICD-10-CM | POA: Diagnosis not present

## 2020-04-20 DIAGNOSIS — E1165 Type 2 diabetes mellitus with hyperglycemia: Secondary | ICD-10-CM | POA: Diagnosis not present

## 2020-05-21 DIAGNOSIS — E1165 Type 2 diabetes mellitus with hyperglycemia: Secondary | ICD-10-CM | POA: Diagnosis not present

## 2020-05-21 DIAGNOSIS — I10 Essential (primary) hypertension: Secondary | ICD-10-CM | POA: Diagnosis not present

## 2020-05-21 DIAGNOSIS — E7849 Other hyperlipidemia: Secondary | ICD-10-CM | POA: Diagnosis not present

## 2020-07-22 DIAGNOSIS — R5383 Other fatigue: Secondary | ICD-10-CM | POA: Diagnosis not present

## 2020-07-22 DIAGNOSIS — R05 Cough: Secondary | ICD-10-CM | POA: Diagnosis not present

## 2020-07-30 DIAGNOSIS — I10 Essential (primary) hypertension: Secondary | ICD-10-CM | POA: Diagnosis not present

## 2020-07-30 DIAGNOSIS — E782 Mixed hyperlipidemia: Secondary | ICD-10-CM | POA: Diagnosis not present

## 2020-07-30 DIAGNOSIS — Z23 Encounter for immunization: Secondary | ICD-10-CM | POA: Diagnosis not present

## 2020-07-30 DIAGNOSIS — R5383 Other fatigue: Secondary | ICD-10-CM | POA: Diagnosis not present

## 2020-07-30 DIAGNOSIS — M1612 Unilateral primary osteoarthritis, left hip: Secondary | ICD-10-CM | POA: Diagnosis not present

## 2020-07-30 DIAGNOSIS — Z0001 Encounter for general adult medical examination with abnormal findings: Secondary | ICD-10-CM | POA: Diagnosis not present

## 2020-07-30 DIAGNOSIS — Z6821 Body mass index (BMI) 21.0-21.9, adult: Secondary | ICD-10-CM | POA: Diagnosis not present

## 2020-07-30 DIAGNOSIS — E119 Type 2 diabetes mellitus without complications: Secondary | ICD-10-CM | POA: Diagnosis not present

## 2020-07-30 DIAGNOSIS — M545 Low back pain: Secondary | ICD-10-CM | POA: Diagnosis not present

## 2020-08-21 DIAGNOSIS — E7849 Other hyperlipidemia: Secondary | ICD-10-CM | POA: Diagnosis not present

## 2020-08-21 DIAGNOSIS — M19041 Primary osteoarthritis, right hand: Secondary | ICD-10-CM | POA: Diagnosis not present

## 2020-08-21 DIAGNOSIS — E1165 Type 2 diabetes mellitus with hyperglycemia: Secondary | ICD-10-CM | POA: Diagnosis not present

## 2020-08-21 DIAGNOSIS — I10 Essential (primary) hypertension: Secondary | ICD-10-CM | POA: Diagnosis not present

## 2020-09-01 ENCOUNTER — Emergency Department (HOSPITAL_COMMUNITY): Payer: Medicare Other

## 2020-09-01 ENCOUNTER — Other Ambulatory Visit: Payer: Self-pay

## 2020-09-01 ENCOUNTER — Encounter (HOSPITAL_COMMUNITY): Payer: Self-pay | Admitting: Emergency Medicine

## 2020-09-01 ENCOUNTER — Emergency Department (HOSPITAL_COMMUNITY)
Admission: EM | Admit: 2020-09-01 | Discharge: 2020-09-01 | Disposition: A | Discharge status: Home / Self Care | Payer: Medicare Other | Attending: Emergency Medicine | Admitting: Emergency Medicine

## 2020-09-01 DIAGNOSIS — I5032 Chronic diastolic (congestive) heart failure: Secondary | ICD-10-CM | POA: Diagnosis not present

## 2020-09-01 DIAGNOSIS — I2510 Atherosclerotic heart disease of native coronary artery without angina pectoris: Secondary | ICD-10-CM | POA: Insufficient documentation

## 2020-09-01 DIAGNOSIS — R079 Chest pain, unspecified: Secondary | ICD-10-CM | POA: Diagnosis not present

## 2020-09-01 DIAGNOSIS — E785 Hyperlipidemia, unspecified: Secondary | ICD-10-CM | POA: Insufficient documentation

## 2020-09-01 DIAGNOSIS — I110 Hypertensive heart disease with heart failure: Secondary | ICD-10-CM | POA: Diagnosis not present

## 2020-09-01 DIAGNOSIS — M25519 Pain in unspecified shoulder: Secondary | ICD-10-CM | POA: Diagnosis not present

## 2020-09-01 DIAGNOSIS — E1169 Type 2 diabetes mellitus with other specified complication: Secondary | ICD-10-CM | POA: Insufficient documentation

## 2020-09-01 DIAGNOSIS — Z20822 Contact with and (suspected) exposure to covid-19: Secondary | ICD-10-CM | POA: Insufficient documentation

## 2020-09-01 DIAGNOSIS — Z87891 Personal history of nicotine dependence: Secondary | ICD-10-CM | POA: Diagnosis not present

## 2020-09-01 DIAGNOSIS — Z79899 Other long term (current) drug therapy: Secondary | ICD-10-CM | POA: Diagnosis not present

## 2020-09-01 DIAGNOSIS — I480 Paroxysmal atrial fibrillation: Secondary | ICD-10-CM | POA: Diagnosis not present

## 2020-09-01 DIAGNOSIS — Z743 Need for continuous supervision: Secondary | ICD-10-CM | POA: Diagnosis not present

## 2020-09-01 DIAGNOSIS — Z9861 Coronary angioplasty status: Secondary | ICD-10-CM | POA: Diagnosis not present

## 2020-09-01 DIAGNOSIS — R0689 Other abnormalities of breathing: Secondary | ICD-10-CM | POA: Diagnosis not present

## 2020-09-01 DIAGNOSIS — R0789 Other chest pain: Secondary | ICD-10-CM | POA: Diagnosis not present

## 2020-09-01 LAB — COMPREHENSIVE METABOLIC PANEL
ALT: 16 U/L (ref 0–44)
AST: 19 U/L (ref 15–41)
Albumin: 4.7 g/dL (ref 3.5–5.0)
Alkaline Phosphatase: 45 U/L (ref 38–126)
Anion gap: 11 (ref 5–15)
BUN: 16 mg/dL (ref 8–23)
CO2: 29 mmol/L (ref 22–32)
Calcium: 10 mg/dL (ref 8.9–10.3)
Chloride: 100 mmol/L (ref 98–111)
Creatinine, Ser: 0.62 mg/dL (ref 0.44–1.00)
GFR calc Af Amer: >60 mL/min (ref >60)
GFR calc non Af Amer: >60 mL/min (ref >60)
Glucose, Bld: 132 mg/dL — ABNORMAL HIGH (ref 70–99)
Potassium: 4.3 mmol/L (ref 3.5–5.1)
Sodium: 140 mmol/L (ref 135–145)
Total Bilirubin: 0.9 mg/dL (ref 0.3–1.2)
Total Protein: 8 g/dL (ref 6.5–8.1)

## 2020-09-01 LAB — CBC
HCT: 40.7 % (ref 36.0–46.0)
Hemoglobin: 13.2 g/dL (ref 12.0–15.0)
MCH: 33.1 pg (ref 26.0–34.0)
MCHC: 32.4 g/dL (ref 30.0–36.0)
MCV: 102 fL — ABNORMAL HIGH (ref 80.0–100.0)
Platelets: 194 10*3/uL (ref 150–400)
RBC: 3.99 MIL/uL (ref 3.87–5.11)
RDW: 13.1 % (ref 11.5–15.5)
WBC: 4 10*3/uL (ref 4.0–10.5)
nRBC: 0 % (ref 0.0–0.2)

## 2020-09-01 LAB — D-DIMER, QUANTITATIVE: D-Dimer, Quant: 0.58 ug/mL-FEU — ABNORMAL HIGH (ref 0.00–0.50)

## 2020-09-01 LAB — TROPONIN I (HIGH SENSITIVITY)
Troponin I (High Sensitivity): 7 ng/L (ref <18)
Troponin I (High Sensitivity): 7 ng/L (ref <18)

## 2020-09-01 MED ORDER — APIXABAN 2.5 MG PO TABS
2.5000 mg | ORAL_TABLET | Freq: Once | ORAL | Status: AC
  Administered 2020-09-01: 2.5 mg via ORAL
  Filled 2020-09-01: qty 1

## 2020-09-01 MED ORDER — ACETAMINOPHEN 500 MG PO TABS
1000.0000 mg | ORAL_TABLET | Freq: Once | ORAL | Status: AC
  Administered 2020-09-01: 1000 mg via ORAL
  Filled 2020-09-01: qty 2

## 2020-09-01 MED ORDER — APIXABAN (ELIQUIS) EDUCATION KIT FOR DVT/PE PATIENTS: PACK | Freq: Once | Status: AC

## 2020-09-01 MED ORDER — APIXABAN 2.5 MG PO TABS: 2.5000 mg | ORAL_TABLET | Freq: Two times a day (BID) | ORAL | Status: DC

## 2020-09-01 MED ORDER — APIXABAN 2.5 MG PO TABS: 2.5000 mg | ORAL_TABLET | Freq: Two times a day (BID) | ORAL | 0 refills | Status: DC

## 2020-09-01 NOTE — ED Provider Notes (Signed)
EKG looks like atrial fib.  Pt said she has had paroxysmal afib in the past.  The pt was on a blood thinner several years ago and had a GI bleed and was never put back on it.  This irregular HR could be the discomfort patient is feeling.  Pt is on Coreg and rate is controlled.  She is not anticoagulated, but needs to be per chadvasc score.  She knows the risks of anticoagulation.  Pt will be started on Eliquis at 2.5 mg bid (lower dose due to age and weight).  Delta trop negative.  She sees Dr. Herbie Baltimore for cards and knows to f/u with him.  Return if worse.  Chadvasc: 6 Age, sex, htn, hx cad, dm       Jacalyn Lefevre, MD 09/01/20 [phone]

## 2020-09-01 NOTE — ED Provider Notes (Signed)
[facility] EMERGENCY DEPARTMENT Provider Note   CSN: [id number] Arrival date & time: 09/01/20  1347     History Chief Complaint  Patient presents with  . Chest Pain    Floydene FlockShirley A Archuleta is a 84 y.o. female.  Patient c/o left back and lateral chest pain for the past 2-3 days. Symptoms acute onset, at rest, mild-moderate, non radiating, dull, without specific exacerbating or alleviating factors. Unlike remote prior cardiac chest pain, which was on right. Denies exertional cp. No pleuritic pain. No cough or uri symptoms. No fever or chills. No leg pain or swelling. No abd pain or nausea/vomiting. No associated diaphoresis. No skin changes, swelling, or rash to area of pain. No back strain or injury.   The history is provided by the patient.  Chest Pain Associated symptoms: no abdominal pain, no cough, no fever, no headache, no palpitations, no shortness of breath and no vomiting        Past Medical History:  Diagnosis Date  . Aortic stenosis, mild 08/2011; 08/2013   Echo 11/09/18: EF 60-65%. Gr 2 DD.  No RWMA.  Mild-Mod AS (mean gradient 19 mmHg). Mild-Mod PAH.  Marland Kitchen. CAD S/P percutaneous coronary angioplasty 1990s, 11/28/2011    a) midRCA PCI 1999 (NIR BMS 3.0 mm x 32 mm); distal  RCA BMS Sept 2012 (Vision BMS 2.5 mm x 15 mm); 12/02: RCA ISR --> Cutting PTCA   . Dyslipidemia, goal LDL below 70 11/28/2011  . Essential hypertension    On ACE inhibitor and ARB? As well as atenolol and hydralazine  . Gastric ulcer 11/2018  . GERD (gastroesophageal reflux disease)   . History of blood transfusion    without reaction  . Myocardial infarction (HCC) 1990s   Had PTCA then PCI on RCA; 11/2011--> Dyspnea and Shoulder pain prior to adm   . Type 2 diabetes mellitus with complication, without long-term current use of insulin (HCC)    no longer taking diabetic medication - diet controlled    Patient Active Problem List   Diagnosis Date Noted  . AKI (acute kidney injury) (HCC) 03/01/2020  .  Chest pain 02/29/2020  . Gastric ulcer 10/01/2018  . Melena 06/01/2018  . Lower extremity edema 05/11/2017  . Hypertensive heart disease with chronic diastolic congestive heart failure (HCC) 05/11/2017  . Coronary stent restenosis due to progression of disease 11/30/2011  . Presence of stent in right coronary artery, HSRA for ISR in 2012 11/28/2011    Class: History of  . Moderate essential hypertension 11/28/2011    Class: History of  . Hyperlipidemia associated with type 2 diabetes mellitus (HCC) 11/28/2011    Class: History of  . History of: Acute coronary syndrome 11/28/2011    Class: History of  . Anemia associated with acute blood loss 11/28/2011  . Diabetes mellitus 11/28/2011  . Aortic stenosis, moderate 11/28/2011  . Coronary artery disease involving native coronary artery without angina pectoris 07/31/2011    Past Surgical History:  Procedure Laterality Date  . ABDOMINAL HYSTERECTOMY    . BIOPSY  06/03/2018   Procedure: BIOPSY;  Surgeon: Malissa Hippoehman, Najeeb U, MD;  Location: [facility];  Service: Endoscopy;;  gastric   . BREAST BIOPSY    . CARDIAC CATHETERIZATION  2012   performed for angina  . CARDIAC CATHETERIZATION    . CORONARY ANGIOPLASTY  11/2006   Cutting Balloon of stent in the RCA  . ESOPHAGOGASTRODUODENOSCOPY N/A 06/03/2018   Procedure: ESOPHAGOGASTRODUODENOSCOPY (EGD);  Surgeon: Malissa Hippoehman, Najeeb U, MD;  Location: [facility]  ENDO SUITE;  Service: Endoscopy;  Laterality: N/A;  1:40  . ESOPHAGOGASTRODUODENOSCOPY N/A 12/02/2018   Procedure: ESOPHAGOGASTRODUODENOSCOPY (EGD);  Surgeon: Malissa Hippo, MD;  Location: [facility];  Service: Endoscopy;  Laterality: N/A;  255  . LEFT HEART CATHETERIZATION WITH CORONARY ANGIOGRAM N/A 11/27/2011   Procedure: LEFT HEART CATHETERIZATION WITH CORONARY ANGIOGRAM;  Surgeon: Marykay Lex, MD;  Location: [facility] CATH LAB;  Service: Cardiovascular;  Laterality: N/A;  . PERCUTANEOUS CORONARY STENT INTERVENTION (PCI-S)  1999   Mid RCA - NIR  DMS 3.0 mm x 30 mm, dRCA Vision BMS 2.5 mm x 15 mm   . TRANSTHORACIC ECHOCARDIOGRAM  10/2018   EF 60-65%. Gr 2 DD.  No RWMA.  Mild-Mod AS  (mean gradient 19 mmHg). Mild-Mod PAH.  Marland Kitchen TRANSTHORACIC ECHOCARDIOGRAM  11/2016   2-D Echo 12/12/2016: vigorous LV function with EF 65-70%. GR 2 DD. No RWMA. Mild to mod AS (peak gradient 35 mmHg). Mod LA dilation. Mild MR, Mod TR. PA pressures ~35-40 mmHg.  . TUBAL LIGATION       OB History   No obstetric history on file.     Family History  Problem Relation Age of Onset  . Cancer Mother   . Heart disease Father   . Heart disease Maternal Grandmother   . Heart disease Maternal Grandfather   . Heart disease Brother   . Nephrolithiasis Brother        accidental death  . Heart Problems Brother        CABG    Social History   Tobacco Use  . Smoking status: Former Games developer  . Smokeless tobacco: Never Used  . Tobacco comment: Quit approximately 37  Vaping Use  . Vaping Use: Never used  Substance Use Topics  . Alcohol use: No  . Drug use: No    Home Medications Prior to Admission medications   Medication Sig Start Date End Date Taking? Authorizing Provider  acetaminophen (TYLENOL) 500 MG tablet Take 1,000 mg by mouth 2 (two) times daily as needed (pain.).    [provider]  atorvastatin (LIPITOR) 20 MG tablet Take 20 mg by mouth at bedtime.     [provider]  benazepril (LOTENSIN) 40 MG tablet Take 1 tablet (40 mg total) by mouth daily. 11/22/19   Marykay Lex, MD  carvedilol (COREG) 12.5 MG tablet Take 1.5 tablets (18.75 mg total) by mouth 2 (two) times daily. 11/22/19   Marykay Lex, MD  diclofenac Sodium (VOLTAREN) 1 % GEL  07/25/19   [provider]  furosemide (LASIX) 40 MG tablet Take 40 mg by mouth daily as needed for fluid.     [provider]  hydrALAZINE (APRESOLINE) 100 MG tablet TAKE ONE TABLET BY MOUTH TWICE DAILY Patient taking differently: Take 100 mg by mouth 2 (two) times daily.   11/09/17   Marykay Lex, MD  isosorbide mononitrate (IMDUR) 30 MG 24 hr tablet Take 0.5 tablets (15 mg total) by mouth daily. 03/02/20 04/01/20  Sherryll Burger, Pratik D, DO  NITROSTAT 0.4 MG SL tablet DISSOLVE 1 TAB UNDER TOUNGE FOR CHEST PAIN. MAY REPEAT EVERY 5 MINUTES FOR 3 DOSES. IF NO RELIEF CALL 911 OR GO TO ER Patient not taking: No sig reported 11/24/16   Marykay Lex, MD  traMADol (ULTRAM) 50 MG tablet Take 50 mg by mouth every 6 (six) hours as needed for moderate pain.  05/30/15   [provider]    Allergies    Valium  Review  of Systems   Review of Systems  Constitutional: Negative for chills and fever.  HENT: Negative for sore throat.   Eyes: Negative for redness.  Respiratory: Negative for cough and shortness of breath.   Cardiovascular: Positive for chest pain. Negative for palpitations and leg swelling.  Gastrointestinal: Negative for abdominal pain and vomiting.  Genitourinary: Negative for flank pain.  Musculoskeletal: Negative for neck pain.  Skin: Negative for rash.  Neurological: Negative for headaches.  Hematological: Does not bruise/bleed easily.  Psychiatric/Behavioral: Negative for confusion.    Physical Exam Updated Vital Signs There were no vitals taken for this visit.  Physical Exam Vitals and nursing note reviewed.  Constitutional:      Appearance: Normal appearance. She is well-developed.  HENT:     Head: Atraumatic.     Nose: Nose normal.     Mouth/Throat:     Mouth: Mucous membranes are moist.  Eyes:     General: No scleral icterus.    Conjunctiva/sclera: Conjunctivae normal.     Pupils: Pupils are equal, round, and reactive to light.  Neck:     Trachea: No tracheal deviation.  Cardiovascular:     Rate and Rhythm: Normal rate and regular rhythm.     Pulses: Normal pulses.     Heart sounds: Normal heart sounds. No murmur heard.  No friction rub. No gallop.   Pulmonary:     Effort: Pulmonary effort is normal. No respiratory distress.      Breath sounds: Normal breath sounds.  Chest:     Chest wall: No tenderness.  Abdominal:     General: Bowel sounds are normal. There is no distension.     Palpations: Abdomen is soft.     Tenderness: There is no abdominal tenderness. There is no guarding.  Genitourinary:    Comments: No cva tenderness.  Musculoskeletal:        General: No swelling or tenderness.     Cervical back: Normal range of motion and neck supple. No rigidity. No muscular tenderness.     Right lower leg: No edema.     Left lower leg: No edema.     Comments: T/L spine non tender, aligned.   Skin:    General: Skin is warm and dry.     Findings: No rash.  Neurological:     Mental Status: She is alert.     Comments: Alert, speech normal.   Psychiatric:        Mood and Affect: Mood normal.     ED Results / Procedures / Treatments   Labs (all labs ordered are listed, but only abnormal results are displayed) Results for orders placed or performed during the hospital encounter of 02/29/20  Respiratory Panel by RT PCR (Flu A&B, Covid) - Nasopharyngeal Swab   Specimen: Nasopharyngeal Swab  Result Value Ref Range   SARS Coronavirus 2 by RT PCR NEGATIVE NEGATIVE   Influenza A by PCR NEGATIVE NEGATIVE   Influenza B by PCR NEGATIVE NEGATIVE  Basic metabolic panel  Result Value Ref Range   Sodium 141 135 - 145 mmol/L   Potassium 3.7 3.5 - 5.1 mmol/L   Chloride 102 98 - 111 mmol/L   CO2 29 22 - 32 mmol/L   Glucose, Bld 128 (H) 70 - 99 mg/dL   BUN 14 8 - 23 mg/dL   Creatinine, Ser 5.18 0.44 - 1.00 mg/dL   Calcium 9.9 8.9 - 84.1 mg/dL   GFR calc non Af Amer >60 >60 mL/min   GFR  calc Af Amer >60 >60 mL/min   Anion gap 10 5 - 15  CBC  Result Value Ref Range   WBC 4.2 4.0 - 10.5 K/uL   RBC 3.87 3.87 - 5.11 MIL/uL   Hemoglobin 13.2 12.0 - 15.0 g/dL   HCT 16.1 36 - 46 %   MCV 102.3 (H) 80.0 - 100.0 fL   MCH 34.1 (H) 26.0 - 34.0 pg   MCHC 33.3 30.0 - 36.0 g/dL   RDW 09.6 04.5 - 40.9 %   Platelets 208 150  - 400 K/uL   nRBC 0.0 0.0 - 0.2 %  Hemoglobin A1c  Result Value Ref Range   Hgb A1c MFr Bld 5.1 4.8 - 5.6 %   Mean Plasma Glucose 99.67 mg/dL  Basic metabolic panel  Result Value Ref Range   Sodium 138 135 - 145 mmol/L   Potassium 3.6 3.5 - 5.1 mmol/L   Chloride 100 98 - 111 mmol/L   CO2 28 22 - 32 mmol/L   Glucose, Bld 116 (H) 70 - 99 mg/dL   BUN 21 8 - 23 mg/dL   Creatinine, Ser 8.11 (H) 0.44 - 1.00 mg/dL   Calcium 9.0 8.9 - 91.4 mg/dL   GFR calc non Af Amer 34 (L) >60 mL/min   GFR calc Af Amer 39 (L) >60 mL/min   Anion gap 10 5 - 15  Glucose, capillary  Result Value Ref Range   Glucose-Capillary 106 (H) 70 - 99 mg/dL  Glucose, capillary  Result Value Ref Range   Glucose-Capillary 94 70 - 99 mg/dL  Urinalysis, Routine w reflex microscopic  Result Value Ref Range   Color, Urine YELLOW YELLOW   APPearance HAZY (A) CLEAR   Specific Gravity, Urine 1.014 1.005 - 1.030   pH 5.0 5.0 - 8.0   Glucose, UA NEGATIVE NEGATIVE mg/dL   Hgb urine dipstick NEGATIVE NEGATIVE   Bilirubin Urine NEGATIVE NEGATIVE   Ketones, ur NEGATIVE NEGATIVE mg/dL   Protein, ur 30 (A) NEGATIVE mg/dL   Nitrite NEGATIVE NEGATIVE   Leukocytes,Ua TRACE (A) NEGATIVE   RBC / HPF 0-5 0 - 5 RBC/hpf   WBC, UA 6-10 0 - 5 WBC/hpf   Bacteria, UA NONE SEEN NONE SEEN   Squamous Epithelial / LPF 0-5 0 - 5   Mucus PRESENT    Hyaline Casts, UA PRESENT   Sodium, urine, random  Result Value Ref Range   Sodium, Ur 13 mmol/L  Creatinine, urine, random  Result Value Ref Range   Creatinine, Urine 246.92 mg/dL  Glucose, capillary  Result Value Ref Range   Glucose-Capillary 100 (H) 70 - 99 mg/dL  Glucose, capillary  Result Value Ref Range   Glucose-Capillary 120 (H) 70 - 99 mg/dL  Glucose, capillary  Result Value Ref Range   Glucose-Capillary 93 70 - 99 mg/dL  Basic metabolic panel  Result Value Ref Range   Sodium 140 135 - 145 mmol/L   Potassium 3.3 (L) 3.5 - 5.1 mmol/L   Chloride 101 98 - 111 mmol/L   CO2 28  22 - 32 mmol/L   Glucose, Bld 110 (H) 70 - 99 mg/dL   BUN 27 (H) 8 - 23 mg/dL   Creatinine, Ser 7.82 0.44 - 1.00 mg/dL   Calcium 9.2 8.9 - 95.6 mg/dL   GFR calc non Af Amer 58 (L) >60 mL/min   GFR calc Af Amer >60 >60 mL/min   Anion gap 11 5 - 15  Glucose, capillary  Result Value Ref Range  Glucose-Capillary 122 (H) 70 - 99 mg/dL  Glucose, capillary  Result Value Ref Range   Glucose-Capillary 108 (H) 70 - 99 mg/dL  ECHOCARDIOGRAM COMPLETE  Result Value Ref Range   Weight 2,051.2 oz   Height 66 in   BP 159/63 mmHg  Troponin I (High Sensitivity)  Result Value Ref Range   Troponin I (High Sensitivity) 12 <18 ng/L  Troponin I (High Sensitivity)  Result Value Ref Range   Troponin I (High Sensitivity) 13 <18 ng/L  Troponin I (High Sensitivity)  Result Value Ref Range   Troponin I (High Sensitivity) 16 <18 ng/L  Troponin I (High Sensitivity)  Result Value Ref Range   Troponin I (High Sensitivity) 17 <18 ng/L   EKG EKG Interpretation  Date/Time:  Saturday September 01 2020 14:39:15 EDT Ventricular Rate:  92 PR Interval:    QRS Duration: 89 QT Interval:  388 QTC Calculation: 480 R Axis:   81 Text Interpretation: Atrial fibrillation Non-specific ST-t changes Confirmed by Cathren Laine ([id number]) on 09/01/2020 2:55:31 PM   Radiology No results found.  Procedures Procedures (including critical care time)  Medications Ordered in ED Medications  acetaminophen (TYLENOL) tablet 1,000 mg (has no administration in time range)    ED Course  I have reviewed the triage vital signs and the nursing notes.  Pertinent labs & imaging results that were available during my care of the patient were reviewed by me and considered in my medical decision making (see chart for details).    MDM Rules/Calculators/A&P                         Iv ns. Continuous pulse ox and monitor. Stat labs. Ecg. Cxr.   Reviewed nursing notes and prior charts for additional history.  Remote hx right  coronary artery stent.   Acetaminophen po.   1500 labs and cxr pending - signed out to Dr Particia Nearing to check labs and cxr, recheck rhythm, and dispo appropriately. Pt currently appears to be in afib, no hx same, not on anticoag therapy, and pt unaware of any rapid or irregular heartbeat.     Final Clinical Impression(s) / ED Diagnoses Final diagnoses:  None    Rx / DC Orders ED Discharge Orders    None       Cathren Laine, MD 09/01/20 1506

## 2020-09-01 NOTE — ED Triage Notes (Signed)
Pt complains of chest pain x 3 days. MI in 1994 2 stents placed last in 2012. Pt is not on any blood thinners. Patient took 324 mg of aspirin at home. A&Ox4. NAD noted.

## 2020-09-10 ENCOUNTER — Other Ambulatory Visit: Payer: Self-pay

## 2020-09-10 ENCOUNTER — Ambulatory Visit: Payer: Medicare Other | Admitting: Physician Assistant

## 2020-09-10 VITALS — BP 180/92 | HR 86 | Ht 66.0 in | Wt 127.6 lb

## 2020-09-10 DIAGNOSIS — E785 Hyperlipidemia, unspecified: Secondary | ICD-10-CM | POA: Diagnosis not present

## 2020-09-10 DIAGNOSIS — Z79899 Other long term (current) drug therapy: Secondary | ICD-10-CM

## 2020-09-10 DIAGNOSIS — Z9861 Coronary angioplasty status: Secondary | ICD-10-CM

## 2020-09-10 DIAGNOSIS — I10 Essential (primary) hypertension: Secondary | ICD-10-CM

## 2020-09-10 DIAGNOSIS — I2510 Atherosclerotic heart disease of native coronary artery without angina pectoris: Secondary | ICD-10-CM

## 2020-09-10 DIAGNOSIS — I350 Nonrheumatic aortic (valve) stenosis: Secondary | ICD-10-CM

## 2020-09-10 DIAGNOSIS — I4819 Other persistent atrial fibrillation: Secondary | ICD-10-CM

## 2020-09-10 MED ORDER — CARVEDILOL 25 MG PO TABS
25.0000 mg | ORAL_TABLET | Freq: Two times a day (BID) | ORAL | 3 refills | Status: DC
Start: 2020-09-10 — End: 2022-09-12

## 2020-09-10 NOTE — Progress Notes (Signed)
Cardiology Office Note:    Date:  09/12/2020   ID:  RMONI KEPLINGER, DOB [birth year], MRN [id number]  PCP:  Estanislado Pandy, MD  [facility] Cardiologist:  Bryan Lemma, MD  [facility] Electrophysiologist:  None   Referring MD: Estanislado Pandy, MD   Chief Complaint  Patient presents with  . Follow-up    ED VISIT TO [facility]    History of Present Illness:    [name] is a 84 y.o. female with a hx of CAD, hypertension, hyperlipidemia, aortic stenosis, diet-controlled DM2 and GERD.  He had a history of inferior NSTEMI in 1999 and underwent BMS to mid RCA.  He later underwent balloon angioplasty for RCA in-stent restenosis in December 2002.  Last cardiac catheterization was in 08/2011, and he underwent BMS to distal RCA.  Most recent echocardiogram obtained on 03/01/2020 showed EF 70 to 75%, mild LVH, RVSP 36.9 mmHg, mild MR, moderate to severe AS.  EGD performed in December 2019 showed gastric antral ulcer, erythematous mucosa of the antrum, 2 cm hiatal hernia.  She was seen by Dr. Herbie Baltimore in December 2020, benazepril was increased to 40 mg daily, carvedilol was also increased to 18.75 mg twice a day.  She was admitted to the hospital in March 2021 with chest pain.  Troponin was negative.  A 2D echo showed EF 70-75% with grade 2 DD, moderate to severe aortic stenosis, mild MR, RVSP 36.9 mmHg.  Renal ultrasound shows normal kidney size.  She had prerenal AKI on arrival, however this quickly resolved.  She returned to the ED on 09/01/2020 due to chest discomfort, EKG showed new atrial fibrillation.  Heart rate was controlled, therefore she was placed on 2.5 mg twice a day of Eliquis.  Patient presents today for follow-up.  She says her symptom on the last ED visit with intermittent chest pain which was attributed to atrial fibrillation.  She does not have any lower extremity edema, shortness of breath, orthopnea or PND.  She has been compliant with Eliquis.  Heart rate has been in the  80s.  I will further increase carvedilol to 25 mg twice a day for better rate control.  I plan to bring the patient back in 4 weeks to discuss cardioversion.  The patient likely will also need a repeat echocardiogram either near the end of this year or early next year given the moderate to severe aortic stenosis.  Otherwise she does not appears to have any symptom associated with aortic stenosis at this point.   Past Medical History:  Diagnosis Date  . Aortic stenosis, mild 08/2011; 08/2013   Echo 11/09/18: EF 60-65%. Gr 2 DD.  No RWMA.  Mild-Mod AS (mean gradient 19 mmHg). Mild-Mod PAH.  Marland Kitchen CAD S/P percutaneous coronary angioplasty 1990s, 11/28/2011    a) midRCA PCI 1999 (NIR BMS 3.0 mm x 32 mm); distal  RCA BMS Sept 2012 (Vision BMS 2.5 mm x 15 mm); 12/02: RCA ISR --> Cutting PTCA   . Dyslipidemia, goal LDL below 70 11/28/2011  . Essential hypertension    On ACE inhibitor and ARB? As well as atenolol and hydralazine  . Gastric ulcer 11/2018  . GERD (gastroesophageal reflux disease)   . History of blood transfusion    without reaction  . Myocardial infarction (HCC) 1990s   Had PTCA then PCI on RCA; 11/2011--> Dyspnea and Shoulder pain prior to adm   . Type 2 diabetes mellitus with complication, without long-term current use of insulin (HCC)  no longer taking diabetic medication - diet controlled    Past Surgical History:  Procedure Laterality Date  . ABDOMINAL HYSTERECTOMY    . BIOPSY  06/03/2018   Procedure: BIOPSY;  Surgeon: Malissa Hippoehman, Najeeb U, MD;  [facility]: [facility];  Service: Endoscopy;;  gastric   . BREAST BIOPSY    . CARDIAC CATHETERIZATION  2012   performed for angina  . CARDIAC CATHETERIZATION    . CORONARY ANGIOPLASTY  11/2006   Cutting Balloon of stent in the RCA  . ESOPHAGOGASTRODUODENOSCOPY N/A 06/03/2018   Procedure: ESOPHAGOGASTRODUODENOSCOPY (EGD);  Surgeon: Malissa Hippoehman, Najeeb U, MD;  Location: [facility];  Service: Endoscopy;  Laterality: N/A;  1:40  .  ESOPHAGOGASTRODUODENOSCOPY N/A 12/02/2018   Procedure: ESOPHAGOGASTRODUODENOSCOPY (EGD);  Surgeon: Malissa Hippoehman, Najeeb U, MD;  Location: [facility];  Service: Endoscopy;  Laterality: N/A;  255  . LEFT HEART CATHETERIZATION WITH CORONARY ANGIOGRAM N/A 11/27/2011   Procedure: LEFT HEART CATHETERIZATION WITH CORONARY ANGIOGRAM;  Surgeon: Marykay Lexavid W Harding, MD;  [facility];  Service: Cardiovascular;  Laterality: N/A;  . PERCUTANEOUS CORONARY STENT INTERVENTION (PCI-S)  1999   Mid RCA - NIR DMS 3.0 mm x 30 mm, dRCA Vision BMS 2.5 mm x 15 mm   . TRANSTHORACIC ECHOCARDIOGRAM  10/2018   EF 60-65%. Gr 2 DD.  No RWMA.  Mild-Mod AS  (mean gradient 19 mmHg). Mild-Mod PAH.  Marland Kitchen. TRANSTHORACIC ECHOCARDIOGRAM  11/2016   2-D Echo 12/12/2016: vigorous LV function with EF 65-70%. GR 2 DD. No RWMA. Mild to mod AS (peak gradient 35 mmHg). Mod LA dilation. Mild MR, Mod TR. PA pressures ~35-40 mmHg.  . TUBAL LIGATION      Current Medications: Current Meds  Medication Sig  . acetaminophen (TYLENOL) 500 MG tablet Take 1,000 mg by mouth 2 (two) times daily as needed (pain.).  Marland Kitchen. apixaban (ELIQUIS) 2.5 MG TABS tablet Take 1 tablet (2.5 mg total) by mouth 2 (two) times daily.  . benazepril (LOTENSIN) 40 MG tablet Take 1 tablet (40 mg total) by mouth daily. (Patient taking differently: Take 30 mg by mouth in the morning. )  . carvedilol (COREG) 25 MG tablet Take 1 tablet (25 mg total) by mouth 2 (two) times daily.  . diclofenac Sodium (VOLTAREN) 1 % GEL Apply 2 g topically daily as needed (for pain).   . furosemide (LASIX) 40 MG tablet Take 40 mg by mouth daily as needed for fluid.   . hydrALAZINE (APRESOLINE) 100 MG tablet TAKE ONE TABLET BY MOUTH TWICE DAILY (Patient taking differently: Take 100 mg by mouth 2 (two) times daily. )  . NITROSTAT 0.4 MG SL tablet DISSOLVE 1 TAB UNDER TOUNGE FOR CHEST PAIN. MAY REPEAT EVERY 5 MINUTES FOR 3 DOSES. IF NO RELIEF CALL 911 OR GO TO ER (Patient taking differently: Place 0.4 mg  under the tongue every 5 (five) minutes as needed for chest pain. )  . traMADol (ULTRAM) 50 MG tablet Take 50 mg by mouth every 6 (six) hours.   . [DISCONTINUED] carvedilol (COREG) 12.5 MG tablet Take 1.5 tablets (18.75 mg total) by mouth 2 (two) times daily. (Patient taking differently: Take 12.5 mg by mouth 2 (two) times daily. )     Allergies:   Valium   Social History   Socioeconomic History  . Marital status: Married    Spouse name: Not on file  . Number of children: Not on file  . Years of education: Not on file  . Highest education level: Not on file  Occupational History  . Not on file  Tobacco Use  . Smoking status: Former Games developer  . Smokeless tobacco: Never Used  . Tobacco comment: Quit approximately 30  Vaping Use  . Vaping Use: Never used  Substance and Sexual Activity  . Alcohol use: No  . Drug use: No  . Sexual activity: Not Currently    Birth control/protection: Post-menopausal  Other Topics Concern  . Not on file  Social History Narrative   She is a married mother of 3, grandmother to. 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 Quit smoking in '99, denies alcohol.   She volunteers for [employer].   Social Determinants of Health   Financial Resource Strain:   . Difficulty of Paying Living Expenses: Not on file  Food Insecurity:   . Worried About Programme researcher, broadcasting/film/video in the Last Year: Not on file  . Ran Out of Food in the Last Year: Not on file  Transportation Needs:   . Lack of Transportation (Medical): Not on file  . Lack of Transportation (Non-Medical): Not on file  Physical Activity:   . Days of Exercise per Week: Not on file  . Minutes of Exercise per Session: Not on file  Stress:   . Feeling of Stress : Not on file  Social Connections:   . Frequency of Communication with Friends and Family: Not on file  . Frequency of Social Gatherings with Friends and Family: Not on file  . Attends Religious Services: Not on file  . Active Member of  Clubs or Organizations: Not on file  . Attends Banker Meetings: Not on file  . Marital Status: Not on file     Family History: The patient's family history includes Cancer in her mother; Heart Problems in her brother; Heart disease in her brother, father, maternal grandfather, and maternal grandmother; Nephrolithiasis in her brother.  ROS:   Please see the history of present illness.     All other systems reviewed and are negative.  EKGs/Labs/Other Studies Reviewed:    The following studies were reviewed today:  Echo 03/01/2020 1. Left ventricular ejection fraction, by estimation, is 70 to 75%. The  left ventricle has hyperdynamic function. The left ventricle has no  regional wall motion abnormalities. There is mild left ventricular  hypertrophy. Left ventricular diastolic  parameters are consistent with Grade II diastolic dysfunction  (pseudonormalization).  2. Right ventricular systolic function is normal. The right ventricular  size is normal. There is mildly elevated pulmonary artery systolic  pressure. The estimated right ventricular systolic pressure is 36.9 mmHg.  3. Left atrial size was severely dilated.  4. The mitral valve is grossly normal. Mild mitral valve regurgitation.  5. The aortic valve is tricuspid. Aortic valve regurgitation is not  visualized. Moderate to severe aortic valve stenosis. Aortic valve mean  gradient measures 17.3 mmHg. Aortic valve Vmax measures 2.69 m/s.  Dimentionless index 0.32.  6. The inferior vena cava is normal in size with greater than 50%  respiratory variability, suggesting right atrial pressure of 3 mmHg.   EKG:  EKG is ordered today.  The ekg ordered today demonstrates atrial fibrillation, no significant ST-T wave changes  Recent Labs: 09/01/2020: ALT 16; BUN 16; Creatinine, Ser 0.62; Hemoglobin 13.2; Platelets 194; Potassium 4.3; Sodium 140  Recent Lipid Panel No results found for: CHOL, TRIG, HDL, CHOLHDL, VLDL,  LDLCALC, LDLDIRECT  Physical Exam:    VS:  BP (!) 180/92   Pulse 86  Ht 5\' 6"  (1.676 m)   Wt 127 lb 9.6 oz (57.9 kg)   SpO2 98%   BMI 20.60 kg/m     Wt Readings from Last 3 Encounters:  09/10/20 127 lb 9.6 oz (57.9 kg)  03/01/20 128 lb 3.2 oz (58.2 kg)  11/22/19 134 lb 3.2 oz (60.9 kg)     GEN:  Well nourished, well developed in no acute distress HEENT: Normal NECK: No JVD; No carotid bruits LYMPHATICS: No lymphadenopathy CARDIAC: Irregularly irregular, no murmurs, rubs, gallops RESPIRATORY:  Clear to auscultation without rales, wheezing or rhonchi  ABDOMEN: Soft, non-tender, non-distended MUSCULOSKELETAL:  No edema; No deformity  SKIN: Warm and dry NEUROLOGIC:  Alert and oriented x 3 PSYCHIATRIC:  Normal affect   ASSESSMENT:    1. Persistent atrial fibrillation (HCC)   2. Medication management   3. CAD S/P percutaneous coronary angioplasty -- PCI with BMS to RCA followed by POBA of ISR   4. Essential hypertension   5. Hyperlipidemia LDL goal <70   6. Nonrheumatic aortic valve stenosis    PLAN:    In order of problems listed above:  1. Persistent atrial fibrillation: Increase carvedilol for better rate control.  Continue on Eliquis, she is on low-dose Eliquis due to age and weight.  I will bring the patient back in 4 weeks and discuss cardioversion.  2. CAD: No recent chest pain  3. Hypertension: Blood pressure uncontrolled, increase carvedilol to 25 mg twice a day  4. Hyperlipidemia: On Lipitor 20 mg daily  5. Aortic stenosis: Recent echocardiogram showed moderate severe aortic stenosis.  She will need an echocardiogram either near the end of this year or early next year   Medication Adjustments/Labs and Tests Ordered: Current medicines are reviewed at length with the patient today.  Concerns regarding medicines are outlined above.  Orders Placed This Encounter  Procedures  . CBC  . EKG 12-Lead   Meds ordered this encounter  Medications  . carvedilol  (COREG) 25 MG tablet    Sig: Take 1 tablet (25 mg total) by mouth 2 (two) times daily.    Dispense:  180 tablet    Refill:  3    Discontinue previous dose    Patient Instructions  Medication Instructions:   INCREASE Carvedilol (Coreg) to 25 mg 2 times a day *If you need a refill on your cardiac medications before your next appointment, please call your pharmacy*  Lab Work: Your physician recommends that you return for lab work in: 4 weeks  CBC  If you have labs (blood work) drawn today and your tests are completely normal, you will receive your results only by: 14/01/20 MyChart Message (if you have MyChart) OR . A paper copy in the mail If you have any lab test that is abnormal or we need to change your treatment, we will call you to review the results.  Testing/Procedures: NONE ordered at this time of appointment   Follow-Up: At [facility], you and your health needs are our priority.  As part of our continuing mission to provide you with exceptional heart care, we have created designated Provider Care Teams.  These Care Teams include your primary Cardiologist (physician) and Advanced Practice Providers (APPs -  Physician Assistants and Nurse Practitioners) who all work together to provide you with the care you need, when you need it.  We recommend signing up for the patient portal called "MyChart".  Sign up information is provided on this After Visit Summary.  MyChart is used to  connect with patients for Virtual Visits (Telemedicine).  Patients are able to view lab/test results, encounter notes, upcoming appointments, etc.  Non-urgent messages can be sent to your provider as well.   To learn more about what you can do with MyChart, go to [email or web address].    Your next appointment:   4 week(s)  The format for your next appointment:   In Person  Provider:   Bryan Lemma, MD or APP on a day Dr. Herbie Baltimore is in the office   Other Instructions      Signed, Azalee Course, [place]    09/12/2020 11:45 PM    [facility]

## 2020-09-10 NOTE — Patient Instructions (Signed)
Medication Instructions:   INCREASE Carvedilol (Coreg) to 25 mg 2 times a day *If you need a refill on your cardiac medications before your next appointment, please call your pharmacy*  Lab Work: Your physician recommends that you return for lab work in: 4 weeks  CBC  If you have labs (blood work) drawn today and your tests are completely normal, you will receive your results only by: Marland Kitchen MyChart Message (if you have MyChart) OR . A paper copy in the mail If you have any lab test that is abnormal or we need to change your treatment, we will call you to review the results.  Testing/Procedures: NONE ordered at this time of appointment   Follow-Up: At [facility], you and your health needs are our priority.  As part of our continuing mission to provide you with exceptional heart care, we have created designated Provider Care Teams.  These Care Teams include your primary Cardiologist (physician) and Advanced Practice Providers (APPs -  Physician Assistants and Nurse Practitioners) who all work together to provide you with the care you need, when you need it.  We recommend signing up for the patient portal called "MyChart".  Sign up information is provided on this After Visit Summary.  MyChart is used to connect with patients for Virtual Visits (Telemedicine).  Patients are able to view lab/test results, encounter notes, upcoming appointments, etc.  Non-urgent messages can be sent to your provider as well.   To learn more about what you can do with MyChart, go to [email or web address].    Your next appointment:   4 week(s)  The format for your next appointment:   In Person  Provider:   Bryan Lemma, MD or APP on a day Dr. Herbie Baltimore is in the office   Other Instructions

## 2020-10-10 ENCOUNTER — Ambulatory Visit: Payer: Medicare Other | Admitting: Cardiology

## 2020-10-11 ENCOUNTER — Encounter: Payer: Self-pay | Admitting: Cardiology

## 2020-10-11 ENCOUNTER — Ambulatory Visit: Payer: Medicare Other | Admitting: Cardiology

## 2020-10-11 ENCOUNTER — Other Ambulatory Visit: Payer: Self-pay

## 2020-10-11 VITALS — BP 154/94 | HR 86 | Ht 65.5 in | Wt 128.6 lb

## 2020-10-11 DIAGNOSIS — I2510 Atherosclerotic heart disease of native coronary artery without angina pectoris: Secondary | ICD-10-CM | POA: Diagnosis not present

## 2020-10-11 DIAGNOSIS — E1169 Type 2 diabetes mellitus with other specified complication: Secondary | ICD-10-CM

## 2020-10-11 DIAGNOSIS — I110 Hypertensive heart disease with heart failure: Secondary | ICD-10-CM

## 2020-10-11 DIAGNOSIS — I350 Nonrheumatic aortic (valve) stenosis: Secondary | ICD-10-CM

## 2020-10-11 DIAGNOSIS — E785 Hyperlipidemia, unspecified: Secondary | ICD-10-CM

## 2020-10-11 DIAGNOSIS — I10 Essential (primary) hypertension: Secondary | ICD-10-CM

## 2020-10-11 DIAGNOSIS — I214 Non-ST elevation (NSTEMI) myocardial infarction: Secondary | ICD-10-CM

## 2020-10-11 DIAGNOSIS — I4819 Other persistent atrial fibrillation: Secondary | ICD-10-CM | POA: Insufficient documentation

## 2020-10-11 DIAGNOSIS — I5032 Chronic diastolic (congestive) heart failure: Secondary | ICD-10-CM

## 2020-10-11 NOTE — Patient Instructions (Addendum)
Medication Instructions:  NO CHANGE WITH MEDICATION   *If you need a refill on your cardiac medications before your next appointment, please call your pharmacy*   Lab Work: SEE INSTRUCTION SHEET   If you have labs (blood work) drawn today and your tests are completely normal, you will receive your results only by: Marland Kitchen MyChart Message (if you have MyChart) OR . A paper copy in the mail If you have any lab test that is abnormal or we need to change your treatment, we will call you to review the results.   Testing/Procedures: Schedule an Cardioversion -- Oct 24 2020 [facility]  Your physician has recommended that you have a Cardioversion (DCCV). Electrical Cardioversion uses a jolt of electricity to your heart either through paddles or wired patches attached to your chest. This is a controlled, usually prescheduled, procedure. Defibrillation is done under light anesthesia in the hospital, and you usually go home the day of the procedure. This is done to get your heart back into a normal rhythm. You are not awake for the procedure. Please see the instruction sheet given to you today.     Follow-Up: At [facility], you and your health needs are our priority.  As part of our continuing mission to provide you with exceptional heart care, we have created designated Provider Care Teams.  These Care Teams include your primary Cardiologist (physician) and Advanced Practice Providers (APPs -  Physician Assistants and Nurse Practitioners) who all work together to provide you with the care you need, when you need it.  We recommend signing up for the patient portal called "MyChart".  Sign up information is provided on this After Visit Summary.  MyChart is used to connect with patients for Virtual Visits (Telemedicine).  Patients are able to view lab/test results, encounter notes, upcoming appointments, etc.  Non-urgent messages can be sent to your provider as well.   To learn more about what you can  do with MyChart, go to [email or web address].    Your next appointment:   3 month(s)  The format for your next appointment:   In Person  Provider:   Bryan Lemma, MD   Other Instructions Your physician recommends that you schedule a follow-up appointment in:  2 weeks after cardioversion - needs an EKG at the [facility] or [place] office  ( week of Nov 17 ,2021)    Dear  [name] are scheduled for a Cardioversion on Oct 24, 2020 with Dr. Anne Fu.  Please arrive at the [facility] (Main Entrance A) at [facility]: [address] [place], [place] [address] at 11 am.    DIET: Nothing to eat or drink after midnight except a sip of water with medications (see medication instructions below)  Medication Instructions:   Continue your anticoagulant: Eliquis  You will need to continue your anticoagulant after your procedure until you are told by your  Provider that it is safe to stop   Labs: BMP, CBC  Week of OCT 25  - you can go  To [facility] [place] or [place]     Covid testing to be done Oct 30 , 2021 Saturday at 12 noon   Go to [address] [place] , [place]    You must have a responsible person to drive you home and stay in the waiting area during your procedure. Failure to do so could result in cancellation.  Bring your insurance cards.  *Special Note: Every effort is made to have your  procedure done on time. Occasionally there are emergencies that occur at the hospital that may cause delays. Please be patient if a delay does occur.

## 2020-10-11 NOTE — H&P (View-Only) (Signed)
Primary Care Provider: Estanislado Pandy, MD Cardiologist: Bryan Lemma, MD Electrophysiologist: None  Clinic Note: Chief Complaint  Patient presents with  . Follow-up    1 month  . Atrial Fibrillation    My back and the fib noted in ER visit.  . Coronary Artery Disease    No angina  . Hypertension    Fluctuating blood pressures.    HPI:    [name] is a 84 y.o. female with a PMH notable for CAD (inferior non-STEMI-PCI), MODERATE AORTIC STENOSIS, and recurrence of PERSISTENT ATRIAL FIBRILLATION who presents today for 1 month follow-up.  Problem List Items Addressed This Visit    Coronary artery disease involving native coronary artery without angina pectoris (Chronic)  NSTEMI 1999-BMS mid RCA  Unstable angina December 2002: PTCA of RCA ISR  Last cath September 2012: BMS distal RCA   Hypertensive heart disease with chronic diastolic congestive heart failure (HCC) (Chronic)  Difficult control hypertension   Moderate essential hypertension (Chronic) -> multiple medications.   Hyperlipidemia associated with type 2 diabetes mellitus (HCC) (Chronic)   Aortic stenosis, moderate - Primary (Chronic)  Most recent echo 03/01/2020: Read as moderate-severe AS, but gradients do not match that.   Persistent atrial fibrillation (HCC) -> recent recurrence      [name] was last seen by me in clinic on November 21, 2020 -> she was.  Was getting frustrated about following her blood pressures at home.  Apparently was being called by her insurance company about high blood pressures but nothing was forwarded to either her PCP or me.  Blood pressures were anywhere from the 140s to 160s systolic.  She had just been increased to 30 mg benazepril.  No angina.  No heart failure.  Recent Hospitalizations:   3/10-11/2020: Admitted with chest pain.  Chest pain woke her up at 3 AM.  Negative troponin levels.  Echo ordered, reviewed.  Had mild AKI which resolved.  Noted that she been  undergoing lots of stress related to her husband's health.  09/01/2020-ER: Presented with left back and lateral chest pain for 2 to 3 days.  Acute onset.  Unlike anginal pain.  Was noted to be in rate controlled A. fib on EKG.  Started on 2.5 mg twice daily Eliquis; pain attributed to A. fib  [name] was then seen in hospital/ER follow-up by Azalee Course, PA on September 10, 2020-noted compliance with Eliquis.  Carvedilol titrated up to 25 mg twice daily for better rate control.  Plan was to consider cardioversion in roughly 4 weeks.  Reviewed  CV studies:    The following studies were reviewed today: (if available, images/films reviewed: From Epic Chart or Care Everywhere) . Transthoracic Echo 03/01/2020: EF 70 to 75%.  No or WMA.  Mild LVH.  GRII DD with severe LA enlargement.  Mildly elevated PAP.  Mild MR.  Moderate AS (mean gradient 17 mmHg) with dimensionless index of 0.32.   Interval History:   [name] returns here today for follow-up actually feeling pretty well.  She had about 2 days in a row and then 1 more additional day where she felt some dizziness since her last visit, but otherwise has not really noted any more chest discomfort.  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  She is on her quite a bit of stress and thinks that that may actually do with her high blood pressures.  She is not having any angina or heart failure symptoms, in fact she does not notice being in A. fib or not.  The only time she feels that he being irregular is when she is exerting herself somewhat and feels her heart rate going up.  No bleeding issues on Eliquis.  CV Review of Symptoms (Summary): positive for - irregular heartbeat and Only noticed this when her heart rate goes up with exertion.  Maybe some mild fatigue and exertional dyspnea if she does a lot, but not much difference that she can tell. negative for - chest pain, edema,  orthopnea, paroxysmal nocturnal dyspnea, rapid heart rate, shortness of breath or Syncope/near syncope or TIA/amaurosis fugax.  Claudication.  The patient does not have symptoms concerning for COVID-19 infection (fever, chills, cough, or new shortness of breath).   REVIEWED OF SYSTEMS   Review of Systems  Constitutional: Negative for malaise/fatigue and weight loss.  HENT: Negative for congestion and nosebleeds.   Respiratory: Negative for shortness of breath.   Gastrointestinal: Negative for blood in stool and melena.  Genitourinary: Negative for hematuria.  Musculoskeletal: Positive for back pain and joint pain. Negative for falls.  Neurological: Positive for dizziness (Occasional dizziness when her blood pressures go up.  He has had total of 3 days where she has had dizzy spells since the ER visit). Negative for focal weakness, seizures and weakness.  Endo/Heme/Allergies: Negative for environmental allergies. Does not bruise/bleed easily.  Psychiatric/Behavioral: Negative for depression and memory loss. The patient is nervous/anxious. The patient does not have insomnia (She does not sleep well mostly because she cannot take care of him.).        Lots of social stress.  Husband is now almost total care following his back surgery.   I have reviewed and (if needed) personally updated the patient's problem list, medications, allergies, past medical and surgical history, social and family history.   PAST MEDICAL HISTORY   Past Medical History:  Diagnosis Date  . CAD S/P percutaneous coronary angioplasty 1990s, 11/28/2011    a) midRCA PCI 1999 (NIR BMS 3.0 mm x 32 mm); distal  RCA BMS Sept 2012 (Vision BMS 2.5 mm x 15 mm); 12/02: RCA ISR --> Cutting PTCA   . Dyslipidemia, goal LDL below 70 11/28/2011  . Essential hypertension    On ACE inhibitor and ARB? As well as atenolol and hydralazine  . Gastric ulcer 11/2018  . GERD (gastroesophageal reflux disease)   . History of blood transfusion      without reaction  . Moderate aortic stenosis by prior echocardiogram 08/2011; 08/2013   Echo 11/09/18: EF 60-65%. Gr 2 DD.  No RWMA.  Mild-Mod AS (mean gradient 19 mmHg). Mild-Mod PAH. ->  Follow-up echo March 2021 showed mean gradient 17 mmHg (dimensionless index 0.32)-this was reported as moderate-severe AS  . Myocardial infarction (HCC) 1990s   Had PTCA then PCI on RCA; 11/2011--> Dyspnea and Shoulder pain prior to adm   . Type 2 diabetes mellitus with complication, without long-term current use of insulin (HCC)    no longer taking diabetic medication - diet controlled    PAST SURGICAL HISTORY   Past Surgical History:  Procedure Laterality Date  . ABDOMINAL HYSTERECTOMY    . BIOPSY  06/03/2018   Procedure: BIOPSY;  Surgeon: Malissa Hippo, MD;  Location: [facility];  Service: Endoscopy;;  gastric   . BREAST BIOPSY    . CARDIAC CATHETERIZATION  2012   performed for angina  . CARDIAC CATHETERIZATION    .  CORONARY ANGIOPLASTY  11/2006   Cutting Balloon of stent in the RCA  . ESOPHAGOGASTRODUODENOSCOPY N/A 06/03/2018   Procedure: ESOPHAGOGASTRODUODENOSCOPY (EGD);  Surgeon: Malissa Hippo, MD;  Location: [facility];  Service: Endoscopy;  Laterality: N/A;  1:40  . ESOPHAGOGASTRODUODENOSCOPY N/A 12/02/2018   Procedure: ESOPHAGOGASTRODUODENOSCOPY (EGD);  Surgeon: Malissa Hippo, MD;  Location: [facility];  Service: Endoscopy;  Laterality: N/A;  255  . LEFT HEART CATHETERIZATION WITH CORONARY ANGIOGRAM N/A 11/27/2011   Procedure: LEFT HEART CATHETERIZATION WITH CORONARY ANGIOGRAM;  Surgeon: Marykay Lex, MD;  Location: [facility] CATH [facility];  Service: Cardiovascular;  Laterality: N/A;  . PERCUTANEOUS CORONARY STENT INTERVENTION (PCI-S)  1999   Mid RCA - NIR DMS 3.0 mm x 30 mm, dRCA Vision BMS 2.5 mm x 15 mm   . TRANSTHORACIC ECHOCARDIOGRAM  10/2018   EF 60-65%. Gr 2 DD.  No RWMA.  Mild-Mod AS  (mean gradient 19 mmHg). Mild-Mod PAH.  Marland Kitchen TRANSTHORACIC ECHOCARDIOGRAM  03/01/2020   EF 70  to 75%.  No or WMA.  Mild LVH.  GRII DD with severe LA enlargement.  Mildly elevated PAP.  Mild MR.  Moderate AS (mean gradient 17 mmHg) with dimensionless index of 0.32.  . TUBAL LIGATION      Immunization History  Administered Date(s) Administered  . Moderna SARS-COVID-2 Vaccination 02/15/2020, 03/14/2020  -> Is planning to schedule her booster shot once the Moderna vaccine booster is approved  MEDICATIONS/ALLERGIES   Current Meds  Medication Sig  . acetaminophen (TYLENOL) 500 MG tablet Take 1,000 mg by mouth 2 (two) times daily as needed (pain.).  Marland Kitchen apixaban (ELIQUIS) 2.5 MG TABS tablet Take 1 tablet (2.5 mg total) by mouth 2 (two) times daily.  Marland Kitchen atorvastatin (LIPITOR) 20 MG tablet Take 20 mg by mouth at bedtime.   . benazepril (LOTENSIN) 40 MG tablet Take 1 tablet (40 mg total) by mouth daily. (Patient taking differently: Take 30 mg by mouth in the morning. )  . carvedilol (COREG) 25 MG tablet Take 1 tablet (25 mg total) by mouth 2 (two) times daily.  . diclofenac Sodium (VOLTAREN) 1 % GEL Apply 2 g topically daily as needed (for pain).   . furosemide (LASIX) 40 MG tablet Take 40 mg by mouth daily as needed for fluid.   . hydrALAZINE (APRESOLINE) 100 MG tablet TAKE ONE TABLET BY MOUTH TWICE DAILY (Patient taking differently: Take 100 mg by mouth 2 (two) times daily. )  . NITROSTAT 0.4 MG SL tablet DISSOLVE 1 TAB UNDER TOUNGE FOR CHEST PAIN. MAY REPEAT EVERY 5 MINUTES FOR 3 DOSES. IF NO RELIEF CALL 911 OR GO TO ER (Patient taking differently: Place 0.4 mg under the tongue every 5 (five) minutes as needed for chest pain. )  . traMADol (ULTRAM) 50 MG tablet Take 50 mg by mouth every 6 (six) hours.     Allergies  Allergen Reactions  . Valium Nausea And Vomiting    SOCIAL HISTORY/FAMILY HISTORY   Reviewed in Epic:  Pertinent findings: Under lots of stress with her husband's health.  He had recent back surgery and now at baseline can barely move.  She has not been just benefiting for  him.  The back surgery went well, the nerve damage has left him minimally mobile.  He now requires significant amount of care.  OBJCTIVE -PE, EKG, labs   Wt Readings from Last 3 Encounters:  10/11/20 128 lb 9.6 oz (58.3 kg)  09/10/20 127 lb 9.6 oz (57.9 kg)  03/01/20 128  lb 3.2 oz (58.2 kg)    Physical Exam: BP (!) 154/94   Pulse 86   Ht 5' 5.5" (1.664 m)   Wt 128 lb 9.6 oz (58.3 kg)   BMI 21.07 kg/m  Physical Exam Vitals reviewed.  Constitutional:      General: She is not in acute distress.    Appearance: Normal appearance. She is normal weight. She is not ill-appearing (Well-groomed.  Healthy) or toxic-appearing.  HENT:     Head: Normocephalic and atraumatic.  Neck:     Vascular: No carotid bruit (Radiated aortic valve murmur), hepatojugular reflux or JVD.  Cardiovascular:     Rate and Rhythm: Normal rate. Rhythm irregularly irregular.     Chest Wall: PMI is not displaced.     Pulses: Decreased pulses (Mildly diminished pedal pulses).     Heart sounds: Murmur heard.  Harsh crescendo-decrescendo midsystolic murmur is present with a grade of 3/6 at the upper right sternal border radiating to the neck. High-pitched blowing holosystolic murmur of grade 2/6 is also present at the apex radiating to the axilla.  No friction rub. No gallop.   Pulmonary:     Effort: Pulmonary effort is normal. No respiratory distress.     Breath sounds: Normal breath sounds.  Chest:     Chest wall: No tenderness.  Musculoskeletal:        General: Normal range of motion.     Cervical back: Normal range of motion and neck supple.  Neurological:     General: No focal deficit present.     Mental Status: She is alert and oriented to person, place, and time. Mental status is at baseline.  Psychiatric:        Mood and Affect: Mood normal.        Behavior: Behavior normal.        Thought Content: Thought content normal.        Judgment: Judgment normal.     Adult ECG Report  Rate: 86 ;  Rhythm:  atrial fibrillation and Otherwise normal axis, intervals and durations.;   Narrative Interpretation: Persistent A. fib  Recent Labs: 07/30/2020: TC 117, TG 114, HDL 49.  (LDL not recorded was 72 in March 2020) Hgb 13.2, Cr 0.62, K+ 4.3. No results found for: CHOL, HDL, LDLCALC, LDLDIRECT, TRIG, CHOLHDL Lab Results  Component Value Date   CREATININE 0.62 09/01/2020   BUN 16 09/01/2020   NA 140 09/01/2020   K 4.3 09/01/2020   CL 100 09/01/2020   CO2 29 09/01/2020   Lab Results  Component Value Date   TSH 2.344 09/04/2011    ASSESSMENT/PLAN    Problem List Items Addressed This Visit    Coronary artery disease involving native coronary artery without angina pectoris (Chronic)    Symptoms.  Distant history of PCI. Plan: On beta-blocker and ACE inhibitor along with statin. Not on aspirin because of Eliquis.--There was issues of GI bleed in the past and therefore aspirin was held.  Would not do both aspirin plus Eliquis.  Need to continue to titrate blood pressure control.      Relevant Orders   EKG 12-Lead (Completed)   CBC   Basic metabolic panel   Hypertensive heart disease with chronic diastolic congestive heart failure (HCC) (Chronic)    Still poor control blood pressure multiple medications.  Now on Lasix standing dose.  We talked about sliding scale.  Low threshold to consider adding Isordil to hydralazine.      Moderate essential hypertension (Chronic)  Blood pressure remains high.  She is on high-dose carvedilol, benazepril and hydralazine.--We have chosen not to use amlodipine because of edema, but she is now on furosemide.  We may need to increase hydralazine dose to either 100 mg 3 times daily or 150 mg twice daily.  Given her advanced age, and lack of symptoms I am reluctant to really be overly aggressive.  For the most part her pressures are in the 140s to 150s.  I have asked that she monitor pressures to see if we need to do any additional treatment.       Hyperlipidemia associated with type 2 diabetes mellitus (HCC) (Chronic)    Lipids have been pretty well controlled on current dose of atorvastatin.  Continue to monitor.  Her PCP is checking labs as I do not have recent lab values.      Aortic stenosis, moderate (Chronic)    She not having any symptoms to suggest symptomatic aortic stenosis.  Will be due to recheck in March of next year.  The gradients do not seem to have changed much.  Despite this, it was read as moderate to severe aortic stenosis.  We will need to reassess dimensionless index.      Relevant Orders   EKG 12-Lead (Completed)   CBC   Basic metabolic panel   Non-ST elevation MI (NSTEMI) (HCC) (Chronic)    Distant history of non-STEMI and unstable angina has had PCI of the RCA.  Both were bare-metal stents.  No longer on antiplatelet agent.  No longer having any anginal pain.  Normal EF.  Hyperdynamic LV function.  No R WMA.      Persistent atrial fibrillation (HCC) - Primary (Chronic)    She remains in A. fib.  I am not sure if she is going in and out of it or simply saying it.  She has been on Eliquis now for almost a month.  We talked about options and I think is not unreasonable to try cardioversion at least once.  She is not all that excited about the concept because she feels no symptoms being in A. fib. Isolated were not successful converting her I would not try antiarrhythmic agents unless she becomes symptomatic.  She has rate controlled on current dose of beta-blocker.  This patients CHA2DS2-VASc Score and unadjusted Ischemic Stroke Rate (% per year) is equal to 7.2 % stroke rate/year from a score of 5  Above score calculated as 1 point each if present [CHF, HTN, DM, Vascular=MI/PAD/Aortic Plaque, Age if 65-74, or Female] Above score calculated as 2 points each if present [Age > 75, or Stroke/TIA/TE] . Plan: Schedule cardioversion.       Relevant Orders   EKG 12-Lead (Completed)   CBC   Basic metabolic panel         COVID-19 Education: The signs and symptoms of COVID-19 were discussed with the patient and how to seek care for testing (follow up with PCP or arrange E-visit).   The importance of social distancing and COVID-19 vaccination was discussed today. 1 min The patient is practicing social distancing & Masking.   I spent a total of with the patient spent in direct patient consultation.  Additional time spent with chart review  / charting (studies, outside notes, etc): 12 Total Time: 42 min  Current medicines are reviewed at length with the patient today.  (+/- concerns) n/a  This visit occurred during the SARS-CoV-2 public health emergency.  Safety protocols were in place, including screening  questions prior to the visit, additional usage of staff PPE, and extensive cleaning of exam room while observing appropriate contact time as indicated for disinfecting solutions.  Notice: This dictation was prepared with Dragon dictation along with smaller phrase technology. Any transcriptional errors that result from this process are unintentional and may not be corrected upon review.  Patient Instructions / Medication Changes & Studies & Tests Ordered   Patient Instructions  Medication Instructions:  NO CHANGE WITH MEDICATION   *If you need a refill on your cardiac medications before your next appointment, please call your pharmacy*   Lab Work: SEE INSTRUCTION SHEET   If you have labs (blood work) drawn today and your tests are completely normal, you will receive your results only by: Marland Kitchen. MyChart Message (if you have MyChart) OR . A paper copy in the mail If you have any lab test that is abnormal or we need to change your treatment, we will call you to review the results.   Testing/Procedures: Schedule an Cardioversion -- Oct 24 2020 [facility]  Your physician has recommended that you have a Cardioversion (DCCV). Electrical Cardioversion uses a jolt of electricity to your  heart either through paddles or wired patches attached to your chest. This is a controlled, usually prescheduled, procedure. Defibrillation is done under light anesthesia in the hospital, and you usually go home the day of the procedure. This is done to get your heart back into a normal rhythm. You are not awake for the procedure. Please see the instruction sheet given to you today.     Follow-Up: At [facility], you and your health needs are our priority.  As part of our continuing mission to provide you with exceptional heart care, we have created designated Provider Care Teams.  These Care Teams include your primary Cardiologist (physician) and Advanced Practice Providers (APPs -  Physician Assistants and Nurse Practitioners) who all work together to provide you with the care you need, when you need it.  We recommend signing up for the patient portal called "MyChart".  Sign up information is provided on this After Visit Summary.  MyChart is used to connect with patients for Virtual Visits (Telemedicine).  Patients are able to view lab/test results, encounter notes, upcoming appointments, etc.  Non-urgent messages can be sent to your provider as well.   To learn more about what you can do with MyChart, go to [email or web address].    Your next appointment:   3 month(s)  The format for your next appointment:   In Person  Provider:   Bryan Lemmaavid Marializ Ferrebee, MD   Other Instructions Your physician recommends that you schedule a follow-up appointment in:  2 weeks after cardioversion - needs [facility] [place] office  ( week of Nov 17 ,2021)    Dear  [name] You are scheduled for a Cardioversion on Oct 24, 2020 with Dr. Anne FuSkains.  Please arrive at the [facility] ([facility] En[facility]ance A[facility]: [address] [place], [place] [address] at 11 am.    DIET: Nothing to eat or drink after midnight except a sip of water with medications (see medication instructions  below)  Medication Instructions:   Continue your anticoagulant: Eliquis  You will need to continue your anticoagulant after your procedure until you are told by your  Provider that it is safe to stop   Labs: BMP, CBC  Week of OCT 25  - you can go  To [facility] [place]  or [place]     Covid testing to be done Oct 30 , 2021 Saturday at 12 noon   Go to [address] [place] , [place]    You must have a responsible person to drive you home and stay in the waiting area during your procedure. Failure to do so could result in cancellation.  Bring your insurance cards.  *Special Note: Every effort is made to have your procedure done on time. Occasionally there are emergencies that occur at the hospital that may cause delays. Please be patient if a delay does occur.     Studies Ordered:   Orders Placed This Encounter  Procedures  . CBC  . Basic metabolic panel  . EKG 12-Lead     Bryan Lemma, M.D., M.S. Interventional Cardiologist   Pager # [phone] Phone # [phone] [address] [place], [place] [address]   Thank you for choosing Heartcare at [facility]!!

## 2020-10-11 NOTE — Progress Notes (Signed)
 Primary Care Provider: Sasser, Paul W, MD Cardiologist: Bridie Colquhoun, MD Electrophysiologist: None  Clinic Note: Chief Complaint  Patient presents with  . Follow-up    1 month  . Atrial Fibrillation    My back and the fib noted in ER visit.  . Coronary Artery Disease    No angina  . Hypertension    Fluctuating blood pressures.    HPI:    [name] is a 84 y.o. female with a PMH notable for CAD (inferior non-STEMI-PCI), MODERATE AORTIC STENOSIS, and recurrence of PERSISTENT ATRIAL FIBRILLATION who presents today for 1 month follow-up.  Problem List Items Addressed This Visit    Coronary artery disease involving native coronary artery without angina pectoris (Chronic)  NSTEMI 1999-BMS mid RCA  Unstable angina December 2002: PTCA of RCA ISR  Last cath September 2012: BMS distal RCA   Hypertensive heart disease with chronic diastolic congestive heart failure (HCC) (Chronic)  Difficult control hypertension   Moderate essential hypertension (Chronic) -> multiple medications.   Hyperlipidemia associated with type 2 diabetes mellitus (HCC) (Chronic)   Aortic stenosis, moderate - Primary (Chronic)  Most recent echo 03/01/2020: Read as moderate-severe AS, but gradients do not match that.   Persistent atrial fibrillation (HCC) -> recent recurrence      [name] was last seen by me in clinic on November 21, 2020 -> she was.  Was getting frustrated about following her blood pressures at home.  Apparently was being called by her insurance company about high blood pressures but nothing was forwarded to either her PCP or me.  Blood pressures were anywhere from the 140s to 160s systolic.  She had just been increased to 30 mg benazepril.  No angina.  No heart failure.  Recent Hospitalizations:   3/10-11/2020: Admitted with chest pain.  Chest pain woke her up at 3 AM.  Negative troponin levels.  Echo ordered, reviewed.  Had mild AKI which resolved.  Noted that she been  undergoing lots of stress related to her husband's health.  09/01/2020-ER: Presented with left back and lateral chest pain for 2 to 3 days.  Acute onset.  Unlike anginal pain.  Was noted to be in rate controlled A. fib on EKG.  Started on 2.5 mg twice daily Eliquis; pain attributed to A. fib  [name] was then seen in hospital/ER follow-up by Hao Meng, PA on September 10, 2020-noted compliance with Eliquis.  Carvedilol titrated up to 25 mg twice daily for better rate control.  Plan was to consider cardioversion in roughly 4 weeks.  Reviewed  CV studies:    The following studies were reviewed today: (if available, images/films reviewed: From Epic Chart or Care Everywhere) . Transthoracic Echo 03/01/2020: EF 70 to 75%.  No or WMA.  Mild LVH.  GRII DD with severe LA enlargement.  Mildly elevated PAP.  Mild MR.  Moderate AS (mean gradient 17 mmHg) with dimensionless index of 0.32.   Interval History:   [name] returns here today for follow-up actually feeling pretty well.  She had about 2 days in a row and then 1 more additional day where she felt some dizziness since her last visit, but otherwise has not really noted any more chest discomfort.  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  She is on her quite a bit of stress and thinks that that may actually do with her high blood pressures.    She is not having any angina or heart failure symptoms, in fact she does not notice being in A. fib or not.  The only time she feels that he being irregular is when she is exerting herself somewhat and feels her heart rate going up.  No bleeding issues on Eliquis.  CV Review of Symptoms (Summary): positive for - irregular heartbeat and Only noticed this when her heart rate goes up with exertion.  Maybe some mild fatigue and exertional dyspnea if she does a lot, but not much difference that she can tell. negative for - chest pain, edema,  orthopnea, paroxysmal nocturnal dyspnea, rapid heart rate, shortness of breath or Syncope/near syncope or TIA/amaurosis fugax.  Claudication.  The patient does not have symptoms concerning for COVID-19 infection (fever, chills, cough, or new shortness of breath).   REVIEWED OF SYSTEMS   Review of Systems  Constitutional: Negative for malaise/fatigue and weight loss.  HENT: Negative for congestion and nosebleeds.   Respiratory: Negative for shortness of breath.   Gastrointestinal: Negative for blood in stool and melena.  Genitourinary: Negative for hematuria.  Musculoskeletal: Positive for back pain and joint pain. Negative for falls.  Neurological: Positive for dizziness (Occasional dizziness when her blood pressures go up.  He has had total of 3 days where she has had dizzy spells since the ER visit). Negative for focal weakness, seizures and weakness.  Endo/Heme/Allergies: Negative for environmental allergies. Does not bruise/bleed easily.  Psychiatric/Behavioral: Negative for depression and memory loss. The patient is nervous/anxious. The patient does not have insomnia (She does not sleep well mostly because she cannot take care of him.).        Lots of social stress.  Husband is now almost total care following his back surgery.   I have reviewed and (if needed) personally updated the patient's problem list, medications, allergies, past medical and surgical history, social and family history.   PAST MEDICAL HISTORY   Past Medical History:  Diagnosis Date  . CAD S/P percutaneous coronary angioplasty 1990s, 11/28/2011    a) midRCA PCI 1999 (NIR BMS 3.0 mm x 32 mm); distal  RCA BMS Sept 2012 (Vision BMS 2.5 mm x 15 mm); 12/02: RCA ISR --> Cutting PTCA   . Dyslipidemia, goal LDL below 70 11/28/2011  . Essential hypertension    On ACE inhibitor and ARB? As well as atenolol and hydralazine  . Gastric ulcer 11/2018  . GERD (gastroesophageal reflux disease)   . History of blood transfusion      without reaction  . Moderate aortic stenosis by prior echocardiogram 08/2011; 08/2013   Echo 11/09/18: EF 60-65%. Gr 2 DD.  No RWMA.  Mild-Mod AS (mean gradient 19 mmHg). Mild-Mod PAH. ->  Follow-up echo March 2021 showed mean gradient 17 mmHg (dimensionless index 0.32)-this was reported as moderate-severe AS  . Myocardial infarction (HCC) 1990s   Had PTCA then PCI on RCA; 11/2011--> Dyspnea and Shoulder pain prior to adm   . Type 2 diabetes mellitus with complication, without long-term current use of insulin (HCC)    no longer taking diabetic medication - diet controlled    PAST SURGICAL HISTORY   Past Surgical History:  Procedure Laterality Date  . ABDOMINAL HYSTERECTOMY    . BIOPSY  06/03/2018   Procedure: BIOPSY;  Surgeon: Rehman, Najeeb U, MD;  Location: [facility];  Service: Endoscopy;;  gastric   . BREAST BIOPSY    . CARDIAC CATHETERIZATION  2012   performed for angina  . CARDIAC CATHETERIZATION    .   CORONARY ANGIOPLASTY  11/2006   Cutting Balloon of stent in the RCA  . ESOPHAGOGASTRODUODENOSCOPY N/A 06/03/2018   Procedure: ESOPHAGOGASTRODUODENOSCOPY (EGD);  Surgeon: Rehman, Najeeb U, MD;  Location: [facility];  Service: Endoscopy;  Laterality: N/A;  1:40  . ESOPHAGOGASTRODUODENOSCOPY N/A 12/02/2018   Procedure: ESOPHAGOGASTRODUODENOSCOPY (EGD);  Surgeon: Rehman, Najeeb U, MD;  Location: [facility];  Service: Endoscopy;  Laterality: N/A;  255  . LEFT HEART CATHETERIZATION WITH CORONARY ANGIOGRAM N/A 11/27/2011   Procedure: LEFT HEART CATHETERIZATION WITH CORONARY ANGIOGRAM;  Surgeon: Shan Valdes W Kismet Facemire, MD;  Location: [facility];  Service: Cardiovascular;  Laterality: N/A;  . PERCUTANEOUS CORONARY STENT INTERVENTION (PCI-S)  1999   Mid RCA - NIR DMS 3.0 mm x 30 mm, dRCA Vision BMS 2.5 mm x 15 mm   . TRANSTHORACIC ECHOCARDIOGRAM  10/2018   EF 60-65%. Gr 2 DD.  No RWMA.  Mild-Mod AS  (mean gradient 19 mmHg). Mild-Mod PAH.  . TRANSTHORACIC ECHOCARDIOGRAM  03/01/2020   EF 70  to 75%.  No or WMA.  Mild LVH.  GRII DD with severe LA enlargement.  Mildly elevated PAP.  Mild MR.  Moderate AS (mean gradient 17 mmHg) with dimensionless index of 0.32.  . TUBAL LIGATION      Immunization History  Administered Date(s) Administered  . Moderna SARS-COVID-2 Vaccination 02/15/2020, 03/14/2020  -> Is planning to schedule her booster shot once the Moderna vaccine booster is approved  MEDICATIONS/ALLERGIES   Current Meds  Medication Sig  . acetaminophen (TYLENOL) 500 MG tablet Take 1,000 mg by mouth 2 (two) times daily as needed (pain.).  . apixaban (ELIQUIS) 2.5 MG TABS tablet Take 1 tablet (2.5 mg total) by mouth 2 (two) times daily.  . atorvastatin (LIPITOR) 20 MG tablet Take 20 mg by mouth at bedtime.   . benazepril (LOTENSIN) 40 MG tablet Take 1 tablet (40 mg total) by mouth daily. (Patient taking differently: Take 30 mg by mouth in the morning. )  . carvedilol (COREG) 25 MG tablet Take 1 tablet (25 mg total) by mouth 2 (two) times daily.  . diclofenac Sodium (VOLTAREN) 1 % GEL Apply 2 g topically daily as needed (for pain).   . furosemide (LASIX) 40 MG tablet Take 40 mg by mouth daily as needed for fluid.   . hydrALAZINE (APRESOLINE) 100 MG tablet TAKE ONE TABLET BY MOUTH TWICE DAILY (Patient taking differently: Take 100 mg by mouth 2 (two) times daily. )  . NITROSTAT 0.4 MG SL tablet DISSOLVE 1 TAB UNDER TOUNGE FOR CHEST PAIN. MAY REPEAT EVERY 5 MINUTES FOR 3 DOSES. IF NO RELIEF CALL 911 OR GO TO ER (Patient taking differently: Place 0.4 mg under the tongue every 5 (five) minutes as needed for chest pain. )  . traMADol (ULTRAM) 50 MG tablet Take 50 mg by mouth every 6 (six) hours.     Allergies  Allergen Reactions  . Valium Nausea And Vomiting    SOCIAL HISTORY/FAMILY HISTORY   Reviewed in Epic:  Pertinent findings: Under lots of stress with her husband's health.  He had recent back surgery and now at baseline can barely move.  She has not been just benefiting for  him.  The back surgery went well, the nerve damage has left him minimally mobile.  He now requires significant amount of care.  OBJCTIVE -PE, EKG, labs   Wt Readings from Last 3 Encounters:  10/11/20 128 lb 9.6 oz (58.3 kg)  09/10/20 127 lb 9.6 oz (57.9 kg)  03/01/20 128   lb 3.2 oz (58.2 kg)    Physical Exam: BP (!) 154/94   Pulse 86   Ht 5' 5.5" (1.664 m)   Wt 128 lb 9.6 oz (58.3 kg)   BMI 21.07 kg/m  Physical Exam Vitals reviewed.  Constitutional:      General: She is not in acute distress.    Appearance: Normal appearance. She is normal weight. She is not ill-appearing (Well-groomed.  Healthy) or toxic-appearing.  HENT:     Head: Normocephalic and atraumatic.  Neck:     Vascular: No carotid bruit (Radiated aortic valve murmur), hepatojugular reflux or JVD.  Cardiovascular:     Rate and Rhythm: Normal rate. Rhythm irregularly irregular.     Chest Wall: PMI is not displaced.     Pulses: Decreased pulses (Mildly diminished pedal pulses).     Heart sounds: Murmur heard.  Harsh crescendo-decrescendo midsystolic murmur is present with a grade of 3/6 at the upper right sternal border radiating to the neck. High-pitched blowing holosystolic murmur of grade 2/6 is also present at the apex radiating to the axilla.  No friction rub. No gallop.   Pulmonary:     Effort: Pulmonary effort is normal. No respiratory distress.     Breath sounds: Normal breath sounds.  Chest:     Chest wall: No tenderness.  Musculoskeletal:        General: Normal range of motion.     Cervical back: Normal range of motion and neck supple.  Neurological:     General: No focal deficit present.     Mental Status: She is alert and oriented to person, place, and time. Mental status is at baseline.  Psychiatric:        Mood and Affect: Mood normal.        Behavior: Behavior normal.        Thought Content: Thought content normal.        Judgment: Judgment normal.     Adult ECG Report  Rate: 86 ;  Rhythm:  atrial fibrillation and Otherwise normal axis, intervals and durations.;   Narrative Interpretation: Persistent A. fib  Recent Labs: 07/30/2020: TC 117, TG 114, HDL 49.  (LDL not recorded was 72 in March 2020) Hgb 13.2, Cr 0.62, K+ 4.3. No results found for: CHOL, HDL, LDLCALC, LDLDIRECT, TRIG, CHOLHDL Lab Results  Component Value Date   CREATININE 0.62 09/01/2020   BUN 16 09/01/2020   NA 140 09/01/2020   K 4.3 09/01/2020   CL 100 09/01/2020   CO2 29 09/01/2020   Lab Results  Component Value Date   TSH 2.344 09/04/2011    ASSESSMENT/PLAN    Problem List Items Addressed This Visit    Coronary artery disease involving native coronary artery without angina pectoris (Chronic)    Symptoms.  Distant history of PCI. Plan: On beta-blocker and ACE inhibitor along with statin. Not on aspirin because of Eliquis.--There was issues of GI bleed in the past and therefore aspirin was held.  Would not do both aspirin plus Eliquis.  Need to continue to titrate blood pressure control.      Relevant Orders   EKG 12-Lead (Completed)   CBC   Basic metabolic panel   Hypertensive heart disease with chronic diastolic congestive heart failure (HCC) (Chronic)    Still poor control blood pressure multiple medications.  Now on Lasix standing dose.  We talked about sliding scale.  Low threshold to consider adding Isordil to hydralazine.      Moderate essential hypertension (Chronic)      Blood pressure remains high.  She is on high-dose carvedilol, benazepril and hydralazine.--We have chosen not to use amlodipine because of edema, but she is now on furosemide.  We may need to increase hydralazine dose to either 100 mg 3 times daily or 150 mg twice daily.  Given her advanced age, and lack of symptoms I am reluctant to really be overly aggressive.  For the most part her pressures are in the 140s to 150s.  I have asked that she monitor pressures to see if we need to do any additional treatment.       Hyperlipidemia associated with type 2 diabetes mellitus (HCC) (Chronic)    Lipids have been pretty well controlled on current dose of atorvastatin.  Continue to monitor.  Her PCP is checking labs as I do not have recent lab values.      Aortic stenosis, moderate (Chronic)    She not having any symptoms to suggest symptomatic aortic stenosis.  Will be due to recheck in March of next year.  The gradients do not seem to have changed much.  Despite this, it was read as moderate to severe aortic stenosis.  We will need to reassess dimensionless index.      Relevant Orders   EKG 12-Lead (Completed)   CBC   Basic metabolic panel   Non-ST elevation MI (NSTEMI) (HCC) (Chronic)    Distant history of non-STEMI and unstable angina has had PCI of the RCA.  Both were bare-metal stents.  No longer on antiplatelet agent.  No longer having any anginal pain.  Normal EF.  Hyperdynamic LV function.  No R WMA.      Persistent atrial fibrillation (HCC) - Primary (Chronic)    She remains in A. fib.  I am not sure if she is going in and out of it or simply saying it.  She has been on Eliquis now for almost a month.  We talked about options and I think is not unreasonable to try cardioversion at least once.  She is not all that excited about the concept because she feels no symptoms being in A. fib. Isolated were not successful converting her I would not try antiarrhythmic agents unless she becomes symptomatic.  She has rate controlled on current dose of beta-blocker.  This patients CHA2DS2-VASc Score and unadjusted Ischemic Stroke Rate (% per year) is equal to 7.2 % stroke rate/year from a score of 5  Above score calculated as 1 point each if present [CHF, HTN, DM, Vascular=MI/PAD/Aortic Plaque, Age if 65-74, or Female] Above score calculated as 2 points each if present [Age > 75, or Stroke/TIA/TE] . Plan: Schedule cardioversion.       Relevant Orders   EKG 12-Lead (Completed)   CBC   Basic metabolic panel         COVID-19 Education: The signs and symptoms of COVID-19 were discussed with the patient and how to seek care for testing (follow up with PCP or arrange E-visit).   The importance of social distancing and COVID-19 vaccination was discussed today. 1 min The patient is practicing social distancing & Masking.   I spent a total of 30minutes with the patient spent in direct patient consultation.  Additional time spent with chart review  / charting (studies, outside notes, etc): 12 Total Time: 42 min  Current medicines are reviewed at length with the patient today.  (+/- concerns) n/a  This visit occurred during the SARS-CoV-2 public health emergency.  Safety protocols were in place, including screening   questions prior to the visit, additional usage of staff PPE, and extensive cleaning of exam room while observing appropriate contact time as indicated for disinfecting solutions.  Notice: This dictation was prepared with Dragon dictation along with smaller phrase technology. Any transcriptional errors that result from this process are unintentional and may not be corrected upon review.  Patient Instructions / Medication Changes & Studies & Tests Ordered   Patient Instructions  Medication Instructions:  NO CHANGE WITH MEDICATION   *If you need a refill on your cardiac medications before your next appointment, please call your pharmacy*   Lab Work: SEE INSTRUCTION SHEET   If you have labs (blood work) drawn today and your tests are completely normal, you will receive your results only by: . MyChart Message (if you have MyChart) OR . A paper copy in the mail If you have any lab test that is abnormal or we need to change your treatment, we will call you to review the results.   Testing/Procedures: Schedule an Cardioversion -- Oct 24 2020 [facility] [place]  Your physician has recommended that you have a Cardioversion (DCCV). Electrical Cardioversion uses a jolt of electricity to your  heart either through paddles or wired patches attached to your chest. This is a controlled, usually prescheduled, procedure. Defibrillation is done under light anesthesia in the hospital, and you usually go home the day of the procedure. This is done to get your heart back into a normal rhythm. You are not awake for the procedure. Please see the instruction sheet given to you today.     Follow-Up: At [facility], you and your health needs are our priority.  As part of our continuing mission to provide you with exceptional heart care, we have created designated Provider Care Teams.  These Care Teams include your primary Cardiologist (physician) and Advanced Practice Providers (APPs -  Physician Assistants and Nurse Practitioners) who all work together to provide you with the care you need, when you need it.  We recommend signing up for the patient portal called "MyChart".  Sign up information is provided on this After Visit Summary.  MyChart is used to connect with patients for Virtual Visits (Telemedicine).  Patients are able to view lab/test results, encounter notes, upcoming appointments, etc.  Non-urgent messages can be sent to your provider as well.   To learn more about what you can do with MyChart, go to [email or web address].    Your next appointment:   3 month(s)  The format for your next appointment:   In Person  Provider:   Keigo Whalley, MD   Other Instructions Your physician recommends that you schedule a follow-up appointment in:  2 weeks after cardioversion - needs an EKG at the [place] or [place] office  ( week of Nov 17 ,2021)    Dear  [name] You are scheduled for a Cardioversion on Oct 24, 2020 with Dr. Skains.  Please arrive at the [facility] (Main Entrance A) at [facility]: [address] [place], [place] [address] at 11 am.    DIET: Nothing to eat or drink after midnight except a sip of water with medications (see medication instructions  below)  Medication Instructions:   Continue your anticoagulant: Eliquis  You will need to continue your anticoagulant after your procedure until you are told by your  Provider that it is safe to stop   Labs: BMP, CBC  Week of OCT 25  - you can go  To [facility] [place]   or [place]     Covid testing to be done Oct 30 , 2021 Saturday at 12 noon   Go to [address] , [place]    You must have a responsible person to drive you home and stay in the waiting area during your procedure. Failure to do so could result in cancellation.  Bring your insurance cards.  *Special Note: Every effort is made to have your procedure done on time. Occasionally there are emergencies that occur at the hospital that may cause delays. Please be patient if a delay does occur.     Studies Ordered:   Orders Placed This Encounter  Procedures  . CBC  . Basic metabolic panel  . EKG 12-Lead     [name], M.D., M.S. Interventional Cardiologist   Pager # [phone] Phone # [phone] [address] [place], [place] [address]   Thank you for choosing [facility]!!    

## 2020-10-16 ENCOUNTER — Other Ambulatory Visit: Payer: Self-pay | Admitting: *Deleted

## 2020-10-16 ENCOUNTER — Encounter: Payer: Self-pay | Admitting: Cardiology

## 2020-10-16 NOTE — Assessment & Plan Note (Signed)
Symptoms.  Distant history of PCI. Plan: On beta-blocker and ACE inhibitor along with statin. Not on aspirin because of Eliquis.--There was issues of GI bleed in the past and therefore aspirin was held.  Would not do both aspirin plus Eliquis.  Need to continue to titrate blood pressure control.

## 2020-10-16 NOTE — Assessment & Plan Note (Signed)
Distant history of non-STEMI and unstable angina has had PCI of the RCA.  Both were bare-metal stents.  No longer on antiplatelet agent.  No longer having any anginal pain.  Normal EF.  Hyperdynamic LV function.  No R WMA.

## 2020-10-16 NOTE — Assessment & Plan Note (Addendum)
She remains in A. fib.  I am not sure if she is going in and out of it or simply saying it.  She has been on Eliquis now for almost a month.  We talked about options and I think is not unreasonable to try cardioversion at least once.  She is not all that excited about the concept because she feels no symptoms being in A. fib. Isolated were not successful converting her I would not try antiarrhythmic agents unless she becomes symptomatic.  She has rate controlled on current dose of beta-blocker.  This patients CHA2DS2-VASc Score and unadjusted Ischemic Stroke Rate (% per year) is equal to 7.2 % stroke rate/year from a score of 5  Above score calculated as 1 point each if present [CHF, HTN, DM, Vascular=MI/PAD/Aortic Plaque, Age if 65-74, or Female] Above score calculated as 2 points each if present [Age > 75, or Stroke/TIA/TE] . Plan: Schedule cardioversion.

## 2020-10-16 NOTE — Assessment & Plan Note (Signed)
Blood pressure remains high.  She is on high-dose carvedilol, benazepril and hydralazine.--We have chosen not to use amlodipine because of edema, but she is now on furosemide.  We may need to increase hydralazine dose to either 100 mg 3 times daily or 150 mg twice daily.  Given her advanced age, and lack of symptoms I am reluctant to really be overly aggressive.  For the most part her pressures are in the 140s to 150s.  I have asked that she monitor pressures to see if we need to do any additional treatment.

## 2020-10-16 NOTE — Assessment & Plan Note (Signed)
She not having any symptoms to suggest symptomatic aortic stenosis.  Will be due to recheck in March of next year.  The gradients do not seem to have changed much.  Despite this, it was read as moderate to severe aortic stenosis.  We will need to reassess dimensionless index.

## 2020-10-16 NOTE — Assessment & Plan Note (Signed)
Lipids have been pretty well controlled on current dose of atorvastatin.  Continue to monitor.  Her PCP is checking labs as I do not have recent lab values.

## 2020-10-16 NOTE — Assessment & Plan Note (Signed)
Still poor control blood pressure multiple medications.  Now on Lasix standing dose.  We talked about sliding scale.  Low threshold to consider adding Isordil to hydralazine.

## 2020-10-19 DIAGNOSIS — E1165 Type 2 diabetes mellitus with hyperglycemia: Secondary | ICD-10-CM | POA: Diagnosis not present

## 2020-10-19 DIAGNOSIS — R5382 Chronic fatigue, unspecified: Secondary | ICD-10-CM | POA: Diagnosis not present

## 2020-10-19 DIAGNOSIS — I10 Essential (primary) hypertension: Secondary | ICD-10-CM | POA: Diagnosis not present

## 2020-10-20 ENCOUNTER — Other Ambulatory Visit (HOSPITAL_COMMUNITY)
Admission: RE | Admit: 2020-10-20 | Discharge: 2020-10-20 | Disposition: A | Discharge status: Home / Self Care | Payer: Medicare Other | Source: Ambulatory Visit | Attending: Cardiology | Admitting: Cardiology

## 2020-10-20 DIAGNOSIS — Z01812 Encounter for preprocedural laboratory examination: Secondary | ICD-10-CM | POA: Insufficient documentation

## 2020-10-20 DIAGNOSIS — Z20822 Contact with and (suspected) exposure to covid-19: Secondary | ICD-10-CM | POA: Insufficient documentation

## 2020-10-21 LAB — SARS CORONAVIRUS 2 (TAT 6-24 HRS): SARS Coronavirus 2: NEGATIVE

## 2020-10-24 ENCOUNTER — Encounter (HOSPITAL_COMMUNITY)
Admission: RE | Disposition: A | Discharge status: Home / Self Care | Payer: Self-pay | Source: Home / Self Care | Attending: Cardiology

## 2020-10-24 ENCOUNTER — Other Ambulatory Visit: Payer: Self-pay

## 2020-10-24 ENCOUNTER — Ambulatory Visit (HOSPITAL_COMMUNITY)
Admission: RE | Admit: 2020-10-24 | Discharge: 2020-10-24 | Disposition: A | Discharge status: Home / Self Care | Payer: Medicare Other | Attending: Cardiology | Admitting: Cardiology

## 2020-10-24 ENCOUNTER — Ambulatory Visit (HOSPITAL_COMMUNITY): Payer: Medicare Other | Admitting: Certified Registered Nurse Anesthetist

## 2020-10-24 ENCOUNTER — Encounter (HOSPITAL_COMMUNITY): Payer: Self-pay | Admitting: Cardiology

## 2020-10-24 DIAGNOSIS — E785 Hyperlipidemia, unspecified: Secondary | ICD-10-CM | POA: Diagnosis not present

## 2020-10-24 DIAGNOSIS — I5032 Chronic diastolic (congestive) heart failure: Secondary | ICD-10-CM | POA: Diagnosis not present

## 2020-10-24 DIAGNOSIS — Z955 Presence of coronary angioplasty implant and graft: Secondary | ICD-10-CM | POA: Insufficient documentation

## 2020-10-24 DIAGNOSIS — I252 Old myocardial infarction: Secondary | ICD-10-CM | POA: Insufficient documentation

## 2020-10-24 DIAGNOSIS — I110 Hypertensive heart disease with heart failure: Secondary | ICD-10-CM | POA: Diagnosis not present

## 2020-10-24 DIAGNOSIS — D62 Acute posthemorrhagic anemia: Secondary | ICD-10-CM | POA: Diagnosis not present

## 2020-10-24 DIAGNOSIS — I350 Nonrheumatic aortic (valve) stenosis: Secondary | ICD-10-CM | POA: Insufficient documentation

## 2020-10-24 DIAGNOSIS — I4819 Other persistent atrial fibrillation: Secondary | ICD-10-CM | POA: Diagnosis not present

## 2020-10-24 DIAGNOSIS — I25110 Atherosclerotic heart disease of native coronary artery with unstable angina pectoris: Secondary | ICD-10-CM | POA: Diagnosis not present

## 2020-10-24 DIAGNOSIS — E119 Type 2 diabetes mellitus without complications: Secondary | ICD-10-CM | POA: Diagnosis not present

## 2020-10-24 HISTORY — PX: CARDIOVERSION: SHX1299

## 2020-10-24 LAB — POCT I-STAT, CHEM 8
BUN: 11 mg/dL (ref 8–23)
Calcium, Ion: 1.19 mmol/L (ref 1.15–1.40)
Chloride: 103 mmol/L (ref 98–111)
Creatinine, Ser: 0.6 mg/dL (ref 0.44–1.00)
Glucose, Bld: 140 mg/dL — ABNORMAL HIGH (ref 70–99)
HCT: 37 % (ref 36.0–46.0)
Hemoglobin: 12.6 g/dL (ref 12.0–15.0)
Potassium: 3.7 mmol/L (ref 3.5–5.1)
Sodium: 141 mmol/L (ref 135–145)
TCO2: 29 mmol/L (ref 22–32)

## 2020-10-24 SURGERY — CARDIOVERSION
Anesthesia: General

## 2020-10-24 MED ORDER — SODIUM CHLORIDE 0.9 % IV SOLN: INTRAVENOUS | Status: DC | PRN

## 2020-10-24 MED ORDER — PROPOFOL 10 MG/ML IV BOLUS
INTRAVENOUS | Status: DC | PRN
  Administered 2020-10-24: 50 mg via INTRAVENOUS

## 2020-10-24 MED ORDER — LIDOCAINE 2% (20 MG/ML) 5 ML SYRINGE
INTRAMUSCULAR | Status: DC | PRN
  Administered 2020-10-24: 40 mg via INTRAVENOUS

## 2020-10-24 NOTE — Anesthesia Postprocedure Evaluation (Signed)
Anesthesia Post Note  Patient: [name]  Procedure(s) Performed: CARDIOVERSION (N/A )     Patient location during evaluation: PACU Anesthesia Type: General Level of consciousness: awake and alert Pain management: pain level controlled Vital Signs Assessment: post-procedure vital signs reviewed and stable Respiratory status: spontaneous breathing, nonlabored ventilation, respiratory function stable and patient connected to nasal cannula oxygen Cardiovascular status: blood pressure returned to baseline and stable Postop Assessment: no apparent nausea or vomiting Anesthetic complications: no   No complications documented.  Last Vitals:  Vitals:   10/24/20 1240 10/24/20 1250  BP: (!) 186/85 (!) 179/65  Pulse: (!) 106 65  Resp: 11 10  Temp:    SpO2: 98% 99%    Last Pain:  Vitals:   10/24/20 1250  TempSrc:   PainSc: 0-No pain                 Shelton Silvas

## 2020-10-24 NOTE — Interval H&P Note (Signed)
History and Physical Interval Note:  10/24/2020 11:49 AM  [name]  has presented today for surgery, with the diagnosis of AFIB.  The various methods of treatment have been discussed with the patient and family. After consideration of risks, benefits and other options for treatment, the patient has consented to  Procedure(s): CARDIOVERSION (N/A) as a surgical intervention.  The patient's history has been reviewed, patient examined, no change in status, stable for surgery.  I have reviewed the patient's chart and labs.  Questions were answered to the patient's satisfaction.     [employer]

## 2020-10-24 NOTE — Progress Notes (Signed)
        She did show NSR with PAC's post cardioversion. Seems to be in and out of this and atrial tachy, flutter.   Donato Schultz, MD

## 2020-10-24 NOTE — CV Procedure (Signed)
    Electrical Cardioversion Procedure Note [name] [id number] [birth year]  Procedure: Electrical Cardioversion Indications:  Atrial Fibrillation  Time Out: Verified patient identification, verified procedure,medications/allergies/relevent history reviewed, required imaging and test results available.  Performed  Procedure Details  The patient was NPO after midnight. Anesthesia was administered at the beside  by Dr.Hollis with propofol.  Cardioversion was performed with synchronized biphasic defibrillation via AP pads with 120, 200, 200 joules.  3 attempt(s) were performed.  The patient converted to normal sinus rhythm. The patient tolerated the procedure well   IMPRESSION:  Brief P waves noted but overall reverted back to AFIB.    Donato Schultz 10/24/2020, 12:28 PM

## 2020-10-24 NOTE — Anesthesia Procedure Notes (Signed)
Procedure Name: General with mask airway Date/Time: 10/24/2020 12:22 PM Performed by: Quentin Ore, CRNA Pre-anesthesia Checklist: Patient identified, Emergency Drugs available, Suction available, Patient being monitored and Timeout performed Patient Re-evaluated:Patient Re-evaluated prior to induction Oxygen Delivery Method: Ambu bag Preoxygenation: Pre-oxygenation with 100% oxygen Induction Type: IV induction Ventilation: Mask ventilation without difficulty Placement Confirmation: positive ETCO2 Dental Injury: Teeth and Oropharynx as per pre-operative assessment

## 2020-10-24 NOTE — Anesthesia Preprocedure Evaluation (Addendum)
Anesthesia Evaluation  Patient identified by MRN, date of birth, ID band Patient awake    Reviewed: Allergy & Precautions, NPO status , Patient's Chart, lab work & pertinent test results  Airway Mallampati: I  TM Distance: >3 FB Neck ROM: Full    Dental  (+) Teeth Intact, Dental Advisory Given   Pulmonary former smoker,    breath sounds clear to auscultation       Cardiovascular hypertension, Pt. on medications and Pt. on home beta blockers + CAD, + Cardiac Stents and +CHF   Rhythm:Irregular Rate:Abnormal     Neuro/Psych negative neurological ROS  negative psych ROS   GI/Hepatic Neg liver ROS, PUD, GERD  ,  Endo/Other  diabetes  Renal/GU      Musculoskeletal negative musculoskeletal ROS (+)   Abdominal Normal abdominal exam  (+)   Peds  Hematology negative hematology ROS (+)   Anesthesia Other Findings   Reproductive/Obstetrics                            Anesthesia Physical Anesthesia Plan  ASA: III  Anesthesia Plan: General   Post-op Pain Management:    Induction: Intravenous  PONV Risk Score and Plan: Propofol infusion  Airway Management Planned: Natural Airway and Simple Face Mask  Additional Equipment: None  Intra-op Plan:   Post-operative Plan:   Informed Consent: I have reviewed the patients History and Physical, chart, labs and discussed the procedure including the risks, benefits and alternatives for the proposed anesthesia with the patient or authorized representative who has indicated his/her understanding and acceptance.       Plan Discussed with: CRNA  Anesthesia Plan Comments: (Echo:  1. Left ventricular ejection fraction, by estimation, is 70 to 75%. The  left ventricle has hyperdynamic function. The left ventricle has no  regional wall motion abnormalities. There is mild left ventricular  hypertrophy. Left ventricular diastolic  parameters are  consistent with Grade II diastolic dysfunction  (pseudonormalization).  2. Right ventricular systolic function is normal. The right ventricular  size is normal. There is mildly elevated pulmonary artery systolic  pressure. The estimated right ventricular systolic pressure is 36.9 mmHg.  3. Left atrial size was severely dilated.  4. The mitral valve is grossly normal. Mild mitral valve regurgitation.  5. The aortic valve is tricuspid. Aortic valve regurgitation is not  visualized. Moderate to severe aortic valve stenosis. Aortic valve mean  gradient measures 17.3 mmHg. Aortic valve Vmax measures 2.69 m/s.  Dimentionless index 0.32.  6. The inferior vena cava is normal in size with greater than 50%  respiratory variability, suggesting right atrial pressure of 3 mmHg. )       Anesthesia Quick Evaluation

## 2020-10-24 NOTE — Transfer of Care (Signed)
Immediate Anesthesia Transfer of Care Note  Patient: [name]  Procedure(s) Performed: CARDIOVERSION (N/A )  Patient Location: Endoscopy Unit  Anesthesia Type:General  Level of Consciousness: awake, alert  and oriented  Airway & Oxygen Therapy: Patient Spontanous Breathing  Post-op Assessment: Report given to RN and Post -op Vital signs reviewed and stable  Post vital signs: Reviewed and stable  Last Vitals:  Vitals Value Taken Time  BP 190/83 10/24/20 1230  Temp    Pulse 65 10/24/20 1230  Resp 13 10/24/20 1230  SpO2 99 % 10/24/20 1230    Last Pain:  Vitals:   10/24/20 1230  TempSrc:   PainSc: 0-No pain         Complications: No complications documented.

## 2020-10-26 ENCOUNTER — Encounter (HOSPITAL_COMMUNITY): Payer: Self-pay | Admitting: Cardiology

## 2020-11-06 ENCOUNTER — Ambulatory Visit (INDEPENDENT_AMBULATORY_CARE_PROVIDER_SITE_OTHER): Payer: Medicare Other | Admitting: *Deleted

## 2020-11-06 DIAGNOSIS — I4819 Other persistent atrial fibrillation: Secondary | ICD-10-CM

## 2020-11-06 NOTE — Progress Notes (Signed)
Presents for EKG per last office visit post DCCV. Reports feeling palpitations that are stable. Denies dizziness, chest pain or SOB

## 2020-11-20 DIAGNOSIS — E7849 Other hyperlipidemia: Secondary | ICD-10-CM | POA: Diagnosis not present

## 2020-11-20 DIAGNOSIS — I10 Essential (primary) hypertension: Secondary | ICD-10-CM | POA: Diagnosis not present

## 2020-11-20 DIAGNOSIS — M19041 Primary osteoarthritis, right hand: Secondary | ICD-10-CM | POA: Diagnosis not present

## 2020-11-20 DIAGNOSIS — E1165 Type 2 diabetes mellitus with hyperglycemia: Secondary | ICD-10-CM | POA: Diagnosis not present

## 2020-12-11 DIAGNOSIS — M1612 Unilateral primary osteoarthritis, left hip: Secondary | ICD-10-CM | POA: Diagnosis not present

## 2020-12-11 DIAGNOSIS — R5383 Other fatigue: Secondary | ICD-10-CM | POA: Diagnosis not present

## 2020-12-11 DIAGNOSIS — E119 Type 2 diabetes mellitus without complications: Secondary | ICD-10-CM | POA: Diagnosis not present

## 2020-12-11 DIAGNOSIS — I10 Essential (primary) hypertension: Secondary | ICD-10-CM | POA: Diagnosis not present

## 2020-12-11 DIAGNOSIS — Z23 Encounter for immunization: Secondary | ICD-10-CM | POA: Diagnosis not present

## 2020-12-11 DIAGNOSIS — E782 Mixed hyperlipidemia: Secondary | ICD-10-CM | POA: Diagnosis not present

## 2021-01-11 ENCOUNTER — Ambulatory Visit: Payer: Medicare Other | Admitting: Cardiology

## 2021-01-15 ENCOUNTER — Other Ambulatory Visit: Payer: Self-pay | Admitting: Cardiology

## 2021-03-20 DIAGNOSIS — E7849 Other hyperlipidemia: Secondary | ICD-10-CM | POA: Diagnosis not present

## 2021-03-20 DIAGNOSIS — M19041 Primary osteoarthritis, right hand: Secondary | ICD-10-CM | POA: Diagnosis not present

## 2021-03-20 DIAGNOSIS — E1165 Type 2 diabetes mellitus with hyperglycemia: Secondary | ICD-10-CM | POA: Diagnosis not present

## 2021-03-20 DIAGNOSIS — I10 Essential (primary) hypertension: Secondary | ICD-10-CM | POA: Diagnosis not present

## 2021-05-09 DIAGNOSIS — H9221 Otorrhagia, right ear: Secondary | ICD-10-CM | POA: Diagnosis not present

## 2021-05-09 DIAGNOSIS — H6501 Acute serous otitis media, right ear: Secondary | ICD-10-CM | POA: Diagnosis not present

## 2021-05-27 DIAGNOSIS — H6121 Impacted cerumen, right ear: Secondary | ICD-10-CM | POA: Diagnosis not present

## 2021-05-27 DIAGNOSIS — H903 Sensorineural hearing loss, bilateral: Secondary | ICD-10-CM | POA: Diagnosis not present

## 2021-06-13 DIAGNOSIS — Z23 Encounter for immunization: Secondary | ICD-10-CM | POA: Diagnosis not present

## 2021-06-13 DIAGNOSIS — M1612 Unilateral primary osteoarthritis, left hip: Secondary | ICD-10-CM | POA: Diagnosis not present

## 2021-06-13 DIAGNOSIS — R634 Abnormal weight loss: Secondary | ICD-10-CM | POA: Diagnosis not present

## 2021-06-13 DIAGNOSIS — E7849 Other hyperlipidemia: Secondary | ICD-10-CM | POA: Diagnosis not present

## 2021-06-13 DIAGNOSIS — M545 Low back pain, unspecified: Secondary | ICD-10-CM | POA: Diagnosis not present

## 2021-06-13 DIAGNOSIS — I700 Atherosclerosis of aorta: Secondary | ICD-10-CM | POA: Diagnosis not present

## 2021-06-13 DIAGNOSIS — K250 Acute gastric ulcer with hemorrhage: Secondary | ICD-10-CM | POA: Diagnosis not present

## 2021-06-13 DIAGNOSIS — I10 Essential (primary) hypertension: Secondary | ICD-10-CM | POA: Diagnosis not present

## 2021-06-13 DIAGNOSIS — E782 Mixed hyperlipidemia: Secondary | ICD-10-CM | POA: Diagnosis not present

## 2021-06-13 DIAGNOSIS — R5383 Other fatigue: Secondary | ICD-10-CM | POA: Diagnosis not present

## 2021-06-13 DIAGNOSIS — E119 Type 2 diabetes mellitus without complications: Secondary | ICD-10-CM | POA: Diagnosis not present

## 2021-06-24 IMAGING — DX DG CHEST 1V PORT
1 series · 1 of 1 positions shown · non-contrast
Comparison: 09/03/2011

CLINICAL DATA: Chest pain and pressure beginning last night.

EXAM:
PORTABLE CHEST 1 VIEW

[chest ap]
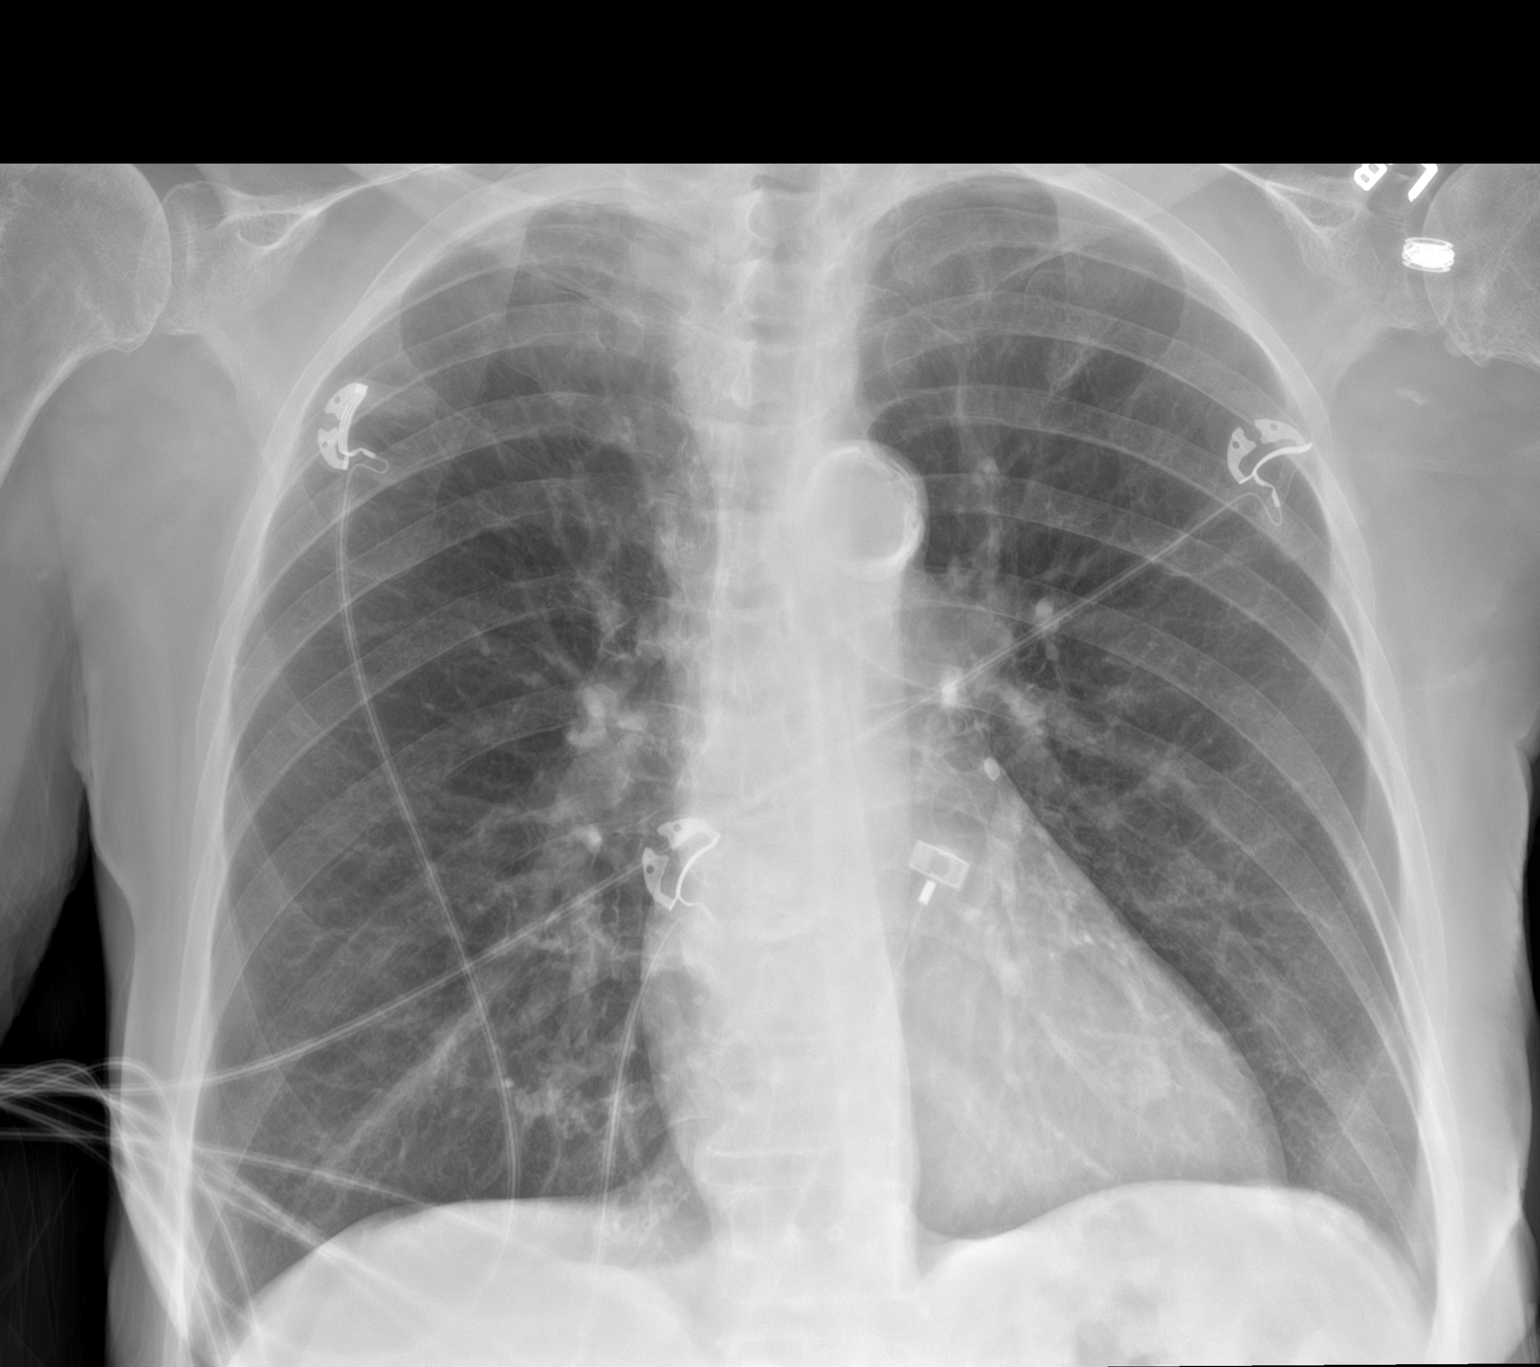

[1 of 1 positions shown; findings below may reference images not displayed]

FINDINGS: Heart size is normal. Chronic aortic atherosclerosis. Possible
emphysema. No sign of active infiltrate, mass, effusion or collapse.
No acute bone finding.
IMPRESSION: No active disease. Possible emphysema. Aortic atherosclerosis.

## 2021-06-25 IMAGING — US US RENAL
1 series · 14 of 25 positions shown · non-contrast
Comparison: None.

CLINICAL DATA: Acute kidney injury.

EXAM:
RENAL / URINARY TRACT ULTRASOUND COMPLETE

[Series 1: us renal · 0.23mm/px · 14 of 69 slices shown]
[im 1/69]
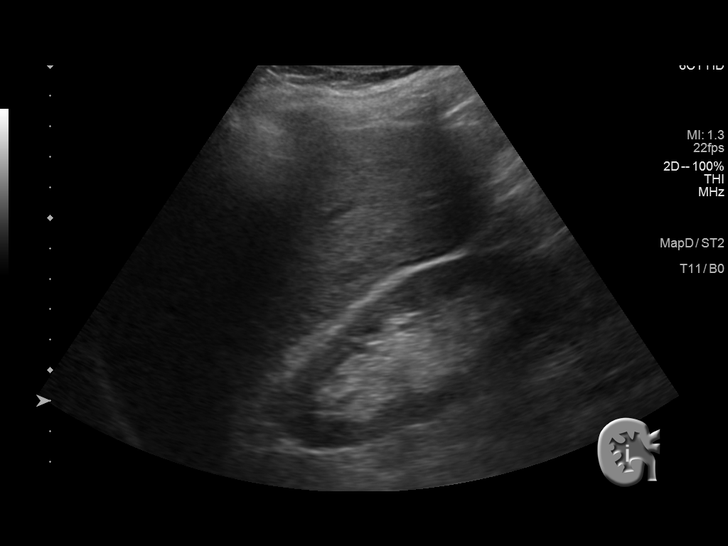
[im 6/69]
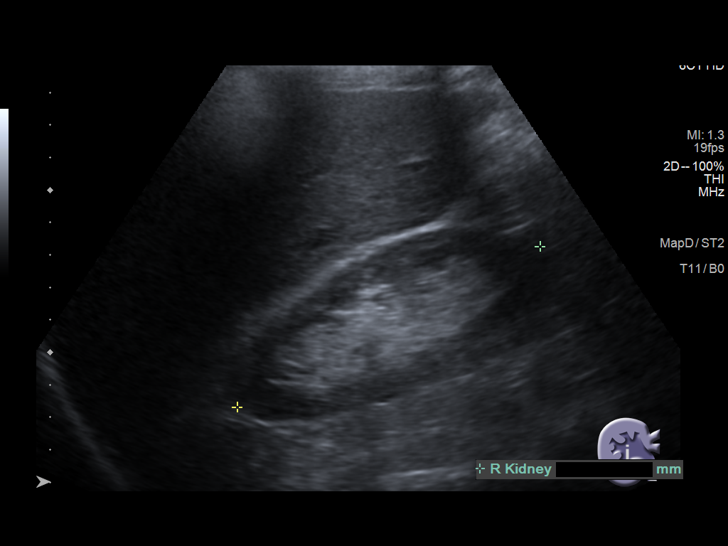
[im 12/69]
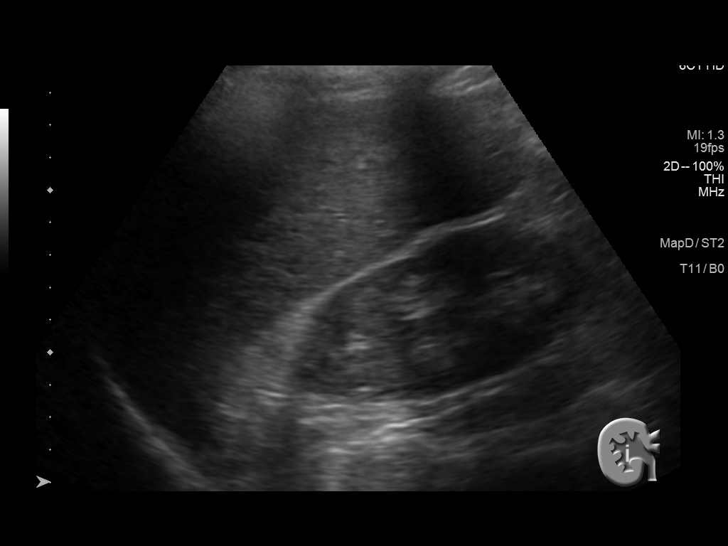
[im 18/69]
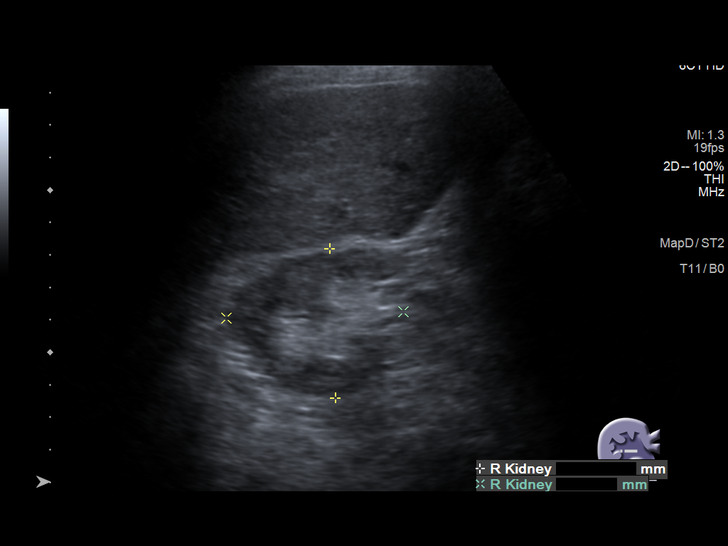
[im 23/69]
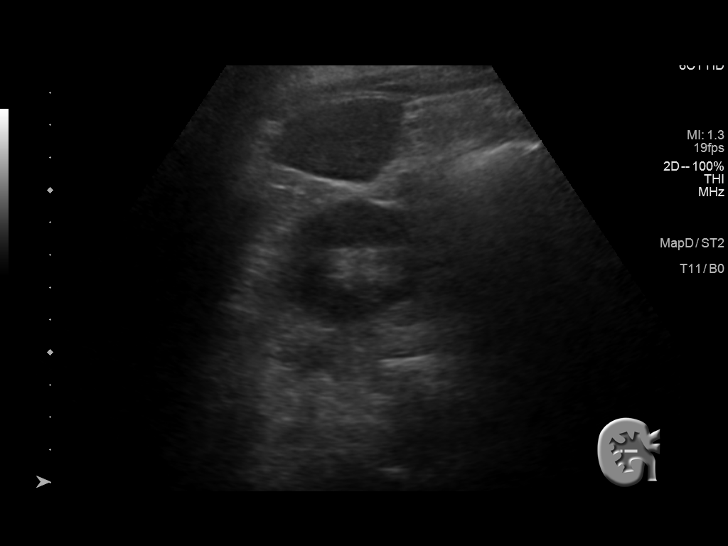
[im 26/69]
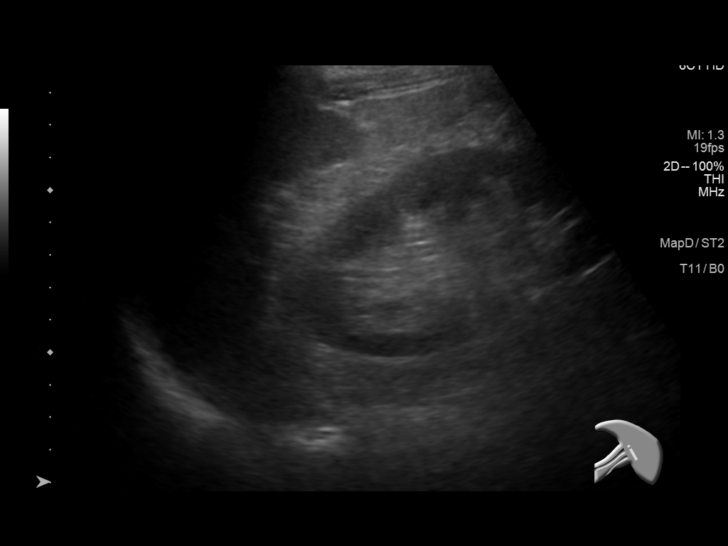
[im 32/69]
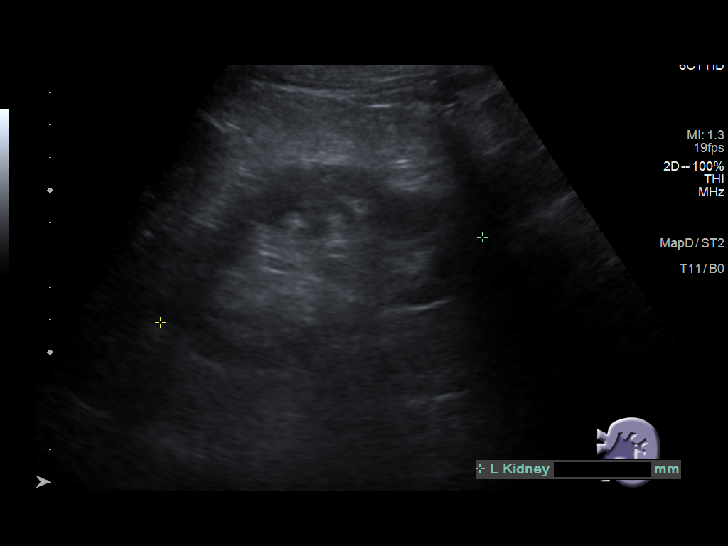
[im 37/69]
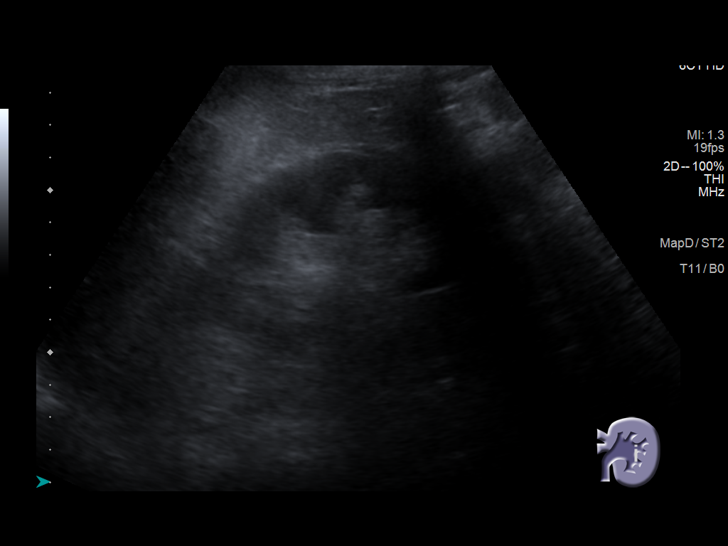
[im 43/69]
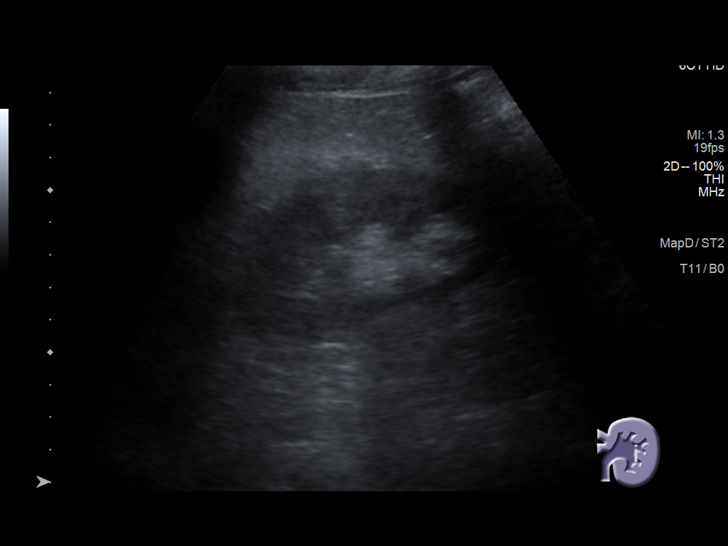
[im 46/69]
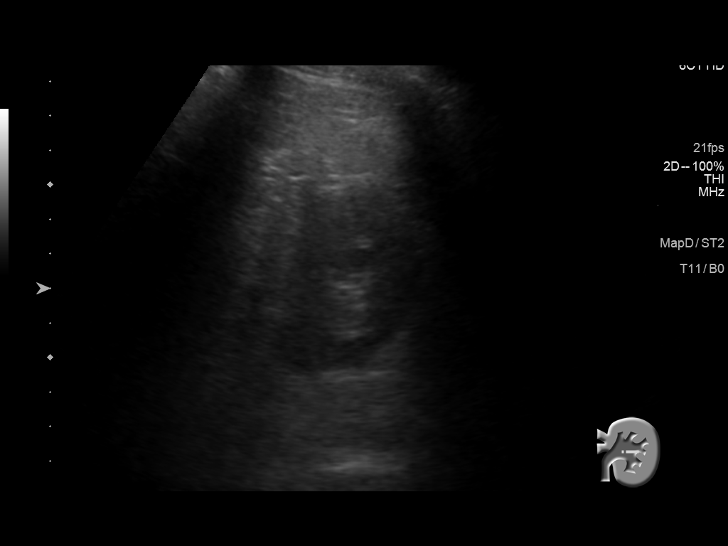
[im 52/69]
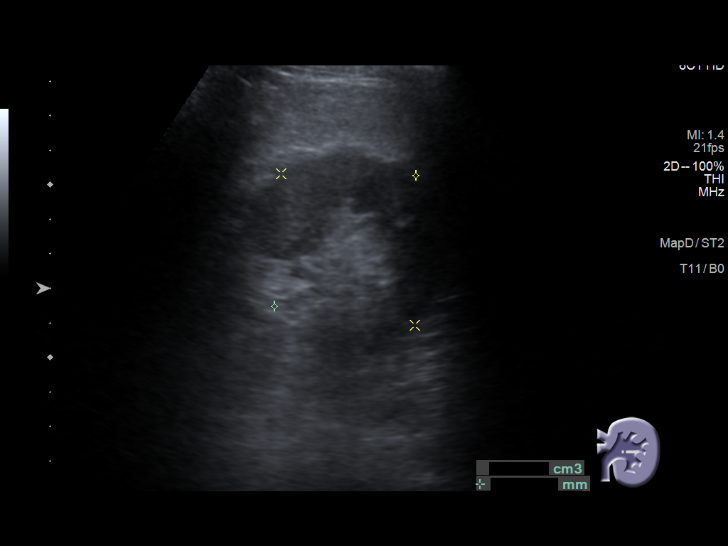
[im 57/69]
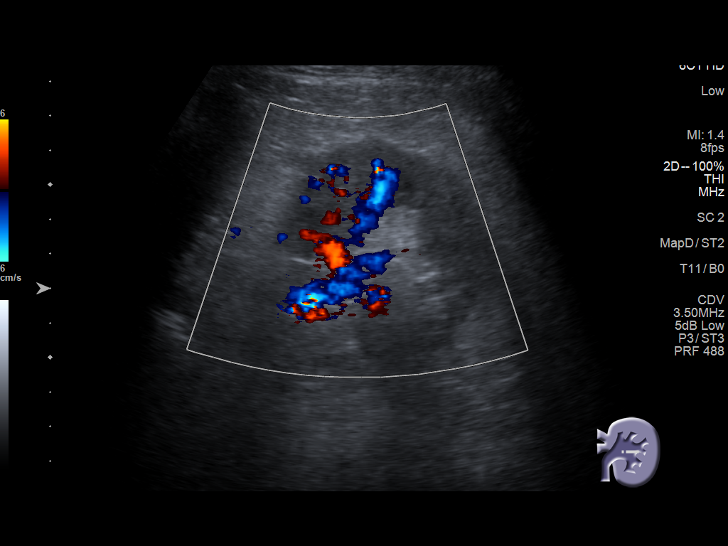
[im 63/69]
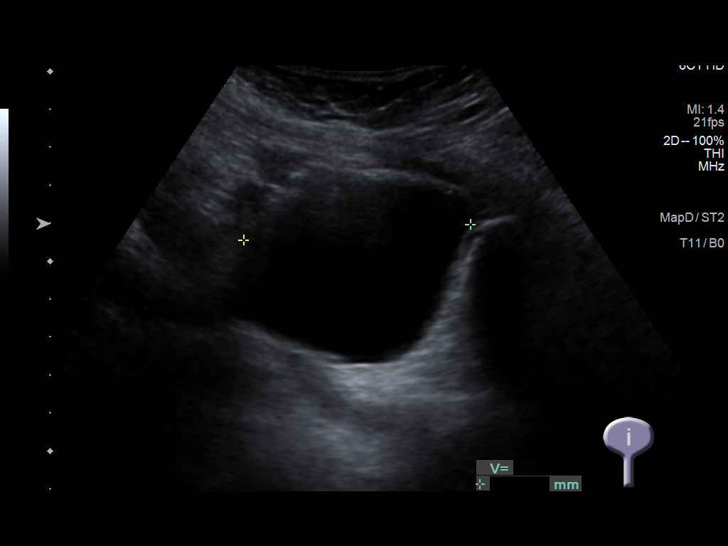
[im 69/69]
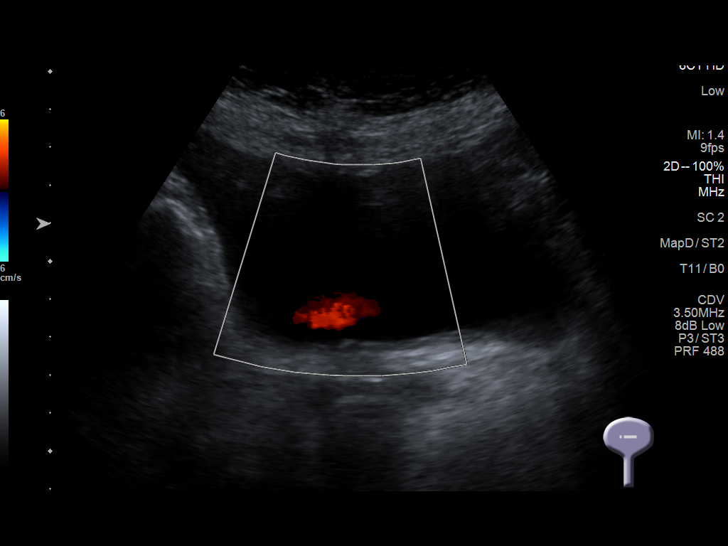

[14 of 25 positions shown; findings below may reference images not displayed]

FINDINGS: Right Kidney:

Renal measurements: 10.5 x 5.4 x 4.6 cm = volume: 138 mL.
Echogenicity within normal limits. No mass or hydronephrosis
visualized.

Left Kidney:

Renal measurements: 10.2 x 5.5 x 5.6 cm = volume: 170 mL.
Echogenicity within normal limits. No mass or hydronephrosis
visualized.

Bladder:

Appears normal for degree of bladder distention. Both ureteral jets
are visualized.

Other:

None.
IMPRESSION: Unremarkable renal ultrasound.  No obstructive uropathy.

## 2021-07-03 DIAGNOSIS — M545 Low back pain, unspecified: Secondary | ICD-10-CM | POA: Diagnosis not present

## 2021-07-03 DIAGNOSIS — Z6821 Body mass index (BMI) 21.0-21.9, adult: Secondary | ICD-10-CM | POA: Diagnosis not present

## 2021-07-03 DIAGNOSIS — I700 Atherosclerosis of aorta: Secondary | ICD-10-CM | POA: Diagnosis not present

## 2021-07-03 DIAGNOSIS — E875 Hyperkalemia: Secondary | ICD-10-CM | POA: Diagnosis not present

## 2021-07-03 DIAGNOSIS — I10 Essential (primary) hypertension: Secondary | ICD-10-CM | POA: Diagnosis not present

## 2021-07-21 DIAGNOSIS — M19041 Primary osteoarthritis, right hand: Secondary | ICD-10-CM | POA: Diagnosis not present

## 2021-07-21 DIAGNOSIS — E7849 Other hyperlipidemia: Secondary | ICD-10-CM | POA: Diagnosis not present

## 2021-07-21 DIAGNOSIS — I10 Essential (primary) hypertension: Secondary | ICD-10-CM | POA: Diagnosis not present

## 2021-07-21 DIAGNOSIS — E1165 Type 2 diabetes mellitus with hyperglycemia: Secondary | ICD-10-CM | POA: Diagnosis not present

## 2021-07-31 DIAGNOSIS — H9191 Unspecified hearing loss, right ear: Secondary | ICD-10-CM | POA: Diagnosis not present

## 2021-07-31 DIAGNOSIS — I700 Atherosclerosis of aorta: Secondary | ICD-10-CM | POA: Diagnosis not present

## 2021-07-31 DIAGNOSIS — E1165 Type 2 diabetes mellitus with hyperglycemia: Secondary | ICD-10-CM | POA: Diagnosis not present

## 2021-07-31 DIAGNOSIS — Z0001 Encounter for general adult medical examination with abnormal findings: Secondary | ICD-10-CM | POA: Diagnosis not present

## 2021-07-31 DIAGNOSIS — E7849 Other hyperlipidemia: Secondary | ICD-10-CM | POA: Diagnosis not present

## 2021-07-31 DIAGNOSIS — I10 Essential (primary) hypertension: Secondary | ICD-10-CM | POA: Diagnosis not present

## 2021-08-30 DIAGNOSIS — I10 Essential (primary) hypertension: Secondary | ICD-10-CM | POA: Diagnosis not present

## 2021-08-30 DIAGNOSIS — I4891 Unspecified atrial fibrillation: Secondary | ICD-10-CM | POA: Diagnosis not present

## 2021-08-30 DIAGNOSIS — R600 Localized edema: Secondary | ICD-10-CM | POA: Diagnosis not present

## 2021-08-30 DIAGNOSIS — Z6822 Body mass index (BMI) 22.0-22.9, adult: Secondary | ICD-10-CM | POA: Diagnosis not present

## 2021-08-30 DIAGNOSIS — I700 Atherosclerosis of aorta: Secondary | ICD-10-CM | POA: Diagnosis not present

## 2021-10-31 NOTE — Progress Notes (Signed)
Cardiology Office Note:    Date:  11/01/2021   ID:  [name], DOB [birth year], MRN [id number]  PCP:  Manon Hilding, MD [place] Cardiologist: Glenetta Hew, MD   Reason for visit: 1 year follow-up  History of Present Illness:    [name] is a 85 y.o. female with a hx of CAD, aortic stenosis, atrial fibrillation, hypertension.  She last saw Dr. Ellyn Hack in the office in October 2021 and was feeling well.  No chest pain.  She had high blood pressure worsened by stress.  With her atrial fibrillation, he Dr. Ellyn Hack recommended cardioversion.  In 10/2020, she briefly converted to normal sinus rhythm after 3 attempts up to 200 J but then the reverted back to A. fib.    Today, patient says she is doing well from a cardiovascular standpoint.  She occasionally feels palpitations.  She cannot tell if she felt any better when she was briefly converted to normal sinus rhythm last November.  She denies chest pain and shortness of breath.  She states her mobility is very limited by her chronic back pain.  She denies PND, orthopnea and lower extremity edema.  She has had some recent weight loss secondary to dietary changes.  She states her systolic blood pressure at home usually 130s to 140s.  She denies bleeding issues with Eliquis.  No syncope.  She has not needed to take any Lasix.  She previously had lower extremity edema when she was on Norvasc.     Past Medical History:  Diagnosis Date   CAD S/P percutaneous coronary angioplasty 1990s, 11/28/2011    a) midRCA PCI 1999 (NIR BMS 3.0 mm x 32 mm); distal  RCA BMS Sept 2012 (Vision BMS 2.5 mm x 15 mm); 12/02: RCA ISR --> Cutting PTCA    Dyslipidemia, goal LDL below 70 11/28/2011   Essential hypertension    On ACE inhibitor and ARB? As well as atenolol and hydralazine   Gastric ulcer 11/2018   GERD (gastroesophageal reflux disease)    History of blood transfusion    without reaction   Moderate aortic stenosis by prior  echocardiogram 08/2011; 08/2013   Echo 11/09/18: EF 60-65%. Gr 2 DD.  No RWMA.  Mild-Mod AS (mean gradient 19 mmHg). Mild-Mod PAH. ->  Follow-up echo March 2021 showed mean gradient 17 mmHg (dimensionless index 0.32)-this was reported as moderate-severe AS   Myocardial infarction ([place]) 1990s   Had PTCA then PCI on RCA; 11/2011--> Dyspnea and Shoulder pain prior to adm    Type 2 diabetes mellitus with complication, without long-term current use of insulin ([place])    no longer taking diabetic medication - diet controlled    Past Surgical History:  Procedure Laterality Date   ABDOMINAL HYSTERECTOMY     BIOPSY  06/03/2018   Procedure: BIOPSY;  Surgeon: Rogene Houston, MD;  Location: [facility];  Service: Endoscopy;;  gastric    BREAST BIOPSY     CARDIAC CATHETERIZATION  2012   performed for angina   CARDIAC CATHETERIZATION     CARDIOVERSION N/A 10/24/2020   Procedure: CARDIOVERSION;  Surgeon: Jerline Pain, MD;  Location: [place];  Service: Cardiovascular;  Laterality: N/A;   CORONARY ANGIOPLASTY  11/2006   Cutting Balloon of stent in the RCA   ESOPHAGOGASTRODUODENOSCOPY N/A 06/03/2018   Procedure: ESOPHAGOGASTRODUODENOSCOPY (EGD);  Surgeon: Rogene Houston, MD;  Location: [facility];  Service: Endoscopy;  Laterality: N/A;  1:40   ESOPHAGOGASTRODUODENOSCOPY N/A 12/02/2018  Procedure: ESOPHAGOGASTRODUODENOSCOPY (EGD);  Surgeon: Rogene Houston, MD;  Location: [facility];  Service: Endoscopy;  Laterality: N/A;  255   LEFT HEART CATHETERIZATION WITH CORONARY ANGIOGRAM N/A 11/27/2011   Procedure: LEFT HEART CATHETERIZATION WITH CORONARY ANGIOGRAM;  Surgeon: Leonie Man, MD;  Location[facility] C[facility] LAB;  Service: Cardiovascular;  Laterality: N/A;   PERCUTANEOUS CORONARY STENT INTERVENTION (PCI-S)  1999   Mid RCA - NIR DMS 3.0 mm x 30 mm, dRCA Vision BMS 2.5 mm x 15 mm    TRANSTHORACIC ECHOCARDIOGRAM  10/2018   EF 60-65%. Gr 2 DD.  No RWMA.  Mild-Mod AS  (mean gradient 19 mmHg).  Mild-Mod PAH.   TRANSTHORACIC ECHOCARDIOGRAM  03/01/2020   EF 70 to 75%.  No or WMA.  Mild LVH.  GRII DD with severe LA enlargement.  Mildly elevated PAP.  Mild MR.  Moderate AS (mean gradient 17 mmHg) with dimensionless index of 0.32.   TUBAL LIGATION      Current Medications: Current Meds  Medication Sig   acetaminophen (TYLENOL) 500 MG tablet Take 1,000 mg by mouth 2 (two) times daily as needed (pain.).   apixaban (ELIQUIS) 2.5 MG TABS tablet Take 1 tablet (2.5 mg total) by mouth 2 (two) times daily.   atorvastatin (LIPITOR) 20 MG tablet Take 20 mg by mouth at bedtime.    benazepril (LOTENSIN) 20 MG tablet Take 30 mg by mouth daily.   carvedilol (COREG) 25 MG tablet Take 1 tablet (25 mg total) by mouth 2 (two) times daily.   diclofenac Sodium (VOLTAREN) 1 % GEL Apply 2-4 g topically 2 (two) times daily as needed (back/hand pain.).    furosemide (LASIX) 40 MG tablet Take 40 mg by mouth daily as needed for fluid.    hydrALAZINE (APRESOLINE) 100 MG tablet TAKE ONE TABLET BY MOUTH TWICE DAILY (Patient taking differently: Take 100 mg by mouth 2 (two) times daily.)   NITROSTAT 0.4 MG SL tablet DISSOLVE 1 TAB UNDER TOUNGE FOR CHEST PAIN. MAY REPEAT EVERY 5 MINUTES FOR 3 DOSES. IF NO RELIEF CALL 911 OR GO TO ER (Patient taking differently: Place 0.4 mg under the tongue every 5 (five) minutes as needed for chest pain.)   traMADol (ULTRAM) 50 MG tablet Take 50 mg by mouth every 6 (six) hours as needed for moderate pain.      Allergies:   Valium   Social History   Socioeconomic History   Marital status: Married    Spouse name: Not on file   Number of children: Not on file   Years of education: Not on file   Highest education level: Not on file  Occupational History   Not on file  Tobacco Use   Smoking status: Former   Smokeless tobacco: Never   Tobacco comments:    Quit approximately 1993  Vaping Use   Vaping Use: Never used  Substance and Sexual Activity   Alcohol use: No   Drug  use: No   Sexual activity: Not Currently    Birth control/protection: Post-menopausal  Other Topics Concern   Not on file  Social History Narrative   She is a married mother of 53, grandmother to. 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 Quit smoking in '99, denies alcohol.   She volunteers for [employer].   Social Determinants of Health   Financial Resource Strain: Not on file  Food Insecurity: Not on file  Transportation Needs: Not on file  Physical Activity: Not on file  Stress: Not on file  Social Connections: Not on file     Family History: The patient's family history includes Cancer in her mother; Heart Problems in her brother; Heart disease in her brother, father, maternal grandfather, and maternal grandmother; Nephrolithiasis in her brother.  ROS:   Please see the history of present illness.     EKGs/Labs/Other Studies Reviewed:    EKG:  The ekg ordered today demonstrates atrial fibrillation, rightward axis, nonspecific ST wave and T wave abnormality, heart rate 96, QRS duration 84 ms.  Recent Labs: No results found for requested labs within last 8760 hours.   Recent Lipid Panel No results found for: CHOL, TRIG, HDL, LDLCALC, LDLDIRECT  Physical Exam:    VS:  BP 138/80   Pulse 96   Wt 126 lb 9.6 oz (57.4 kg)   SpO2 97%   BMI 20.75 kg/m    No data found.  Wt Readings from Last 3 Encounters:  11/01/21 126 lb 9.6 oz (57.4 kg)  10/11/20 128 lb 9.6 oz (58.3 kg)  09/10/20 127 lb 9.6 oz (57.9 kg)     GEN:  Well nourished, well developed in no acute distress HEENT: Normal NECK: No JVD; No carotid bruits CARDIAC: RRR, systolic murmur, no rubs/gallops RESPIRATORY:  Clear to auscultation without rales, wheezing or rhonchi  ABDOMEN: Soft, non-tender, non-distended MUSCULOSKELETAL: No edema; No deformity  SKIN: Warm and dry NEUROLOGIC:  Alert and oriented PSYCHIATRIC:  Normal affect    ASSESSMENT AND PLAN   CAD, no angina -Status post PCI to RCA  in 1999, PCI to the RCA in 2012, PTCA to the RCA ISR in December 2002. -Continue beta-blocker, ACE and statin therapy. -No aspirin given anticoagulation needs.  Atrial fibrillation -Did not maintain normal sinus rhythm post cardioversion  -Pursue rate control given severely dilated left atria  -Continue beta-blocker for rate control and Eliquis for stroke prevention.  Chronic diastolic heart failure -Echo March 2021: LVEF 70-75%, mild LVH, grade 2 diastolic dysfunction, severely dilated left atria, mild mitral valve regurgitation, moderate to severe aortic valve stenosis  -BP control -continue Lasix as needed.  Aortic stenosis -Moderate to severe on echo March 2021; mean gradient 17 mmHg, V-max 2.39m/s, Dimentionless index 0.32.  -Denies chest pain, heart failure symptoms and syncope. -Repeat echo  Hypertension -Given her advanced age, Dr. Herbie Baltimore recommended goal blood pressure 140s. -Continue current medications. -Recommend DASH diet (high in vegetables, fruits, low-fat dairy products, whole grains, poultry, fish, and nuts and low in sweets, sugar-sweetened beverages, and red meats), salt restriction and increase physical activity.  Hyperlipidemia -LDL 62 in June 2022.   -Continue Lipitor.  Disposition - Follow-up in 1 year with Dr. Herbie Baltimore.  We will follow-up echo results.      Medication Adjustments/Labs and Tests Ordered: Current medicines are reviewed at length with the patient today.  Concerns regarding medicines are outlined above.  Orders Placed This Encounter  Procedures   ECHOCARDIOGRAM COMPLETE   No orders of the defined types were placed in this encounter.   Patient Instructions  Medication Instructions:  No Changes *If you need a refill on your cardiac medications before your next appointment, please call your pharmacy*   Lab Work: No labs If you have labs (blood work) drawn today and your tests are completely normal, you will receive your results only  by: MyChart Message (if you have MyChart) OR A paper copy in the mail If you have any lab test that is abnormal or we need to change your treatment,  we will call you to review the results.   Testing/Procedures: [address] [place] has requested that you have an echocardiogram. Echocardiography is a painless test that uses sound waves to create images of your heart. It provides your doctor with information about the size and shape of your heart and how well your heart's chambers and valves are working. This procedure takes approximately one hour. There are no restrictions for this procedure.    Follow-Up: At [facility], you and your health needs are our priority.  As part of our continuing mission to provide you with exceptional heart care, we have created designated Provider Care Teams.  These Care Teams include your primary Cardiologist (physician) and Advanced Practice Providers (APPs -  Physician Assistants and Nurse Practitioners) who all work together to provide you with the care you need, when you need it.  We recommend signing up for the patient portal called "MyChart".  Sign up information is provided on this After Visit Summary.  MyChart is used to connect with patients for Virtual Visits (Telemedicine).  Patients are able to view lab/test results, encounter notes, upcoming appointments, etc.  Non-urgent messages can be sent to your provider as well.   To learn more about what you can do with MyChart, go to [email or web address].    Your next appointment:   1 year(s)  The format for your next appointment:   In Person  Provider:   Glenetta Hew, MD       Signed, Warren Lacy, PA-C  11/01/2021 12:07 PM    [place]

## 2021-11-01 ENCOUNTER — Ambulatory Visit: Payer: Medicare Other | Admitting: Physician Assistant

## 2021-11-01 ENCOUNTER — Other Ambulatory Visit: Payer: Self-pay

## 2021-11-01 ENCOUNTER — Encounter: Payer: Self-pay | Admitting: Physician Assistant

## 2021-11-01 VITALS — BP 138/80 | HR 96 | Wt 126.6 lb

## 2021-11-01 DIAGNOSIS — I350 Nonrheumatic aortic (valve) stenosis: Secondary | ICD-10-CM

## 2021-11-01 DIAGNOSIS — I10 Essential (primary) hypertension: Secondary | ICD-10-CM

## 2021-11-01 DIAGNOSIS — I2510 Atherosclerotic heart disease of native coronary artery without angina pectoris: Secondary | ICD-10-CM | POA: Diagnosis not present

## 2021-11-01 DIAGNOSIS — I4819 Other persistent atrial fibrillation: Secondary | ICD-10-CM

## 2021-11-01 NOTE — Patient Instructions (Signed)
Medication Instructions:  No Changes *If you need a refill on your cardiac medications before your next appointment, please call your pharmacy*   Lab Work: No labs If you have labs (blood work) drawn today and your tests are completely normal, you will receive your results only by: MyChart Message (if you have MyChart) OR A paper copy in the mail If you have any lab test that is abnormal or we need to change your treatment, we will call you to review the results.   Testing/Procedures: [address], [address] Your physician has requested that you have an echocardiogram. Echocardiography is a painless test that uses sound waves to create images of your heart. It provides your doctor with information about the size and shape of your heart and how well your heart's chambers and valves are working. This procedure takes approximately one hour. There are no restrictions for this procedure.    Follow-Up: At [facility], you and your health needs are our priority.  As part of our continuing mission to provide you with exceptional heart care, we have created designated Provider Care Teams.  These Care Teams include your primary Cardiologist (physician) and Advanced Practice Providers (APPs -  Physician Assistants and Nurse Practitioners) who all work together to provide you with the care you need, when you need it.  We recommend signing up for the patient portal called "MyChart".  Sign up information is provided on this After Visit Summary.  MyChart is used to connect with patients for Virtual Visits (Telemedicine).  Patients are able to view lab/test results, encounter notes, upcoming appointments, etc.  Non-urgent messages can be sent to your provider as well.   To learn more about what you can do with MyChart, go to [email or web address].    Your next appointment:   1 year(s)  The format for your next appointment:   In Person  Provider:   Bryan Lemma, MD

## 2021-11-06 NOTE — Addendum Note (Signed)
Addended by: Myna Hidalgo A on: 11/06/2021 02:28 PM   Modules accepted: Orders

## 2021-11-20 DIAGNOSIS — I10 Essential (primary) hypertension: Secondary | ICD-10-CM | POA: Diagnosis not present

## 2021-11-20 DIAGNOSIS — M19041 Primary osteoarthritis, right hand: Secondary | ICD-10-CM | POA: Diagnosis not present

## 2021-11-20 DIAGNOSIS — E1165 Type 2 diabetes mellitus with hyperglycemia: Secondary | ICD-10-CM | POA: Diagnosis not present

## 2021-11-20 DIAGNOSIS — E7849 Other hyperlipidemia: Secondary | ICD-10-CM | POA: Diagnosis not present

## 2021-11-27 ENCOUNTER — Other Ambulatory Visit: Payer: Self-pay

## 2021-11-27 ENCOUNTER — Ambulatory Visit (HOSPITAL_COMMUNITY): Payer: Medicare Other | Attending: Cardiology

## 2021-11-27 DIAGNOSIS — I350 Nonrheumatic aortic (valve) stenosis: Secondary | ICD-10-CM | POA: Diagnosis not present

## 2021-11-27 DIAGNOSIS — I10 Essential (primary) hypertension: Secondary | ICD-10-CM

## 2021-11-27 DIAGNOSIS — I2510 Atherosclerotic heart disease of native coronary artery without angina pectoris: Secondary | ICD-10-CM

## 2021-11-27 DIAGNOSIS — I4819 Other persistent atrial fibrillation: Secondary | ICD-10-CM

## 2021-11-27 LAB — ECHOCARDIOGRAM COMPLETE
AR max vel: 0.92 cm2
AV Area VTI: 0.89 cm2
AV Area mean vel: 0.94 cm2
AV Mean grad: 22.5 mmHg
AV Peak grad: 37.8 mmHg
Ao pk vel: 3.08 m/s
S' Lateral: 2.8 cm

## 2021-12-05 DIAGNOSIS — D649 Anemia, unspecified: Secondary | ICD-10-CM | POA: Diagnosis not present

## 2021-12-05 DIAGNOSIS — E782 Mixed hyperlipidemia: Secondary | ICD-10-CM | POA: Diagnosis not present

## 2021-12-05 DIAGNOSIS — E7849 Other hyperlipidemia: Secondary | ICD-10-CM | POA: Diagnosis not present

## 2021-12-05 DIAGNOSIS — E875 Hyperkalemia: Secondary | ICD-10-CM | POA: Diagnosis not present

## 2021-12-05 DIAGNOSIS — I10 Essential (primary) hypertension: Secondary | ICD-10-CM | POA: Diagnosis not present

## 2021-12-05 DIAGNOSIS — R5382 Chronic fatigue, unspecified: Secondary | ICD-10-CM | POA: Diagnosis not present

## 2021-12-05 DIAGNOSIS — E1165 Type 2 diabetes mellitus with hyperglycemia: Secondary | ICD-10-CM | POA: Diagnosis not present

## 2021-12-05 DIAGNOSIS — R5383 Other fatigue: Secondary | ICD-10-CM | POA: Diagnosis not present

## 2021-12-10 DIAGNOSIS — I10 Essential (primary) hypertension: Secondary | ICD-10-CM | POA: Diagnosis not present

## 2021-12-10 DIAGNOSIS — E119 Type 2 diabetes mellitus without complications: Secondary | ICD-10-CM | POA: Diagnosis not present

## 2021-12-10 DIAGNOSIS — K250 Acute gastric ulcer with hemorrhage: Secondary | ICD-10-CM | POA: Diagnosis not present

## 2021-12-10 DIAGNOSIS — R5383 Other fatigue: Secondary | ICD-10-CM | POA: Diagnosis not present

## 2021-12-10 DIAGNOSIS — I700 Atherosclerosis of aorta: Secondary | ICD-10-CM | POA: Diagnosis not present

## 2021-12-10 DIAGNOSIS — F32A Depression, unspecified: Secondary | ICD-10-CM | POA: Diagnosis not present

## 2021-12-10 DIAGNOSIS — E7849 Other hyperlipidemia: Secondary | ICD-10-CM | POA: Diagnosis not present

## 2021-12-10 DIAGNOSIS — Z6821 Body mass index (BMI) 21.0-21.9, adult: Secondary | ICD-10-CM | POA: Diagnosis not present

## 2021-12-11 ENCOUNTER — Telehealth: Payer: Self-pay | Admitting: Physician Assistant

## 2021-12-11 NOTE — Telephone Encounter (Signed)
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 I tried to make a structural apt for her but she said she is far too overwhelmed to think about it at this time. Her husband has been admitted several times, can't walk and he is working with PT and they lost a son in Sept. She would like to get through the holidays and "settle down a bit". She requested that we call her back in the second week of January to make a structural heart appt.   Cline Crock PA-C  [facility]

## 2021-12-20 ENCOUNTER — Telehealth: Payer: Self-pay | Admitting: *Deleted

## 2021-12-20 DIAGNOSIS — I350 Nonrheumatic aortic (valve) stenosis: Secondary | ICD-10-CM

## 2021-12-20 NOTE — Telephone Encounter (Signed)
-----   Message from Cannon Kettle, PA-C sent at 12/19/2021  2:25 PM EST ----- Regarding: Spoke with pt re: rechecking echo in 6 months & holding off on structural team referral [facility] team,   Spoke with pt re: rechecking echo in 6 months & holding off on structural team referral.   Judeth Cornfield - please place order for limited echo in 6 months, dx: severe aortic stenosis.  Happy New Year,  Bobbye Morton, PA-C   ----- Message ----- From: Marykay Lex, MD Sent: 12/19/2021  12:22 AM EST To: Tobin Chad, RN, Cannon Kettle, PA-C Subject: Annell Greening: AS                                         Victorino Dike - forwarding response from [facility] -- agrees with 6 month f/u Echo. If more progression - would refer to Structural Team.   [facility] ----- Message ----- From: Thurmon Fair, MD Sent: 12/16/2021  11:45 AM EST To: Marykay Lex, MD, Haywood Pao, RRT Subject: RE: AS                                         Agree with an echo in 6 months as best step. BTW, she would be a perfect candidate for the PROGRESS trial (earlier TAVR for moderate AS). ----- Message ----- From: Marykay Lex, MD Sent: 12/11/2021   4:53 PM EST To: Thurmon Fair, MD, Cannon Kettle, PA-C Subject: AS                                             I looked at the echocardiogram.  Not sure that we can call a paradoxical aortic stenosis because her EF is normal.  I do think there is progression of the aortic valve disease and we probably ought to recheck one in about 6 months.  She was asymptomatic, sure were okay to wait and just recheck an echocardiogram in 6 months to reassess. -> will forward to TAVR Structural Reader to review just in case.  Bryan Lemma, MD  CC'd Mihai Croitoru  ----- Message ----- From: Bernette Mayers Sent: 11/28/2021   8:23 AM EST To: Marykay Lex, MD  Dr. Herbie Baltimore,  You last saw this  85 year old patient 09/2020. She has been getting echoes every 1-2 years to monitor  moderate-severe AS.  Dr. Shari Prows read her repeat echo yesterday: "Suspect that there is severe, paradoxical aortic stenosis with AVA  0.81cm2, mean gradient , Vmax 3.45m/s, DI 0.24, SVI 35. Would  recommend CTA TAVR protocol for further evaluation."  At her visit with me 11/01/2021, she had no SOB, CP, CHF or syncope.    Can you review & advise.   Thank you, Bobbye Morton     Aortic stenosis -Moderate to severe on echo March 2021; mean gradient 17 mmHg, V-max 2.86m/s, Dimentionless index 0.32.  -Denies chest pain, heart failure symptoms and syncope. -Repeat echo

## 2021-12-20 NOTE — Telephone Encounter (Signed)
Per Juanda Crumble PA-C -- Order placed - limited echo to be done 6 months ( June 2023)

## 2021-12-25 ENCOUNTER — Telehealth: Payer: Self-pay

## 2021-12-25 NOTE — Telephone Encounter (Deleted)
-----   Message from Cannon Kettle, PA-C sent at 12/19/2021  2:30 PM EST ----- Per Dr. Herbie Baltimore, "Not sure that we can call a paradoxical aortic stenosis because her EF is normal.  I do think there is progression of the aortic valve disease and we probably ought to recheck one in about 6 months."  As patient was asymptomatic, we will plan to recheck an echocardiogram in 6 months to reassess.  Plan discussed with Dr. Royann Shivers (structural reader) & Carlean Jews PA from structural team as well as patient.

## 2021-12-25 NOTE — Telephone Encounter (Signed)
-----   Message from Cannon Kettle, PA-C sent at 12/19/2021  2:25 PM EST ----- Regarding: Spoke with pt re: rechecking echo in 6 months & holding off on structural team referral [facility] team,   Spoke with pt re: rechecking echo in 6 months & holding off on structural team referral.   Judeth Cornfield - please place order for limited echo in 6 months, dx: severe aortic stenosis.  Happy New Year,  Bobbye Morton, PA-C   ----- Message ----- From: Marykay Lex, MD Sent: 12/19/2021  12:22 AM EST To: Tobin Chad, RN, Cannon Kettle, PA-C Subject: Annell Greening: AS                                         Victorino Dike - forwarding response from [facility] -- agrees with 6 month f/u Echo. If more progression - would refer to Structural Team.   [facility] ----- Message ----- From: Thurmon Fair, MD Sent: 12/16/2021  11:45 AM EST To: Marykay Lex, MD, Haywood Pao, RRT Subject: RE: AS                                         Agree with an echo in 6 months as best step. BTW, she would be a perfect candidate for the PROGRESS trial (earlier TAVR for moderate AS). ----- Message ----- From: Marykay Lex, MD Sent: 12/11/2021   4:53 PM EST To: Thurmon Fair, MD, Cannon Kettle, PA-C Subject: AS                                             I looked at the echocardiogram.  Not sure that we can call a paradoxical aortic stenosis because her EF is normal.  I do think there is progression of the aortic valve disease and we probably ought to recheck one in about 6 months.  She was asymptomatic, sure were okay to wait and just recheck an echocardiogram in 6 months to reassess. -> will forward to TAVR Structural Reader to review just in case.  Bryan Lemma, MD  CC'd Mihai Croitoru  ----- Message ----- From: Bernette Mayers Sent: 11/28/2021   8:23 AM EST To: Marykay Lex, MD  Dr. Herbie Baltimore,  You last saw this  86 year old patient 09/2020. She has been getting echoes every 1-2 years to monitor  moderate-severe AS.  Dr. Shari Prows read her repeat echo yesterday: "Suspect that there is severe, paradoxical aortic stenosis with AVA  0.81cm2, mean gradient , Vmax 3.45m/s, DI 0.24, SVI 35. Would  recommend CTA TAVR protocol for further evaluation."  At her visit with me 11/01/2021, she had no SOB, CP, CHF or syncope.    Can you review & advise.   Thank you, Bobbye Morton     Aortic stenosis -Moderate to severe on echo March 2021; mean gradient 17 mmHg, V-max 2.86m/s, Dimentionless index 0.32.  -Denies chest pain, heart failure symptoms and syncope. -Repeat echo

## 2021-12-25 NOTE — Telephone Encounter (Signed)
Patient advised of results with understanding. Patient will have repeat echo in June 2023.

## 2022-01-16 ENCOUNTER — Encounter: Payer: Self-pay | Admitting: Family Medicine

## 2022-01-16 ENCOUNTER — Ambulatory Visit (INDEPENDENT_AMBULATORY_CARE_PROVIDER_SITE_OTHER): Payer: Medicare Other | Admitting: Family Medicine

## 2022-01-16 VITALS — BP 148/67 | HR 92 | Temp 98.4°F | Ht 65.5 in | Wt 126.0 lb

## 2022-01-16 DIAGNOSIS — I2510 Atherosclerotic heart disease of native coronary artery without angina pectoris: Secondary | ICD-10-CM

## 2022-01-16 DIAGNOSIS — E1169 Type 2 diabetes mellitus with other specified complication: Secondary | ICD-10-CM | POA: Diagnosis not present

## 2022-01-16 DIAGNOSIS — I5032 Chronic diastolic (congestive) heart failure: Secondary | ICD-10-CM

## 2022-01-16 DIAGNOSIS — E785 Hyperlipidemia, unspecified: Secondary | ICD-10-CM | POA: Diagnosis not present

## 2022-01-16 DIAGNOSIS — M545 Low back pain, unspecified: Secondary | ICD-10-CM

## 2022-01-16 DIAGNOSIS — I4819 Other persistent atrial fibrillation: Secondary | ICD-10-CM

## 2022-01-16 DIAGNOSIS — I350 Nonrheumatic aortic (valve) stenosis: Secondary | ICD-10-CM

## 2022-01-16 DIAGNOSIS — I152 Hypertension secondary to endocrine disorders: Secondary | ICD-10-CM

## 2022-01-16 DIAGNOSIS — G8929 Other chronic pain: Secondary | ICD-10-CM

## 2022-01-16 DIAGNOSIS — I110 Hypertensive heart disease with heart failure: Secondary | ICD-10-CM

## 2022-01-16 DIAGNOSIS — E119 Type 2 diabetes mellitus without complications: Secondary | ICD-10-CM

## 2022-01-16 DIAGNOSIS — E1159 Type 2 diabetes mellitus with other circulatory complications: Secondary | ICD-10-CM | POA: Diagnosis not present

## 2022-01-16 MED ORDER — METHYLPREDNISOLONE ACETATE 40 MG/ML IJ SUSP
40.0000 mg | Freq: Once | INTRAMUSCULAR | Status: AC
  Administered 2022-01-16: 40 mg via INTRAMUSCULAR

## 2022-01-16 NOTE — Progress Notes (Signed)
Subjective:  Patient ID: [name], female    DOB: [birth year], 86 y.o.   MRN: [id number]  Patient Care Team: Sonny Masters, FNP as PCP - General (Family Medicine) Marykay Lex, MD as PCP - Cardiology (Cardiology)   Chief Complaint:  New Patient (Initial Visit)   HPI: [name] is a 86 y.o. female presenting on 01/16/2022 for New Patient (Initial Visit)   Pt presents to establish to care today. She was previously seen by Dr. Neita Carp. She is co-managed by cardiology, Dr. Herbie Baltimore. She has previously been treated for Type 2 Diabetes with Metformin but discontinued therapy due to improvement in A1C. Type 2 Diabetes is now diet controlled. She states recently fell and has since had worsening back pain.  PMH includes hypertension, hyperlipidemia, atrial fibrillation, and coronary artery disease. She states chronic lower back pain mildly controlled with tramadol.  Past surgical history includes hysterectomy in '82, RCA stent placement x2.  She has had recent experienced two deaths in the family including the death of her son.  Labs from 2021/12/22 reviewed.   Back Pain This is a chronic problem. The current episode started more than 1 year ago. The problem occurs intermittently. The problem has been waxing and waning since onset. The pain is present in the lumbar spine and sacro-iliac. The quality of the pain is described as aching and burning. The pain does not radiate. The symptoms are aggravated by stress, twisting, standing and position. Pertinent negatives include no abdominal pain, bladder incontinence, bowel incontinence, chest pain, dysuria, fever, headaches, leg pain, numbness, paresis, paresthesias, pelvic pain, perianal numbness, tingling, weakness or weight loss. Treatments tried: Tramadol. The treatment provided mild relief.   Relevant past medical, surgical, family, and social history reviewed and updated as indicated.  Allergies and medications reviewed and updated.  Data reviewed: Chart in Epic.   Past Medical History:  Diagnosis Date   Anxiety    CAD S/P percutaneous coronary angioplasty 1990s, 11/28/2011    a) midRCA PCI 1999 (NIR BMS 3.0 mm x 32 mm); distal  RCA BMS Sept 2012 (Vision BMS 2.5 mm x 15 mm); 12/02: RCA ISR --> Cutting PTCA    Coronary stent restenosis due to progression of disease 11/30/2011   Dyslipidemia, goal LDL below 70 11/28/2011   Essential hypertension    On ACE inhibitor and ARB? As well as atenolol and hydralazine   Gastric ulcer 12/22/18   GERD (gastroesophageal reflux disease)    History of blood transfusion    without reaction   Moderate aortic stenosis by prior echocardiogram 08/2011; 08/2013   Echo 11/09/18: EF 60-65%. Gr 2 DD.  No RWMA.  Mild-Mod AS (mean gradient 19 mmHg). Mild-Mod PAH. ->  Follow-up echo March 2021 showed mean gradient 17 mmHg (dimensionless index 0.32)-this was reported as moderate-severe AS   Myocardial infarction (HCC) 1990s   Had PTCA then PCI on RCA; Dec 23, 2011--> Dyspnea and Shoulder pain prior to adm    Non-ST elevation MI (NSTEMI) (HCC) 11/28/2011   Dyspnea and Shoulder pain prior to adm   Type 2 diabetes mellitus with complication, without long-term current use of insulin (HCC)    no longer taking diabetic medication - diet controlled    Past Surgical History:  Procedure Laterality Date   ABDOMINAL HYSTERECTOMY     BIOPSY  06/03/2018   Procedure: BIOPSY;  Surgeon: Malissa Hippo, MD;  Location: [facility];  Service: Endoscopy;;  gastric    BREAST BIOPSY  CARDIAC CATHETERIZATION  2012   performed for angina   CARDIAC CATHETERIZATION     CARDIOVERSION N/A 10/24/2020   Procedure: CARDIOVERSION;  Surgeon: Jake BatheSkains, Mark C, MD;  [facility];  Service: Cardiovascular;  Laterality: N/A;   CORONARY ANGIOPLASTY  11/2006   Cutting Balloon of stent in the RCA   ESOPHAGOGASTRODUODENOSCOPY N/A 06/03/2018   Procedure: ESOPHAGOGASTRODUODENOSCOPY (EGD);  Surgeon: Malissa Hippoehman, Najeeb U, MD;   Location: [facility];  Service: Endoscopy;  Laterality: N/A;  1:40   ESOPHAGOGASTRODUODENOSCOPY N/A 12/02/2018   Procedure: ESOPHAGOGASTRODUODENOSCOPY (EGD);  Surgeon: Malissa Hippoehman, Najeeb U, MD;  Location: [facility];  Service: Endoscopy;  Laterality: N/A;  255   LEFT HEART CATHETERIZATION WITH CORONARY ANGIOGRAM N/A 11/27/2011   Procedure: LEFT HEART CATHETERIZATION WITH CORONARY ANGIOGRAM;  Surgeon: Marykay Lexavid W Harding, MD;  [facility];  Service: Cardiovascular;  Laterality: N/A;   PERCUTANEOUS CORONARY STENT INTERVENTION (PCI-S)  1999   Mid RCA - NIR DMS 3.0 mm x 30 mm, dRCA Vision BMS 2.5 mm x 15 mm    TRANSTHORACIC ECHOCARDIOGRAM  10/2018   EF 60-65%. Gr 2 DD.  No RWMA.  Mild-Mod AS  (mean gradient 19 mmHg). Mild-Mod PAH.   TRANSTHORACIC ECHOCARDIOGRAM  03/01/2020   EF 70 to 75%.  No or WMA.  Mild LVH.  GRII DD with severe LA enlargement.  Mildly elevated PAP.  Mild MR.  Moderate AS (mean gradient 17 mmHg) with dimensionless index of 0.32.   TUBAL LIGATION      Social History   Socioeconomic History   Marital status: Married    Spouse name: Not on file   Number of children: Not on file   Years of education: Not on file   Highest education level: Not on file  Occupational History   Not on file  Tobacco Use   Smoking status: Former   Smokeless tobacco: Never   Tobacco comments:    Quit approximately 1993  Vaping Use   Vaping Use: Never used  Substance and Sexual Activity   Alcohol use: No   Drug use: No   Sexual activity: Not Currently    Birth control/protection: Post-menopausal  Other Topics Concern   Not on file  Social History Narrative   She is a married mother of 3, grandmother to. 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 Quit smoking in '99, denies alcohol.   She volunteers for [employer].   Social Determinants of Health   Financial Resource Strain: Not on file  Food Insecurity: Not on file  Transportation Needs: Not on file  Physical  Activity: Not on file  Stress: Not on file  Social Connections: Not on file  Intimate Partner Violence: Not on file    Outpatient Encounter Medications as of 01/16/2022  Medication Sig   acetaminophen (TYLENOL) 500 MG tablet Take 1,000 mg by mouth 2 (two) times daily as needed (pain.).   apixaban (ELIQUIS) 2.5 MG TABS tablet Take 1 tablet (2.5 mg total) by mouth 2 (two) times daily.   atorvastatin (LIPITOR) 20 MG tablet Take 20 mg by mouth at bedtime.    benazepril (LOTENSIN) 20 MG tablet Take 30 mg by mouth daily.   carvedilol (COREG) 25 MG tablet Take 1 tablet (25 mg total) by mouth 2 (two) times daily.   diclofenac Sodium (VOLTAREN) 1 % GEL Apply 2-4 g topically 2 (two) times daily as needed (back/hand pain.).    furosemide (LASIX) 40 MG tablet Take 40 mg by mouth daily  as needed for fluid.    hydrALAZINE (APRESOLINE) 100 MG tablet TAKE ONE TABLET BY MOUTH TWICE DAILY (Patient taking differently: Take 100 mg by mouth 2 (two) times daily.)   NITROSTAT 0.4 MG SL tablet DISSOLVE 1 TAB UNDER TOUNGE FOR CHEST PAIN. MAY REPEAT EVERY 5 MINUTES FOR 3 DOSES. IF NO RELIEF CALL 911 OR GO TO ER (Patient taking differently: Place 0.4 mg under the tongue every 5 (five) minutes as needed for chest pain.)   sertraline (ZOLOFT) 25 MG tablet Take 25 mg by mouth daily.   traMADol (ULTRAM) 50 MG tablet Take 50 mg by mouth every 6 (six) hours as needed for moderate pain.    No facility-administered encounter medications on file as of 01/16/2022.    Allergies  Allergen Reactions   Valium Nausea And Vomiting    Review of Systems  Constitutional:  Negative for activity change, appetite change, chills, diaphoresis, fatigue, fever, unexpected weight change and weight loss.  HENT:  Negative for congestion and ear pain.   Eyes:  Negative for pain.  Respiratory:  Negative for cough and shortness of breath.   Cardiovascular:  Negative for chest pain, palpitations and leg swelling.  Gastrointestinal:  Negative  for abdominal pain, bowel incontinence, diarrhea, nausea and vomiting.  Endocrine: Negative for polydipsia, polyphagia and polyuria.  Genitourinary:  Negative for bladder incontinence, decreased urine volume, dysuria, flank pain and pelvic pain.  Musculoskeletal:  Positive for arthralgias and back pain. Negative for gait problem, joint swelling, myalgias, neck pain and neck stiffness.  Neurological:  Negative for tingling, weakness, numbness, headaches and paresthesias.  Hematological:  Does not bruise/bleed easily.  Psychiatric/Behavioral:  Negative for confusion.   All other systems reviewed and are negative.      Objective:  BP (!) 148/67    Pulse 92    Temp 98.4 F (36.9 C)    Ht 5' 5.5" (1.664 m)    Wt 57.2 kg    SpO2 96%    BMI 20.65 kg/m    Wt Readings from Last 3 Encounters:  01/16/22 57.2 kg  11/01/21 57.4 kg  10/11/20 58.3 kg    Physical Exam Vitals and nursing note reviewed.  Constitutional:      General: She is not in acute distress.    Appearance: Normal appearance. She is normal weight. She is not ill-appearing, toxic-appearing or diaphoretic.  HENT:     Head: Normocephalic and atraumatic.     Nose: Nose normal.     Mouth/Throat:     Mouth: Mucous membranes are moist.  Eyes:     Pupils: Pupils are equal, round, and reactive to light.  Cardiovascular:     Rate and Rhythm: Normal rate. Rhythm irregularly irregular.     Heart sounds: Murmur heard.  Systolic murmur is present with a grade of 3/6.  Pulmonary:     Effort: Pulmonary effort is normal.     Breath sounds: Normal breath sounds.  Musculoskeletal:        General: Normal range of motion.     Cervical back: Normal range of motion.     Right lower leg: No edema.     Left lower leg: No edema.     Comments: Uses a walker  Skin:    General: Skin is warm and dry.     Capillary Refill: Capillary refill takes less than 2 seconds.  Neurological:     General: No focal deficit present.     Mental Status: She  is alert and oriented  to person, place, and time.  Psychiatric:        Mood and Affect: Mood normal.        Behavior: Behavior normal.        Thought Content: Thought content normal.        Judgment: Judgment normal.    Results for orders placed or performed in visit on 11/27/21  ECHOCARDIOGRAM COMPLETE  Result Value Ref Range   S' Lateral 2.80 cm   AV Area mean vel 0.94 cm2   AR max vel 0.92 cm2   AV Area VTI 0.89 cm2   Ao pk vel 3.08 m/s   AV Mean grad 22.5 mmHg   AV Peak grad 37.8 mmHg       Pertinent labs & imaging results that were available during my care of the patient were reviewed by me and considered in my medical decision making.  Assessment & Plan:  Armonie was seen today for new patient (initial visit).  Diagnoses and all orders for this visit:  Hyperlipidemia associated with type 2 diabetes mellitus (HCC) Has been well controlled.   Diet-controlled diabetes mellitus (HCC) Has been well controlled with diet. Last A1C below 6.   Persistent atrial fibrillation (HCC) On Eliquis and rate control medications, followed by cardiology.   Hypertension associated with diabetes (HCC) Has been well controlled. DASH diet and exercise encouraged.  Aortic stenosis, moderate Followed by cardiology and has repeat echo scheduled soon.   Coronary artery disease involving native coronary artery of native heart without angina pectoris Denies anginal symptoms. Followed by cardiology.  Chronic bilateral low back pain without sciatica Will refer to pain management.  - Depo-Medrol 40mg  IM given today.   -No adjustments made to antihypertensive or statin therapy today. Patient to return in 2 months for reevaluation and lab work.    Continue all other maintenance medications.  Follow up plan: Return in about 2 months (around 03/16/2022), or if symptoms worsen or fail to improve, for DM.   Continue healthy lifestyle choices, including diet (rich in fruits, vegetables, and  lean proteins, and low in salt and simple carbohydrates) and exercise (at least 30 minutes of moderate physical activity daily).  Educational handout given for back pain  The above assessment and management plan was discussed with the patient. The patient verbalized understanding of and has agreed to the management plan. Patient is aware to call the clinic if they develop any new symptoms or if symptoms persist or worsen. Patient is aware when to return to the clinic for a follow-up visit. Patient educated on when it is appropriate to go to the emergency department.   03/18/2022, NP-S  I personally was present during the history, physical exam, and medical decision-making activities of this visit and have verified that the services and findings are accurately documented in the nurse practitioner student's note.  Bard Herbert, FNP-C [facility] [address] [place], [place] [phone]

## 2022-02-03 ENCOUNTER — Encounter: Payer: Self-pay | Admitting: Physical Medicine & Rehabilitation

## 2022-03-18 ENCOUNTER — Other Ambulatory Visit: Payer: Self-pay

## 2022-03-18 ENCOUNTER — Encounter: Payer: Medicare Other | Attending: Physical Medicine & Rehabilitation | Admitting: Physical Medicine & Rehabilitation

## 2022-03-18 ENCOUNTER — Encounter: Payer: Self-pay | Admitting: Physical Medicine & Rehabilitation

## 2022-03-18 VITALS — BP 194/80 | HR 89 | Ht 63.0 in | Wt 125.0 lb

## 2022-03-18 DIAGNOSIS — G8929 Other chronic pain: Secondary | ICD-10-CM | POA: Diagnosis not present

## 2022-03-18 DIAGNOSIS — G894 Chronic pain syndrome: Secondary | ICD-10-CM | POA: Insufficient documentation

## 2022-03-18 DIAGNOSIS — Z5181 Encounter for therapeutic drug level monitoring: Secondary | ICD-10-CM | POA: Diagnosis not present

## 2022-03-18 DIAGNOSIS — M5440 Lumbago with sciatica, unspecified side: Secondary | ICD-10-CM | POA: Diagnosis not present

## 2022-03-18 DIAGNOSIS — Z79891 Long term (current) use of opiate analgesic: Secondary | ICD-10-CM | POA: Diagnosis not present

## 2022-03-18 NOTE — Progress Notes (Signed)
? ?Subjective:  ? ? Patient ID: [name], female    DOB: 11/08/1936, 86 y.o.   MRN: [id number] ? ?HPI ? 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 ?Has seen Dr Ellene Route from Neurosurgery who was reluctant to do surgery on spine due to hx of cardiac issues s/p stenting x 2  ? ?Used to see Dr Francesco Runner who did nerve blocks and burning of the nerves, which was helpful for ~29yr+ ? ?No stretching or therapy recently  ? ?Mod I with all self care and mobility but needs assist with shopping and household duties ? ?Has been on Tramadol for ~51yrs ?The patient brought a copy of MRI report lumbar ?Dated 10/29 2015. ?Normal findings T11-L1 ?L1-2 small broad-based soft disc protrusion slightly asymmetric to the right facet joint hypertrophy, ligamentum flavum hypertrophy mild spinal stenosis ?L2-3 small broad-based soft disc protrusion with hyper trophy of the ligamentum flavum as well as the sent joints creating moderately severe stenosis potentially affecting both L3 nerves  ?L3-4 slightly broad-based disc protrusion assymetric to left neuroforamen, moderate spinal stenosis due to Zjoint and ligamentum flavum hypertrophy ?L4-L5 synovial cyst of the septic joint the left side L4 through spinal stenosis ?5-S1 disc space narrowing broad-based disc asymptomatic the right foramen ?The patient has not had surgery she states that she has had injections but does not recall where.  Her former physician has moved out of town and does not have a replacement. ?Pain Inventory ?Average Pain 8 ?Pain Right Now 3 ?My pain is intermittent and aching ? ?In the last 24 hours, has pain interfered with the following? ?General activity 6 ?Relation with others 6 ?Enjoyment of life 6 ?What TIME of day is your pain at its worst? daytime ?Sleep (in general) Good ? ?Pain is worse with: standing ?Pain improves with: medication and injections ?Relief from Meds: 5 ? ?use a  cane ?use a walker ?ability to climb steps?  no ?do you drive?  no ?use a wheelchair ? ?retired ?I need assistance with the following:  household duties and shopping ? ?bladder control problems ?weakness ?numbness ?tingling ?trouble walking ?depression ? ?Any changes since last visit?  no ?New pt ? ?New pt ? ? ? ?Family History  ?Problem Relation Age of Onset  ? Cancer Mother   ? Heart disease Father   ? Heart disease Maternal Grandmother   ? Heart disease Maternal Grandfather   ? Heart disease Brother   ? Nephrolithiasis Brother   ?     accidental death  ? Heart Problems Brother   ?     CABG  ? ?Social History  ? ?Socioeconomic History  ? Marital status: Married  ?  Spouse name: Not on file  ? Number of children: Not on file  ? Years of education: Not on file  ? Highest education level: Not on file  ?Occupational History  ? Not on file  ?Tobacco Use  ? Smoking status: Former  ? Smokeless tobacco: Never  ? Tobacco comments:  ?  Quit approximately 1993  ?Vaping Use  ? Vaping Use: Never used  ?Substance and Sexual Activity  ? Alcohol use: No  ? Drug use: No  ? Sexual activity: Not Currently  ?  Birth control/protection: Post-menopausal  ?Other Topics Concern  ? Not on file  ?Social History Narrative  ? She is a married mother of 71, grandmother to. Very active around  the farm. Always on the go. Quit smoking in '99, denies alcohol.  ? She volunteers for [employer].  ? ?Social Determinants of Health  ? ?Financial Resource Strain: Not on file  ?Food Insecurity: Not on file  ?Transportation Needs: Not on file  ?Physical Activity: Not on file  ?Stress: Not on file  ?Social Connections: Not on file  ? ?Past Surgical History:  ?Procedure Laterality Date  ? ABDOMINAL HYSTERECTOMY    ? BIOPSY  06/03/2018  ? Procedure: BIOPSY;  Surgeon: Rogene Houston, MD;  [facility];  Service: Endoscopy;;  gastric ?  ? BREAST BIOPSY    ? CARDIAC CATHETERIZATION  2012  ? performed for angina  ? CARDIAC CATHETERIZATION    ?  CARDIOVERSION N/A 10/24/2020  ? Procedure: CARDIOVERSION;  Surgeon: Jerline Pain, MD;  Location: [facility] ENDOSCOPY;  Service: Cardiovascular;  Laterality: N/A;  ? CORONARY ANGIOPLASTY  11/2006  ? Cutting Balloon of stent in the RCA  ? ESOPHAGOGASTRODUODENOSCOPY N/A 06/03/2018  ? Procedure: ESOPHAGOGASTRODUODENOSCOPY (EGD);  Surgeon: Rogene Houston, MD;  [facility]: [facility];  Service: Endoscopy;  Laterality: N/A;  1:40  ? ESOPHAGOGASTRODUODENOSCOPY N/A 12/02/2018  ? Procedure: ESOPHAGOGASTRODUODENOSCOPY (EGD);  Surgeon: Rogene Houston, MD;  [facility]: [facility];  Service: Endoscopy;  Laterality: N/A;  255  ? LEFT HEART CATHETERIZATION WITH CORONARY ANGIOGRAM N/A 11/27/2011  ? Procedure: LEFT HEART CATHETERIZATION WITH CORONARY ANGIOGRAM;  Surgeon: Leonie Man, MD;  Location: [facility] CATH LAB;  Service: Cardiovascular;  Laterality: N/A;  ? PERCUTANEOUS CORONARY STENT INTERVENTION (PCI-S)  1999  ? Mid RCA - NIR DMS 3.0 mm x 30 mm, dRCA Vision BMS 2.5 mm x 15 mm   ? TRANSTHORACIC ECHOCARDIOGRAM  10/2018  ? EF 60-65%. Gr 2 DD.  No RWMA.  Mild-Mod AS  (mean gradient 19 mmHg). Mild-Mod PAH.  ? TRANSTHORACIC ECHOCARDIOGRAM  03/01/2020  ? EF 70 to 75%.  No or WMA.  Mild LVH.  GRII DD with severe LA enlargement.  Mildly elevated PAP.  Mild MR.  Moderate AS (mean gradient 17 mmHg) with dimensionless index of 0.32.  ? TUBAL LIGATION    ? ?Past Medical History:  ?Diagnosis Date  ? Anxiety   ? CAD S/P percutaneous coronary angioplasty 1990s, 11/28/2011  ?  a) midRCA PCI 1999 (NIR BMS 3.0 mm x 32 mm); distal  RCA BMS Sept 2012 (Vision BMS 2.5 mm x 15 mm); 12/02: RCA ISR --> Cutting PTCA   ? Coronary stent restenosis due to progression of disease 11/30/2011  ? Dyslipidemia, goal LDL below 70 11/28/2011  ? Essential hypertension   ? On ACE inhibitor and ARB? As well as atenolol and hydralazine  ? Gastric ulcer 11/2018  ? GERD (gastroesophageal reflux disease)   ? History of blood transfusion   ? without reaction  ? Moderate  aortic stenosis by prior echocardiogram 08/2011; 08/2013  ? Echo 11/09/18: EF 60-65%. Gr 2 DD.  No RWMA.  Mild-Mod AS (mean gradient 19 mmHg). Mild-Mod PAH. ->  Follow-up echo March 2021 showed mean gradient 17 mmHg (dimensionless index 0.32)-this was reported as moderate-severe AS  ? Myocardial infarction [facility]) 1990s  ? Had PTCA then PCI on RCA; 11/2011--> Dyspnea and Shoulder pain prior to adm   ? Non-ST elevation MI (NSTEMI) ([place]) 11/28/2011  ? Dyspnea and Shoulder pain prior to adm  ? Type 2 diabetes mellitus with complication, without long-term current use of insulin ([place])   ? no longer taking diabetic medication - diet  controlled  ? ?BP (!) 194/80   Pulse 89   Ht 5\' 3"  (1.6 m)   Wt 125 lb (56.7 kg)   SpO2 97%   BMI 22.14 kg/m?  ? ?Opioid Risk Score:   ?Fall Risk Score:  `1 ? ?Depression screen PHQ 2/9 ? ? ?  03/18/2022  ? 12:42 PM 01/16/2022  ?  9:24 AM  ?Depression screen PHQ 2/9  ?Decreased Interest 0 0  ?Down, Depressed, Hopeless 0 0  ?PHQ - 2 Score 0 0  ?Altered sleeping 0 1  ?Tired, decreased energy 0 1  ?Change in appetite 0 0  ?Feeling bad or failure about yourself  0 0  ?Trouble concentrating 0 0  ?Moving slowly or fidgety/restless 0 0  ?Suicidal thoughts 0 0  ?PHQ-9 Score 0 2  ?  ? ?Review of Systems  ?Constitutional:  Positive for unexpected weight change.  ?Musculoskeletal:  Positive for back pain.  ? ?   ?Objective:  ? Physical Exam ?Vitals and nursing note reviewed.  ?Constitutional:   ?   Appearance: She is normal weight.  ?HENT:  ?   Head: Normocephalic and atraumatic.  ?Eyes:  ?   Extraocular Movements: Extraocular movements intact.  ?   Conjunctiva/sclera: Conjunctivae normal.  ?   Pupils: Pupils are equal, round, and reactive to light.  ?Skin: ?   General: Skin is warm and dry.  ?Neurological:  ?   Mental Status: She is alert and oriented to person, place, and time.  ?   Comments: Normal strength bilateral upper and lower extremities ?5/5 bilateral deltoid, bicep, knee extensor, ankle  dorsiflexion plantarflexion ?Negative straight leg raising bilaterally ?Sensation is intact to light touch bilateral upper and lower limb ?Ambulates with a walker forward flexed posture no evidence of toe drag or knee abilit

## 2022-03-18 NOTE — Patient Instructions (Signed)
Back Exercises °These exercises help to make your trunk and back strong. They also help to keep the lower back flexible. Doing these exercises can help to prevent or lessen pain in your lower back. °If you have back pain, try to do these exercises 2-3 times each day or as told by your doctor. °As you get better, do the exercises once each day. Repeat the exercises more often as told by your doctor. °To stop back pain from coming back, do the exercises once each day, or as told by your doctor. °Do exercises exactly as told by your doctor. Stop right away if you feel sudden pain or your pain gets worse. °Exercises °Single knee to chest °Do these steps 3-5 times in a row for each leg: °Lie on your back on a firm bed or the floor with your legs stretched out. °Bring one knee to your chest. °Grab your knee or thigh with both hands and hold it in place. °Pull on your knee until you feel a gentle stretch in your lower back or butt. °Keep doing the stretch for 10-30 seconds. °Slowly let go of your leg and straighten it. °Pelvic tilt °Do these steps 5-10 times in a row: °Lie on your back on a firm bed or the floor with your legs stretched out. °Bend your knees so they point up to the ceiling. Your feet should be flat on the floor. °Tighten your lower belly (abdomen) muscles to press your lower back against the floor. This will make your tailbone point up to the ceiling instead of pointing down to your feet or the floor. °Stay in this position for 5-10 seconds while you gently tighten your muscles and breathe evenly. °Cat-cow °Do these steps until your lower back bends more easily: °Get on your hands and knees on a firm bed or the floor. Keep your hands under your shoulders, and keep your knees under your hips. You may put padding under your knees. °Let your head hang down toward your chest. Tighten (contract) the muscles in your belly. Point your tailbone toward the floor so your lower back becomes rounded like the back of a  cat. °Stay in this position for 5 seconds. °Slowly lift your head. Let the muscles of your belly relax. Point your tailbone up toward the ceiling so your back forms a sagging arch like the back of a cow. °Stay in this position for 5 seconds. ° °Press-ups °Do these steps 5-10 times in a row: °Lie on your belly (face-down) on a firm bed or the floor. °Place your hands near your head, about shoulder-width apart. °While you keep your back relaxed and keep your hips on the floor, slowly straighten your arms to raise the top half of your body and lift your shoulders. Do not use your back muscles. You may change where you place your hands to make yourself more comfortable. °Stay in this position for 5 seconds. Keep your back relaxed. °Slowly return to lying flat on the floor. ° °Bridges °Do these steps 10 times in a row: °Lie on your back on a firm bed or the floor. °Bend your knees so they point up to the ceiling. Your feet should be flat on the floor. Your arms should be flat at your sides, next to your body. °Tighten your butt muscles and lift your butt off the floor until your waist is almost as high as your knees. If you do not feel the muscles working in your butt and the back of   your thighs, slide your feet 1-2 inches (2.5-5 cm) farther away from your butt. °Stay in this position for 3-5 seconds. °Slowly lower your butt to the floor, and let your butt muscles relax. °If this exercise is too easy, try doing it with your arms crossed over your chest. °Belly crunches °Do these steps 5-10 times in a row: °Lie on your back on a firm bed or the floor with your legs stretched out. °Bend your knees so they point up to the ceiling. Your feet should be flat on the floor. °Cross your arms over your chest. °Tip your chin a little bit toward your chest, but do not bend your neck. °Tighten your belly muscles and slowly raise your chest just enough to lift your shoulder blades a tiny bit off the floor. Avoid raising your body  higher than that because it can put too much stress on your lower back. °Slowly lower your chest and your head to the floor. °Back lifts °Do these steps 5-10 times in a row: °Lie on your belly (face-down) with your arms at your sides, and rest your forehead on the floor. °Tighten the muscles in your legs and your butt. °Slowly lift your chest off the floor while you keep your hips on the floor. Keep the back of your head in line with the curve in your back. Look at the floor while you do this. °Stay in this position for 3-5 seconds. °Slowly lower your chest and your face to the floor. °Contact a doctor if: °Your back pain gets a lot worse when you do an exercise. °Your back pain does not get better within 2 hours after you exercise. °If you have any of these problems, stop doing the exercises. Do not do them again unless your doctor says it is okay. °Get help right away if: °You have sudden, very bad back pain. If this happens, stop doing the exercises. Do not do them again unless your doctor says it is okay. °This information is not intended to replace advice given to you by your health care provider. Make sure you discuss any questions you have with your health care provider. °Document Revised: 02/20/2021 Document Reviewed: 02/20/2021 °Elsevier Patient Education © 2022 [employer] Inc. ° °

## 2022-03-19 ENCOUNTER — Encounter: Payer: Self-pay | Admitting: Family Medicine

## 2022-03-19 ENCOUNTER — Ambulatory Visit (INDEPENDENT_AMBULATORY_CARE_PROVIDER_SITE_OTHER): Payer: Medicare Other | Admitting: Family Medicine

## 2022-03-19 VITALS — BP 195/95 | HR 85 | Temp 97.5°F | Ht 63.0 in | Wt 125.8 lb

## 2022-03-19 DIAGNOSIS — I152 Hypertension secondary to endocrine disorders: Secondary | ICD-10-CM

## 2022-03-19 DIAGNOSIS — E119 Type 2 diabetes mellitus without complications: Secondary | ICD-10-CM

## 2022-03-19 DIAGNOSIS — E1159 Type 2 diabetes mellitus with other circulatory complications: Secondary | ICD-10-CM | POA: Diagnosis not present

## 2022-03-19 DIAGNOSIS — E1169 Type 2 diabetes mellitus with other specified complication: Secondary | ICD-10-CM | POA: Diagnosis not present

## 2022-03-19 DIAGNOSIS — I4819 Other persistent atrial fibrillation: Secondary | ICD-10-CM | POA: Diagnosis not present

## 2022-03-19 DIAGNOSIS — E785 Hyperlipidemia, unspecified: Secondary | ICD-10-CM

## 2022-03-19 LAB — BAYER DCA HB A1C WAIVED: HB A1C (BAYER DCA - WAIVED): 5.3 % (ref 4.8–5.6)

## 2022-03-19 MED ORDER — BENAZEPRIL HCL 20 MG PO TABS: 20.0000 mg | ORAL_TABLET | Freq: Every day | ORAL | 3 refills | Status: DC

## 2022-03-19 NOTE — Progress Notes (Addendum)
?  ? ?Subjective:  ?Patient ID: [name], female    DOB: 1936-10-21, 86 y.o.   MRN: [id number] ? ?Patient Care Team: ?Sonny Masters, FNP as PCP - General (Family Medicine) ?Marykay Lex, MD as PCP - Cardiology (Cardiology)  ? ?Chief Complaint:  Diabetes (2 month follow up) ? ? ?HPI: ?[name] is a 86 y.o. female presenting on 03/19/2022 for Diabetes (2 month follow up) ? ?Pt presents for 2 month follow up. She was seen by physical therapy yesterday.  ? ?Diabetes ?Pertinent negatives for diabetes include no polydipsia, no polyphagia and no polyuria.  ?1. Diet-controlled diabetes mellitus (HCC) ?A1C is 5.3 in office today. She is due an exam. She does not check her feet daily.  ? ?2. Hyperlipidemia associated with type 2 diabetes mellitus (HCC) ?Managed with atorvastatin.  ? ?3. Hypertension associated with diabetes (HCC) ?Patient is prescribed Coreg by her cardiologist for rate control. She is taking hydralazine as previous prescribed by Dr. Neita Carp.  ? ? ?Relevant past medical, surgical, family, and social history reviewed and updated as indicated.  ?Allergies and medications reviewed and updated. Data reviewed: Chart in Epic. ? ? ?Past Medical History:  ?Diagnosis Date  ? Anxiety   ? CAD S/P percutaneous coronary angioplasty 1990s, 11/28/2011  ?  a) midRCA PCI 1999 (NIR BMS 3.0 mm x 32 mm); distal  RCA BMS Sept 2012 (Vision BMS 2.5 mm x 15 mm); 12/02: RCA ISR --> Cutting PTCA   ? Coronary stent restenosis due to progression of disease 11/30/2011  ? Dyslipidemia, goal LDL below 70 11/28/2011  ? Essential hypertension   ? On ACE inhibitor and ARB? As well as atenolol and hydralazine  ? Gastric ulcer 11/2018  ? GERD (gastroesophageal reflux disease)   ? History of blood transfusion   ? without reaction  ? Moderate aortic stenosis by prior echocardiogram 08/2011; 08/2013  ? Echo 11/09/18: EF 60-65%. Gr 2 DD.  No RWMA.  Mild-Mod AS (mean gradient 19 mmHg). Mild-Mod PAH. ->  Follow-up echo March 2021  showed mean gradient 17 mmHg (dimensionless index 0.32)-this was reported as moderate-severe AS  ? Myocardial infarction [facility]) 1990s  ? Had PTCA then PCI on RCA; 11/2011--> Dyspnea and Shoulder pain prior to adm   ? Non-ST elevation MI (NSTEMI) (H[facility]) 11/28/2011  ? Dyspnea and Shoulder pain prior to adm  ? Type 2 diabetes mellitus with complication, without long-term current use of insulin (HCC)   ? no longer taking diabetic medication - diet controlled  ? ? ?Past Surgical History:  ?Procedure Laterality Date  ? ABDOMINAL HYSTERECTOMY    ? BIOPSY  06/03/2018  ? Procedure: BIOPSY;  Surgeon: Malissa Hippo, MD;  [facility];  Service: Endoscopy;;  gastric ?  ? BREAST BIOPSY    ? CARDIAC CATHETERIZATION  2012  ? performed for angina  ? CARDIAC CATHETERIZATION    ? CARDIOVERSION N/A 10/24/2020  ? Procedure: CARDIOVERSION;  Surgeon: Jake Bathe, MD;  Location: [facility] ENDOSCOPY;  Service: Cardiovascular;  Laterality: N/A;  ? CORONARY ANGIOPLASTY  11/2006  ? Cutting Balloon of stent in the RCA  ? ESOPHAGOGASTRODUODENOSCOPY N/A 06/03/2018  ? Procedure: ESOPHAGOGASTRODUODENOSCOPY (EGD);  Surgeon: Malissa Hippo, MD;  [facility]: [facility];  Service: Endoscopy;  Laterality: N/A;  1:40  ? ESOPHAGOGASTRODUODENOSCOPY N/A 12/02/2018  ? Procedure: ESOPHAGOGASTRODUODENOSCOPY (EGD);  Surgeon: Malissa Hippo, MD;  [facility];  Service: Endoscopy;  Laterality: N/A;  255  ? LEFT HEART CATHETERIZATION WITH  CORONARY ANGIOGRAM N/A 11/27/2011  ? Procedure: LEFT HEART CATHETERIZATION WITH CORONARY ANGIOGRAM;  Surgeon: Marykay Lexavid W Harding, MD;  Location[facility] CATH LAB;  Service: Cardiovascular;  Laterality: N/A;  ? PERCUTANEOUS CORONARY STENT INTERVENTION (PCI-S)  1999  ? Mid RCA - NIR DMS 3.0 mm x 30 mm, dRCA Vision BMS 2.5 mm x 15 mm   ? TRANSTHORACIC ECHOCARDIOGRAM  10/2018  ? EF 60-65%. Gr 2 DD.  No RWMA.  Mild-Mod AS  (mean gradient 19 mmHg). Mild-Mod PAH.  ? TRANSTHORACIC ECHOCARDIOGRAM  03/01/2020  ? EF 70 to 75%.   No or WMA.  Mild LVH.  GRII DD with severe LA enlargement.  Mildly elevated PAP.  Mild MR.  Moderate AS (mean gradient 17 mmHg) with dimensionless index of 0.32.  ? TUBAL LIGATION    ? ? ?Social History  ? ?Socioeconomic History  ? Marital status: Married  ?  Spouse name: Not on file  ? Number of children: Not on file  ? Years of education: Not on file  ? Highest education level: Not on file  ?Occupational History  ? Not on file  ?Tobacco Use  ? Smoking status: Former  ? Smokeless tobacco: Never  ? Tobacco comments:  ?  Quit approximately 1993  ?Vaping Use  ? Vaping Use: Never used  ?Substance and Sexual Activity  ? Alcohol use: No  ? Drug use: No  ? Sexual activity: Not Currently  ?  Birth control/protection: Post-menopausal  ?Other Topics Concern  ? Not on file  ?Social History Narrative  ? She is a married mother of 3, grandmother to. 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 Quit smoking in '99, denies alcohol.  ? She volunteers for [employer].  ? ?Social Determinants of Health  ? ?Financial Resource Strain: Not on file  ?Food Insecurity: Not on file  ?Transportation Needs: Not on file  ?Physical Activity: Not on file  ?Stress: Not on file  ?Social Connections: Not on file  ?Intimate Partner Violence: Not on file  ? ? ?Outpatient Encounter Medications as of 03/19/2022  ?Medication Sig  ? acetaminophen (TYLENOL) 500 MG tablet Take 1,000 mg by mouth 2 (two) times daily as needed (pain.).  ? apixaban (ELIQUIS) 2.5 MG TABS tablet Take 1 tablet (2.5 mg total) by mouth 2 (two) times daily.  ? atorvastatin (LIPITOR) 20 MG tablet Take 20 mg by mouth at bedtime.   ? benazepril (LOTENSIN) 20 MG tablet Take 30 mg by mouth daily. (Patient not taking: Reported on 03/18/2022)  ? carvedilol (COREG) 25 MG tablet Take 1 tablet (25 mg total) by mouth 2 (two) times daily.  ? diclofenac Sodium (VOLTAREN) 1 % GEL Apply 2-4 g topically 2 (two) times daily as needed (back/hand pain.).   ? furosemide (LASIX) 40 MG tablet Take  40 mg by mouth daily as needed for fluid.  (Patient not taking: Reported on 03/18/2022)  ? hydrALAZINE (APRESOLINE) 100 MG tablet TAKE ONE TABLET BY MOUTH TWICE DAILY (Patient taking differently: Take 100 mg by mouth 2 (two) times daily.)  ? NITROSTAT 0.4 MG SL tablet DISSOLVE 1 TAB UNDER TOUNGE FOR CHEST PAIN. MAY REPEAT EVERY 5 MINUTES FOR 3 DOSES. IF NO RELIEF CALL 911 OR GO TO ER (Patient taking differently: Place 0.4 mg under the tongue every 5 (five) minutes as needed for chest pain.)  ? sertraline (ZOLOFT) 25 MG tablet Take 25 mg by mouth daily.  ? traMADol (ULTRAM) 50 MG tablet Take 50 mg by mouth every  6 (six) hours as needed for moderate pain.   ? ?No facility-administered encounter medications on file as of 03/19/2022.  ? ? ?Allergies  ?Allergen Reactions  ? Valium Nausea And Vomiting  ? ? ?Review of Systems  ?Eyes:  Negative for visual disturbance.  ?Respiratory:  Negative for chest tightness and shortness of breath.   ?Cardiovascular:  Negative for leg swelling.  ?Endocrine: Negative for polydipsia, polyphagia and polyuria.  ?All other systems reviewed and are negative. ? ?   ? ?Objective:  ?Ht 5\' 3"  (1.6 m)   BMI 22.14 kg/m?   ? ?Wt Readings from Last 3 Encounters:  ?03/18/22 56.7 kg  ?01/16/22 57.2 kg  ?11/01/21 57.4 kg  ? ? ?Physical Exam ?Vitals and nursing note reviewed.  ?Constitutional:   ?   Appearance: Normal appearance.  ?Neck:  ?   Vascular: Carotid bruit present.  ?Cardiovascular:  ?   Rate and Rhythm: Normal rate. Rhythm irregular.  ?Pulmonary:  ?   Effort: Pulmonary effort is normal.  ?   Breath sounds: Normal breath sounds.  ?Musculoskeletal:  ?   Cervical back: Normal range of motion.  ?   Comments: Ambulates with walker ?  ?Skin: ?   General: Skin is cool and dry.  ?Neurological:  ?   General: No focal deficit present.  ?   Mental Status: She is alert and oriented to person, place, and time. Mental status is at baseline.  ?Psychiatric:     ?   Mood and Affect: Mood normal.     ?    Behavior: Behavior normal.     ?   Thought Content: Thought content normal.     ?   Judgment: Judgment normal.  ? ? ?Results for orders placed or performed in visit on 11/27/21  ?ECHOCARDIOGRAM COMPLETE  ?Resu

## 2022-03-19 NOTE — Patient Instructions (Addendum)
Goal BP:  ?For patients younger than 60: Goal BP < 140/90. ?For patients 60 and older: Goal BP < 150/90. ?For patients with diabetes: Goal BP < 140/90. ? ?Take your medications faithfully as prescribed: ? ?Lotensin 20 mg daily ?Hydralazine 100 mg daily ?Coreg 25 mg twice daily ? ?Maintain a healthy weight. ?Get at least 150 minutes of aerobic exercise per week. ?Minimize salt intake, less than 2000 mg per day. ?Minimize alcohol intake. ? ?DASH Eating Plan ?DASH stands for "Dietary Approaches to Stop Hypertension." The DASH eating plan is a healthy eating plan that has been shown to reduce high blood pressure (hypertension). Additional health benefits may include reducing the risk of type 2 diabetes mellitus, heart disease, and stroke. The DASH eating plan may also help with weight loss. ? ?WHAT DO I NEED TO KNOW ABOUT THE DASH EATING PLAN? ?For the DASH eating plan, you will follow these general guidelines: ?Choose foods with a percent daily value for sodium of less than 5% (as listed on the food label). ?Use salt-free seasonings or herbs instead of table salt or sea salt. ?Check with your health care provider or pharmacist before using salt substitutes. ?Eat lower-sodium products, often labeled as "lower sodium" or "no salt added." ?Eat fresh foods. ?Eat more vegetables, fruits, and low-fat dairy products. ?Choose whole grains. Look for the word "whole" as the first word in the ingredient list. ?Choose fish and skinless chicken or [place] more often than red meat. Limit fish, poultry, and meat to 6 oz (170 g) each day. ?Limit sweets, desserts, sugars, and sugary drinks. ?Choose heart-healthy fats. ?Limit cheese to 1 oz (28 g) per day. ?Eat more home-cooked food and less restaurant, buffet, and fast food. ?Limit fried foods. ?Cook foods using methods other than frying. ?Limit canned vegetables. If you do use them, rinse them well to decrease the sodium. ?When eating at a restaurant, ask that your food be prepared  with less salt, or no salt if possible. ? ?WHAT FOODS CAN I EAT? ?Seek help from a dietitian for individual calorie needs. ? ?Grains ?Whole grain or whole wheat bread. Brown rice. Whole grain or whole wheat pasta. Quinoa, bulgur, and whole grain cereals. Low-sodium cereals. Corn or whole wheat flour tortillas. Whole grain cornbread. Whole grain crackers. Low-sodium crackers. ? ?Vegetables ?Fresh or frozen vegetables (raw, steamed, roasted, or grilled). Low-sodium or reduced-sodium tomato and vegetable juices. Low-sodium or reduced-sodium tomato sauce and paste. Low-sodium or reduced-sodium canned vegetables.  ? ?Fruits ?All fresh, canned (in natural juice), or frozen fruits. ? ?Meat and Other Protein Products ?Ground beef (85% or leaner), grass-fed beef, or beef trimmed of fat. Skinless chicken or [place]. Ground chicken or [place]. Pork trimmed of fat. All fish and seafood. Eggs. Dried beans, peas, or lentils. Unsalted nuts and seeds. Unsalted canned beans. ? ?Dairy ?Low-fat dairy products, such as skim or 1% milk, 2% or reduced-fat cheeses, low-fat ricotta or cottage cheese, or plain low-fat yogurt. Low-sodium or reduced-sodium cheeses. ? ?Fats and Oils ?Tub margarines without trans fats. Light or reduced-fat mayonnaise and salad dressings (reduced sodium). Avocado. Safflower, olive, or canola oils. Natural peanut or almond butter. ? ?Other ?Unsalted popcorn and pretzels. ?The items listed above may not be a complete list of recommended foods or beverages. Contact your dietitian for more options. ? ?WHAT FOODS ARE NOT RECOMMENDED? ? ?Grains ?White bread. White pasta. White rice. Refined cornbread. Bagels and croissants. Crackers that contain trans fat. ? ?Vegetables ?Creamed or fried vegetables. Vegetables in a cheese sauce.  Regular canned vegetables. Regular canned tomato sauce and paste. Regular tomato and vegetable juices. ? ?Fruits ?Dried fruits. Canned fruit in light or heavy syrup. Fruit juice. ? ?Meat and  Other Protein Products ?Fatty cuts of meat. Ribs, chicken wings, bacon, sausage, bologna, salami, chitterlings, fatback, hot dogs, bratwurst, and packaged luncheon meats. Salted nuts and seeds. Canned beans with salt. ? ?Dairy ?Whole or 2% milk, cream, half-and-half, and cream cheese. Whole-fat or sweetened yogurt. Full-fat cheeses or blue cheese. Nondairy creamers and whipped toppings. Processed cheese, cheese spreads, or cheese curds. ? ?Condiments ?Onion and garlic salt, seasoned salt, table salt, and sea salt. Canned and packaged gravies. Worcestershire sauce. Tartar sauce. Barbecue sauce. Teriyaki sauce. Soy sauce, including reduced sodium. Steak sauce. Fish sauce. Oyster sauce. Cocktail sauce. Horseradish. Ketchup and mustard. Meat flavorings and tenderizers. Bouillon cubes. Hot sauce. Tabasco sauce. Marinades. Taco seasonings. Relishes. ? ?Fats and Oils ?Butter, stick margarine, lard, shortening, ghee, and bacon fat. Coconut, palm kernel, or palm oils. Regular salad dressings. ? ?Other ?Pickles and olives. Salted popcorn and pretzels. ? ?The items listed above may not be a complete list of foods and beverages to avoid. Contact your dietitian for more information. ? ?WHERE CAN I FIND MORE INFORMATION? ?National [employer], [employer], [employer] Institute: travelstabloid.com ?Document Released: 11/27/2011 Document Revised: 04/24/2014 Document Reviewed: 10/12/2013 ?ExitCare? Patient Information ?2015 [employer]. This information is not intended to replace advice given to you by your health care provider. Make sure you discuss any questions you have with your health care provider. ? ? ?I think that you would greatly benefit from seeing a nutritionist.  If you are interested, please call Dr. Jenne Campus at [phone] to schedule an appointment. ? ?

## 2022-03-20 LAB — CMP14+EGFR
ALT: 13 IU/L (ref 0–32)
AST: 16 IU/L (ref 0–40)
Albumin/Globulin Ratio: 2 (ref 1.2–2.2)
Albumin: 4.7 g/dL — ABNORMAL HIGH (ref 3.6–4.6)
Alkaline Phosphatase: 64 IU/L (ref 44–121)
BUN/Creatinine Ratio: 33 — ABNORMAL HIGH (ref 12–28)
BUN: 24 mg/dL (ref 8–27)
Bilirubin Total: 0.5 mg/dL (ref 0.0–1.2)
CO2: 24 mmol/L (ref 20–29)
Calcium: 9.7 mg/dL (ref 8.7–10.3)
Chloride: 104 mmol/L (ref 96–106)
Creatinine, Ser: 0.73 mg/dL (ref 0.57–1.00)
Globulin, Total: 2.3 g/dL (ref 1.5–4.5)
Glucose: 101 mg/dL — ABNORMAL HIGH (ref 70–99)
Potassium: 4.2 mmol/L (ref 3.5–5.2)
Sodium: 144 mmol/L (ref 134–144)
Total Protein: 7 g/dL (ref 6.0–8.5)
eGFR: 81 mL/min/{1.73_m2} (ref >59)

## 2022-03-20 LAB — LIPID PANEL
Chol/HDL Ratio: 2 ratio (ref 0.0–4.4)
Cholesterol, Total: 137 mg/dL (ref 100–199)
HDL: 69 mg/dL (ref >39)
LDL Chol Calc (NIH): 50 mg/dL (ref 0–99)
Triglycerides: 96 mg/dL (ref 0–149)
VLDL Cholesterol Cal: 18 mg/dL (ref 5–40)

## 2022-03-20 LAB — CBC WITH DIFFERENTIAL/PLATELET
Basophils Absolute: 0 10*3/uL (ref 0.0–0.2)
Basos: 1 %
EOS (ABSOLUTE): 0.2 10*3/uL (ref 0.0–0.4)
Eos: 4 %
Hematocrit: 36.1 % (ref 34.0–46.6)
Hemoglobin: 12.3 g/dL (ref 11.1–15.9)
Immature Grans (Abs): 0 10*3/uL (ref 0.0–0.1)
Immature Granulocytes: 0 %
Lymphocytes Absolute: 1.4 10*3/uL (ref 0.7–3.1)
Lymphs: 29 %
MCH: 33.2 pg — ABNORMAL HIGH (ref 26.6–33.0)
MCHC: 34.1 g/dL (ref 31.5–35.7)
MCV: 98 fL — ABNORMAL HIGH (ref 79–97)
Monocytes Absolute: 0.5 10*3/uL (ref 0.1–0.9)
Monocytes: 9 %
Neutrophils Absolute: 2.9 10*3/uL (ref 1.4–7.0)
Neutrophils: 57 %
Platelets: 192 10*3/uL (ref 150–450)
RBC: 3.7 x10E6/uL — ABNORMAL LOW (ref 3.77–5.28)
RDW: 12 % (ref 11.7–15.4)
WBC: 5 10*3/uL (ref 3.4–10.8)

## 2022-03-23 LAB — TOXASSURE SELECT,+ANTIDEPR,UR

## 2022-04-01 ENCOUNTER — Telehealth: Payer: Self-pay | Admitting: *Deleted

## 2022-04-01 NOTE — Telephone Encounter (Signed)
Urine drug screen for this encounter is consistent for prescribed medication 

## 2022-04-03 ENCOUNTER — Other Ambulatory Visit: Payer: Self-pay | Admitting: Family Medicine

## 2022-04-11 ENCOUNTER — Ambulatory Visit (HOSPITAL_COMMUNITY)
Admission: RE | Admit: 2022-04-11 | Discharge: 2022-04-11 | Disposition: A | Discharge status: Home / Self Care | Payer: Medicare Other | Source: Ambulatory Visit | Attending: Physical Medicine & Rehabilitation | Admitting: Physical Medicine & Rehabilitation

## 2022-04-11 DIAGNOSIS — G8929 Other chronic pain: Secondary | ICD-10-CM | POA: Insufficient documentation

## 2022-04-11 DIAGNOSIS — M4126 Other idiopathic scoliosis, lumbar region: Secondary | ICD-10-CM | POA: Diagnosis not present

## 2022-04-11 DIAGNOSIS — M5440 Lumbago with sciatica, unspecified side: Secondary | ICD-10-CM | POA: Insufficient documentation

## 2022-04-11 DIAGNOSIS — M545 Low back pain, unspecified: Secondary | ICD-10-CM | POA: Diagnosis not present

## 2022-04-11 DIAGNOSIS — M5136 Other intervertebral disc degeneration, lumbar region: Secondary | ICD-10-CM | POA: Diagnosis not present

## 2022-04-11 DIAGNOSIS — M4316 Spondylolisthesis, lumbar region: Secondary | ICD-10-CM | POA: Diagnosis not present

## 2022-04-18 ENCOUNTER — Telehealth: Payer: Self-pay | Admitting: *Deleted

## 2022-04-18 NOTE — Telephone Encounter (Addendum)
Notified. ? 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 ? ? ?

## 2022-05-01 ENCOUNTER — Ambulatory Visit (INDEPENDENT_AMBULATORY_CARE_PROVIDER_SITE_OTHER): Payer: Medicare Other | Admitting: Family Medicine

## 2022-05-01 ENCOUNTER — Encounter: Payer: Self-pay | Admitting: Family Medicine

## 2022-05-01 VITALS — BP 190/68 | HR 85 | Temp 98.6°F | Ht 63.0 in | Wt 124.0 lb

## 2022-05-01 DIAGNOSIS — E1159 Type 2 diabetes mellitus with other circulatory complications: Secondary | ICD-10-CM

## 2022-05-01 DIAGNOSIS — M5441 Lumbago with sciatica, right side: Secondary | ICD-10-CM

## 2022-05-01 DIAGNOSIS — G8929 Other chronic pain: Secondary | ICD-10-CM | POA: Diagnosis not present

## 2022-05-01 DIAGNOSIS — I152 Hypertension secondary to endocrine disorders: Secondary | ICD-10-CM

## 2022-05-01 DIAGNOSIS — M545 Low back pain, unspecified: Secondary | ICD-10-CM | POA: Insufficient documentation

## 2022-05-01 MED ORDER — BENAZEPRIL HCL 40 MG PO TABS: 40.0000 mg | ORAL_TABLET | Freq: Every day | ORAL | 3 refills | Status: DC

## 2022-05-01 MED ORDER — TRAMADOL HCL 50 MG PO TABS: 50.0000 mg | ORAL_TABLET | Freq: Four times a day (QID) | ORAL | 1 refills | Status: DC | PRN

## 2022-05-01 NOTE — Progress Notes (Signed)
?  ? ?Subjective:  ?Patient ID: [name], female    DOB: 01-05-1936, 86 y.o.   MRN: [id number] ? ?Patient Care Team: ?Sonny Masters, FNP as PCP - General (Family Medicine) ?Marykay Lex, MD as PCP - Cardiology (Cardiology)  ? ?Chief Complaint:  Hypertension (B/P check) ? ? ?HPI: ?[name] is a 86 y.o. female presenting on 05/01/2022 for Hypertension (B/P check) ? ? ?Hypertension ?This is a chronic problem. The current episode started more than 1 year ago. The problem has been waxing and waning since onset. The problem is uncontrolled. Pertinent negatives include no anxiety, blurred vision, chest pain, headaches, malaise/fatigue, neck pain, orthopnea, palpitations, peripheral edema, PND, shortness of breath or sweats. Past treatments include beta blockers, diuretics, angiotensin blockers and direct vasodilators. The current treatment provides mild improvement. There are no compliance problems.   ?Back Pain ?This is a chronic problem. The current episode started more than 1 year ago. The problem occurs constantly. The problem has been waxing and waning since onset. The pain is present in the gluteal, lumbar spine and sacro-iliac. The quality of the pain is described as aching, burning and shooting. The pain radiates to the left thigh and right thigh. The symptoms are aggravated by bending, position, sitting, standing, stress and twisting. Associated symptoms include leg pain. Pertinent negatives include no abdominal pain, bladder incontinence, bowel incontinence, chest pain, dysuria, fever, headaches, numbness, paresis, paresthesias, pelvic pain, perianal numbness, tingling, weakness or weight loss. She has been seen by pain management and is scheduled to have injections on 05/13/2022. She has been trying to manage pain with topical Voltaren and tylenol without relief of symptoms. Pain management has not prescribed anything for pain management prior to procedure.  ? ? ?Relevant past medical,  surgical, family, and social history reviewed and updated as indicated.  ?Allergies and medications reviewed and updated. Data reviewed: Chart in Epic. ? ? ?Past Medical History:  ?Diagnosis Date  ? Anxiety   ? CAD S/P percutaneous coronary angioplasty 1990s, 11/28/2011  ?  a) midRCA PCI 1999 (NIR BMS 3.0 mm x 32 mm); distal  RCA BMS Sept 2012 (Vision BMS 2.5 mm x 15 mm); 12/02: RCA ISR --> Cutting PTCA   ? Coronary stent restenosis due to progression of disease 11/30/2011  ? Dyslipidemia, goal LDL below 70 11/28/2011  ? Essential hypertension   ? On ACE inhibitor and ARB? As well as atenolol and hydralazine  ? Gastric ulcer 11/2018  ? GERD (gastroesophageal reflux disease)   ? History of blood transfusion   ? without reaction  ? Moderate aortic stenosis by prior echocardiogram 08/2011; 08/2013  ? Echo 11/09/18: EF 60-65%. Gr 2 DD.  No RWMA.  Mild-Mod AS (mean gradient 19 mmHg). Mild-Mod PAH. ->  Follow-up echo March 2021 showed mean gradient 17 mmHg (dimensionless index 0.32)-this was reported as moderate-severe AS  ? Myocardial infarction [facility]) 1990s  ? Had PTCA then PCI on RCA; 11/2011--> Dyspnea and Shoulder pain prior to adm   ? Non-ST elevation MI (NSTEMI) (HCC) 11/28/2011  ? Dyspnea and Shoulder pain prior to adm  ? Type 2 diabetes mellitus with complication, without long-term current use of insulin (HCC)   ? no longer taking diabetic medication - diet controlled  ? ? ?Past Surgical History:  ?Procedure Laterality Date  ? ABDOMINAL HYSTERECTOMY    ? BIOPSY  06/03/2018  ? Procedure: BIOPSY;  Surgeon: Malissa Hippo, MD;  Location: [facility];  Service: Endoscopy;;  gastric ?  ?  BREAST BIOPSY    ? CARDIAC CATHETERIZATION  2012  ? performed for angina  ? CARDIAC CATHETERIZATION    ? CARDIOVERSION N/A 10/24/2020  ? Procedure: CARDIOVERSION;  Surgeon: Jake Bathe, MD;  Location: [facility] ENDOSCOPY;  Service: Cardiovascular;  Laterality: N/A;  ? CORONARY ANGIOPLASTY  11/2006  ? Cutting Balloon of stent in the RCA  ?  ESOPHAGOGASTRODUODENOSCOPY N/A 06/03/2018  ? Procedure: ESOPHAGOGASTRODUODENOSCOPY (EGD);  Surgeon: Malissa Hippo, MD;  Location: [facility];  Service: Endoscopy;  Laterality: N/A;  1:40  ? ESOPHAGOGASTRODUODENOSCOPY N/A 12/02/2018  ? Procedure: ESOPHAGOGASTRODUODENOSCOPY (EGD);  Surgeon: Malissa Hippo, MD;  Location: [facility];  Service: Endoscopy;  Laterality: N/A;  255  ? LEFT HEART CATHETERIZATION WITH CORONARY ANGIOGRAM N/A 11/27/2011  ? Procedure: LEFT HEART CATHETERIZATION WITH CORONARY ANGIOGRAM;  Surgeon: Marykay Lex, MD;  Location: [facility] CATH LAB;  Service: Cardiovascular;  Laterality: N/A;  ? PERCUTANEOUS CORONARY STENT INTERVENTION (PCI-S)  1999  ? Mid RCA - NIR DMS 3.0 mm x 30 mm, dRCA Vision BMS 2.5 mm x 15 mm   ? TRANSTHORACIC ECHOCARDIOGRAM  10/2018  ? EF 60-65%. Gr 2 DD.  No RWMA.  Mild-Mod AS  (mean gradient 19 mmHg). Mild-Mod PAH.  ? TRANSTHORACIC ECHOCARDIOGRAM  03/01/2020  ? EF 70 to 75%.  No or WMA.  Mild LVH.  GRII DD with severe LA enlargement.  Mildly elevated PAP.  Mild MR.  Moderate AS (mean gradient 17 mmHg) with dimensionless index of 0.32.  ? TUBAL LIGATION    ? ? ?Social History  ? ?Socioeconomic History  ? Marital status: Married  ?  Spouse name: Not on file  ? Number of children: Not on file  ? Years of education: Not on file  ? Highest education level: Not on file  ?Occupational History  ? Not on file  ?Tobacco Use  ? Smoking status: Former  ? Smokeless tobacco: Never  ? Tobacco comments:  ?  Quit approximately 1993  ?Vaping Use  ? Vaping Use: Never used  ?Substance and Sexual Activity  ? Alcohol use: No  ? Drug use: No  ? Sexual activity: Not Currently  ?  Birth control/protection: Post-menopausal  ?Other Topics Concern  ? Not on file  ?Social History Narrative  ? She is a married mother of 3, grandmother to. 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 Quit smoking in '99, denies alcohol.  ? She volunteers for [employer].  ? ?Social Determinants of Health   ? ?Financial Resource Strain: Not on file  ?Food Insecurity: Not on file  ?Transportation Needs: Not on file  ?Physical Activity: Not on file  ?Stress: Not on file  ?Social Connections: Not on file  ?Intimate Partner Violence: Not on file  ? ? ?Outpatient Encounter Medications as of 05/01/2022  ?Medication Sig  ? acetaminophen (TYLENOL) 500 MG tablet Take 1,000 mg by mouth 2 (two) times daily as needed (pain.).  ? apixaban (ELIQUIS) 2.5 MG TABS tablet Take 1 tablet (2.5 mg total) by mouth 2 (two) times daily.  ? atorvastatin (LIPITOR) 20 MG tablet Take 20 mg by mouth at bedtime.   ? benazepril (LOTENSIN) 40 MG tablet Take 1 tablet (40 mg total) by mouth daily.  ? carvedilol (COREG) 25 MG tablet Take 1 tablet (25 mg total) by mouth 2 (two) times daily.  ? diclofenac Sodium (VOLTAREN) 1 % GEL Apply 2-4 g topically 2 (two) times daily as needed (back/hand pain.).   ? furosemide (LASIX)  40 MG tablet Take 40 mg by mouth daily as needed for fluid.  ? hydrALAZINE (APRESOLINE) 100 MG tablet TAKE ONE TABLET BY MOUTH TWICE DAILY (Patient taking differently: Take 100 mg by mouth 2 (two) times daily.)  ? NITROSTAT 0.4 MG SL tablet DISSOLVE 1 TAB UNDER TOUNGE FOR CHEST PAIN. MAY REPEAT EVERY 5 MINUTES FOR 3 DOSES. IF NO RELIEF CALL 911 OR GO TO ER (Patient taking differently: Place 0.4 mg under the tongue every 5 (five) minutes as needed for chest pain.)  ? sertraline (ZOLOFT) 25 MG tablet TAKE ONE (1) TABLET BY MOUTH EVERY DAY  ? [DISCONTINUED] benazepril (LOTENSIN) 20 MG tablet Take 30 mg by mouth daily.  ? [DISCONTINUED] benazepril (LOTENSIN) 20 MG tablet Take 1 tablet (20 mg total) by mouth daily.  ? [DISCONTINUED] traMADol (ULTRAM) 50 MG tablet Take 50 mg by mouth every 6 (six) hours as needed for moderate pain.   ? traMADol (ULTRAM) 50 MG tablet Take 1 tablet (50 mg total) by mouth every 6 (six) hours as needed for moderate pain.  ? ?No facility-administered encounter medications on file as of 05/01/2022.  ? ? ?Allergies   ?Allergen Reactions  ? Valium Nausea And Vomiting  ? ? ?Review of Systems  ?Constitutional:  Negative for activity change, appetite change, chills, diaphoresis, fatigue, fever, malaise/fatigue, unexpected weight c

## 2022-05-01 NOTE — Patient Instructions (Signed)

## 2022-05-02 LAB — BMP8+EGFR
BUN/Creatinine Ratio: 25 (ref 12–28)
BUN: 16 mg/dL (ref 8–27)
CO2: 26 mmol/L (ref 20–29)
Calcium: 9.7 mg/dL (ref 8.7–10.3)
Chloride: 101 mmol/L (ref 96–106)
Creatinine, Ser: 0.64 mg/dL (ref 0.57–1.00)
Glucose: 115 mg/dL — ABNORMAL HIGH (ref 70–99)
Potassium: 4.2 mmol/L (ref 3.5–5.2)
Sodium: 141 mmol/L (ref 134–144)
eGFR: 87 mL/min/{1.73_m2} (ref >59)

## 2022-05-13 ENCOUNTER — Encounter: Payer: Medicare Other | Admitting: Physical Medicine & Rehabilitation

## 2022-05-13 ENCOUNTER — Encounter: Payer: Self-pay | Admitting: Physical Medicine & Rehabilitation

## 2022-05-13 DIAGNOSIS — G8929 Other chronic pain: Secondary | ICD-10-CM

## 2022-05-13 NOTE — Progress Notes (Deleted)
  PROCEDURE RECORD [facility]   Name: [name] DOB:[birth year] MRN: [id number]  Date:05/13/2022  Physician: Claudette Laws, MD    Nurse/CMA: Becky Sax  Allergies:  Allergies  Allergen Reactions   Valium Nausea And Vomiting    Consent Signed: {yes [id number]}  Is patient diabetic? {yes no:314532}  CBG today? ***  Pregnant: No. LMP: No LMP recorded. Patient has had a hysterectomy. (age 86-55)  Anticoagulants: {Yes/No:19989} Anti-inflammatory: {Yes/No:19989} Antibiotics: {Yes/No:19989}  Procedure: ***  Position: Prone Start Time: ***  End Time: ***  Fluoro Time: ***  RN/CMA  Shakina Choy MA    Time      BP      Pulse      Respirations      O2 Sat      S/S      Pain Level       D/C home with ***, patient A & O X 3, D/C instructions reviewed, and sits independently.

## 2022-05-13 NOTE — Progress Notes (Signed)
Elevated BP, > 200 systolic, pt will call PCP, reschedule procedure

## 2022-05-16 ENCOUNTER — Other Ambulatory Visit: Payer: Self-pay | Admitting: Family Medicine

## 2022-05-16 DIAGNOSIS — G8929 Other chronic pain: Secondary | ICD-10-CM

## 2022-05-20 ENCOUNTER — Ambulatory Visit (INDEPENDENT_AMBULATORY_CARE_PROVIDER_SITE_OTHER): Payer: Medicare Other | Admitting: Family Medicine

## 2022-05-20 ENCOUNTER — Encounter: Payer: Self-pay | Admitting: Family Medicine

## 2022-05-20 VITALS — BP 108/58 | HR 72 | Temp 98.1°F | Wt 126.0 lb

## 2022-05-20 DIAGNOSIS — E1159 Type 2 diabetes mellitus with other circulatory complications: Secondary | ICD-10-CM | POA: Diagnosis not present

## 2022-05-20 DIAGNOSIS — I152 Hypertension secondary to endocrine disorders: Secondary | ICD-10-CM | POA: Diagnosis not present

## 2022-05-20 NOTE — Progress Notes (Signed)
Subjective:  Patient ID: [name], female    DOB: 11/16/1936, 86 y.o.   MRN: [id number]  Patient Care Team: Baruch Gouty, FNP as PCP - General (Family Medicine) Leonie Man, MD as PCP - Cardiology (Cardiology)   Chief Complaint:  Hypertension   HPI: [name] is a 86 y.o. female presenting on 05/20/2022 for Hypertension   Pt presents today for evaluation of fluctuating blood pressure readings. She was scheduled to have spinal injection for chronic back pain and had to reschedule due to elevated readings. She has a log with her today and BP ranges from 110-190/50-100. She states she is taking medications as prescribed and denies any chest pain, leg swelling, headaches, weakness, or confusion.   Hypertension This is a chronic problem. The current episode started more than 1 year ago. The problem has been waxing and waning since onset. Pertinent negatives include no blurred vision, chest pain, headaches, malaise/fatigue, neck pain, orthopnea, palpitations, peripheral edema, PND, shortness of breath or sweats. Past treatments include ACE inhibitors, beta blockers and direct vasodilators. The current treatment provides moderate improvement. There are no compliance problems.     Relevant past medical, surgical, family, and social history reviewed and updated as indicated.  Allergies and medications reviewed and updated. Data reviewed: Chart in Epic.   Past Medical History:  Diagnosis Date   Anxiety    CAD S/P percutaneous coronary angioplasty 1990s, 11/28/2011    a) midRCA PCI 1999 (NIR BMS 3.0 mm x 32 mm); distal  RCA BMS Sept 2012 (Vision BMS 2.5 mm x 15 mm); 12/02: RCA ISR --> Cutting PTCA    Coronary stent restenosis due to progression of disease 11/30/2011   Dyslipidemia, goal LDL below 70 11/28/2011   Essential hypertension    On ACE inhibitor and ARB? As well as atenolol and hydralazine   Gastric ulcer 11/2018   GERD (gastroesophageal reflux disease)     History of blood transfusion    without reaction   Moderate aortic stenosis by prior echocardiogram 08/2011; 08/2013   Echo 11/09/18: EF 60-65%. Gr 2 DD.  No RWMA.  Mild-Mod AS (mean gradient 19 mmHg). Mild-Mod PAH. ->  Follow-up echo March 2021 showed mean gradient 17 mmHg (dimensionless index 0.32)-this was reported as moderate-severe AS   Myocardial infarction ([place]) 1990s   Had PTCA then PCI on RCA; 11/2011--> Dyspnea and Shoulder pain prior to adm    Non-ST elevation MI (NSTEMI) ([place]) 11/28/2011   Dyspnea and Shoulder pain prior to adm   Type 2 diabetes mellitus with complication, without long-term current use of insulin ([place])    no longer taking diabetic medication - diet controlled    Past Surgical History:  Procedure Laterality Date   ABDOMINAL HYSTERECTOMY     BIOPSY  06/03/2018   Procedure: BIOPSY;  Surgeon: Rogene Houston, MD;  [facility];  Service: Endoscopy;;  gastric    BREAST BIOPSY     CARDIAC CATHETERIZATION  2012   performed for angina   CARDIAC CATHETERIZATION     CARDIOVERSION N/A 10/24/2020   Procedure: CARDIOVERSION;  Surgeon: Jerline Pain, MD;  Location: [facility];  Service: Cardiovascular;  Laterality: N/A;   CORONARY ANGIOPLASTY  11/2006   Cutting Balloon of stent in the RCA   ESOPHAGOGASTRODUODENOSCOPY N/A 06/03/2018   Procedure: ESOPHAGOGASTRODUODENOSCOPY (EGD);  Surgeon: Rogene Houston, MD;  [facility];  Service: Endoscopy;  Laterality: N/A;  1:40   ESOPHAGOGASTRODUODENOSCOPY N/A 12/02/2018  Procedure: ESOPHAGOGASTRODUODENOSCOPY (EGD);  Surgeon: Rogene Houston, MD;  [facility];  Service: Endoscopy;  Laterality: N/A;  255   LEFT HEART CATHETERIZATION WITH CORONARY ANGIOGRAM N/A 11/27/2011   Procedure: LEFT HEART CATHETERIZATION WITH CORONARY ANGIOGRAM;  Surgeon: Leonie Man, MD;  Location: [facility];  Service: Cardiovascular;  Laterality: N/A;   PERCUTANEOUS CORONARY STENT INTERVENTION (PCI-S)  1999   Mid  RCA - NIR DMS 3.0 mm x 30 mm, dRCA Vision BMS 2.5 mm x 15 mm    TRANSTHORACIC ECHOCARDIOGRAM  10/2018   EF 60-65%. Gr 2 DD.  No RWMA.  Mild-Mod AS  (mean gradient 19 mmHg). Mild-Mod PAH.   TRANSTHORACIC ECHOCARDIOGRAM  03/01/2020   EF 70 to 75%.  No or WMA.  Mild LVH.  GRII DD with severe LA enlargement.  Mildly elevated PAP.  Mild MR.  Moderate AS (mean gradient 17 mmHg) with dimensionless index of 0.32.   TUBAL LIGATION      Social History   Socioeconomic History   Marital status: Married    Spouse name: Not on file   Number of children: Not on file   Years of education: Not on file   Highest education level: Not on file  Occupational History   Not on file  Tobacco Use   Smoking status: Former   Smokeless tobacco: Never   Tobacco comments:    Quit approximately 1993  Vaping Use   Vaping Use: Never used  Substance and Sexual Activity   Alcohol use: No   Drug use: No   Sexual activity: Not Currently    Birth control/protection: Post-menopausal  Other Topics Concern   Not on file  Social History Narrative   She is a married mother of 49, grandmother to. 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 Quit smoking in '99, denies alcohol.   She volunteers for [employer].   Social Determinants of Health   Financial Resource Strain: Not on file  Food Insecurity: Not on file  Transportation Needs: Not on file  Physical Activity: Not on file  Stress: Not on file  Social Connections: Not on file  Intimate Partner Violence: Not on file    Outpatient Encounter Medications as of 05/20/2022  Medication Sig   acetaminophen (TYLENOL) 500 MG tablet Take 1,000 mg by mouth 2 (two) times daily as needed (pain.).   apixaban (ELIQUIS) 2.5 MG TABS tablet Take 1 tablet (2.5 mg total) by mouth 2 (two) times daily.   atorvastatin (LIPITOR) 20 MG tablet Take 20 mg by mouth at bedtime.    benazepril (LOTENSIN) 40 MG tablet Take 1 tablet (40 mg total) by mouth daily.   carvedilol (COREG)  25 MG tablet Take 1 tablet (25 mg total) by mouth 2 (two) times daily.   diclofenac Sodium (VOLTAREN) 1 % GEL Apply 2-4 g topically 2 (two) times daily as needed (back/hand pain.).    furosemide (LASIX) 40 MG tablet Take 40 mg by mouth daily as needed for fluid.   hydrALAZINE (APRESOLINE) 100 MG tablet TAKE ONE TABLET BY MOUTH TWICE DAILY (Patient taking differently: Take 100 mg by mouth 2 (two) times daily.)   NITROSTAT 0.4 MG SL tablet DISSOLVE 1 TAB UNDER TOUNGE FOR CHEST PAIN. MAY REPEAT EVERY 5 MINUTES FOR 3 DOSES. IF NO RELIEF CALL 911 OR GO TO ER (Patient taking differently: Place 0.4 mg under the tongue every 5 (five) minutes as needed for chest pain.)   sertraline (ZOLOFT) 25 MG tablet TAKE  ONE (1) TABLET BY MOUTH EVERY DAY   traMADol (ULTRAM) 50 MG tablet Take 1 tablet (50 mg total) by mouth every 6 (six) hours as needed for moderate pain.   No facility-administered encounter medications on file as of 05/20/2022.    Allergies  Allergen Reactions   Valium Nausea And Vomiting    Review of Systems  Constitutional:  Negative for activity change, appetite change, chills, diaphoresis, fatigue, fever, malaise/fatigue and unexpected weight change.  Eyes:  Negative for blurred vision, photophobia and visual disturbance.  Respiratory:  Negative for cough and shortness of breath.   Cardiovascular:  Negative for chest pain, palpitations, orthopnea, leg swelling and PND.  Gastrointestinal:  Negative for abdominal pain.  Genitourinary:  Negative for decreased urine volume and difficulty urinating.  Musculoskeletal:  Positive for arthralgias, back pain and gait problem. Negative for neck pain.  Neurological:  Negative for dizziness, tremors, seizures, syncope, facial asymmetry, speech difficulty, weakness, light-headedness, numbness and headaches.  Psychiatric/Behavioral:  Negative for confusion.   All other systems reviewed and are negative.      Objective:  BP (!) 108/58   Pulse 72    Temp 98.1 F (36.7 C)   Wt 126 lb (57.2 kg)   SpO2 98%   BMI 22.32 kg/m    Wt Readings from Last 3 Encounters:  05/20/22 126 lb (57.2 kg)  05/01/22 124 lb (56.2 kg)  03/19/22 125 lb 12.8 oz (57.1 kg)    Physical Exam Vitals and nursing note reviewed.  Constitutional:      General: She is not in acute distress.    Appearance: Normal appearance. She is normal weight. She is not ill-appearing, toxic-appearing or diaphoretic.  HENT:     Head: Normocephalic and atraumatic.     Mouth/Throat:     Mouth: Mucous membranes are moist.  Eyes:     Pupils: Pupils are equal, round, and reactive to light.  Cardiovascular:     Rate and Rhythm: Normal rate. Rhythm irregularly irregular.     Heart sounds: Murmur heard.  Systolic murmur is present.  Pulmonary:     Effort: Pulmonary effort is normal.     Breath sounds: Normal breath sounds.  Musculoskeletal:     Right lower leg: No edema.     Left lower leg: No edema.  Skin:    General: Skin is warm and dry.     Capillary Refill: Capillary refill takes less than 2 seconds.  Neurological:     General: No focal deficit present.     Mental Status: She is alert and oriented to person, place, and time.     Gait: Gait abnormal (antalgic, using rolling walker).  Psychiatric:        Mood and Affect: Mood normal.        Behavior: Behavior normal.        Thought Content: Thought content normal.        Judgment: Judgment normal.    Results for orders placed or performed in visit on 05/01/22  [facility]  Result Value Ref Range   Glucose 115 (H) 70 - 99 mg/dL   BUN 16 8 - 27 mg/dL   Creatinine, Ser 0.64 0.57 - 1.00 mg/dL   eGFR 87 >59 mL/min/1.73   BUN/Creatinine Ratio 25 12 - 28   Sodium 141 134 - 144 mmol/L   Potassium 4.2 3.5 - 5.2 mmol/L   Chloride 101 96 - 106 mmol/L   CO2 26 20 - 29 mmol/L   Calcium 9.7 8.7 -  10.3 mg/dL       Pertinent labs & imaging results that were available during my care of the patient were reviewed by me and  considered in my medical decision making.  Assessment & Plan:  Daleen was seen today for hypertension.  Diagnoses and all orders for this visit:  Hypertension associated with diabetes ([place]) Labile readings. Home machine was not brought to office today. Pt aware to bring in to have checked. Readings normal in office today. Will continue current regimen. DASH diet encouraged.     Continue all other maintenance medications.  Follow up plan: Return in about 2 months (around 07/20/2022), or if symptoms worsen or fail to improve, for DM, HTN, Lipids.   Continue healthy lifestyle choices, including diet (rich in fruits, vegetables, and lean proteins, and low in salt and simple carbohydrates) and exercise (at least 30 minutes of moderate physical activity daily).  Educational handout given for DASH diet  The above assessment and management plan was discussed with the patient. The patient verbalized understanding of and has agreed to the management plan. Patient is aware to call the clinic if they develop any new symptoms or if symptoms persist or worsen. Patient is aware when to return to the clinic for a follow-up visit. Patient educated on when it is appropriate to go to the emergency department.   Monia Pouch, FNP-C [facility] [phone]

## 2022-05-20 NOTE — Patient Instructions (Signed)

## 2022-05-23 ENCOUNTER — Other Ambulatory Visit: Payer: Self-pay | Admitting: Family Medicine

## 2022-05-23 ENCOUNTER — Telehealth: Payer: Self-pay

## 2022-05-23 DIAGNOSIS — G8929 Other chronic pain: Secondary | ICD-10-CM

## 2022-05-23 NOTE — Telephone Encounter (Signed)
Patient called and stated she was told by Dr. Wynn Banker to call when she needs pain medication. She is working with her family doctor to get her blood pressure under control so she can get the injections. She stated she is taking Tramadol.  Informed patient that Dr. Wynn Banker is out of the office and will return next week. She stated she has about 5-6 tablets of tramadol

## 2022-05-26 MED ORDER — TRAMADOL HCL 50 MG PO TABS: 50.0000 mg | ORAL_TABLET | Freq: Four times a day (QID) | ORAL | 1 refills | Status: DC | PRN

## 2022-05-26 NOTE — Addendum Note (Signed)
Addended by: Charlett Blake on: 05/26/2022 11:10 AM   Modules accepted: Orders

## 2022-05-27 ENCOUNTER — Ambulatory Visit (INDEPENDENT_AMBULATORY_CARE_PROVIDER_SITE_OTHER): Payer: Medicare Other

## 2022-05-27 VITALS — BP 163/71 | HR 86

## 2022-05-27 DIAGNOSIS — I10 Essential (primary) hypertension: Secondary | ICD-10-CM

## 2022-05-27 NOTE — Progress Notes (Signed)
Patient came in today for BP check - she brought her machine that she uses at home.  BP today was 159/71 heart rate 84 - let patient sit for 10 mins and rechecked and 2nd reading was 163/71 heart rate 86. Patient denies any symptoms - states that she feels well. Patient brought in her readings from home and these have been given to PCP for review.

## 2022-06-06 ENCOUNTER — Other Ambulatory Visit: Payer: Self-pay | Admitting: Physical Medicine & Rehabilitation

## 2022-06-06 DIAGNOSIS — G8929 Other chronic pain: Secondary | ICD-10-CM

## 2022-06-11 ENCOUNTER — Ambulatory Visit: Payer: Medicare Other

## 2022-06-18 ENCOUNTER — Ambulatory Visit (INDEPENDENT_AMBULATORY_CARE_PROVIDER_SITE_OTHER): Payer: Medicare Other

## 2022-06-18 VITALS — Ht 63.0 in | Wt 126.0 lb

## 2022-06-18 DIAGNOSIS — Z0000 Encounter for general adult medical examination without abnormal findings: Secondary | ICD-10-CM | POA: Diagnosis not present

## 2022-06-18 NOTE — Patient Instructions (Signed)
[name] , Thank you for taking time to come for your Medicare Wellness Visit. I appreciate your ongoing commitment to your health goals. Please review the following plan we discussed and let me know if I can assist you in the future.   Screening recommendations/referrals: Colonoscopy: No longer required due to age. Mammogram: No longer required due to age. Bone Density: No longer required.  Recommended yearly ophthalmology/optometry visit for glaucoma screening and checkup Recommended yearly dental visit for hygiene and checkup  Vaccinations: Influenza vaccine: Due Fall 2023. Pneumococcal vaccine: Done 03/09/2014 and 09/27/2008. Tdap vaccine: Done 10/07/2011 Repeat in 10 years  Shingles vaccine: Done 12/11/2020, 07/30/2020 and 11/18/2011 Covid-19:Done 02/15/2020, 03/14/2020, 11/27/2020, 06/13/2021 and 10/12/2021.  Advanced directives: Please bring a copy of your health care power of attorney and living will to the office to be added to your chart at your convenience.   Conditions/risks identified: Aim for 6-8 glasses of water, and 5 servings of fruits and vegetables each day.   Next appointment: Follow up in one year for your annual wellness visit 2024.   Preventive Care 33 Years and Older, Female Preventive care refers to lifestyle choices and visits with your health care provider that can promote health and wellness. What does preventive care include? A yearly physical exam. This is also called an annual well check. Dental exams once or twice a year. Routine eye exams. Ask your health care provider how often you should have your eyes checked. Personal lifestyle choices, including: Daily care of your teeth and gums. Regular physical activity. Eating a healthy diet. Avoiding tobacco and drug use. Limiting alcohol use. Practicing safe sex. Taking low-dose aspirin every day. Taking vitamin and mineral supplements as recommended by your health care provider. What happens during an  annual well check? The services and screenings done by your health care provider during your annual well check will depend on your age, overall health, lifestyle risk factors, and family history of disease. Counseling  Your health care provider may ask you questions about your: Alcohol use. Tobacco use. Drug use. Emotional well-being. Home and relationship well-being. Sexual activity. Eating habits. History of falls. Memory and ability to understand (cognition). Work and work Astronomer. Reproductive health. Screening  You may have the following tests or measurements: Height, weight, and BMI. Blood pressure. Lipid and cholesterol levels. These may be checked every 5 years, or more frequently if you are over 4 years old. Skin check. Lung cancer screening. You may have this screening every year starting at age 47 if you have a 30-pack-year history of smoking and currently smoke or have quit within the past 15 years. Fecal occult blood test (FOBT) of the stool. You may have this test every year starting at age 67. Flexible sigmoidoscopy or colonoscopy. You may have a sigmoidoscopy every 5 years or a colonoscopy every 10 years starting at age 47. Hepatitis C blood test. Hepatitis B blood test. Sexually transmitted disease (STD) testing. Diabetes screening. This is done by checking your blood sugar (glucose) after you have not eaten for a while (fasting). You may have this done every 1-3 years. Bone density scan. This is done to screen for osteoporosis. You may have this done starting at age 81. Mammogram. This may be done every 1-2 years. Talk to your health care provider about how often you should have regular mammograms. Talk with your health care provider about your test results, treatment options, and if necessary, the need for more tests. Vaccines  Your health care provider may  recommend certain vaccines, such as: Influenza vaccine. This is recommended every year. Tetanus,  diphtheria, and acellular pertussis (Tdap, Td) vaccine. You may need a Td booster every 10 years. Zoster vaccine. You may need this after age 19. Pneumococcal 13-valent conjugate (PCV13) vaccine. One dose is recommended after age 14. Pneumococcal polysaccharide (PPSV23) vaccine. One dose is recommended after age 21. Talk to your health care provider about which screenings and vaccines you need and how often you need them. This information is not intended to replace advice given to you by your health care provider. Make sure you discuss any questions you have with your health care provider. Document Released: 01/04/2016 Document Revised: 08/27/2016 Document Reviewed: 10/09/2015 Elsevier Interactive Patient Education  2017 [place] Prevention in the Home Falls can cause injuries. They can happen to people of all ages. There are many things you can do to make your home safe and to help prevent falls. What can I do on the outside of my home? Regularly fix the edges of walkways and driveways and fix any cracks. Remove anything that might make you trip as you walk through a door, such as a raised step or threshold. Trim any bushes or trees on the path to your home. Use bright outdoor lighting. Clear any walking paths of anything that might make someone trip, such as rocks or tools. Regularly check to see if handrails are loose or broken. Make sure that both sides of any steps have handrails. Any raised decks and porches should have guardrails on the edges. Have any leaves, snow, or ice cleared regularly. Use sand or salt on walking paths during winter. Clean up any spills in your garage right away. This includes oil or grease spills. What can I do in the bathroom? Use night lights. Install grab bars by the toilet and in the tub and shower. Do not use towel bars as grab bars. Use non-skid mats or decals in the tub or shower. If you need to sit down in the shower, use a plastic,  non-slip stool. Keep the floor dry. Clean up any water that spills on the floor as soon as it happens. Remove soap buildup in the tub or shower regularly. Attach bath mats securely with double-sided non-slip rug tape. Do not have throw rugs and other things on the floor that can make you trip. What can I do in the bedroom? Use night lights. Make sure that you have a light by your bed that is easy to reach. Do not use any sheets or blankets that are too big for your bed. They should not hang down onto the floor. Have a firm chair that has side arms. You can use this for support while you get dressed. Do not have throw rugs and other things on the floor that can make you trip. What can I do in the kitchen? Clean up any spills right away. Avoid walking on wet floors. Keep items that you use a lot in easy-to-reach places. If you need to reach something above you, use a strong step stool that has a grab bar. Keep electrical cords out of the way. Do not use floor polish or wax that makes floors slippery. If you must use wax, use non-skid floor wax. Do not have throw rugs and other things on the floor that can make you trip. What can I do with my stairs? Do not leave any items on the stairs. Make sure that there are handrails on both sides of  the stairs and use them. Fix handrails that are broken or loose. Make sure that handrails are as long as the stairways. Check any carpeting to make sure that it is firmly attached to the stairs. Fix any carpet that is loose or worn. Avoid having throw rugs at the top or bottom of the stairs. If you do have throw rugs, attach them to the floor with carpet tape. Make sure that you have a light switch at the top of the stairs and the bottom of the stairs. If you do not have them, ask someone to add them for you. What else can I do to help prevent falls? Wear shoes that: Do not have high heels. Have rubber bottoms. Are comfortable and fit you well. Are closed  at the toe. Do not wear sandals. If you use a stepladder: Make sure that it is fully opened. Do not climb a closed stepladder. Make sure that both sides of the stepladder are locked into place. Ask someone to hold it for you, if possible. Clearly mark and make sure that you can see: Any grab bars or handrails. First and last steps. Where the edge of each step is. Use tools that help you move around (mobility aids) if they are needed. These include: Canes. Walkers. Scooters. Crutches. Turn on the lights when you go into a dark area. Replace any light bulbs as soon as they burn out. Set up your furniture so you have a clear path. Avoid moving your furniture around. If any of your floors are uneven, fix them. If there are any pets around you, be aware of where they are. Review your medicines with your doctor. Some medicines can make you feel dizzy. This can increase your chance of falling. Ask your doctor what other things that you can do to help prevent falls. This information is not intended to replace advice given to you by your health care provider. Make sure you discuss any questions you have with your health care provider. Document Released: 10/04/2009 Document Revised: 05/15/2016 Document Reviewed: 01/12/2015 Elsevier Interactive Patient Education  2017 [employer].

## 2022-06-18 NOTE — Progress Notes (Signed)
Subjective:   [name] is a 86 y.o. female who presents for an Initial Medicare Annual Wellness Visit. Virtual Visit via Telephone Note  I connected with  [name] on 06/18/22 at 12:00 PM EDT by telephone and verified that I am speaking with the correct person using two identifiers.  Location: Patient: HOME Provider: [facility] Persons participating in the virtual visit: patient/Nurse Health Advisor   I discussed the limitations, risks, security and privacy concerns of performing an evaluation and management service by telephone and the availability of in person appointments. The patient expressed understanding and agreed to proceed.  Interactive audio and video telecommunications were attempted between this nurse and patient, however failed, due to patient having technical difficulties OR patient did not have access to video capability.  We continued and completed visit with audio only.  Some vital signs may be absent or patient reported.   Darral Dash, LPN  Review of Systems     Cardiac Risk Factors include: advanced age (>28men, >50 women);hypertension;diabetes mellitus;dyslipidemia;sedentary lifestyle     Objective:    Today's Vitals   06/18/22 1154  Weight: 126 lb (57.2 kg)  Height: 5\' 3"  (1.6 m)   Body mass index is 22.32 kg/m.     06/18/2022   12:03 PM 03/18/2022   12:42 PM 10/24/2020   11:42 AM 09/01/2020    2:21 PM 02/29/2020   12:20 PM 12/02/2018    2:01 PM 06/03/2018    6:36 AM  Advanced Directives  Does Patient Have a Medical Advance Directive? Yes No No No Yes Yes No  Type of 06/05/2018 of [place];Living will    Living will Living will   Copy of Healthcare Power of Attorney in Chart? No - copy requested        Would patient like information on creating a medical advance directive?   No - Patient declined No - Patient declined   No - Patient declined    Current Medications (verified) Outpatient Encounter  Medications as of 06/18/2022  Medication Sig   acetaminophen (TYLENOL) 500 MG tablet Take 1,000 mg by mouth 2 (two) times daily as needed (pain.).   apixaban (ELIQUIS) 2.5 MG TABS tablet Take 1 tablet (2.5 mg total) by mouth 2 (two) times daily.   atorvastatin (LIPITOR) 20 MG tablet Take 20 mg by mouth at bedtime.    benazepril (LOTENSIN) 40 MG tablet Take 1 tablet (40 mg total) by mouth daily.   carvedilol (COREG) 25 MG tablet Take 1 tablet (25 mg total) by mouth 2 (two) times daily.   diclofenac Sodium (VOLTAREN) 1 % GEL Apply 2-4 g topically 2 (two) times daily as needed (back/hand pain.).    furosemide (LASIX) 40 MG tablet Take 40 mg by mouth daily as needed for fluid.   hydrALAZINE (APRESOLINE) 100 MG tablet TAKE ONE TABLET BY MOUTH TWICE DAILY (Patient taking differently: Take 100 mg by mouth 2 (two) times daily.)   NITROSTAT 0.4 MG SL tablet DISSOLVE 1 TAB UNDER TOUNGE FOR CHEST PAIN. MAY REPEAT EVERY 5 MINUTES FOR 3 DOSES. IF NO RELIEF CALL 911 OR GO TO ER (Patient taking differently: Place 0.4 mg under the tongue every 5 (five) minutes as needed for chest pain.)   sertraline (ZOLOFT) 25 MG tablet TAKE ONE (1) TABLET BY MOUTH EVERY DAY   traMADol (ULTRAM) 50 MG tablet Take 1 tablet (50 mg total) by mouth every 6 (six) hours as needed for moderate pain.   No facility-administered  encounter medications on file as of 06/18/2022.    Allergies (verified) Valium   History: Past Medical History:  Diagnosis Date   Anxiety    CAD S/P percutaneous coronary angioplasty 1990s, 11/28/2011    a) midRCA PCI 1999 (NIR BMS 3.0 mm x 32 mm); distal  RCA BMS Sept 2012 (Vision BMS 2.5 mm x 15 mm); 12/02: RCA ISR --> Cutting PTCA    Coronary stent restenosis due to progression of disease 11/30/2011   Dyslipidemia, goal LDL below 70 11/28/2011   Essential hypertension    On ACE inhibitor and ARB? As well as atenolol and hydralazine   Gastric ulcer 11/2018   GERD (gastroesophageal reflux disease)     History of blood transfusion    without reaction   Moderate aortic stenosis by prior echocardiogram 08/2011; 08/2013   Echo 11/09/18: EF 60-65%. Gr 2 DD.  No RWMA.  Mild-Mod AS (mean gradient 19 mmHg). Mild-Mod PAH. ->  Follow-up echo March 2021 showed mean gradient 17 mmHg (dimensionless index 0.32)-this was reported as moderate-severe AS   Myocardial infarction ([place]) 1990s   Had PTCA then PCI on RCA; 11/2011--> Dyspnea and Shoulder pain prior to adm    Non-ST elevation MI (NSTEMI) ([place]) 11/28/2011   Dyspnea and Shoulder pain prior to adm   Type 2 diabetes mellitus with complication, without long-term current use of insulin ([place])    no longer taking diabetic medication - diet controlled   Past Surgical History:  Procedure Laterality Date   ABDOMINAL HYSTERECTOMY     BIOPSY  06/03/2018   Procedure: BIOPSY;  Surgeon: Rogene Houston, MD;  [facility];  Service: Endoscopy;;  gastric    BREAST BIOPSY     CARDIAC CATHETERIZATION  2012   performed for angina   CARDIAC CATHETERIZATION     CARDIOVERSION N/A 10/24/2020   Procedure: CARDIOVERSION;  Surgeon: Jerline Pain, MD;  Location: [place];  Service: Cardiovascular;  Laterality: N/A;   CORONARY ANGIOPLASTY  11/2006   Cutting Balloon of stent in the RCA   ESOPHAGOGASTRODUODENOSCOPY N/A 06/03/2018   Procedure: ESOPHAGOGASTRODUODENOSCOPY (EGD);  Surgeon: Rogene Houston, MD;  [facility];  Service: Endoscopy;  Laterality: N/A;  1:40   ESOPHAGOGASTRODUODENOSCOPY N/A 12/02/2018   Procedure: ESOPHAGOGASTRODUODENOSCOPY (EGD);  Surgeon: Rogene Houston, MD;  [facility];  Service: Endoscopy;  Laterality: N/A;  255   LEFT HEART CATHETERIZATION WITH CORONARY ANGIOGRAM N/A 11/27/2011   Procedure: LEFT HEART CATHETERIZATION WITH CORONARY ANGIOGRAM;  Surgeon: Leonie Man, MD;  Location: [facility] CATH [facility];  Service: Cardiovascular;  Laterality: N/A;   PERCUTANEOUS CORONARY STENT INTERVENTION (PCI-S)  1999   Mid RCA  - NIR DMS 3.0 mm x 30 mm, dRCA Vision BMS 2.5 mm x 15 mm    TRANSTHORACIC ECHOCARDIOGRAM  10/2018   EF 60-65%. Gr 2 DD.  No RWMA.  Mild-Mod AS  (mean gradient 19 mmHg). Mild-Mod PAH.   TRANSTHORACIC ECHOCARDIOGRAM  03/01/2020   EF 70 to 75%.  No or WMA.  Mild LVH.  GRII DD with severe LA enlargement.  Mildly elevated PAP.  Mild MR.  Moderate AS (mean gradient 17 mmHg) with dimensionless index of 0.32.   TUBAL LIGATION     Family History  Problem Relation Age of Onset   Cancer Mother    Heart disease Father    Heart disease Maternal Grandmother    Heart disease Maternal Grandfather    Heart disease Brother    Nephrolithiasis Brother  accidental death   Heart Problems Brother        CABG   Social History   Socioeconomic History   Marital status: Married    Spouse name: Channing Mutters   Number of children: 3   Years of education: Not on file   Highest education level: Not on file  Occupational History   Not on file  Tobacco Use   Smoking status: Former   Smokeless tobacco: Never   Tobacco comments:    Quit approximately 1993  Vaping Use   Vaping Use: Never used  Substance and Sexual Activity   Alcohol use: No   Drug use: No   Sexual activity: Not Currently    Birth control/protection: Post-menopausal  Other Topics Concern   Not on file  Social History Narrative   She is a married mother of 3, 2 daughters, 1 son who all live out of state but talks to weekly.   Step-son and his family live next door. Sees often.    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    Quit smoking in '99, denies alcohol.   She volunteers for [employer].   Social Determinants of Health   Financial Resource Strain: Low Risk  (06/18/2022)   Overall Financial Resource Strain (CARDIA)    Difficulty of Paying Living Expenses: Not hard at all  Food Insecurity: No Food Insecurity (06/18/2022)   Hunger Vital Sign    Worried About Running Out of Food in the Last Year: Never true    Ran Out of  Food in the Last Year: Never true  Transportation Needs: No Transportation Needs (06/18/2022)   PRAPARE - Administrator, Civil Service (Medical): No    Lack of Transportation (Non-Medical): No  Physical Activity: Inactive (06/18/2022)   Exercise Vital Sign    Days of Exercise per Week: 0 days    Minutes of Exercise per Session: 0 min  Stress: No Stress Concern Present (06/18/2022)   [employer] of Occupational Health - Occupational Stress Questionnaire    Feeling of Stress : Only a little  Social Connections: Moderately Integrated (06/18/2022)   Social Connection and Isolation Panel [NHANES]    Frequency of Communication with Friends and Family: More than three times a week    Frequency of Social Gatherings with Friends and Family: More than three times a week    Attends Religious Services: 1 to 4 times per year    Active Member of [employer] or Organizations: No    Attends Engineer, structural: Never    Marital Status: Married    Tobacco Counseling Counseling given: Not Answered Tobacco comments: Quit approximately 1993   Clinical Intake:  Pre-visit preparation completed: Yes  Pain : No/denies pain     BMI - recorded: 22.32 Nutritional Status: BMI of 19-24  Normal Nutritional Risks: None Diabetes: Yes  How often do you need to have someone help you when you read instructions, pamphlets, or other written materials from your doctor or pharmacy?: 1 - Never  Diabetic? Nutrition Risk Assessment:  Has the patient had any N/V/D within the last 2 months?  No  Does the patient have any non-healing wounds?  No  Has the patient had any unintentional weight loss or weight gain?  No   Diabetes:  Is the patient diabetic?  No  If diabetic, was a CBG obtained today?  No  Did the patient bring in their glucometer from home?  No  How often do you  monitor your CBG's? Does not check per pt. Diet controlled..   Financial Strains and Diabetes Management:  Are you  having any financial strains with the device, your supplies or your medication? No .  Does the patient want to be seen by Chronic Care Management for management of their diabetes?  No  Would the patient like to be referred to a Nutritionist or for Diabetic Management?  No   Diabetic Exams:  Diabetic Eye Exam: Completed 2021. Overdue for diabetic eye exam. Pt has been advised about the importance in completing this exam. A referral has been placed today. Message sent to referral coordinator for scheduling purposes. Advised pt to expect a call from office referred to regarding appt.  Diabetic Foot Exam: Completed DUE. Pt has been advised about the importance in completing this exam.  Interpreter Needed?: No  Information entered by :: mj Teyanna Thielman, lpn   Activities of Daily Living    06/18/2022   12:05 PM  In your present state of health, do you have any difficulty performing the following activities:  Hearing? 0  Vision? 0  Difficulty concentrating or making decisions? 0  Walking or climbing stairs? 1  Dressing or bathing? 0  Doing errands, shopping? 0  Preparing Food and eating ? N  Using the Toilet? N  In the past six months, have you accidently leaked urine? N  Do you have problems with loss of bowel control? N  Managing your Medications? N  Managing your Finances? N  Housekeeping or managing your Housekeeping? N    Patient Care Team: Baruch Gouty, FNP as PCP - General (Family Medicine) Leonie Man, MD as PCP - Cardiology (Cardiology)  Indicate any recent Medical Services you may have received from other than [facility] providers in the past year (date may be approximate).     Assessment:   This is a routine wellness examination for [place].  Hearing/Vision screen Hearing Screening - Comments:: Some hearing issues.  Vision Screening - Comments:: Glasses. 2021.   Dietary issues and exercise activities discussed: Current Exercise Habits: The patient does not participate  in regular exercise at present, Exercise limited by: cardiac condition(s);orthopedic condition(s)   Goals Addressed             This Visit's Progress    Exercise 3x per week (30 min per time)       Continue to move as tolerated.       Depression Screen    06/18/2022   11:58 AM 05/20/2022   10:15 AM 05/01/2022    2:11 PM 03/19/2022    2:12 PM 03/18/2022   12:42 PM 01/16/2022    9:24 AM  PHQ 2/9 Scores  PHQ - 2 Score 1 2 0 0 0 0  PHQ- 9 Score 2 5 0 3 0 2    Fall Risk    06/18/2022   12:04 PM 05/20/2022   10:16 AM 05/01/2022    2:12 PM 03/19/2022    2:12 PM 03/18/2022   12:42 PM  Fall Risk   Falls in the past year? 0 1 1 1 1   Number falls in past yr: 0 0 0 0 0  Injury with Fall? 0 0 0 0 0  Risk for fall due to : Impaired balance/gait;Impaired mobility Impaired mobility Impaired mobility    Follow up Falls prevention discussed Falls prevention discussed Falls prevention discussed Falls prevention discussed     FALL RISK PREVENTION PERTAINING TO THE HOME:  Any stairs in or around the  home? Yes  If so, are there any without handrails? No  Home free of loose throw rugs in walkways, pet beds, electrical cords, etc? Yes  Adequate lighting in your home to reduce risk of falls? Yes   ASSISTIVE DEVICES UTILIZED TO PREVENT FALLS:  Life alert? No  Use of a cane, walker or w/c? Yes  Grab bars in the bathroom? Yes  Shower chair or bench in shower? Yes  Elevated toilet seat or a handicapped toilet? Yes   TIMED UP AND GO:  Was the test performed? No .  Phone visit.  Cognitive Function:        06/18/2022   12:06 PM  6CIT Screen  What Year? 0 points  What month? 0 points  What time? 0 points  Count back from 20 0 points  Months in reverse 2 points  Repeat phrase 0 points  Total Score 2 points    Immunizations Immunization History  Administered Date(s) Administered   Influenza-Unspecified 12/11/2020   Moderna Covid-19 Vaccine Bivalent Booster 89yrs & up 11/27/2020,  06/13/2021, 10/12/2021   Moderna Sars-Covid-2 Vaccination 02/15/2020, 03/14/2020   Pneumococcal Conjugate,unspecified 09/27/2008   Pneumococcal Conjugate-13 03/09/2014   Pneumococcal-Unspecified 09/24/2008   Td 10/07/2011   Tdap 10/07/2011   Zoster Recombinat (Shingrix) 07/30/2020, 12/11/2020   Zoster, Live 11/18/2011    TDAP status: Up to date  Flu Vaccine status: Up to date  Pneumococcal vaccine status: Up to date  Covid-19 vaccine status: Completed vaccines  Qualifies for Shingles Vaccine? Yes   Zostavax completed Yes   Shingrix Completed?: Yes  Screening Tests Health Maintenance  Topic Date Due   FOOT EXAM  08/21/2022 (Originally 01/17/1946)   OPHTHALMOLOGY EXAM  12/19/2022 (Originally 01/17/1946)   DEXA SCAN  06/19/2023 (Originally 01/16/2003)   TETANUS/TDAP  06/19/2023 (Originally 10/06/2021)   Pneumonia Vaccine 64+ Years old (2 - PPSV23 if available, else PCV20) 07/22/2023 (Originally 03/10/2015)   INFLUENZA VACCINE  07/22/2022   HEMOGLOBIN A1C  09/19/2022   COVID-19 Vaccine  Completed   Zoster Vaccines- Shingrix  Completed   HPV VACCINES  Aged Out    Health Maintenance  There are no preventive care reminders to display for this patient.   Colorectal cancer screening: No longer required.   Mammogram status: No longer required due to AGE.  Bone Density status: Ordered DEFERRED. Pt provided with contact info and advised to call to schedule appt.  Lung Cancer Screening: (Low Dose CT Chest recommended if Age 51-80 years, 30 pack-year currently smoking OR have quit w/in 15years.) does not qualify.   Additional Screening:  Hepatitis C Screening: does not qualify;   Vision Screening: Recommended annual ophthalmology exams for early detection of glaucoma and other disorders of the eye. Is the patient up to date with their annual eye exam?  No  Who is the provider or what is the name of the office in which the patient attends annual eye exams? No current  Optometrist. If pt is not established with a provider, would they like to be referred to a provider to establish care? No .   Dental Screening: Recommended annual dental exams for proper oral hygiene  Community Resource Referral / Chronic Care Management: CRR required this visit?  No   CCM required this visit?  No      Plan:     I have personally reviewed and noted the following in the patient's chart:   Medical and social history Use of alcohol, tobacco or illicit drugs  Current medications and supplements including  opioid prescriptions. Patient is not currently taking opioid prescriptions. Functional ability and status Nutritional status Physical activity Advanced directives List of other physicians Hospitalizations, surgeries, and ER visits in previous 12 months Vitals Screenings to include cognitive, depression, and falls Referrals and appointments  In addition, I have reviewed and discussed with patient certain preventive protocols, quality metrics, and best practice recommendations. A written personalized care plan for preventive services as well as general preventive health recommendations were provided to patient.     Chriss Driver, LPN   [id number]   Nurse Notes: Discussed Prevnar 91, Tdap and how to obtain. Declined DEXA at this time. Pt states she will call to schedule eye appointment in the next month.

## 2022-06-19 ENCOUNTER — Other Ambulatory Visit: Payer: Self-pay | Admitting: Physical Medicine & Rehabilitation

## 2022-06-19 DIAGNOSIS — G8929 Other chronic pain: Secondary | ICD-10-CM

## 2022-06-20 ENCOUNTER — Ambulatory Visit (HOSPITAL_COMMUNITY): Payer: Medicare Other | Attending: Cardiology

## 2022-06-20 DIAGNOSIS — I350 Nonrheumatic aortic (valve) stenosis: Secondary | ICD-10-CM | POA: Diagnosis not present

## 2022-06-20 LAB — ECHOCARDIOGRAM LIMITED
AR max vel: 0.74 cm2
AV Area VTI: 0.72 cm2
AV Area mean vel: 0.7 cm2
AV Mean grad: 27 mmHg
AV Peak grad: 46 mmHg
Ao pk vel: 3.39 m/s
S' Lateral: 2.3 cm

## 2022-06-25 ENCOUNTER — Telehealth: Payer: Self-pay

## 2022-06-25 ENCOUNTER — Other Ambulatory Visit: Payer: Self-pay | Admitting: Family Medicine

## 2022-06-25 NOTE — Telephone Encounter (Addendum)
Called patient regarding results and to advise of referral to structural heart . Patient stated will not be setting up any appointments at this time. Due husband being in hospice.----- Message from Cannon Kettle, PA-C sent at 06/24/2022  7:31 AM EDT ----- Patient with paradoxical LFLG AS.  Cline Crock PA-C reached out to her in December 2022 to make initial structural appointment.  She had a lot personally going on at that time -therefore the plan was to repeat echo in 6 months (this result) and follow-up with structural.    Compared to study dated 11/27/2021, the DVI has decreased from  0.24>0.23, AVA has decreased from 0.81cm2>>0.72cm2, and no change in mean  AVG.   Kathryn/structural team --can you reach back out to Ms. Mattia to make a structural appointment for evaluation of aortic stenosis?

## 2022-06-26 NOTE — Progress Notes (Signed)
Cannon Kettle, PA-C  Iona Coach, RN Thank you!  Britt Boozer        Previous Messages    ----- Message -----  From: Iona Coach, RN  Sent: 06/25/2022   3:05 PM EDT  To: Iona Coach, RN; Janetta Hora, PA-C; *   Hey,   I contacted the pt and her husband is now in Hospice and she does not want to make an appointment.  I offered to call her in 1 month and discuss making an appointment at that time and she was in agreement.   Thanks,  Harleen Fineberg  ----- Message -----  From: Allena Katz  Sent: 06/25/2022   8:38 AM EDT  To: Iona Coach, RN; Cannon Kettle, PA-C   We will take care of it  ----- Message -----  From: Cannon Kettle, PA-C  Sent: 06/24/2022   7:31 AM EDT  To: Janetta Hora, PA-C; *   Patient with paradoxical LFLG AS.  Cline Crock PA-C reached out to her in December 2022 to make initial structural appointment.  She had a lot personally going on at that time -therefore the plan was to repeat echo in 6 months (this result) and follow-up with structural.     Compared to study dated 11/27/2021, the DVI has decreased from  0.24>0.23, AVA has decreased from 0.81cm2>>0.72cm2, and no change in mean  AVG.   Kathryn/structural team --can you reach back out to Ms. Deisher to make a structural appointment for evaluation of aortic stenosis?

## 2022-07-04 ENCOUNTER — Other Ambulatory Visit: Payer: Self-pay | Admitting: Physical Medicine & Rehabilitation

## 2022-07-04 DIAGNOSIS — G8929 Other chronic pain: Secondary | ICD-10-CM

## 2022-07-16 ENCOUNTER — Other Ambulatory Visit: Payer: Self-pay | Admitting: Physical Medicine & Rehabilitation

## 2022-07-16 ENCOUNTER — Other Ambulatory Visit: Payer: Self-pay

## 2022-07-16 DIAGNOSIS — G8929 Other chronic pain: Secondary | ICD-10-CM

## 2022-07-16 NOTE — Telephone Encounter (Signed)
Filled  Written  ID  Drug  QTY  Days  Prescriber  RX #  Dispenser  Refill  Daily Dose*  Pymt Type  PMP  07/04/2022 06/19/2022 1  Tramadol Hcl 50 Mg Tablet 30.00 8 An Kir [id number] The (5093) 1/1 37.50 MMET Medicare [place]   Patient spouse is under Hospice Care. She will be back  to see you soon as possible.

## 2022-07-18 MED ORDER — TRAMADOL HCL 50 MG PO TABS: 50.0000 mg | ORAL_TABLET | Freq: Four times a day (QID) | ORAL | 1 refills | Status: DC | PRN

## 2022-07-23 ENCOUNTER — Telehealth: Payer: Self-pay | Admitting: Family Medicine

## 2022-07-23 NOTE — Telephone Encounter (Signed)
Pt has swelling in feet and ankles that started a couple of days ago. Pt has some furosemide (LASIX) 40 MG tablet at home that DR Hardening that prescribed back in 2019. Pt wants to ask [facility] if it is ok to take until her apt next week with [facility].   Please call back

## 2022-07-24 NOTE — Telephone Encounter (Signed)
Pt declined having any of those symptoms. Says she already has a lasix Rx that was given to her by her cardiologist so she will take that for 5 days to see if it helps and will call PCP if it doesn't.

## 2022-07-28 ENCOUNTER — Telehealth: Payer: Self-pay

## 2022-07-28 NOTE — Telephone Encounter (Signed)
I contacted the pt in regards to Structural Heart referral and the pt's husband just passed away on 2023-04-09 and she is currently in the process of planning his funeral.  She asked me to call her again in a month or she would call the office prior if she would like to arrange appointment. I offered my condolences and told her to contact the office with any other questions or concerns.

## 2022-08-01 ENCOUNTER — Ambulatory Visit: Payer: Medicare Other | Admitting: Family Medicine

## 2022-08-05 ENCOUNTER — Ambulatory Visit: Payer: Medicare Other | Admitting: Family Medicine

## 2022-08-06 ENCOUNTER — Encounter: Payer: Self-pay | Admitting: Family Medicine

## 2022-08-06 ENCOUNTER — Ambulatory Visit (INDEPENDENT_AMBULATORY_CARE_PROVIDER_SITE_OTHER): Payer: Medicare Other | Admitting: Family Medicine

## 2022-08-06 VITALS — BP 151/65 | HR 82 | Temp 98.7°F | Ht 63.0 in | Wt 130.0 lb

## 2022-08-06 DIAGNOSIS — I110 Hypertensive heart disease with heart failure: Secondary | ICD-10-CM

## 2022-08-06 DIAGNOSIS — I152 Hypertension secondary to endocrine disorders: Secondary | ICD-10-CM

## 2022-08-06 DIAGNOSIS — I5032 Chronic diastolic (congestive) heart failure: Secondary | ICD-10-CM | POA: Diagnosis not present

## 2022-08-06 DIAGNOSIS — E1159 Type 2 diabetes mellitus with other circulatory complications: Secondary | ICD-10-CM | POA: Diagnosis not present

## 2022-08-06 DIAGNOSIS — I350 Nonrheumatic aortic (valve) stenosis: Secondary | ICD-10-CM | POA: Diagnosis not present

## 2022-08-06 DIAGNOSIS — E785 Hyperlipidemia, unspecified: Secondary | ICD-10-CM | POA: Diagnosis not present

## 2022-08-06 DIAGNOSIS — F5101 Primary insomnia: Secondary | ICD-10-CM

## 2022-08-06 DIAGNOSIS — E1169 Type 2 diabetes mellitus with other specified complication: Secondary | ICD-10-CM

## 2022-08-06 DIAGNOSIS — E119 Type 2 diabetes mellitus without complications: Secondary | ICD-10-CM

## 2022-08-06 LAB — BAYER DCA HB A1C WAIVED: HB A1C (BAYER DCA - WAIVED): 5.4 % (ref 4.8–5.6)

## 2022-08-06 MED ORDER — FUROSEMIDE 40 MG PO TABS: 40.0000 mg | ORAL_TABLET | Freq: Every day | ORAL | 3 refills | Status: DC | PRN

## 2022-08-06 NOTE — Progress Notes (Signed)
Subjective:  Patient ID: [name], female    DOB: [birth year], 86 y.o.   MRN: [id number]  Patient Care Team: Baruch Gouty, FNP as PCP - General (Family Medicine) Leonie Man, MD as PCP - Cardiology (Cardiology)   Chief Complaint:  Medical Management of Chronic Issues   HPI: [name] is a 86 y.o. female presenting on 08/06/2022 for Medical Management of Chronic Issues   Since last visit, pts husband passed away. Pt was his primary caregiver and has not been caring for herself as well as she could. States she has not made her health a priority.   1. Diet-controlled diabetes mellitus ([name]) Diet controlled. She was on metformin in the past but has not been on this for several years. Her A1Cs have been very well controlled. She denies polyuria, polyphagia, or polydipsia. She did have an eye exam recently with Dr. Nicoletta Dress, those records have been requested.   2. Hyperlipidemia associated with type 2 diabetes mellitus ([name]) Pt is on statin therapy and tolerating well. No myalgias reported. She tries to follow a healthy diet. Does not exercise on a regular basis.   3. Hypertension associated with diabetes ([name]) Blood pressures continue to fluctuate but pt remains asymptomatic. Her husband recently passed and she has started paying more attention to her own health. She is scheduled to have spinal injections for chronic back pain, I feel it is safe for her to proceed with this with current BP range.   4. Hypertensive heart disease with chronic diastolic congestive heart failure ([name]) She does report intermittent ankle swelling. States she has been out of her lasix for months. Was going to take on but it expired in 2019. States the swelling has improved slightly but is still present. She denies shortness of breath, PND, orthopnea, chest pain, or palpitations. No weakness or confusion.   5. Severe calcific aortic valve stenosis Echo from 05/2022 reviewed and revealed worsening  aortic valve stenosis. She was to follow up to discuss TAVR but husband was placed in hospice. He has since passed and she does wish to follow up.    6. Insomnia This has been ongoing for several months. Improved slightly but still having issues. She has tried benadryl without relief of symptoms. Denies excessive caffeine use.   Relevant past medical, surgical, family, and social history reviewed and updated as indicated.  Allergies and medications reviewed and updated. Data reviewed: Chart in Epic.   Past Medical History:  Diagnosis Date   Anxiety    CAD S/P percutaneous coronary angioplasty 1990s, 11/28/2011    a) midRCA PCI 1999 (NIR BMS 3.0 mm x 32 mm); distal  RCA BMS Sept 2012 (Vision BMS 2.5 mm x 15 mm); 12/02: RCA ISR --> Cutting PTCA    Coronary stent restenosis due to progression of disease 11/30/2011   Dyslipidemia, goal LDL below 70 11/28/2011   Essential hypertension    On ACE inhibitor and ARB? As well as atenolol and hydralazine   Gastric ulcer 11/2018   GERD (gastroesophageal reflux disease)    History of blood transfusion    without reaction   Moderate aortic stenosis by prior echocardiogram 08/2011; 08/2013   Echo 11/09/18: EF 60-65%. Gr 2 DD.  No RWMA.  Mild-Mod AS (mean gradient 19 mmHg). Mild-Mod PAH. ->  Follow-up echo March 2021 showed mean gradient 17 mmHg (dimensionless index 0.32)-this was reported as moderate-severe AS   Myocardial infarction [facility]) 1990s   Had PTCA then PCI on  RCA; 11/2011--> Dyspnea and Shoulder pain prior to adm    Non-ST elevation MI (NSTEMI) ([place]) 11/28/2011   Dyspnea and Shoulder pain prior to adm   Type 2 diabetes mellitus with complication, without long-term current use of insulin ([place])    no longer taking diabetic medication - diet controlled    Past Surgical History:  Procedure Laterality Date   ABDOMINAL HYSTERECTOMY     BIOPSY  06/03/2018   Procedure: BIOPSY;  Surgeon: Rogene Houston, MD;  Location[facility];  Service:  Endoscopy;;  gastric    BREAST BIOPSY     CARDIAC CATHETERIZATION  2012   performed for angina   CARDIAC CATHETERIZATION     CARDIOVERSION N/A 10/24/2020   Procedure: CARDIOVERSION;  Surgeon: Jerline Pain, MD;  Location: [place] ENDOSCOPY;  Service: Cardiovascular;  Laterality: N/A;   CORONARY ANGIOPLASTY  11/2006   Cutting Balloon of stent in the RCA   ESOPHAGOGASTRODUODENOSCOPY N/A 06/03/2018   Procedure: ESOPHAGOGASTRODUODENOSCOPY (EGD);  Surgeon: Rogene Houston, MD;  Location: [facility];  Service: Endoscopy;  Laterality: N/A;  1:40   ESOPHAGOGASTRODUODENOSCOPY N/A 12/02/2018   Procedure: ESOPHAGOGASTRODUODENOSCOPY (EGD);  Surgeon: Rogene Houston, MD;  Location: [facility];  Service: Endoscopy;  Laterality: N/A;  255   LEFT HEART CATHETERIZATION WITH CORONARY ANGIOGRAM N/A 11/27/2011   Procedure: LEFT HEART CATHETERIZATION WITH CORONARY ANGIOGRAM;  Surgeon: Leonie Man, MD;  Location: [facility] CATH LAB;  Service: Cardiovascular;  Laterality: N/A;   PERCUTANEOUS CORONARY STENT INTERVENTION (PCI-S)  1999   Mid RCA - NIR DMS 3.0 mm x 30 mm, dRCA Vision BMS 2.5 mm x 15 mm    TRANSTHORACIC ECHOCARDIOGRAM  10/2018   EF 60-65%. Gr 2 DD.  No RWMA.  Mild-Mod AS  (mean gradient 19 mmHg). Mild-Mod PAH.   TRANSTHORACIC ECHOCARDIOGRAM  03/01/2020   EF 70 to 75%.  No or WMA.  Mild LVH.  GRII DD with severe LA enlargement.  Mildly elevated PAP.  Mild MR.  Moderate AS (mean gradient 17 mmHg) with dimensionless index of 0.32.   TUBAL LIGATION      Social History   Socioeconomic History   Marital status: Married    Spouse name: Carloyn Manner   Number of children: 3   Years of education: Not on file   Highest education level: Not on file  Occupational History   Not on file  Tobacco Use   Smoking status: Former   Smokeless tobacco: Never   Tobacco comments:    Quit approximately 1993  Vaping Use   Vaping Use: Never used  Substance and Sexual Activity   Alcohol use: No   Drug use: No   Sexual  activity: Not Currently    Birth control/protection: Post-menopausal  Other Topics Concern   Not on file  Social History Narrative   She is a married mother of 78, 2 daughters, 1 son who all live out of state but talks to weekly.   Step-son and his family live next door. Sees often.    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    Quit smoking in '99, denies alcohol.   She volunteers for [employer].   Social Determinants of Health   Financial Resource Strain: Low Risk  (06/18/2022)   Overall Financial Resource Strain (CARDIA)    Difficulty of Paying Living Expenses: Not hard at all  Food Insecurity: No Food Insecurity (06/18/2022)   Hunger Vital Sign    Worried About Running Out of  Food in the Last Year: Never true    [place] in the Last Year: Never true  Transportation Needs: No Transportation Needs (06/18/2022)   PRAPARE - Hydrologist (Medical): No    Lack of Transportation (Non-Medical): No  Physical Activity: Inactive (06/18/2022)   Exercise Vital Sign    Days of Exercise per Week: 0 days    Minutes of Exercise per Session: 0 min  Stress: No Stress Concern Present (06/18/2022)   [place]    Feeling of Stress : Only a little  Social Connections: Moderately Integrated (06/18/2022)   Social Connection and Isolation Panel [NHANES]    Frequency of Communication with Friends and Family: More than three times a week    Frequency of Social Gatherings with Friends and Family: More than three times a week    Attends Religious Services: 1 to 4 times per year    Active Member of [employer] or Organizations: No    Attends Archivist Meetings: Never    Marital Status: Married  Human resources officer Violence: Not At Risk (06/18/2022)   Humiliation, Afraid, Rape, and Kick questionnaire    Fear of Current or Ex-Partner: No    Emotionally Abused: No    Physically Abused: No     Sexually Abused: No    Outpatient Encounter Medications as of 08/06/2022  Medication Sig   acetaminophen (TYLENOL) 500 MG tablet Take 1,000 mg by mouth 2 (two) times daily as needed (pain.).   apixaban (ELIQUIS) 2.5 MG TABS tablet Take 1 tablet (2.5 mg total) by mouth 2 (two) times daily.   atorvastatin (LIPITOR) 20 MG tablet Take 20 mg by mouth at bedtime.    benazepril (LOTENSIN) 40 MG tablet Take 1 tablet (40 mg total) by mouth daily.   carvedilol (COREG) 25 MG tablet Take 1 tablet (25 mg total) by mouth 2 (two) times daily.   diclofenac Sodium (VOLTAREN) 1 % GEL Apply 2-4 g topically 2 (two) times daily as needed (back/hand pain.).    hydrALAZINE (APRESOLINE) 100 MG tablet TAKE ONE TABLET BY MOUTH TWICE DAILY (Patient taking differently: Take 100 mg by mouth 2 (two) times daily.)   NITROSTAT 0.4 MG SL tablet DISSOLVE 1 TAB UNDER TOUNGE FOR CHEST PAIN. MAY REPEAT EVERY 5 MINUTES FOR 3 DOSES. IF NO RELIEF CALL 911 OR GO TO ER (Patient taking differently: Place 0.4 mg under the tongue every 5 (five) minutes as needed for chest pain.)   sertraline (ZOLOFT) 25 MG tablet TAKE ONE (1) TABLET BY MOUTH EVERY DAY   traMADol (ULTRAM) 50 MG tablet Take 1 tablet (50 mg total) by mouth every 6 (six) hours as needed. for pain   [DISCONTINUED] furosemide (LASIX) 40 MG tablet Take 40 mg by mouth daily as needed for fluid.   furosemide (LASIX) 40 MG tablet Take 1 tablet (40 mg total) by mouth daily as needed for fluid.   No facility-administered encounter medications on file as of 08/06/2022.    Allergies  Allergen Reactions   Valium Nausea And Vomiting    Review of Systems  Constitutional:  Negative for activity change, appetite change, chills, diaphoresis, fatigue, fever and unexpected weight change.  HENT: Negative.    Eyes: Negative.  Negative for photophobia and visual disturbance.  Respiratory:  Negative for cough, chest tightness and shortness of breath.   Cardiovascular:  Positive for  palpitations. Negative for chest pain and leg swelling.  Gastrointestinal:  Negative for abdominal pain, blood in stool, constipation, diarrhea, nausea and vomiting.  Endocrine: Negative.  Negative for polydipsia, polyphagia and polyuria.  Genitourinary:  Negative for decreased urine volume, difficulty urinating, dysuria, frequency and urgency.  Musculoskeletal:  Positive for arthralgias, back pain and gait problem. Negative for joint swelling, myalgias, neck pain and neck stiffness.  Skin: Negative.   Allergic/Immunologic: Negative.   Neurological:  Negative for dizziness, tremors, seizures, syncope, facial asymmetry, speech difficulty, weakness, light-headedness, numbness and headaches.  Hematological: Negative.   Psychiatric/Behavioral:  Positive for sleep disturbance. Negative for confusion, hallucinations and suicidal ideas.   All other systems reviewed and are negative.       Objective:  BP (!) 151/65   Pulse 82   Temp 98.7 F (37.1 C)   Ht '5\' 3"'  (1.6 m)   Wt 130 lb (59 kg)   SpO2 97%   BMI 23.03 kg/m    Wt Readings from Last 3 Encounters:  08/06/22 130 lb (59 kg)  06/18/22 126 lb (57.2 kg)  05/20/22 126 lb (57.2 kg)    Physical Exam Vitals and nursing note reviewed.  Constitutional:      General: She is not in acute distress.    Appearance: Normal appearance. She is not ill-appearing, toxic-appearing or diaphoretic.  HENT:     Head: Normocephalic and atraumatic.     Mouth/Throat:     Mouth: Mucous membranes are moist.  Eyes:     Conjunctiva/sclera: Conjunctivae normal.     Pupils: Pupils are equal, round, and reactive to light.  Cardiovascular:     Rate and Rhythm: Normal rate. Rhythm irregularly irregular.     Heart sounds: Murmur heard.     Systolic murmur is present with a grade of 3/6.  Pulmonary:     Breath sounds: Normal breath sounds. No rhonchi or rales.  Musculoskeletal:     Right lower leg: 1+ Edema present.     Left lower leg: 1+ Edema present.   Skin:    General: Skin is warm and dry.  Neurological:     General: No focal deficit present.     Mental Status: She is alert and oriented to person, place, and time.     Gait: Gait abnormal (antalgic, using rolling walker).  Psychiatric:        Mood and Affect: Mood normal.        Behavior: Behavior normal.        Thought Content: Thought content normal.        Judgment: Judgment normal.     Results for orders placed or performed in visit on 08/06/22  Bayer DCA Hb A1c Waived  Result Value Ref Range   HB A1C (BAYER DCA - WAIVED) 5.4 4.8 - 5.6 %       Pertinent labs & imaging results that were available during my care of the patient were reviewed by me and considered in my medical decision making.  Assessment & Plan:  [name] was seen today for medical management of chronic issues.  Diagnoses and all orders for this visit:  Diet-controlled diabetes mellitus ([name]) A1C remains below 6 with dietary management. Recent eye exam was completed and records have been requested. Other labs pending.  -     CBC with Differential/Platelet -     CMP14+EGFR -     Bayer DCA Hb A1c Waived  Hyperlipidemia associated with type 2 diabetes mellitus ([name]) Diet encouraged - increase intake of fresh fruits and vegetables, increase intake of lean proteins. Bake,  broil, or grill foods. Avoid fried, greasy, and fatty foods. Avoid fast foods. Increase intake of fiber-rich whole grains. Exercise encouraged - at least 150 minutes per week and advance as tolerated. Continue medications as prescribed. Follow up in 3-6 months as discussed.  -     CBC with Differential/Platelet -     CMP14+EGFR -     Bayer DCA Hb A1c Waived  Hypertension associated with diabetes ([name]) Better controlled and I feel this will continue to improve now that pt is able to focus on her health. DASH diet and exercise discussed in detail. Labs pending.  -     CBC with Differential/Platelet -     CMP14+EGFR -     Bayer DCA Hb A1c  Waived  Hypertensive heart disease with chronic diastolic congestive heart failure ([name]) Lasix refilled today. Pt aware to take for next three days to decrease swelling. Then pt can take as needed for swelling.  -     furosemide (LASIX) 40 MG tablet; Take 1 tablet (40 mg total) by mouth daily as needed for fluid.  Severe calcific aortic valve stenosis Pt to call and scheduled appointment to discuss TAVR with cardiology.    Primary insomnia Pt reports difficulty sleeping. Has not tried melatonin. Discussed use of melatonin and proper dosing along with sleep hygiene measures.   Continue all other maintenance medications.  Follow up plan: Return in about 3 months (around 11/06/2022).   Continue healthy lifestyle choices, including diet (rich in fruits, vegetables, and lean proteins, and low in salt and simple carbohydrates) and exercise (at least 30 minutes of moderate physical activity daily).  Educational handout given for DASH diet  The above assessment and management plan was discussed with the patient. The patient verbalized understanding of and has agreed to the management plan. Patient is aware to call the clinic if they develop any new symptoms or if symptoms persist or worsen. Patient is aware when to return to the clinic for a follow-up visit. Patient educated on when it is appropriate to go to the emergency department.   Monia Pouch, FNP-C [facility] [phone]

## 2022-08-06 NOTE — Patient Instructions (Addendum)
Melatonin: start at 3 mg nightly, can increase every 3 nights as needed, up to 15 mg max.  Goal BP:  For patients younger than 60: Goal BP < 140/90. For patients 60 and older: Goal BP < 150/90. For patients with diabetes: Goal BP < 140/90.  Take your medications faithfully as prescribed. Maintain a healthy weight. Get at least 150 minutes of aerobic exercise per week. Minimize salt intake, less than 2000 mg per day. Minimize alcohol intake.  DASH Eating Plan DASH stands for "Dietary Approaches to Stop Hypertension." The DASH eating plan is a healthy eating plan that has been shown to reduce high blood pressure (hypertension). Additional health benefits may include reducing the risk of type 2 diabetes mellitus, heart disease, and stroke. The DASH eating plan may also help with weight loss.  WHAT DO I NEED TO KNOW ABOUT THE DASH EATING PLAN? For the DASH eating plan, you will follow these general guidelines: Choose foods with a percent daily value for sodium of less than 5% (as listed on the food label). Use salt-free seasonings or herbs instead of table salt or sea salt. Check with your health care provider or pharmacist before using salt substitutes. Eat lower-sodium products, often labeled as "lower sodium" or "no salt added." Eat fresh foods. Eat more vegetables, fruits, and low-fat dairy products. Choose whole grains. Look for the word "whole" as the first word in the ingredient list. Choose fish and skinless chicken or [place] more often than red meat. Limit fish, poultry, and meat to 6 oz (170 g) each day. Limit sweets, desserts, sugars, and sugary drinks. Choose heart-healthy fats. Limit cheese to 1 oz (28 g) per day. Eat more home-cooked food and less restaurant, buffet, and fast food. Limit fried foods. Cook foods using methods other than frying. Limit canned vegetables. If you do use them, rinse them well to decrease the sodium. When eating at a restaurant, ask that your food  be prepared with less salt, or no salt if possible.  WHAT FOODS CAN I EAT? Seek help from a dietitian for individual calorie needs.  Grains Whole grain or whole wheat bread. Brown rice. Whole grain or whole wheat pasta. Quinoa, bulgur, and whole grain cereals. Low-sodium cereals. Corn or whole wheat flour tortillas. Whole grain cornbread. Whole grain crackers. Low-sodium crackers.  Vegetables Fresh or frozen vegetables (raw, steamed, roasted, or grilled). Low-sodium or reduced-sodium tomato and vegetable juices. Low-sodium or reduced-sodium tomato sauce and paste. Low-sodium or reduced-sodium canned vegetables.   Fruits All fresh, canned (in natural juice), or frozen fruits.  Meat and Other Protein Products Ground beef (85% or leaner), grass-fed beef, or beef trimmed of fat. Skinless chicken or [place]. Ground chicken or [place]. Pork trimmed of fat. All fish and seafood. Eggs. Dried beans, peas, or lentils. Unsalted nuts and seeds. Unsalted canned beans.  Dairy Low-fat dairy products, such as skim or 1% milk, 2% or reduced-fat cheeses, low-fat ricotta or cottage cheese, or plain low-fat yogurt. Low-sodium or reduced-sodium cheeses.  Fats and Oils Tub margarines without trans fats. Light or reduced-fat mayonnaise and salad dressings (reduced sodium). Avocado. Safflower, olive, or canola oils. Natural peanut or almond butter.  Other Unsalted popcorn and pretzels. The items listed above may not be a complete list of recommended foods or beverages. Contact your dietitian for more options.  WHAT FOODS ARE NOT RECOMMENDED?  Grains White bread. White pasta. White rice. Refined cornbread. Bagels and croissants. Crackers that contain trans fat.  Vegetables Creamed or fried vegetables. Vegetables  in a cheese sauce. Regular canned vegetables. Regular canned tomato sauce and paste. Regular tomato and vegetable juices.  Fruits Dried fruits. Canned fruit in light or heavy syrup. Fruit  juice.  Meat and Other Protein Products Fatty cuts of meat. Ribs, chicken wings, bacon, sausage, bologna, salami, chitterlings, fatback, hot dogs, bratwurst, and packaged luncheon meats. Salted nuts and seeds. Canned beans with salt.  Dairy Whole or 2% milk, cream, half-and-half, and cream cheese. Whole-fat or sweetened yogurt. Full-fat cheeses or blue cheese. Nondairy creamers and whipped toppings. Processed cheese, cheese spreads, or cheese curds.  Condiments Onion and garlic salt, seasoned salt, table salt, and sea salt. Canned and packaged gravies. Worcestershire sauce. Tartar sauce. Barbecue sauce. Teriyaki sauce. Soy sauce, including reduced sodium. Steak sauce. Fish sauce. Oyster sauce. Cocktail sauce. Horseradish. Ketchup and mustard. Meat flavorings and tenderizers. Bouillon cubes. Hot sauce. Tabasco sauce. Marinades. Taco seasonings. Relishes.  Fats and Oils Butter, stick margarine, lard, shortening, ghee, and bacon fat. Coconut, palm kernel, or palm oils. Regular salad dressings.  Other Pickles and olives. Salted popcorn and pretzels.  The items listed above may not be a complete list of foods and beverages to avoid. Contact your dietitian for more information.  WHERE CAN I FIND MORE INFORMATION? National Heart, Lung, and Blood Institute: [employer] Document Released: 11/27/2011 Document Revised: 04/24/2014 Document Reviewed: 10/12/2013 [facility] Patient Information 2015 [place], [place]. This information is not intended to replace advice given to you by your health care provider. Make sure you discuss any questions you have with your health care provider.   I think that you would greatly benefit from seeing a nutritionist.  If you are interested, please call Dr. Gerilyn Pilgrim at [phone] to schedule an appointment.

## 2022-08-07 LAB — CMP14+EGFR
ALT: 14 IU/L (ref 0–32)
AST: 18 IU/L (ref 0–40)
Albumin/Globulin Ratio: 1.9 (ref 1.2–2.2)
Albumin: 4.3 g/dL (ref 3.7–4.7)
Alkaline Phosphatase: 59 IU/L (ref 44–121)
BUN/Creatinine Ratio: 21 (ref 12–28)
BUN: 17 mg/dL (ref 8–27)
Bilirubin Total: 0.4 mg/dL (ref 0.0–1.2)
CO2: 24 mmol/L (ref 20–29)
Calcium: 9.5 mg/dL (ref 8.7–10.3)
Chloride: 107 mmol/L — ABNORMAL HIGH (ref 96–106)
Creatinine, Ser: 0.8 mg/dL (ref 0.57–1.00)
Globulin, Total: 2.3 g/dL (ref 1.5–4.5)
Glucose: 100 mg/dL — ABNORMAL HIGH (ref 70–99)
Potassium: 4.7 mmol/L (ref 3.5–5.2)
Sodium: 147 mmol/L — ABNORMAL HIGH (ref 134–144)
Total Protein: 6.6 g/dL (ref 6.0–8.5)
eGFR: 73 mL/min/{1.73_m2} (ref >59)

## 2022-08-07 LAB — CBC WITH DIFFERENTIAL/PLATELET
Basophils Absolute: 0 10*3/uL (ref 0.0–0.2)
Basos: 1 %
EOS (ABSOLUTE): 0.2 10*3/uL (ref 0.0–0.4)
Eos: 4 %
Hematocrit: 32.1 % — ABNORMAL LOW (ref 34.0–46.6)
Hemoglobin: 10.7 g/dL — ABNORMAL LOW (ref 11.1–15.9)
Immature Grans (Abs): 0 10*3/uL (ref 0.0–0.1)
Immature Granulocytes: 0 %
Lymphocytes Absolute: 1 10*3/uL (ref 0.7–3.1)
Lymphs: 21 %
MCH: 32.7 pg (ref 26.6–33.0)
MCHC: 33.3 g/dL (ref 31.5–35.7)
MCV: 98 fL — ABNORMAL HIGH (ref 79–97)
Monocytes Absolute: 0.5 10*3/uL (ref 0.1–0.9)
Monocytes: 11 %
Neutrophils Absolute: 3 10*3/uL (ref 1.4–7.0)
Neutrophils: 63 %
Platelets: 167 10*3/uL (ref 150–450)
RBC: 3.27 x10E6/uL — ABNORMAL LOW (ref 3.77–5.28)
RDW: 11.8 % (ref 11.7–15.4)
WBC: 4.6 10*3/uL (ref 3.4–10.8)

## 2022-08-19 ENCOUNTER — Telehealth: Payer: Self-pay

## 2022-08-19 DIAGNOSIS — G8929 Other chronic pain: Secondary | ICD-10-CM

## 2022-08-19 MED ORDER — TRAMADOL HCL 50 MG PO TABS: 50.0000 mg | ORAL_TABLET | Freq: Four times a day (QID) | ORAL | 1 refills | Status: DC | PRN

## 2022-08-19 NOTE — Telephone Encounter (Signed)
Patient requesting a refill of Tramadol.   Filled  Written  ID  Drug  QTY  Days  Prescriber  RX #  Dispenser  Refill  Daily Dose*  Pymt Type  PMP  08/01/2022 07/18/2022 1  Tramadol Hcl 50 Mg Tablet 30.00 7 An Kir [id number] The (5093) 1/1 42.86 MME Medicare [place] 07/18/2022 07/18/2022 1  Tramadol Hcl 50 Mg Tablet 30.00 8 An Kir [id number] The (5093) 0/1 37.50 MME Medicare [place]

## 2022-08-20 ENCOUNTER — Ambulatory Visit (INDEPENDENT_AMBULATORY_CARE_PROVIDER_SITE_OTHER): Payer: Medicare Other | Admitting: Family Medicine

## 2022-08-20 ENCOUNTER — Encounter: Payer: Self-pay | Admitting: Family Medicine

## 2022-08-20 VITALS — BP 141/67 | HR 87 | Temp 98.8°F | Ht 63.0 in | Wt 126.0 lb

## 2022-08-20 DIAGNOSIS — M5441 Lumbago with sciatica, right side: Secondary | ICD-10-CM

## 2022-08-20 DIAGNOSIS — K921 Melena: Secondary | ICD-10-CM

## 2022-08-20 DIAGNOSIS — Z8711 Personal history of peptic ulcer disease: Secondary | ICD-10-CM

## 2022-08-20 DIAGNOSIS — G8929 Other chronic pain: Secondary | ICD-10-CM

## 2022-08-20 LAB — CBC WITH DIFFERENTIAL/PLATELET
Basophils Absolute: 0 10*3/uL (ref 0.0–0.2)
Basos: 1 %
EOS (ABSOLUTE): 0.1 10*3/uL (ref 0.0–0.4)
Eos: 3 %
Hematocrit: 29.1 % — ABNORMAL LOW (ref 34.0–46.6)
Hemoglobin: 9.9 g/dL — ABNORMAL LOW (ref 11.1–15.9)
Immature Grans (Abs): 0 10*3/uL (ref 0.0–0.1)
Immature Granulocytes: 0 %
Lymphocytes Absolute: 1.2 10*3/uL (ref 0.7–3.1)
Lymphs: 22 %
MCH: 33.7 pg — ABNORMAL HIGH (ref 26.6–33.0)
MCHC: 34 g/dL (ref 31.5–35.7)
MCV: 99 fL — ABNORMAL HIGH (ref 79–97)
Monocytes Absolute: 0.5 10*3/uL (ref 0.1–0.9)
Monocytes: 9 %
Neutrophils Absolute: 3.5 10*3/uL (ref 1.4–7.0)
Neutrophils: 65 %
Platelets: 183 10*3/uL (ref 150–450)
RBC: 2.94 x10E6/uL — ABNORMAL LOW (ref 3.77–5.28)
RDW: 12.3 % (ref 11.7–15.4)
WBC: 5.3 10*3/uL (ref 3.4–10.8)

## 2022-08-20 MED ORDER — PANTOPRAZOLE SODIUM 40 MG PO TBEC: 40.0000 mg | DELAYED_RELEASE_TABLET | Freq: Every day | ORAL | 3 refills | Status: DC

## 2022-08-20 MED ORDER — TRAMADOL HCL 50 MG PO TABS: 50.0000 mg | ORAL_TABLET | Freq: Four times a day (QID) | ORAL | 1 refills | Status: DC | PRN

## 2022-08-20 NOTE — Addendum Note (Signed)
Addended by: Sonny Masters on: 08/20/2022 03:41 PM   Modules accepted: Orders

## 2022-08-20 NOTE — Progress Notes (Signed)
Subjective:  Patient ID: [name], female    DOB: Feb 27, 1936, 86 y.o.   MRN: [id number]  Patient Care Team: Baruch Gouty, FNP as PCP - General (Family Medicine) Leonie Man, MD as PCP - Cardiology (Cardiology) Harlen Labs, MD as Referring Physician (Optometry)   Chief Complaint:  Blood In Stools   HPI: [name] is a 86 y.o. female presenting on 08/20/2022 for Blood In Stools   Pt presents today with complaints of dark, tarry, black stools since yesterday. She has had three bowel movements this color. Denies associated pain. Has had this in the past and underwent EGD which revealed gastric ulcers. She denies any changes in diet or medications. She is on Eliquis daily for chronic A-Fib.   Rectal Bleeding  The current episode started yesterday. The onset was sudden. The problem has been unchanged. The patient is experiencing no pain. The stool is described as soft (black and tarry in color). There was no prior successful therapy. Pertinent negatives include no anorexia, no fever, no abdominal pain, no diarrhea, no hematemesis, no hemorrhoids, no nausea, no rectal pain, no vomiting, no hematuria, no vaginal bleeding, no vaginal discharge, no chest pain, no headaches, no coughing, no difficulty breathing and no rash.   Relevant past medical, surgical, family, and social history reviewed and updated as indicated.  Allergies and medications reviewed and updated. Data reviewed: Chart in Epic.   Past Medical History:  Diagnosis Date   Anxiety    CAD S/P percutaneous coronary angioplasty 1990s, 11/28/2011    a) midRCA PCI 1999 (NIR BMS 3.0 mm x 32 mm); distal  RCA BMS Sept 2012 (Vision BMS 2.5 mm x 15 mm); 12/02: RCA ISR --> Cutting PTCA    Coronary stent restenosis due to progression of disease 11/30/2011   Dyslipidemia, goal LDL below 70 11/28/2011   Essential hypertension    On ACE inhibitor and ARB? As well as atenolol and hydralazine   Gastric ulcer  11/2018   GERD (gastroesophageal reflux disease)    History of blood transfusion    without reaction   Moderate aortic stenosis by prior echocardiogram 08/2011; 08/2013   Echo 11/09/18: EF 60-65%. Gr 2 DD.  No RWMA.  Mild-Mod AS (mean gradient 19 mmHg). Mild-Mod PAH. ->  Follow-up echo March 2021 showed mean gradient 17 mmHg (dimensionless index 0.32)-this was reported as moderate-severe AS   Myocardial infarction ([place]) 1990s   Had PTCA then PCI on RCA; 11/2011--> Dyspnea and Shoulder pain prior to adm    Non-ST elevation MI (NSTEMI) ([place]) 11/28/2011   Dyspnea and Shoulder pain prior to adm   Type 2 diabetes mellitus with complication, without long-term current use of insulin ([place])    no longer taking diabetic medication - diet controlled    Past Surgical History:  Procedure Laterality Date   ABDOMINAL HYSTERECTOMY     BIOPSY  06/03/2018   Procedure: BIOPSY;  Surgeon: Rogene Houston, MD;  Location: [facility];  Service: Endoscopy;;  gastric    BREAST BIOPSY     CARDIAC CATHETERIZATION  2012   performed for angina   CARDIAC CATHETERIZATION     CARDIOVERSION N/A 10/24/2020   Procedure: CARDIOVERSION;  Surgeon: Jerline Pain, MD;  Location: [facility];  Service: Cardiovascular;  Laterality: N/A;   CORONARY ANGIOPLASTY  11/2006   Cutting Balloon of stent in the RCA   ESOPHAGOGASTRODUODENOSCOPY N/A 06/03/2018   Procedure: ESOPHAGOGASTRODUODENOSCOPY (EGD);  Surgeon: Rogene Houston, MD;  Location: [facility];  Service: Endoscopy;  Laterality: N/A;  1:40   ESOPHAGOGASTRODUODENOSCOPY N/A 12/02/2018   Procedure: ESOPHAGOGASTRODUODENOSCOPY (EGD);  Surgeon: Rogene Houston, MD;  Location: [facility];  Service: Endoscopy;  Laterality: N/A;  255   LEFT HEART CATHETERIZATION WITH CORONARY ANGIOGRAM N/A 11/27/2011   Procedure: LEFT HEART CATHETERIZATION WITH CORONARY ANGIOGRAM;  Surgeon: Leonie Man, MD;  Location[facility];  Service: Cardiovascular;  Laterality: N/A;    PERCUTANEOUS CORONARY STENT INTERVENTION (PCI-S)  1999   Mid RCA - NIR DMS 3.0 mm x 30 mm, dRCA Vision BMS 2.5 mm x 15 mm    TRANSTHORACIC ECHOCARDIOGRAM  10/2018   EF 60-65%. Gr 2 DD.  No RWMA.  Mild-Mod AS  (mean gradient 19 mmHg). Mild-Mod PAH.   TRANSTHORACIC ECHOCARDIOGRAM  03/01/2020   EF 70 to 75%.  No or WMA.  Mild LVH.  GRII DD with severe LA enlargement.  Mildly elevated PAP.  Mild MR.  Moderate AS (mean gradient 17 mmHg) with dimensionless index of 0.32.   TUBAL LIGATION      Social History   Socioeconomic History   Marital status: Married    Spouse name: Carloyn Manner   Number of children: 3   Years of education: Not on file   Highest education level: Not on file  Occupational History   Not on file  Tobacco Use   Smoking status: Former   Smokeless tobacco: Never   Tobacco comments:    Quit approximately 1993  Vaping Use   Vaping Use: Never used  Substance and Sexual Activity   Alcohol use: No   Drug use: No   Sexual activity: Not Currently    Birth control/protection: Post-menopausal  Other Topics Concern   Not on file  Social History Narrative   She is a married mother of 61, 2 daughters, 1 son who all live out of state but talks to weekly.   Step-son and his family live next door. Sees often.    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    Quit smoking in '99, denies alcohol.   She volunteers for [employer].   Social Determinants of Health   Financial Resource Strain: Low Risk  (06/18/2022)   Overall Financial Resource Strain (CARDIA)    Difficulty of Paying Living Expenses: Not hard at all  Food Insecurity: No Food Insecurity (06/18/2022)   Hunger Vital Sign    Worried About Running Out of Food in the Last Year: Never true    Ran Out of Food in the Last Year: Never true  Transportation Needs: No Transportation Needs (06/18/2022)   PRAPARE - Hydrologist (Medical): No    Lack of Transportation (Non-Medical): No  Physical  Activity: Inactive (06/18/2022)   Exercise Vital Sign    Days of Exercise per Week: 0 days    Minutes of Exercise per Session: 0 min  Stress: No Stress Concern Present (06/18/2022)   [place]    Feeling of Stress : Only a little  Social Connections: Moderately Integrated (06/18/2022)   Social Connection and Isolation Panel [NHANES]    Frequency of Communication with Friends and Family: More than three times a week    Frequency of Social Gatherings with Friends and Family: More than three times a week    Attends Religious Services: 1 to 4 times per year    Active Member of Clubs or Organizations: No  Attends Archivist Meetings: Never    Marital Status: Married  Human resources officer Violence: Not At Risk (06/18/2022)   Humiliation, Afraid, Rape, and Kick questionnaire    Fear of Current or Ex-Partner: No    Emotionally Abused: No    Physically Abused: No    Sexually Abused: No    Outpatient Encounter Medications as of 08/20/2022  Medication Sig   acetaminophen (TYLENOL) 500 MG tablet Take 1,000 mg by mouth 2 (two) times daily as needed (pain.).   apixaban (ELIQUIS) 2.5 MG TABS tablet Take 1 tablet (2.5 mg total) by mouth 2 (two) times daily.   atorvastatin (LIPITOR) 20 MG tablet Take 20 mg by mouth at bedtime.    benazepril (LOTENSIN) 40 MG tablet Take 1 tablet (40 mg total) by mouth daily.   carvedilol (COREG) 25 MG tablet Take 1 tablet (25 mg total) by mouth 2 (two) times daily.   diclofenac Sodium (VOLTAREN) 1 % GEL Apply 2-4 g topically 2 (two) times daily as needed (back/hand pain.).    furosemide (LASIX) 40 MG tablet Take 1 tablet (40 mg total) by mouth daily as needed for fluid.   hydrALAZINE (APRESOLINE) 100 MG tablet TAKE ONE TABLET BY MOUTH TWICE DAILY (Patient taking differently: Take 100 mg by mouth 2 (two) times daily.)   NITROSTAT 0.4 MG SL tablet DISSOLVE 1 TAB UNDER TOUNGE FOR CHEST PAIN. MAY REPEAT  EVERY 5 MINUTES FOR 3 DOSES. IF NO RELIEF CALL 911 OR GO TO ER (Patient taking differently: Place 0.4 mg under the tongue every 5 (five) minutes as needed for chest pain.)   pantoprazole (PROTONIX) 40 MG tablet Take 1 tablet (40 mg total) by mouth daily.   sertraline (ZOLOFT) 25 MG tablet TAKE ONE (1) TABLET BY MOUTH EVERY DAY   [DISCONTINUED] traMADol (ULTRAM) 50 MG tablet Take 1 tablet (50 mg total) by mouth every 6 (six) hours as needed. for pain   [DISCONTINUED] traMADol (ULTRAM) 50 MG tablet Take 1 tablet (50 mg total) by mouth every 6 (six) hours as needed. for pain   No facility-administered encounter medications on file as of 08/20/2022.    Allergies  Allergen Reactions   Valium Nausea And Vomiting    Review of Systems  Constitutional:  Negative for activity change, appetite change, chills, diaphoresis, fatigue, fever and unexpected weight change.  Respiratory:  Negative for cough and shortness of breath.   Cardiovascular:  Positive for palpitations. Negative for chest pain and leg swelling.  Gastrointestinal:  Positive for blood in stool and hematochezia. Negative for abdominal distention, abdominal pain, anal bleeding, anorexia, constipation, diarrhea, hematemesis, hemorrhoids, nausea, rectal pain and vomiting.  Genitourinary:  Negative for decreased urine volume, difficulty urinating, hematuria, vaginal bleeding and vaginal discharge.  Musculoskeletal:  Positive for arthralgias, back pain and gait problem.  Skin:  Negative for rash.  Neurological:  Negative for dizziness, tremors, seizures, syncope, facial asymmetry, speech difficulty, weakness, light-headedness, numbness and headaches.  Psychiatric/Behavioral:  Negative for confusion.   All other systems reviewed and are negative.       Objective:  BP (!) 141/67   Pulse 87   Temp 98.8 F (37.1 C)   Ht '5\' 3"'  (1.6 m)   Wt 126 lb (57.2 kg)   SpO2 99%   BMI 22.32 kg/m    Wt Readings from Last 3 Encounters:  08/20/22  126 lb (57.2 kg)  08/06/22 130 lb (59 kg)  06/18/22 126 lb (57.2 kg)    Physical Exam Vitals and nursing note  reviewed.  Constitutional:      General: She is not in acute distress.    Appearance: Normal appearance. She is normal weight. She is not ill-appearing or diaphoretic.  HENT:     Head: Normocephalic and atraumatic.     Mouth/Throat:     Mouth: Mucous membranes are moist.  Eyes:     Pupils: Pupils are equal, round, and reactive to light.  Cardiovascular:     Rate and Rhythm: Normal rate. Rhythm irregularly irregular.     Heart sounds: Murmur heard.     Systolic murmur is present with a grade of 4/6.  Pulmonary:     Effort: Pulmonary effort is normal.     Breath sounds: Normal breath sounds.  Abdominal:     General: Bowel sounds are normal. There is no distension.     Palpations: Abdomen is soft.     Tenderness: There is no abdominal tenderness.  Musculoskeletal:     Right lower leg: No edema.     Left lower leg: No edema.  Skin:    General: Skin is warm and dry.     Capillary Refill: Capillary refill takes less than 2 seconds.     Coloration: Skin is not pale.  Neurological:     General: No focal deficit present.     Mental Status: She is alert and oriented to person, place, and time.     Gait: Gait abnormal (antalgic, using rolling walker).  Psychiatric:        Mood and Affect: Mood normal.        Behavior: Behavior normal.        Thought Content: Thought content normal.        Judgment: Judgment normal.     Results for orders placed or performed in visit on 08/06/22  CBC with Differential/Platelet  Result Value Ref Range   WBC 4.6 3.4 - 10.8 x10E3/uL   RBC 3.27 (L) 3.77 - 5.28 x10E6/uL   Hemoglobin 10.7 (L) 11.1 - 15.9 g/dL   Hematocrit 32.1 (L) 34.0 - 46.6 %   MCV 98 (H) 79 - 97 fL   MCH 32.7 26.6 - 33.0 pg   MCHC 33.3 31.5 - 35.7 g/dL   RDW 11.8 11.7 - 15.4 %   Platelets 167 150 - 450 x10E3/uL   Neutrophils 63 Not Estab. %   Lymphs 21 Not Estab. %    Monocytes 11 Not Estab. %   Eos 4 Not Estab. %   Basos 1 Not Estab. %   Neutrophils Absolute 3.0 1.4 - 7.0 x10E3/uL   Lymphocytes Absolute 1.0 0.7 - 3.1 x10E3/uL   Monocytes Absolute 0.5 0.1 - 0.9 x10E3/uL   EOS (ABSOLUTE) 0.2 0.0 - 0.4 x10E3/uL   Basophils Absolute 0.0 0.0 - 0.2 x10E3/uL   Immature Granulocytes 0 Not Estab. %   Immature Grans (Abs) 0.0 0.0 - 0.1 x10E3/uL  CMP14+EGFR  Result Value Ref Range   Glucose 100 (H) 70 - 99 mg/dL   BUN 17 8 - 27 mg/dL   Creatinine, Ser 0.80 0.57 - 1.00 mg/dL   eGFR 73 >59 mL/min/1.73   BUN/Creatinine Ratio 21 12 - 28   Sodium 147 (H) 134 - 144 mmol/L   Potassium 4.7 3.5 - 5.2 mmol/L   Chloride 107 (H) 96 - 106 mmol/L   CO2 24 20 - 29 mmol/L   Calcium 9.5 8.7 - 10.3 mg/dL   Total Protein 6.6 6.0 - 8.5 g/dL   Albumin 4.3 3.7 - 4.7 g/dL  Globulin, Total 2.3 1.5 - 4.5 g/dL   Albumin/Globulin Ratio 1.9 1.2 - 2.2   Bilirubin Total 0.4 0.0 - 1.2 mg/dL   Alkaline Phosphatase 59 44 - 121 IU/L   AST 18 0 - 40 IU/L   ALT 14 0 - 32 IU/L  Bayer DCA Hb A1c Waived  Result Value Ref Range   HB A1C (BAYER DCA - WAIVED) 5.4 4.8 - 5.6 %       Pertinent labs & imaging results that were available during my care of the patient were reviewed by me and considered in my medical decision making.  Assessment & Plan:  [name] was seen today for blood in stools.  Diagnoses and all orders for this visit:  Black stools Personal history of gastric ulcer STAT CBC ordered. Will initiate PPI therapy. If hemoccult positive, will refer to GI. If Hgb and Hct low, will hold eliquis for 3 days and then restart. Pt aware of red flags. Further treatment pending results.  -     Fecal occult blood, imunochemical; Future -     pantoprazole (PROTONIX) 40 MG tablet; Take 1 tablet (40 mg total) by mouth daily. -     CBC with Differential/Platelet  Chronic bilateral low back pain with right-sided sciatica Followed by pain management and tramadol was filled yesterday.  [facility] and made aware to cancel prescription.  -     Discontinue: traMADol (ULTRAM) 50 MG tablet; Take 1 tablet (50 mg total) by mouth every 6 (six) hours as needed. for pain     Continue all other maintenance medications.  Follow up plan: Return if symptoms worsen or fail to improve.   Continue healthy lifestyle choices, including diet (rich in fruits, vegetables, and lean proteins, and low in salt and simple carbohydrates) and exercise (at least 30 minutes of moderate physical activity daily).   The above assessment and management plan was discussed with the patient. The patient verbalized understanding of and has agreed to the management plan. Patient is aware to call the clinic if they develop any new symptoms or if symptoms persist or worsen. Patient is aware when to return to the clinic for a follow-up visit. Patient educated on when it is appropriate to go to the emergency department.   Monia Pouch, FNP-C [facility] [phone]

## 2022-08-21 ENCOUNTER — Other Ambulatory Visit: Payer: Medicare Other

## 2022-08-21 ENCOUNTER — Other Ambulatory Visit: Payer: Self-pay

## 2022-08-21 DIAGNOSIS — K921 Melena: Secondary | ICD-10-CM

## 2022-08-21 DIAGNOSIS — Z8711 Personal history of peptic ulcer disease: Secondary | ICD-10-CM

## 2022-08-22 LAB — FECAL OCCULT BLOOD, IMMUNOCHEMICAL: Fecal Occult Bld: POSITIVE — AB

## 2022-08-27 ENCOUNTER — Encounter (INDEPENDENT_AMBULATORY_CARE_PROVIDER_SITE_OTHER): Payer: Self-pay | Admitting: *Deleted

## 2022-09-01 ENCOUNTER — Telehealth: Payer: Self-pay | Admitting: Physician Assistant

## 2022-09-01 NOTE — Telephone Encounter (Signed)
  [facility]   MULTIDISCIPLINARY HEART VALVE TEAM   Called patient to try to arranged a structural heart appt. Left VM to call back  Cline Crock PA-C  [facility]

## 2022-09-02 ENCOUNTER — Telehealth: Payer: Self-pay | Admitting: *Deleted

## 2022-09-02 ENCOUNTER — Ambulatory Visit (INDEPENDENT_AMBULATORY_CARE_PROVIDER_SITE_OTHER): Payer: Medicare Other | Admitting: Gastroenterology

## 2022-09-02 ENCOUNTER — Encounter: Payer: Self-pay | Admitting: Gastroenterology

## 2022-09-02 ENCOUNTER — Other Ambulatory Visit: Payer: Medicare Other

## 2022-09-02 VITALS — BP 121/66 | HR 97 | Temp 97.7°F | Ht 65.0 in | Wt 134.2 lb

## 2022-09-02 DIAGNOSIS — K921 Melena: Secondary | ICD-10-CM

## 2022-09-02 DIAGNOSIS — K219 Gastro-esophageal reflux disease without esophagitis: Secondary | ICD-10-CM

## 2022-09-02 DIAGNOSIS — Z8711 Personal history of peptic ulcer disease: Secondary | ICD-10-CM | POA: Diagnosis not present

## 2022-09-02 DIAGNOSIS — D62 Acute posthemorrhagic anemia: Secondary | ICD-10-CM | POA: Diagnosis not present

## 2022-09-02 DIAGNOSIS — R1013 Epigastric pain: Secondary | ICD-10-CM | POA: Diagnosis not present

## 2022-09-02 MED ORDER — SUCRALFATE 1 GM/10ML PO SUSP: 1.0000 g | Freq: Three times a day (TID) | ORAL | 0 refills | Status: DC

## 2022-09-02 MED ORDER — PANTOPRAZOLE SODIUM 40 MG PO TBEC: 40.0000 mg | DELAYED_RELEASE_TABLET | Freq: Two times a day (BID) | ORAL | 3 refills | Status: DC

## 2022-09-02 NOTE — Patient Instructions (Addendum)
Increase pantoprazole to 40mg  twice daily, before breakfast and evening meal. Add Carafate 1 gram four times a day fo next 10 days.  We will reach out to cardiology to try and get clearance for upper endoscopy ASAP. We will be in touch.  If you have further black stools, please hold Eliquis and go to the ER.  If you have worsening abdominal pain, please go to the ER.

## 2022-09-02 NOTE — H&P (View-Only) (Signed)
GI Office Note    Referring Provider: Sonny Masters, FNP Primary Care Physician:  April Masters, FNP  Primary Gastroenterologist: April Sessions, MD   Chief Complaint   Chief Complaint  Patient presents with   Abdominal Pain   Belepharitis    Also has a lot of belching. Seen [name] last for black tarry stools.      History of Present Illness   [name] is a 86 y.o. female presenting today at the request of April Silvius, FNP for black stools and personal history of gastric ulcer.   Patient presented to PCP last month with reports of melena occurring 2-3 days. No Pepto use or iron use. She has history of gastric ulcer in the past. She is on Eliqius for Afib. She saw her PCP. She was noted to have a decline in Hgb. Normal Hgb back in 02/2022. On 08/06/22, Hgb 10.7. On 08/20/22, Hgb 9.9. States she was started on pantoprazole. Stool hemoccult at home, was positive. Held Eliquis for 3 days. Stools cleared up.   Today, she states she started having epigastric pain several days ago. Pain is constant. Worse if moves a certain way. Appetite fair. Pain a little better when she eats. No N/V. Having a lot of heartburn and indigestion. Taking Catering manager. Has been under a lot of stress this past year. [name] had cancer and she was taking care of him at home with help of [name]. Passed away about a month ago.  She has history of gastric ulcers in 2019. She was using BC powders at that time. Denies any ASA/NSAIDs now.    She is due to see her cardiologist. She has history of CAD s/p stent, Afib, moderate to severe aortic stenosis. She had a recent updated ECHO. She needs to be seen in the structural heart clinic but this was postponed due to her [name]'s illness/death. She has occasional palpitations. Denies chest pain. She has had in     EGD 11/2018: - Normal esophagus. Healed esophagitis. - Z-line irregular, 37 cm from the incisors. - 2 cm hiatal hernia. - Erythematous  mucosa in the antrum. - Two scars in the gastric antrum indicative of healed ulcers. - Normal duodenal bulb and second portion of the duodenum. - No specimens collected.  EGD 05/2018 - LA Grade A reflux esophagitis. - 3 cm hiatal hernia. - Non-bleeding gastric ulcers. Biopsied. No H.pylori - Erosive gastropathy. - Normal duodenal bulb and second portion of the duodenum.      Medications   Current Outpatient Medications  Medication Sig Dispense Refill   acetaminophen (TYLENOL) 500 MG tablet Take 1,000 mg by mouth 2 (two) times daily as needed (pain.).     apixaban (ELIQUIS) 2.5 MG TABS tablet Take 1 tablet (2.5 mg total) by mouth 2 (two) times daily. 60 tablet 0   atorvastatin (LIPITOR) 20 MG tablet Take 20 mg by mouth at bedtime.      benazepril (LOTENSIN) 40 MG tablet Take 1 tablet (40 mg total) by mouth daily. 90 tablet 3   carvedilol (COREG) 25 MG tablet Take 1 tablet (25 mg total) by mouth 2 (two) times daily. 180 tablet 3   diclofenac Sodium (VOLTAREN) 1 % GEL Apply 2-4 g topically 2 (two) times daily as needed (back/hand pain.).      furosemide (LASIX) 40 MG tablet Take 1 tablet (40 mg total) by mouth daily as needed for fluid. 30 tablet 3   hydrALAZINE (APRESOLINE) 100 MG tablet  TAKE ONE TABLET BY MOUTH TWICE DAILY (Patient taking differently: Take 100 mg by mouth 2 (two) times daily.) 180 tablet 1   NITROSTAT 0.4 MG SL tablet DISSOLVE 1 TAB UNDER TOUNGE FOR CHEST PAIN. MAY REPEAT EVERY 5 MINUTES FOR 3 DOSES. IF NO RELIEF CALL 911 OR GO TO ER (Patient taking differently: Place 0.4 mg under the tongue every 5 (five) minutes as needed for chest pain.) 25 tablet 3   pantoprazole (PROTONIX) 40 MG tablet Take 1 tablet (40 mg total) by mouth daily. 30 tablet 3   sertraline (ZOLOFT) 25 MG tablet TAKE ONE (1) TABLET BY MOUTH EVERY DAY 90 tablet 0   traMADol (ULTRAM) 50 MG tablet Take by mouth every 6 (six) hours as needed.     No current facility-administered medications for this  visit.    Allergies   Allergies as of 09/02/2022 - Review Complete 09/02/2022  Allergen Reaction Noted   Valium Nausea And Vomiting 11/27/2011    Past Medical History   Past Medical History:  Diagnosis Date   Anxiety    CAD S/P percutaneous coronary angioplasty 1990s, 11/28/2011    a) midRCA PCI 1999 (NIR BMS 3.0 mm x 32 mm); distal  RCA BMS Sept 2012 (Vision BMS 2.5 mm x 15 mm); 12/02: RCA ISR --> Cutting PTCA    Coronary stent restenosis due to progression of disease 11/30/2011   Dyslipidemia, goal LDL below 70 11/28/2011   Essential hypertension    On ACE inhibitor and ARB? As well as atenolol and hydralazine   Gastric ulcer 11/2018   GERD (gastroesophageal reflux disease)    History of blood transfusion    without reaction   Moderate aortic stenosis by prior echocardiogram 08/2011; 08/2013   Echo 11/09/18: EF 60-65%. Gr 2 DD.  No RWMA.  Mild-Mod AS (mean gradient 19 mmHg). Mild-Mod PAH. ->  Follow-up echo March 2021 showed mean gradient 17 mmHg (dimensionless index 0.32)-this was reported as moderate-severe AS   Myocardial infarction ([place]) 1990s   Had PTCA then PCI on RCA; 11/2011--> Dyspnea and Shoulder pain prior to adm    Non-ST elevation MI (NSTEMI) ([place]) 11/28/2011   Dyspnea and Shoulder pain prior to adm   Type 2 diabetes mellitus with complication, without long-term current use of insulin (HCC)    no longer taking diabetic medication - diet controlled    Past Surgical History   Past Surgical History:  Procedure Laterality Date   ABDOMINAL HYSTERECTOMY     BIOPSY  06/03/2018   Procedure: BIOPSY;  Surgeon: Rogene Houston, MD;  [facility];  Service: Endoscopy;;  gastric    BREAST BIOPSY     CARDIAC CATHETERIZATION  2012   performed for angina   CARDIAC CATHETERIZATION     CARDIOVERSION N/A 10/24/2020   Procedure: CARDIOVERSION;  Surgeon: Jerline Pain, MD;  Location: [facility];  Service: Cardiovascular;  Laterality: N/A;   CORONARY ANGIOPLASTY   11/2006   Cutting Balloon of stent in the RCA   ESOPHAGOGASTRODUODENOSCOPY N/A 06/03/2018   Procedure: ESOPHAGOGASTRODUODENOSCOPY (EGD);  Surgeon: Rogene Houston, MD;  [facility];  Service: Endoscopy;  Laterality: N/A;  1:40   ESOPHAGOGASTRODUODENOSCOPY N/A 12/02/2018   Procedure: ESOPHAGOGASTRODUODENOSCOPY (EGD);  Surgeon: Rogene Houston, MD;  [facility];  Service: Endoscopy;  Laterality: N/A;  255   LEFT HEART CATHETERIZATION WITH CORONARY ANGIOGRAM N/A 11/27/2011   Procedure: LEFT HEART CATHETERIZATION WITH CORONARY ANGIOGRAM;  Surgeon: Leonie Man, MD;  Location: [facility] CATH  LAB;  Service: Cardiovascular;  Laterality: N/A;   PERCUTANEOUS CORONARY STENT INTERVENTION (PCI-S)  1999   Mid RCA - NIR DMS 3.0 mm x 30 mm, dRCA Vision BMS 2.5 mm x 15 mm    TRANSTHORACIC ECHOCARDIOGRAM  10/2018   EF 60-65%. Gr 2 DD.  No RWMA.  Mild-Mod AS  (mean gradient 19 mmHg). Mild-Mod PAH.   TRANSTHORACIC ECHOCARDIOGRAM  03/01/2020   EF 70 to 75%.  No or WMA.  Mild LVH.  GRII DD with severe LA enlargement.  Mildly elevated PAP.  Mild MR.  Moderate AS (mean gradient 17 mmHg) with dimensionless index of 0.32.   TUBAL LIGATION      Past Family History   Family History  Problem Relation Age of Onset   Cancer Mother    Heart disease Father    Heart disease Maternal Grandmother    Heart disease Maternal Grandfather    Heart disease Brother    Nephrolithiasis Brother        accidental death   Heart Problems Brother        CABG    Past Social History   Social History   Socioeconomic History   Marital status: Married    Spouse name: Carloyn Manner   Number of children: 3   Years of education: Not on file   Highest education level: Not on file  Occupational History   Not on file  Tobacco Use   Smoking status: Former   Smokeless tobacco: Never   Tobacco comments:    Quit approximately 1993  Vaping Use   Vaping Use: Never used  Substance and Sexual Activity   Alcohol use: No    Drug use: No   Sexual activity: Not Currently    Birth control/protection: Post-menopausal  Other Topics Concern   Not on file  Social History Narrative   She is a married mother of 64, 2 daughters, 1 son who all live out of state but talks to weekly.   Step-son and his family live next door. Sees often.    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    Quit smoking in '99, denies alcohol.   She volunteers for [employer].   Social Determinants of Health   Financial Resource Strain: Low Risk  (06/18/2022)   Overall Financial Resource Strain (CARDIA)    Difficulty of Paying Living Expenses: Not hard at all  Food Insecurity: No Food Insecurity (06/18/2022)   Hunger Vital Sign    Worried About Running Out of Food in the Last Year: Never true    Ran Out of Food in the Last Year: Never true  Transportation Needs: No Transportation Needs (06/18/2022)   PRAPARE - Hydrologist (Medical): No    Lack of Transportation (Non-Medical): No  Physical Activity: Inactive (06/18/2022)   Exercise Vital Sign    Days of Exercise per Week: 0 days    Minutes of Exercise per Session: 0 min  Stress: No Stress Concern Present (06/18/2022)   [place]    Feeling of Stress : Only a little  Social Connections: Moderately Integrated (06/18/2022)   Social Connection and Isolation Panel [NHANES]    Frequency of Communication with Friends and Family: More than three times a week    Frequency of Social Gatherings with Friends and Family: More than three times a week    Attends Religious Services: 1 to 4 times per year  Active Member of Clubs or Organizations: No    Attends Banker Meetings: Never    Marital Status: Married  Catering manager Violence: Not At Risk (06/18/2022)   Humiliation, Afraid, Rape, and Kick questionnaire    Fear of Current or Ex-Partner: No    Emotionally Abused: No     Physically Abused: No    Sexually Abused: No    Review of Systems   General: Negative for anorexia, weight loss, fever, chills, +fatigue, weakness. Eyes: Negative for vision changes.  ENT: Negative for hoarseness, difficulty swallowing , nasal congestion. CV: Negative for chest pain, angina, palpitations, dyspnea on exertion, peripheral edema.  Respiratory: Negative for dyspnea at rest, dyspnea on exertion, cough, sputum, wheezing.  GI: See history of present illness. GU:  Negative for dysuria, hematuria, urinary frequency, nocturnal urination.  MS: Negative for joint pain, low back pain.  Derm: Negative for rash or itching.  Neuro: Negative for weakness, abnormal sensation, seizure, frequent headaches, memory loss,  confusion.  Psych: Negative for anxiety, depression, suicidal ideation, hallucinations.  Endo: Negative for unusual weight change.  Heme: Negative for bruising or bleeding. Allergy: Negative for rash or hives.  Physical Exam   BP 121/66 (BP Location: Left Arm, Patient Position: Sitting, Cuff Size: Normal)   Pulse 97   Temp 97.7 F (36.5 C) (Temporal)   Ht 5\' 5"  (1.651 m)   Wt 134 lb 3.2 oz (60.9 kg)   SpO2 96%   BMI 22.33 kg/m    General: Well-nourished, well-developed in no acute distress. Slightly pale. Head: Normocephalic, atraumatic.   Eyes: Conjunctiva pale, no icterus. Mouth: Oropharyngeal mucosa moist and pink , no lesions erythema or exudate. Neck: Supple without thyromegaly, masses, or lymphadenopathy.  Lungs: Clear to auscultation bilaterally.  Heart: Regular rate and rhythm, no murmurs rubs or gallops.  Abdomen: Bowel sounds are normal, nondistended, no hepatosplenomegaly or masses,  no abdominal bruits or hernia, no rebound or guarding. Mild epigastric tenderness. Rectal: not performed Extremities: 1+ bilateral pitting lower extremity edema. No clubbing or deformities.  Neuro: Alert and oriented x 4 , grossly normal neurologically.  Skin: Warm  and dry, no rash or jaundice.   Psych: Alert and cooperative, normal mood and affect.  Labs   Lab Results  Component Value Date   CREATININE 0.80 08/06/2022   BUN 17 08/06/2022   NA 147 (H) 08/06/2022   K 4.7 08/06/2022   CL 107 (H) 08/06/2022   CO2 24 08/06/2022   Lab Results  Component Value Date   WBC 5.3 08/20/2022   HGB 9.9 (L) 08/20/2022   HCT 29.1 (L) 08/20/2022   MCV 99 (H) 08/20/2022   PLT 183 08/20/2022   Lab Results  Component Value Date   ALT 14 08/06/2022   AST 18 08/06/2022   ALKPHOS 59 08/06/2022   BILITOT 0.4 08/06/2022   Lab Results  Component Value Date   HGBA1C 5.4 08/06/2022    Imaging Studies   No results found.  Assessment   Melena: recent episode of melena lasting for three days with drop in Hgb in setting of Eliquis. Now with epigastric pain, heartburn/indigestion for several days. Started pantoprazole 2 weeks ago. Remote history of gastric ulcers in setting of BC powders. She needs urgent upper endoscopy. Given history of moderate to severe aortic stenosis, she needs cardiology clearance. She will need to hold Eliquis 48 hours before procedure.    PLAN   Request cardiac clearance ASAP. She may require office visit with them. Increase pantoprazole  to 40mg  BID before meals.  Add carafate 1 gram qac and qhs.  She will present to ED if she develops further melena or worsening abdominal pain.    Laureen Ochs. Bobby Rumpf, [place], [facility]

## 2022-09-02 NOTE — Progress Notes (Signed)
GI Office Note    Referring Provider: Sonny Masters, FNP Primary Care Physician:  Sonny Masters, FNP  Primary Gastroenterologist: Roetta Sessions, MD   Chief Complaint   Chief Complaint  Patient presents with   Abdominal Pain   Belepharitis    Also has a lot of belching. Seen Dorene Ar last for black tarry stools.      History of Present Illness   [name] is a 86 y.o. female presenting today at the request of Gilford Silvius, FNP for black stools and personal history of gastric ulcer.   Patient presented to PCP last month with reports of melena occurring 2-3 days. No Pepto use or iron use. She has history of gastric ulcer in the past. She is on Eliqius for Afib. She saw her PCP. She was noted to have a decline in Hgb. Normal Hgb back in 02/2022. On 08/06/22, Hgb 10.7. On 08/20/22, Hgb 9.9. States she was started on pantoprazole. Stool hemoccult at home, was positive. Held Eliquis for 3 days. Stools cleared up.   Today, she states she started having epigastric pain several days ago. Pain is constant. Worse if moves a certain way. Appetite fair. Pain a little better when she eats. No N/V. Having a lot of heartburn and indigestion. Taking Catering manager. Has been under a lot of stress this past year. Husband had cancer and she was taking care of him at home with help of Hospice. Passed away about a month ago.  She has history of gastric ulcers in 2019. She was using BC powders at that time. Denies any ASA/NSAIDs now.    She is due to see her cardiologist. She has history of CAD s/p stent, Afib, moderate to severe aortic stenosis. She had a recent updated ECHO. She needs to be seen in the structural heart clinic but this was postponed due to her husband's illness/death. She has occasional palpitations. Denies chest pain. She has had in     EGD 11/2018: - Normal esophagus. Healed esophagitis. - Z-line irregular, 37 cm from the incisors. - 2 cm hiatal hernia. - Erythematous  mucosa in the antrum. - Two scars in the gastric antrum indicative of healed ulcers. - Normal duodenal bulb and second portion of the duodenum. - No specimens collected.  EGD 05/2018 - LA Grade A reflux esophagitis. - 3 cm hiatal hernia. - Non-bleeding gastric ulcers. Biopsied. No H.pylori - Erosive gastropathy. - Normal duodenal bulb and second portion of the duodenum.      Medications   Current Outpatient Medications  Medication Sig Dispense Refill   acetaminophen (TYLENOL) 500 MG tablet Take 1,000 mg by mouth 2 (two) times daily as needed (pain.).     apixaban (ELIQUIS) 2.5 MG TABS tablet Take 1 tablet (2.5 mg total) by mouth 2 (two) times daily. 60 tablet 0   atorvastatin (LIPITOR) 20 MG tablet Take 20 mg by mouth at bedtime.      benazepril (LOTENSIN) 40 MG tablet Take 1 tablet (40 mg total) by mouth daily. 90 tablet 3   carvedilol (COREG) 25 MG tablet Take 1 tablet (25 mg total) by mouth 2 (two) times daily. 180 tablet 3   diclofenac Sodium (VOLTAREN) 1 % GEL Apply 2-4 g topically 2 (two) times daily as needed (back/hand pain.).      furosemide (LASIX) 40 MG tablet Take 1 tablet (40 mg total) by mouth daily as needed for fluid. 30 tablet 3   hydrALAZINE (APRESOLINE) 100 MG tablet  TAKE ONE TABLET BY MOUTH TWICE DAILY (Patient taking differently: Take 100 mg by mouth 2 (two) times daily.) 180 tablet 1   NITROSTAT 0.4 MG SL tablet DISSOLVE 1 TAB UNDER TOUNGE FOR CHEST PAIN. MAY REPEAT EVERY 5 MINUTES FOR 3 DOSES. IF NO RELIEF CALL 911 OR GO TO ER (Patient taking differently: Place 0.4 mg under the tongue every 5 (five) minutes as needed for chest pain.) 25 tablet 3   pantoprazole (PROTONIX) 40 MG tablet Take 1 tablet (40 mg total) by mouth daily. 30 tablet 3   sertraline (ZOLOFT) 25 MG tablet TAKE ONE (1) TABLET BY MOUTH EVERY DAY 90 tablet 0   traMADol (ULTRAM) 50 MG tablet Take by mouth every 6 (six) hours as needed.     No current facility-administered medications for this  visit.    Allergies   Allergies as of 09/02/2022 - Review Complete 09/02/2022  Allergen Reaction Noted   Valium Nausea And Vomiting 11/27/2011    Past Medical History   Past Medical History:  Diagnosis Date   Anxiety    CAD S/P percutaneous coronary angioplasty 1990s, 11/28/2011    a) midRCA PCI 1999 (NIR BMS 3.0 mm x 32 mm); distal  RCA BMS Sept 2012 (Vision BMS 2.5 mm x 15 mm); 12/02: RCA ISR --> Cutting PTCA    Coronary stent restenosis due to progression of disease 11/30/2011   Dyslipidemia, goal LDL below 70 11/28/2011   Essential hypertension    On ACE inhibitor and ARB? As well as atenolol and hydralazine   Gastric ulcer 11/2018   GERD (gastroesophageal reflux disease)    History of blood transfusion    without reaction   Moderate aortic stenosis by prior echocardiogram 08/2011; 08/2013   Echo 11/09/18: EF 60-65%. Gr 2 DD.  No RWMA.  Mild-Mod AS (mean gradient 19 mmHg). Mild-Mod PAH. ->  Follow-up echo March 2021 showed mean gradient 17 mmHg (dimensionless index 0.32)-this was reported as moderate-severe AS   Myocardial infarction (HCC) 1990s   Had PTCA then PCI on RCA; 11/2011--> Dyspnea and Shoulder pain prior to adm    Non-ST elevation MI (NSTEMI) (HCC) 11/28/2011   Dyspnea and Shoulder pain prior to adm   Type 2 diabetes mellitus with complication, without long-term current use of insulin (HCC)    no longer taking diabetic medication - diet controlled    Past Surgical History   Past Surgical History:  Procedure Laterality Date   ABDOMINAL HYSTERECTOMY     BIOPSY  06/03/2018   Procedure: BIOPSY;  Surgeon: Rehman, Najeeb U, MD;  Location: [facility];  Service: Endoscopy;;  gastric    BREAST BIOPSY     CARDIAC CATHETERIZATION  2012   performed for angina   CARDIAC CATHETERIZATION     CARDIOVERSION N/A 10/24/2020   Procedure: CARDIOVERSION;  Surgeon: Skains, Mark C, MD;  Location: [facility];  Service: Cardiovascular;  Laterality: N/A;   CORONARY ANGIOPLASTY   11/2006   Cutting Balloon of stent in the RCA   ESOPHAGOGASTRODUODENOSCOPY N/A 06/03/2018   Procedure: ESOPHAGOGASTRODUODENOSCOPY (EGD);  Surgeon: Rehman, Najeeb U, MD;  Location: [facility];  Service: Endoscopy;  Laterality: N/A;  1:40   ESOPHAGOGASTRODUODENOSCOPY N/A 12/02/2018   Procedure: ESOPHAGOGASTRODUODENOSCOPY (EGD);  Surgeon: Rehman, Najeeb U, MD;  Location: [facility];  Service: Endoscopy;  Laterality: N/A;  255   LEFT HEART CATHETERIZATION WITH CORONARY ANGIOGRAM N/A 11/27/2011   Procedure: LEFT HEART CATHETERIZATION WITH CORONARY ANGIOGRAM;  Surgeon: David W Harding, MD;  Location: [facility];  Service: Cardiovascular;  Laterality: N/A;   PERCUTANEOUS CORONARY STENT INTERVENTION (PCI-S)  1999   Mid RCA - NIR DMS 3.0 mm x 30 mm, dRCA Vision BMS 2.5 mm x 15 mm    TRANSTHORACIC ECHOCARDIOGRAM  10/2018   EF 60-65%. Gr 2 DD.  No RWMA.  Mild-Mod AS  (mean gradient 19 mmHg). Mild-Mod PAH.   TRANSTHORACIC ECHOCARDIOGRAM  03/01/2020   EF 70 to 75%.  No or WMA.  Mild LVH.  GRII DD with severe LA enlargement.  Mildly elevated PAP.  Mild MR.  Moderate AS (mean gradient 17 mmHg) with dimensionless index of 0.32.   TUBAL LIGATION      Past Family History   Family History  Problem Relation Age of Onset   Cancer Mother    Heart disease Father    Heart disease Maternal Grandmother    Heart disease Maternal Grandfather    Heart disease Brother    Nephrolithiasis Brother        accidental death   Heart Problems Brother        CABG    Past Social History   Social History   Socioeconomic History   Marital status: Married    Spouse name: Roy   Number of children: 3   Years of education: Not on file   Highest education level: Not on file  Occupational History   Not on file  Tobacco Use   Smoking status: Former   Smokeless tobacco: Never   Tobacco comments:    Quit approximately 1993  Vaping Use   Vaping Use: Never used  Substance and Sexual Activity   Alcohol use: No    Drug use: No   Sexual activity: Not Currently    Birth control/protection: Post-menopausal  Other Topics Concern   Not on file  Social History Narrative   She is a married mother of 3, 2 daughters, 1 son who all live out of state but talks to weekly.   Step-son and his family live next door. Sees often.    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    Quit smoking in '99, denies alcohol.   She volunteers for [employer].   Social Determinants of Health   Financial Resource Strain: Low Risk  (06/18/2022)   Overall Financial Resource Strain (CARDIA)    Difficulty of Paying Living Expenses: Not hard at all  Food Insecurity: No Food Insecurity (06/18/2022)   Hunger Vital Sign    Worried About Running Out of Food in the Last Year: Never true    Ran Out of Food in the Last Year: Never true  Transportation Needs: No Transportation Needs (06/18/2022)   PRAPARE - Transportation    Lack of Transportation (Medical): No    Lack of Transportation (Non-Medical): No  Physical Activity: Inactive (06/18/2022)   Exercise Vital Sign    Days of Exercise per Week: 0 days    Minutes of Exercise per Session: 0 min  Stress: No Stress Concern Present (06/18/2022)   [employer] - Occupational Stress Questionnaire    Feeling of Stress : Only a little  Social Connections: Moderately Integrated (06/18/2022)   Social Connection and Isolation Panel [NHANES]    Frequency of Communication with Friends and Family: More than three times a week    Frequency of Social Gatherings with Friends and Family: More than three times a week    Attends Religious Services: 1 to 4 times per year      Active Member of Clubs or Organizations: No    Attends Banker Meetings: Never    Marital Status: Married  Catering manager Violence: Not At Risk (06/18/2022)   Humiliation, Afraid, Rape, and Kick questionnaire    Fear of Current or Ex-Partner: No    Emotionally Abused: No     Physically Abused: No    Sexually Abused: No    Review of Systems   General: Negative for anorexia, weight loss, fever, chills, +fatigue, weakness. Eyes: Negative for vision changes.  ENT: Negative for hoarseness, difficulty swallowing , nasal congestion. CV: Negative for chest pain, angina, palpitations, dyspnea on exertion, peripheral edema.  Respiratory: Negative for dyspnea at rest, dyspnea on exertion, cough, sputum, wheezing.  GI: See history of present illness. GU:  Negative for dysuria, hematuria, urinary frequency, nocturnal urination.  MS: Negative for joint pain, low back pain.  Derm: Negative for rash or itching.  Neuro: Negative for weakness, abnormal sensation, seizure, frequent headaches, memory loss,  confusion.  Psych: Negative for anxiety, depression, suicidal ideation, hallucinations.  Endo: Negative for unusual weight change.  Heme: Negative for bruising or bleeding. Allergy: Negative for rash or hives.  Physical Exam   BP 121/66 (BP Location: Left Arm, Patient Position: Sitting, Cuff Size: Normal)   Pulse 97   Temp 97.7 F (36.5 C) (Temporal)   Ht 5\' 5"  (1.651 m)   Wt 134 lb 3.2 oz (60.9 kg)   SpO2 96%   BMI 22.33 kg/m    General: Well-nourished, well-developed in no acute distress. Slightly pale. Head: Normocephalic, atraumatic.   Eyes: Conjunctiva pale, no icterus. Mouth: Oropharyngeal mucosa moist and pink , no lesions erythema or exudate. Neck: Supple without thyromegaly, masses, or lymphadenopathy.  Lungs: Clear to auscultation bilaterally.  Heart: Regular rate and rhythm, no murmurs rubs or gallops.  Abdomen: Bowel sounds are normal, nondistended, no hepatosplenomegaly or masses,  no abdominal bruits or hernia, no rebound or guarding. Mild epigastric tenderness. Rectal: not performed Extremities: 1+ bilateral pitting lower extremity edema. No clubbing or deformities.  Neuro: Alert and oriented x 4 , grossly normal neurologically.  Skin: Warm  and dry, no rash or jaundice.   Psych: Alert and cooperative, normal mood and affect.  Labs   Lab Results  Component Value Date   CREATININE 0.80 08/06/2022   BUN 17 08/06/2022   NA 147 (H) 08/06/2022   K 4.7 08/06/2022   CL 107 (H) 08/06/2022   CO2 24 08/06/2022   Lab Results  Component Value Date   WBC 5.3 08/20/2022   HGB 9.9 (L) 08/20/2022   HCT 29.1 (L) 08/20/2022   MCV 99 (H) 08/20/2022   PLT 183 08/20/2022   Lab Results  Component Value Date   ALT 14 08/06/2022   AST 18 08/06/2022   ALKPHOS 59 08/06/2022   BILITOT 0.4 08/06/2022   Lab Results  Component Value Date   HGBA1C 5.4 08/06/2022    Imaging Studies   No results found.  Assessment   Melena: recent episode of melena lasting for three days with drop in Hgb in setting of Eliquis. Now with epigastric pain, heartburn/indigestion for several days. Started pantoprazole 2 weeks ago. Remote history of gastric ulcers in setting of BC powders. She needs urgent upper endoscopy. Given history of moderate to severe aortic stenosis, she needs cardiology clearance. She will need to hold Eliquis 48 hours before procedure.    PLAN   Request cardiac clearance ASAP. She may require office visit with them. Increase pantoprazole  to 40mg BID before meals.  Add carafate 1 gram qac and qhs.  She will present to ED if she develops further melena or worsening abdominal pain.    Axell Trigueros S. Breandan People, MHS, PA-C [facility]  

## 2022-09-02 NOTE — Telephone Encounter (Signed)
  Request for patient to stop medication prior to procedure or is needing cleareance  09/02/22  [name] 09-Jul-1936  What type of surgery is being performed? Esophagogastroduodenoscopy (EGD)  When is surgery scheduled? TBD  What type of clearance is required (medical or pharmacy to hold medication or both? Pharmacy to hold medication  Are there any medications that need to be held prior to surgery and how long? Need to hold Eliquis for 48 hours  Name of physician performing surgery?  [facility] Phone: [phone] Fax: [phone]  Anethesia type (none, local, MAC, general)? MAC

## 2022-09-03 ENCOUNTER — Telehealth: Payer: Self-pay | Admitting: *Deleted

## 2022-09-03 LAB — CBC WITH DIFFERENTIAL/PLATELET
Basophils Absolute: 0 10*3/uL (ref 0.0–0.2)
Basos: 1 %
EOS (ABSOLUTE): 0.1 10*3/uL (ref 0.0–0.4)
Eos: 2 %
Hematocrit: 31.1 % — ABNORMAL LOW (ref 34.0–46.6)
Hemoglobin: 10.1 g/dL — ABNORMAL LOW (ref 11.1–15.9)
Immature Grans (Abs): 0 10*3/uL (ref 0.0–0.1)
Immature Granulocytes: 0 %
Lymphocytes Absolute: 0.9 10*3/uL (ref 0.7–3.1)
Lymphs: 20 %
MCH: 31.9 pg (ref 26.6–33.0)
MCHC: 32.5 g/dL (ref 31.5–35.7)
MCV: 98 fL — ABNORMAL HIGH (ref 79–97)
Monocytes Absolute: 0.3 10*3/uL (ref 0.1–0.9)
Monocytes: 7 %
Neutrophils Absolute: 3.2 10*3/uL (ref 1.4–7.0)
Neutrophils: 70 %
Platelets: 211 10*3/uL (ref 150–450)
RBC: 3.17 x10E6/uL — ABNORMAL LOW (ref 3.77–5.28)
RDW: 12.5 % (ref 11.7–15.4)
WBC: 4.6 10*3/uL (ref 3.4–10.8)

## 2022-09-03 NOTE — Telephone Encounter (Signed)
   Name: [name]  DOB: 27-Jul-1936  MRN: [id number]  Primary Cardiologist: Bryan Lemma, MD   Preoperative team, please contact this patient and set up a phone call appointment for further preoperative risk assessment. Please obtain consent and complete medication review. Thank you for your help.  I confirm that guidance regarding antiplatelet and oral anticoagulation therapy has been completed and, if necessary, noted below.  Patient with diagnosis of afib on Eliquis for anticoagulation.     Procedure: Esophagogastroduodenoscopy (EGD) Date of procedure: TBD     CHA2DS2-VASc Score = 6   This indicates a 9.7% annual risk of stroke. The patient's score is based upon: CHF History: 1 HTN History: 1 Diabetes History: 0 Stroke History: 0 Vascular Disease History: 1 Age Score: 2 Gender Score: 1       CrCl 47 ml/min   Per office protocol, patient can hold Eliquis for 2 days prior to procedure.    Joylene Grapes, NP 09/03/2022, 4:46 PM [facility]

## 2022-09-03 NOTE — Telephone Encounter (Signed)
Pt agreeable to plan of care for tele pre op appt 09/10/22 @ 10 am . Med rec and consent are done.     Patient Consent for Virtual Visit        [name] has provided verbal consent on 09/03/2022 for a virtual visit (video or telephone).   CONSENT FOR VIRTUAL VISIT FOR:  [name]  By participating in this virtual visit I agree to the following:  I hereby voluntarily request, consent and authorize [facility] and its employed or contracted physicians, physician assistants, nurse practitioners or other licensed health care professionals (the Practitioner), to provide me with telemedicine health care services (the "Services") as deemed necessary by the treating Practitioner. I acknowledge and consent to receive the Services by the Practitioner via telemedicine. I understand that the telemedicine visit will involve communicating with the Practitioner through live audiovisual communication technology and the disclosure of certain medical information by electronic transmission. I acknowledge that I have been given the opportunity to request an in-person assessment or other available alternative prior to the telemedicine visit and am voluntarily participating in the telemedicine visit.  I understand that I have the right to withhold or withdraw my consent to the use of telemedicine in the course of my care at any time, without affecting my right to future care or treatment, and that the Practitioner or I may terminate the telemedicine visit at any time. I understand that I have the right to inspect all information obtained and/or recorded in the course of the telemedicine visit and may receive copies of available information for a reasonable fee.  I understand that some of the potential risks of receiving the Services via telemedicine include:  Delay or interruption in medical evaluation due to technological equipment failure or disruption; Information transmitted may not be  sufficient (e.g. poor resolution of images) to allow for appropriate medical decision making by the Practitioner; and/or  In rare instances, security protocols could fail, causing a breach of personal health information.  Furthermore, I acknowledge that it is my responsibility to provide information about my medical history, conditions and care that is complete and accurate to the best of my ability. I acknowledge that Practitioner's advice, recommendations, and/or decision may be based on factors not within their control, such as incomplete or inaccurate data provided by me or distortions of diagnostic images or specimens that may result from electronic transmissions. I understand that the practice of medicine is not an exact science and that Practitioner makes no warranties or guarantees regarding treatment outcomes. I acknowledge that a copy of this consent can be made available to me via my patient portal [facility] MyChart), or I can request a printed copy by calling the office of  [facility].    I understand that my insurance will be billed for this visit.   I have read or had this consent read to me. I understand the contents of this consent, which adequately explains the benefits and risks of the Services being provided via telemedicine.  I have been provided ample opportunity to ask questions regarding this consent and the Services and have had my questions answered to my satisfaction. I give my informed consent for the services to be provided through the use of telemedicine in my medical care

## 2022-09-03 NOTE — Telephone Encounter (Signed)
Patient with diagnosis of afib on Eliquis for anticoagulation.    Procedure: Esophagogastroduodenoscopy (EGD) Date of procedure: TBD   CHA2DS2-VASc Score = 6   This indicates a 9.7% annual risk of stroke. The patient's score is based upon: CHF History: 1 HTN History: 1 Diabetes History: 0 Stroke History: 0 Vascular Disease History: 1 Age Score: 2 Gender Score: 1      CrCl 47 ml/min  Per office protocol, patient can hold Eliquis for 2 days prior to procedure.    **This guidance is not considered finalized until pre-operative APP has relayed final recommendations.**

## 2022-09-03 NOTE — Telephone Encounter (Signed)
Pt agreeable to plan of care for tele pre op appt 09/10/22 @ 10 am . Med rec and consent are done.

## 2022-09-06 ENCOUNTER — Other Ambulatory Visit: Payer: Self-pay | Admitting: Physical Medicine & Rehabilitation

## 2022-09-08 NOTE — Telephone Encounter (Signed)
Per PMP, last fill was 09/06/22 for 8 day supply

## 2022-09-10 ENCOUNTER — Ambulatory Visit: Payer: Medicare Other | Attending: Cardiology | Admitting: Physician Assistant

## 2022-09-10 DIAGNOSIS — Z01810 Encounter for preprocedural cardiovascular examination: Secondary | ICD-10-CM

## 2022-09-10 NOTE — Telephone Encounter (Signed)
Mindy, please schedule patient for EGD asap for melena, acute anemia.   She will need to hold Eliquis 48 hours before.   At time of her OV, I put her as Dr. Gala Romney being primary GI but not sure if she requested him since [place] retired. I see she has an appt with Dr. Jenetta Downer in 10/2022 (New Patient appt).   If [facility] cannot accommodate urgent EGD, maybe Dr. Jenetta Downer or Dr. Abbey Chatters can if patient willing.

## 2022-09-10 NOTE — Telephone Encounter (Signed)
Called pt, no answer and not able to leave VM. Line rang numerous times

## 2022-09-10 NOTE — Telephone Encounter (Signed)
Pt had pre-op clearance appt. Notes in epic. Please advise Dr. Gala Romney thanks!

## 2022-09-10 NOTE — Progress Notes (Signed)
Virtual Visit via Telephone Note   Because of [name]'s co-morbid illnesses, she is at least at moderate risk for complications without adequate follow up.  This format is felt to be most appropriate for this patient at this time.  The patient did not have access to video technology/had technical difficulties with video requiring transitioning to audio format only (telephone).  All issues noted in this document were discussed and addressed.  No physical exam could be performed with this format.  Please refer to the patient's chart for her consent to telehealth for [facility].  Evaluation Performed:  Preoperative cardiovascular risk assessment _____________   Date:  09/10/2022   Patient ID:  [name], DOB 1936/02/10, MRN [id number] Patient Location:  Home Provider location:   Office  Primary Care Provider:  Baruch Gouty, FNP Primary Cardiologist:  Glenetta Hew, MD  Chief Complaint / Patient Profile   86 y.o. y/o female with a h/o CAD s/p prior PCIs (1999, 2012, 2002 per note), persistent atrial fib, chronic diastolic CHF, progressive aortic stenosis, HTN, HLD who is pending EGD and presents today for telephonic preoperative cardiovascular risk assessment.  Past Medical History    Past Medical History:  Diagnosis Date   A-fib [facility])    Anxiety    CAD S/P percutaneous coronary angioplasty 1990s, 11/28/2011    a) midRCA PCI 1999 (NIR BMS 3.0 mm x 32 mm); distal  RCA BMS Sept 2012 (Vision BMS 2.5 mm x 15 mm); 12/02: RCA ISR --> Cutting PTCA    Coronary stent restenosis due to progression of disease 11/30/2011   Dyslipidemia, goal LDL below 70 11/28/2011   Essential hypertension    On ACE inhibitor and ARB? As well as atenolol and hydralazine   Gastric ulcer 11/2018   GERD (gastroesophageal reflux disease)    History of blood transfusion    without reaction   Moderate aortic stenosis by prior echocardiogram 08/2011; 08/2013   Echo 11/09/18: EF 60-65%.  Gr 2 DD.  No RWMA.  Mild-Mod AS (mean gradient 19 mmHg). Mild-Mod PAH. ->  Follow-up echo March 2021 showed mean gradient 17 mmHg (dimensionless index 0.32)-this was reported as moderate-severe AS   Myocardial infarction ([place]) 1990s   Had PTCA then PCI on RCA; 11/2011--> Dyspnea and Shoulder pain prior to adm    Non-ST elevation MI (NSTEMI) ([place]) 11/28/2011   Dyspnea and Shoulder pain prior to adm   Type 2 diabetes mellitus with complication, without long-term current use of insulin ([place])    no longer taking diabetic medication - diet controlled   Past Surgical History:  Procedure Laterality Date   ABDOMINAL HYSTERECTOMY     BIOPSY  06/03/2018   Procedure: BIOPSY;  Surgeon: Rogene Houston, MD;  [facility];  Service: Endoscopy;;  gastric    BREAST BIOPSY     CARDIAC CATHETERIZATION  2012   performed for angina   CARDIAC CATHETERIZATION     CARDIOVERSION N/A 10/24/2020   Procedure: CARDIOVERSION;  Surgeon: Jerline Pain, MD;  Location: [facility];  Service: Cardiovascular;  Laterality: N/A;   CORONARY ANGIOPLASTY  11/2006   Cutting Balloon of stent in the RCA   ESOPHAGOGASTRODUODENOSCOPY N/A 06/03/2018   Procedure: ESOPHAGOGASTRODUODENOSCOPY (EGD);  Surgeon: Rogene Houston, MD;  [facility];  Service: Endoscopy;  Laterality: N/A;  1:40   ESOPHAGOGASTRODUODENOSCOPY N/A 12/02/2018   Procedure: ESOPHAGOGASTRODUODENOSCOPY (EGD);  Surgeon: Rogene Houston, MD;  [facility];  Service: Endoscopy;  Laterality:  N/A;  255   LEFT HEART CATHETERIZATION WITH CORONARY ANGIOGRAM N/A 11/27/2011   Procedure: LEFT HEART CATHETERIZATION WITH CORONARY ANGIOGRAM;  Surgeon: Marykay Lex, MD;  [facility] C[facility] LAB;  Service: Cardiovascular;  Laterality: N/A;   PERCUTANEOUS CORONARY STENT INTERVENTION (PCI-S)  1999   Mid RCA - NIR DMS 3.0 mm x 30 mm, dRCA Vision BMS 2.5 mm x 15 mm    TRANSTHORACIC ECHOCARDIOGRAM  10/2018   EF 60-65%. Gr 2 DD.  No RWMA.  Mild-Mod AS   (mean gradient 19 mmHg). Mild-Mod PAH.   TRANSTHORACIC ECHOCARDIOGRAM  03/01/2020   EF 70 to 75%.  No or WMA.  Mild LVH.  GRII DD with severe LA enlargement.  Mildly elevated PAP.  Mild MR.  Moderate AS (mean gradient 17 mmHg) with dimensionless index of 0.32.   TUBAL LIGATION      Allergies  Allergies  Allergen Reactions   Valium Nausea And Vomiting    History of Present Illness    [name] is a 86 y.o. female who presents via audio/video conferencing for a telehealth visit today.  Pt was last seen in cardiology clinic 10/2022 by Juanda Crumble PA-C.  At that time BIANEY TESORO was doing relatively well.  The patient is now pending procedure as outlined above. F/u echo 06/20/22 showed EF 65-70% with severe BAE, mild MR, moderate TR and concern for progressive low flow, low gradient severe AS. Referral to structural was initiated but patient initially deferred as her spouse was placed on hospice. They have since passed away so appointment for this pending. Since her last visit, she denies any new symptoms. She is limited in activity due to chronic back pain so does not do much but reports that what she does do, she does not have any new CP, SOB, or palpitations.   Home Medications    Prior to Admission medications   Medication Sig Start Date End Date Taking? Authorizing Provider  acetaminophen (TYLENOL) 500 MG tablet Take 1,000 mg by mouth 2 (two) times daily as needed (pain.).    [provider]  apixaban (ELIQUIS) 2.5 MG TABS tablet Take 1 tablet (2.5 mg total) by mouth 2 (two) times daily. 09/01/20 10/17/22  Jacalyn Lefevre, MD  atorvastatin (LIPITOR) 20 MG tablet Take 20 mg by mouth at bedtime.     [provider]  benazepril (LOTENSIN) 40 MG tablet Take 1 tablet (40 mg total) by mouth daily. Patient taking differently: Take 20 mg by mouth 2 (two) times daily. 05/01/22   Sonny Masters, FNP  carvedilol (COREG) 25 MG tablet Take 1 tablet (25 mg total) by  mouth 2 (two) times daily. 09/10/20   Azalee Course, PA  diclofenac Sodium (VOLTAREN) 1 % GEL Apply 2-4 g topically 2 (two) times daily as needed (back/hand pain.).  07/25/19   [provider]  furosemide (LASIX) 40 MG tablet Take 1 tablet (40 mg total) by mouth daily as needed for fluid. 08/06/22   Sonny Masters, FNP  hydrALAZINE (APRESOLINE) 100 MG tablet TAKE ONE TABLET BY MOUTH TWICE DAILY Patient taking differently: Take 100 mg by mouth 2 (two) times daily. 11/09/17   Marykay Lex, MD  NITROSTAT 0.4 MG SL tablet DISSOLVE 1 TAB UNDER TOUNGE FOR CHEST PAIN. MAY REPEAT EVERY 5 MINUTES FOR 3 DOSES. IF NO RELIEF CALL 911 OR GO TO ER Patient not taking: Reported on 09/03/2022 11/24/16   Marykay Lex, MD  pantoprazole (PROTONIX) 40 MG tablet Take 1  tablet (40 mg total) by mouth 2 (two) times daily before a meal. 09/02/22   Tiffany Kocher, PA-C  sertraline (ZOLOFT) 25 MG tablet TAKE ONE (1) TABLET BY MOUTH EVERY DAY 06/25/22   Rakes, Doralee Albino, FNP  sucralfate (CARAFATE) 1 GM/10ML suspension Take 10 mLs (1 g total) by mouth 4 (four) times daily -  with meals and at bedtime. 09/02/22   Tiffany Kocher, PA-C  traMADol (ULTRAM) 50 MG tablet TAKE 1 TABLET BY MOUTH EVERY 6 HOURS AS NEEDED FOR PAIN 09/08/22   Kirsteins, Victorino Sparrow, MD    Physical Exam    Vital Signs:  [name] does not have vital signs available for review today.  Given telephonic nature of communication, physical exam is limited. AAOx3. NAD. Normal affect.  Speech and respirations are unlabored.  Accessory Clinical Findings    None  Assessment & Plan    1.  Preoperative Cardiovascular Risk Assessment: given known severe AS, patient is at higher risk for procedure. I did review with Dr. Herbie Baltimore who felt that there was not anything presently urgently that we could do to decrease this risk and felt that proceeding with endoscopy with moderate sedation would be OK. The patient was advised that if she develops new symptoms  prior to surgery to contact our office to arrange for a follow-up visit, and she verbalized understanding. I will also route this message to the structural heart PA Carlean Jews to let her know to reach back out to patient to schedule OV, looks like left a VM last week but patient did not call back yet.  Per pharmD, Per office protocol, patient can hold Eliquis for 2 days prior to procedure. I did review Eliquis dosing with Dr. Herbie Baltimore since her weight is now 60.9kg by last GI visit, typically runs <60kg. Per Dr. Herbie Baltimore, keep dosing at 2.5mg  BID given historical weight and recent GIB, but continue to review at follow-up.  A copy of this note will be routed to requesting surgeon.  Time:   Today, I have spent 5 minutes with the patient with telehealth technology discussing medical history, symptoms, and management plan.     Laurann Montana, PA-C  09/10/2022, 10:05 AM

## 2022-09-11 NOTE — Telephone Encounter (Signed)
Spoke with pt. She has already taken her eliquis. She is scheduled with Dr. Abbey Chatters on 9/25 at [facility] will call back with pre-op appt. Advised need to hold eliquis starting 9/23.

## 2022-09-11 NOTE — Telephone Encounter (Signed)
Spoke with pt and she is aware of pre-op appt details. Also discussed egd instructions in detail with pt

## 2022-09-11 NOTE — Telephone Encounter (Signed)
PA approved via [facility]. Auth# [id number], DOS: Sep 15, 2022 - Dec 14, 2022

## 2022-09-12 ENCOUNTER — Encounter (HOSPITAL_COMMUNITY)
Admission: RE | Admit: 2022-09-12 | Discharge: 2022-09-12 | Disposition: A | Discharge status: Home / Self Care | Payer: Medicare Other | Source: Ambulatory Visit | Attending: Internal Medicine | Admitting: Internal Medicine

## 2022-09-12 ENCOUNTER — Other Ambulatory Visit: Payer: Self-pay | Admitting: Family Medicine

## 2022-09-12 NOTE — Patient Instructions (Signed)
20    Your procedure is scheduled on: 09/15/2022  Report to [place] Entrance at   11:00  AM.  Call this number if you have problems the morning of surgery: [phone]   Remember:   Follow instructions on letter from office regarding when to stop eating and drinking        No Smoking the day of procedure      Take these medicines the morning of surgery with A SIP OF WATER: Tramadol, zoloft, pantoprazole, hydralazine and carvedilol   Do not wear jewelry, make-up or nail polish.  Do not wear lotions, powders, or perfumes. You may wear deodorant.                Do not bring valuables to the hospital.  Contacts, dentures or bridgework may not be worn into surgery.  Leave suitcase in the car. After surgery it may be brought to your room.  For patients admitted to the hospital, checkout time is 11:00 AM the day of discharge.   Patients discharged the day of surgery will not be allowed to drive home. Upper Endoscopy, Adult Upper endoscopy is a procedure to look inside the upper GI (gastrointestinal) tract. The upper GI tract is made up of: The part of the body that moves food from your mouth to your stomach (esophagus). The stomach. The first part of your small intestine (duodenum). This procedure is also called esophagogastroduodenoscopy (EGD) or gastroscopy. In this procedure, your health care provider passes a thin, flexible tube (endoscope) through your mouth and down your esophagus into your stomach. A small camera is attached to the end of the tube. Images from the camera appear on a monitor in the exam room. During this procedure, your health care provider may also remove a small piece of tissue to be sent to a lab and examined under a microscope (biopsy). Your health care provider may do an upper endoscopy to diagnose cancers of the upper GI tract. You may also have this procedure to find the cause of other conditions, such as: Stomach pain. Heartburn. Pain or problems when  swallowing. Nausea and vomiting. Stomach bleeding. Stomach ulcers. Tell a health care provider about: Any allergies you have. All medicines you are taking, including vitamins, herbs, eye drops, creams, and over-the-counter medicines. Any problems you or family members have had with anesthetic medicines. Any blood disorders you have. Any surgeries you have had. Any medical conditions you have. Whether you are pregnant or may be pregnant. What are the risks? Generally, this is a safe procedure. However, problems may occur, including: Infection. Bleeding. Allergic reactions to medicines. A tear or hole (perforation) in the esophagus, stomach, or duodenum. What happens before the procedure? Staying hydrated Follow instructions from your health care provider about hydration, which may include: Up to 4 hours before the procedure - you may continue to drink clear liquids, such as water, clear fruit juice, black coffee, and plain tea.   Medicines Ask your health care provider about: Changing or stopping your regular medicines. This is especially important if you are taking diabetes medicines or blood thinners. Taking medicines such as aspirin and ibuprofen. These medicines can thin your blood. Do not take these medicines unless your health care provider tells you to take them. Taking over-the-counter medicines, vitamins, herbs, and supplements. General instructions Plan to have someone take you home from the hospital or clinic. If you will be going home right after the procedure, plan to have someone with you for 24  hours. Ask your health care provider what steps will be taken to help prevent infection. What happens during the procedure?  An IV will be inserted into one of your veins. You may be given one or more of the following: A medicine to help you relax (sedative). A medicine to numb the throat (local anesthetic). You will lie on your left side on an exam table. Your health care  provider will pass the endoscope through your mouth and down your esophagus. Your health care provider will use the scope to check the inside of your esophagus, stomach, and duodenum. Biopsies may be taken. The endoscope will be removed. The procedure may vary among health care providers and hospitals. What happens after the procedure? Your blood pressure, heart rate, breathing rate, and blood oxygen level will be monitored until you leave the hospital or clinic. Do not drive for 24 hours if you were given a sedative during your procedure. When your throat is no longer numb, you may be given some fluids to drink. It is up to you to get the results of your procedure. Ask your health care provider, or the department that is doing the procedure, when your results will be ready. Summary Upper endoscopy is a procedure to look inside the upper GI tract. During the procedure, an IV will be inserted into one of your veins. You may be given a medicine to help you relax. A medicine will be used to numb your throat. The endoscope will be passed through your mouth and down your esophagus. This information is not intended to replace advice given to you by your health care provider. Make sure you discuss any questions you have with your health care provider. Document Revised: 06/02/2018 Document Reviewed: 05/10/2018 Elsevier Patient Education  [place] After  Please read the instructions outlined below and refer to this sheet in the next few weeks. These discharge instructions provide you with general information on caring for yourself after you leave the hospital. Your doctor may also give you specific instructions. While your treatment has been planned according to the most current medical practices available, unavoidable complications occasionally  occur. If you have any problems or questions after discharge, please call your doctor. HOME CARE INSTRUCTIONS Activity You may resume your regular activity but move at a slower pace for the next 24 hours.  Take frequent rest periods for the next 24 hours.  Walking will help expel (get rid of) the air and reduce the bloated feeling in your abdomen.  No driving for 24 hours (because of the anesthesia (medicine) used during the test).  You may shower.  Do not sign any important legal documents or operate any machinery for 24 hours (because of  the anesthesia used during the test).  Nutrition Drink plenty of fluids.  You may resume your normal diet.  Begin with a light meal and progress to your normal diet.  Avoid alcoholic beverages for 24 hours or as instructed by your caregiver.  Medications You may resume your normal medications unless your caregiver tells you otherwise. What you can expect today You may experience abdominal discomfort such as a feeling of fullness or "gas" pains.  You may experience a sore throat for 2 to 3 days. This is normal. Gargling with salt water may help this.  Follow-up Your doctor will discuss the results of your test with you. SEEK IMMEDIATE MEDICAL CARE IF: You have excessive nausea (feeling sick to your stomach) and/or vomiting.  You have severe abdominal pain and distention (swelling).  You have trouble swallowing.  You have a temperature over 100 F (37.8 C).  You have rectal bleeding or vomiting of blood.  Document Released: 07/22/2004 Document Revised: 11/27/2011 Document Reviewed: 02/02/2008

## 2022-09-15 ENCOUNTER — Ambulatory Visit (HOSPITAL_COMMUNITY): Payer: Medicare Other | Admitting: Anesthesiology

## 2022-09-15 ENCOUNTER — Ambulatory Visit (HOSPITAL_COMMUNITY)
Admission: RE | Admit: 2022-09-15 | Discharge: 2022-09-15 | Disposition: A | Discharge status: Home / Self Care | Payer: Medicare Other | Attending: Internal Medicine | Admitting: Internal Medicine

## 2022-09-15 ENCOUNTER — Encounter (HOSPITAL_COMMUNITY)
Admission: RE | Disposition: A | Discharge status: Home / Self Care | Payer: Self-pay | Source: Home / Self Care | Attending: Internal Medicine

## 2022-09-15 ENCOUNTER — Ambulatory Visit (HOSPITAL_BASED_OUTPATIENT_CLINIC_OR_DEPARTMENT_OTHER): Payer: Medicare Other | Admitting: Anesthesiology

## 2022-09-15 ENCOUNTER — Encounter (HOSPITAL_COMMUNITY): Payer: Self-pay

## 2022-09-15 DIAGNOSIS — Z8711 Personal history of peptic ulcer disease: Secondary | ICD-10-CM | POA: Diagnosis not present

## 2022-09-15 DIAGNOSIS — K319 Disease of stomach and duodenum, unspecified: Secondary | ICD-10-CM | POA: Insufficient documentation

## 2022-09-15 DIAGNOSIS — Z79899 Other long term (current) drug therapy: Secondary | ICD-10-CM | POA: Diagnosis not present

## 2022-09-15 DIAGNOSIS — I4891 Unspecified atrial fibrillation: Secondary | ICD-10-CM | POA: Insufficient documentation

## 2022-09-15 DIAGNOSIS — Z955 Presence of coronary angioplasty implant and graft: Secondary | ICD-10-CM | POA: Insufficient documentation

## 2022-09-15 DIAGNOSIS — I10 Essential (primary) hypertension: Secondary | ICD-10-CM | POA: Diagnosis not present

## 2022-09-15 DIAGNOSIS — R1013 Epigastric pain: Secondary | ICD-10-CM | POA: Diagnosis present

## 2022-09-15 DIAGNOSIS — K30 Functional dyspepsia: Secondary | ICD-10-CM | POA: Insufficient documentation

## 2022-09-15 DIAGNOSIS — E119 Type 2 diabetes mellitus without complications: Secondary | ICD-10-CM | POA: Insufficient documentation

## 2022-09-15 DIAGNOSIS — K2100 Gastro-esophageal reflux disease with esophagitis, without bleeding: Secondary | ICD-10-CM | POA: Insufficient documentation

## 2022-09-15 DIAGNOSIS — Z87891 Personal history of nicotine dependence: Secondary | ICD-10-CM

## 2022-09-15 DIAGNOSIS — K2970 Gastritis, unspecified, without bleeding: Secondary | ICD-10-CM

## 2022-09-15 DIAGNOSIS — I350 Nonrheumatic aortic (valve) stenosis: Secondary | ICD-10-CM | POA: Diagnosis not present

## 2022-09-15 DIAGNOSIS — I2510 Atherosclerotic heart disease of native coronary artery without angina pectoris: Secondary | ICD-10-CM | POA: Insufficient documentation

## 2022-09-15 DIAGNOSIS — K449 Diaphragmatic hernia without obstruction or gangrene: Secondary | ICD-10-CM | POA: Diagnosis not present

## 2022-09-15 DIAGNOSIS — Z7901 Long term (current) use of anticoagulants: Secondary | ICD-10-CM | POA: Insufficient documentation

## 2022-09-15 HISTORY — PX: BIOPSY: SHX5522

## 2022-09-15 HISTORY — PX: ESOPHAGOGASTRODUODENOSCOPY (EGD) WITH PROPOFOL: SHX5813

## 2022-09-15 SURGERY — ESOPHAGOGASTRODUODENOSCOPY (EGD) WITH PROPOFOL
Anesthesia: General

## 2022-09-15 MED ORDER — LIDOCAINE HCL (CARDIAC) PF 100 MG/5ML IV SOSY
PREFILLED_SYRINGE | INTRAVENOUS | Status: DC | PRN
  Administered 2022-09-15: 60 mg via INTRAVENOUS

## 2022-09-15 MED ORDER — PROPOFOL 10 MG/ML IV BOLUS
INTRAVENOUS | Status: DC | PRN
  Administered 2022-09-15: 20 mg via INTRAVENOUS
  Administered 2022-09-15: 50 mg via INTRAVENOUS
  Administered 2022-09-15: 30 mg via INTRAVENOUS

## 2022-09-15 MED ORDER — PROPOFOL 10 MG/ML IV BOLUS
INTRAVENOUS | Status: AC
  Filled 2022-09-15: qty 20

## 2022-09-15 MED ORDER — LACTATED RINGERS IV SOLN
INTRAVENOUS | Status: DC
  Administered 2022-09-15: 1000 mL via INTRAVENOUS

## 2022-09-15 NOTE — Anesthesia Preprocedure Evaluation (Signed)
Anesthesia Evaluation  Patient identified by MRN, date of birth, ID band Patient awake    Reviewed: Allergy & Precautions, H&P , NPO status , Patient's Chart, lab work & pertinent test results, reviewed documented beta blocker date and time   Airway Mallampati: II  TM Distance: >3 FB Neck ROM: full    Dental no notable dental hx.    Pulmonary neg pulmonary ROS, former smoker,    Pulmonary exam normal breath sounds clear to auscultation       Cardiovascular Exercise Tolerance: Good hypertension, + CAD and + Cardiac Stents  + Valvular Problems/Murmurs AS  Rhythm:regular Rate:Normal     Neuro/Psych PSYCHIATRIC DISORDERS Anxiety negative neurological ROS     GI/Hepatic Neg liver ROS, PUD, GERD  Medicated,  Endo/Other  negative endocrine ROSdiabetes, Type 2  Renal/GU negative Renal ROS  negative genitourinary   Musculoskeletal   Abdominal   Peds  Hematology  (+) Blood dyscrasia, anemia ,   Anesthesia Other Findings Moderate-Severe AS   Reproductive/Obstetrics negative OB ROS                             Anesthesia Physical Anesthesia Plan  ASA: 4 and emergent  Anesthesia Plan: General   Post-op Pain Management:    Induction:   PONV Risk Score and Plan: Propofol infusion  Airway Management Planned:   Additional Equipment:   Intra-op Plan:   Post-operative Plan:   Informed Consent: I have reviewed the patients History and Physical, chart, labs and discussed the procedure including the risks, benefits and alternatives for the proposed anesthesia with the patient or authorized representative who has indicated his/her understanding and acceptance.     Dental Advisory Given  Plan Discussed with: CRNA  Anesthesia Plan Comments:         Anesthesia Quick Evaluation

## 2022-09-15 NOTE — Anesthesia Postprocedure Evaluation (Signed)
Anesthesia Post Note  Patient: [name]  Procedure(s) Performed: ESOPHAGOGASTRODUODENOSCOPY (EGD) WITH PROPOFOL BIOPSY  Patient location during evaluation: Short Stay Anesthesia Type: General Level of consciousness: awake and alert Pain management: pain level controlled Vital Signs Assessment: post-procedure vital signs reviewed and stable Respiratory status: spontaneous breathing Cardiovascular status: blood pressure returned to baseline and stable Postop Assessment: no apparent nausea or vomiting Anesthetic complications: no   No notable events documented.   Last Vitals:  Vitals:   09/15/22 1136 09/15/22 1227  BP: (!) 188/87 (!) 119/55  Pulse: 85 78  Resp: 16 16  Temp: 36.5 C 36.6 C  SpO2: 100% 98%    Last Pain:  Vitals:   09/15/22 1227  TempSrc: Oral  PainSc: 0-No pain                 Bryan Omura

## 2022-09-15 NOTE — Op Note (Signed)
[facility] Patient Name: [name] Procedure Date: 09/15/2022 11:44 AM MRN: [id number] Date of Birth: [birth year] Attending MD: Elon Alas. Abbey Chatters DO CSN: [id number] Age: 86 Admit Type: Outpatient Procedure:                Upper GI endoscopy Indications:              Epigastric abdominal pain Providers:                Elon Alas. Abbey Chatters, DO, Caprice Kluver, Ladoris Gene                            Technician, Technician Referring MD:             Elon Alas. Abbey Chatters, DO Medicines:                See the Anesthesia note for documentation of the                            administered medications Complications:            No immediate complications. Estimated Blood Loss:     Estimated blood loss was minimal. Procedure:                Pre-Anesthesia Assessment:                           - The anesthesia plan was to use monitored                            anesthesia care (MAC).                           After obtaining informed consent, the endoscope was                            passed under direct vision. Throughout the                            procedure, the patient's blood pressure, pulse, and                            oxygen saturations were monitored continuously. The                            [id number]-H190 ([id number]) scope was introduced through the                            mouth, and advanced to the second part of duodenum.                            The upper GI endoscopy was accomplished without                            difficulty. The patient tolerated the procedure                            well. Scope  In: 12:14:18 PM Scope Out: 25:42:70 PM Total Procedure Duration: 0 hours 4 minutes 1 second  Findings:      The Z-line was regular and was found 39 cm from the incisors.      There is no endoscopic evidence of bleeding, areas of erosion,       esophagitis, ulcerations or varices in the entire esophagus.      Patchy mild inflammation characterized by erythema was found  in the       gastric body and in the gastric antrum. Biopsies were taken with a cold       forceps for Helicobacter pylori testing.      The duodenal bulb, first portion of the duodenum and second portion of       the duodenum were normal. Impression:               - Z-line regular, 39 cm from the incisors.                           - Gastritis. Biopsied.                           - Normal duodenal bulb, first portion of the                            duodenum and second portion of the duodenum. Moderate Sedation:      Per Anesthesia Care Recommendation:           - Patient has a contact number available for                            emergencies. The signs and symptoms of potential                            delayed complications were discussed with the                            patient. Return to normal activities tomorrow.                            Written discharge instructions were provided to the                            patient.                           - Resume previous diet.                           - Continue present medications.                           - Await pathology results.                           - Continue on pantoprazole                           - Consider capsule endoscopy to further  evaluate                            anemia if Hgb <10 Procedure Code(s):        --- Professional ---                           [phone], Esophagogastroduodenoscopy, flexible,                            transoral; with biopsy, single or multiple Diagnosis Code(s):        --- Professional ---                           K29.70, Gastritis, unspecified, without bleeding                           R10.13, Epigastric pain CPT copyright 2019 [employer] Medical Association. All rights reserved. The codes documented in this report are preliminary and upon coder review may  be revised to meet current compliance requirements. Elon Alas. Abbey Chatters, DO [place] Abbey Chatters, DO 09/15/2022 12:22:30  PM This report has been signed electronically. Number of Addenda: 0

## 2022-09-15 NOTE — Discharge Instructions (Addendum)
EGD Discharge instructions Please read the instructions outlined below and refer to this sheet in the next few weeks. These discharge instructions provide you with general information on caring for yourself after you leave the hospital. Your doctor may also give you specific instructions. While your treatment has been planned according to the most current medical practices available, unavoidable complications occasionally occur. If you have any problems or questions after discharge, please call your doctor. ACTIVITY You may resume your regular activity but move at a slower pace for the next 24 hours.  Take frequent rest periods for the next 24 hours.  Walking will help expel (get rid of) the air and reduce the bloated feeling in your abdomen.  No driving for 24 hours (because of the anesthesia (medicine) used during the test).  You may shower.  Do not sign any important legal documents or operate any machinery for 24 hours (because of the anesthesia used during the test).  NUTRITION Drink plenty of fluids.  You may resume your normal diet.  Begin with a light meal and progress to your normal diet.  Avoid alcoholic beverages for 24 hours or as instructed by your caregiver.  MEDICATIONS You may resume your normal medications unless your caregiver tells you otherwise.  WHAT YOU CAN EXPECT TODAY You may experience abdominal discomfort such as a feeling of fullness or "gas" pains.  FOLLOW-UP Your doctor will discuss the results of your test with you.  SEEK IMMEDIATE MEDICAL ATTENTION IF ANY OF THE FOLLOWING OCCUR: Excessive nausea (feeling sick to your stomach) and/or vomiting.  Severe abdominal pain and distention (swelling).  Trouble swallowing.  Temperature over 101 F (37.8 C).  Rectal bleeding or vomiting of blood.   Your EGD revealed mild amount inflammation in your stomach.  I took biopsies of this to rule out infection with a bacteria called H. pylori.  Await pathology results, my  office will contact you.  Your esophagus and small bowel both appeared normal.  Continue on pantoprazole twice daily.  We may need to consider capsule endoscopy to further evaluate your anemia and dark stools.  Follow-up with GI in 2 to 3 months.   I hope you have a great rest of your week!  Elon Alas. Abbey Chatters, D.O. Gastroenterology and Hepatology [facility]

## 2022-09-15 NOTE — Interval H&P Note (Signed)
History and Physical Interval Note:  09/15/2022 11:05 AM  [name]  has presented today for surgery, with the diagnosis of MELENA, ACUTE ANEMIA.  The various methods of treatment have been discussed with the patient and family. After consideration of risks, benefits and other options for treatment, the patient has consented to  Procedure(s) with comments: ESOPHAGOGASTRODUODENOSCOPY (EGD) WITH PROPOFOL (N/A) - 1:00pm, asa 3 as a surgical intervention.  The patient's history has been reviewed, patient examined, no change in status, stable for surgery.  I have reviewed the patient's chart and labs.  Questions were answered to the patient's satisfaction.     Eloise Harman

## 2022-09-15 NOTE — Transfer of Care (Signed)
Immediate Anesthesia Transfer of Care Note  Patient: [name]  Procedure(s) Performed: ESOPHAGOGASTRODUODENOSCOPY (EGD) WITH PROPOFOL BIOPSY  Patient Location: Short Stay  Anesthesia Type:General  Level of Consciousness: awake  Airway & Oxygen Therapy: Patient Spontanous Breathing  Post-op Assessment: Report given to RN  Post vital signs: Reviewed and stable  Last Vitals:  Vitals Value Taken Time  BP 119/55 09/15/22 1227  Temp 36.6 C 09/15/22 1227  Pulse 78 09/15/22 1227  Resp 16 09/15/22 1227  SpO2 98 % 09/15/22 1227    Last Pain:  Vitals:   09/15/22 1227  TempSrc: Oral  PainSc: 0-No pain      Patients Stated Pain Goal: 8 (32/02/33 4356)  Complications: No notable events documented.

## 2022-09-16 ENCOUNTER — Other Ambulatory Visit: Payer: Self-pay | Admitting: Gastroenterology

## 2022-09-16 ENCOUNTER — Other Ambulatory Visit: Payer: Self-pay | Admitting: Family Medicine

## 2022-09-16 LAB — SURGICAL PATHOLOGY

## 2022-09-23 ENCOUNTER — Encounter (HOSPITAL_COMMUNITY): Payer: Self-pay | Admitting: Internal Medicine

## 2022-10-06 ENCOUNTER — Encounter: Payer: Self-pay | Admitting: Cardiovascular Disease

## 2022-10-06 ENCOUNTER — Ambulatory Visit: Payer: Medicare Other | Attending: Cardiovascular Disease | Admitting: Cardiovascular Disease

## 2022-10-06 VITALS — BP 176/76 | HR 78 | Ht 65.0 in | Wt 123.8 lb

## 2022-10-06 DIAGNOSIS — I350 Nonrheumatic aortic (valve) stenosis: Secondary | ICD-10-CM

## 2022-10-06 NOTE — Progress Notes (Signed)
Pre Surgical Assessment: 5 M Walk Test  39M=16.70ft  5 Meter Walk Test- trial 1: 6.66 seconds 5 Meter Walk Test- trial 2: 6.33 seconds 5 Meter Walk Test- trial 3: 5.81 seconds 5 Meter Walk Test Average: 6.26 seconds

## 2022-10-06 NOTE — Patient Instructions (Addendum)
Medication Instructions:  No changes *If you need a refill on your cardiac medications before your next appointment, please call your pharmacy*   Lab Work: None today If you have labs (blood work) drawn today and your tests are completely normal, you will receive your results only by: [place] (if you have MyChart) OR A paper copy in the mail If you have any lab test that is abnormal or we need to change your treatment, we will call you to review the results.   Testing/Procedures: None today   Follow-Up:  Other Instructions Please reach out to our Structural Heart Team when you are ready to move forward.

## 2022-10-06 NOTE — Progress Notes (Signed)
Structural Heart Clinic Consult Note  Chief Complaint  Patient presents with   New Patient (Initial Visit)    Aortic stenosis   History of Present Illness: 86 yo female with history of persistent atrial fibrillation, CAD with prior PCI, chronic diastolic CHF, HTN, HLD, DM diet controlled and severe aortic stenosis who is here today as a new consult, referred by Dr. Herbie Baltimore, for further discussion regarding her aortic stenosis and possible TAVR. She has CAD and had a bare metal stent placed in the RCA in 1999 and another distal bare metal stent placed in the distal RCA in 2012. Last cardiac cath in 2012 with severe in stent restenosis in distal RCA stent treated with cutting balloon angioplasty. She has been followed over the past year for severe aortic stenosis. Echo December 2022 with normal LV function, mild MR and severe low flow/low gradient AS with mean gradient 27 mmHg, AVA 0.8 cm2. She was called at that time and referral to the structural heart clinic was made but she had other family issues and did not schedule this appt. Most recent echo 06/20/22 with LVEF=65-70%, mild MR. Severe low flow/low gradient AS with mean gradient 27 mmHg, AVA 0.72 cm2, DI 0.23, SVI 34. She had a recent upper endoscopy and was found to have gastritis with no ulcer.   She tells me today that she has progressive fatigue and dyspnea. Occasional dizziness. She has chronic LE edema. She has no chest pain. She lives alone in [place], [place]. Her children live out of town but one child is usually with her almost all of the time. Her husband died of pancreatic cancer in 12-Aug-2022. She is a retired Engineer, civil (consulting). She worked at [employer] and [employer] as well as in [place] and [place]. She has her own teeth. She has regular dental follow up.   Primary Care Physician: Sonny Masters, FNP  Primary Cardiologist: Herbie Baltimore Referring Cardiologist: Herbie Baltimore  Past Medical History:  Diagnosis Date   A-fib [facility])    Anxiety     CAD S/P percutaneous coronary angioplasty 1990s, 11/28/2011    a) midRCA PCI 1999 (NIR BMS 3.0 mm x 32 mm); distal  RCA BMS Sept 2012 (Vision BMS 2.5 mm x 15 mm); 12/02: RCA ISR --> Cutting PTCA    Coronary stent restenosis due to progression of disease 11/30/2011   Dyslipidemia, goal LDL below 70 11/28/2011   Essential hypertension    On ACE inhibitor and ARB? As well as atenolol and hydralazine   Gastric ulcer 11/2018   GERD (gastroesophageal reflux disease)    History of blood transfusion    without reaction   Moderate aortic stenosis by prior echocardiogram 08/2011; 08/2013   Echo 11/09/18: EF 60-65%. Gr 2 DD.  No RWMA.  Mild-Mod AS (mean gradient 19 mmHg). Mild-Mod PAH. ->  Follow-up echo March 2021 showed mean gradient 17 mmHg (dimensionless index 0.32)-this was reported as moderate-severe AS   Myocardial infarction (HCC) 1990s   Had PTCA then PCI on RCA; 11/2011--> Dyspnea and Shoulder pain prior to adm    Non-ST elevation MI (NSTEMI) (HCC) 11/28/2011   Dyspnea and Shoulder pain prior to adm   Type 2 diabetes mellitus with complication, without long-term current use of insulin (HCC)    no longer taking diabetic medication - diet controlled    Past Surgical History:  Procedure Laterality Date   ABDOMINAL HYSTERECTOMY     BIOPSY  06/03/2018   Procedure: BIOPSY;  Surgeon: Malissa Hippo, MD;  [facility];  Service: Endoscopy;;  gastric    BIOPSY  09/15/2022   Procedure: BIOPSY;  Surgeon: Eloise Harman, DO;  [facility];  Service: Endoscopy;;   BREAST BIOPSY     CARDIAC CATHETERIZATION  2012   performed for angina   CARDIAC CATHETERIZATION     CARDIOVERSION N/A 10/24/2020   Procedure: CARDIOVERSION;  Surgeon: Jerline Pain, MD;  Location: [facility];  Service: Cardiovascular;  Laterality: N/A;   CORONARY ANGIOPLASTY  11/2006   Cutting Balloon of stent in the RCA   ESOPHAGOGASTRODUODENOSCOPY N/A 06/03/2018   Procedure: ESOPHAGOGASTRODUODENOSCOPY  (EGD);  Surgeon: Rogene Houston, MD;  [facility];  Service: Endoscopy;  Laterality: N/A;  1:40   ESOPHAGOGASTRODUODENOSCOPY N/A 12/02/2018   Procedure: ESOPHAGOGASTRODUODENOSCOPY (EGD);  Surgeon: Rogene Houston, MD;  [facility];  Service: Endoscopy;  Laterality: N/A;  255   ESOPHAGOGASTRODUODENOSCOPY (EGD) WITH PROPOFOL N/A 09/15/2022   Procedure: ESOPHAGOGASTRODUODENOSCOPY (EGD) WITH PROPOFOL;  Surgeon: Eloise Harman, DO;  [facility];  Service: Endoscopy;  Laterality: N/A;  1:00pm, asa 3   LEFT HEART CATHETERIZATION WITH CORONARY ANGIOGRAM N/A 11/27/2011   Procedure: LEFT HEART CATHETERIZATION WITH CORONARY ANGIOGRAM;  Surgeon: Leonie Man, MD;  Location: [facility] C[facility];  Service: Cardiovascular;  Laterality: N/A;   PERCUTANEOUS CORONARY STENT INTERVENTION (PCI-S)  1999   Mid RCA - NIR DMS 3.0 mm x 30 mm, dRCA Vision BMS 2.5 mm x 15 mm    TRANSTHORACIC ECHOCARDIOGRAM  10/2018   EF 60-65%. Gr 2 DD.  No RWMA.  Mild-Mod AS  (mean gradient 19 mmHg). Mild-Mod PAH.   TRANSTHORACIC ECHOCARDIOGRAM  03/01/2020   EF 70 to 75%.  No or WMA.  Mild LVH.  GRII DD with severe LA enlargement.  Mildly elevated PAP.  Mild MR.  Moderate AS (mean gradient 17 mmHg) with dimensionless index of 0.32.   TUBAL LIGATION      Current Outpatient Medications  Medication Sig Dispense Refill   acetaminophen (TYLENOL) 500 MG tablet Take 1,000 mg by mouth every 6 (six) hours as needed for moderate pain.     apixaban (ELIQUIS) 2.5 MG TABS tablet Take 1 tablet (2.5 mg total) by mouth 2 (two) times daily. 60 tablet 0   atorvastatin (LIPITOR) 20 MG tablet Take 20 mg by mouth at bedtime.      benazepril (LOTENSIN) 40 MG tablet Take 1 tablet (40 mg total) by mouth daily. (Patient taking differently: Take 20 mg by mouth 2 (two) times daily.) 90 tablet 3   carvedilol (COREG) 25 MG tablet TAKE ONE TABLET BY MOUTH TWICE DAILY 180 tablet 3   diclofenac Sodium (VOLTAREN) 1 % GEL Apply 2-4 g  topically 2 (two) times daily as needed (back/hand pain.).      furosemide (LASIX) 40 MG tablet Take 1 tablet (40 mg total) by mouth daily as needed for fluid. 30 tablet 3   hydrALAZINE (APRESOLINE) 100 MG tablet TAKE ONE (1) TABLET BY MOUTH TWO (2) TIMES DAILY 180 tablet 1   MELATONIN PO Take 1 tablet by mouth at bedtime as needed (sleep).     NITROSTAT 0.4 MG SL tablet DISSOLVE 1 TAB UNDER TOUNGE FOR CHEST PAIN. MAY REPEAT EVERY 5 MINUTES FOR 3 DOSES. IF NO RELIEF CALL 911 OR GO TO ER 25 tablet 3   pantoprazole (PROTONIX) 40 MG tablet Take 1 tablet (40 mg total) by mouth 2 (two) times daily before a meal. 60 tablet 3  sertraline (ZOLOFT) 25 MG tablet TAKE ONE (1) TABLET BY MOUTH EVERY DAY 90 tablet 0   sucralfate (CARAFATE) 1 GM/10ML suspension TAKE 10ML BY MOUTH 4 TIMES DAILY WITH MEALS & AT BEDTIME 420 mL 0   traMADol (ULTRAM) 50 MG tablet TAKE 1 TABLET BY MOUTH EVERY 6 HOURS AS NEEDED FOR PAIN 30 tablet 1   No current facility-administered medications for this visit.    Allergies  Allergen Reactions   Valium Nausea And Vomiting    Social History   Socioeconomic History   Marital status: Widowed    Spouse name: Carloyn Manner   Number of children: 3   Years of education: Not on file   Highest education level: Not on file  Occupational History   Occupation: Retired Marine scientist  Tobacco Use   Smoking status: Former   Smokeless tobacco: Never   Tobacco comments:    Quit approximately 1993  Vaping Use   Vaping Use: Never used  Substance and Sexual Activity   Alcohol use: No   Drug use: No   Sexual activity: Not Currently    Birth control/protection: Post-menopausal  Other Topics Concern   Not on file  Social History Narrative   She is a married mother of 18, 2 daughters, 1 son who all live out of state but talks to weekly.   Step-son and his family live next door. Sees often.    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    Quit smoking in '99, denies alcohol.   She volunteers for  [employer].   Social Determinants of Health   Financial Resource Strain: Low Risk  (06/18/2022)   Overall Financial Resource Strain (CARDIA)    Difficulty of Paying Living Expenses: Not hard at all  Food Insecurity: No Food Insecurity (06/18/2022)   Hunger Vital Sign    Worried About Running Out of Food in the Last Year: Never true    Ran Out of Food in the Last Year: Never true  Transportation Needs: No Transportation Needs (06/18/2022)   PRAPARE - Hydrologist (Medical): No    Lack of Transportation (Non-Medical): No  Physical Activity: Inactive (06/18/2022)   Exercise Vital Sign    Days of Exercise per Week: 0 days    Minutes of Exercise per Session: 0 min  Stress: No Stress Concern Present (06/18/2022)   [place]    Feeling of Stress : Only a little  Social Connections: Moderately Integrated (06/18/2022)   Social Connection and Isolation Panel [NHANES]    Frequency of Communication with Friends and Family: More than three times a week    Frequency of Social Gatherings with Friends and Family: More than three times a week    Attends Religious Services: 1 to 4 times per year    Active Member of [employer] or Organizations: No    Attends Archivist Meetings: Never    Marital Status: Married  Human resources officer Violence: Not At Risk (06/18/2022)   Humiliation, Afraid, Rape, and Kick questionnaire    Fear of Current or Ex-Partner: No    Emotionally Abused: No    Physically Abused: No    Sexually Abused: No    Family History  Problem Relation Age of Onset   Cancer Mother    Heart disease Father    Heart disease Maternal Grandmother    Heart disease Maternal Grandfather    Heart disease Brother    Nephrolithiasis  Brother        accidental death   Heart Problems Brother        CABG    Review of Systems:  As stated in the HPI and otherwise negative.   BP (!) 176/76   Pulse  78   Ht 5\' 5"  (1.651 m)   Wt 123 lb 12.8 oz (56.2 kg)   SpO2 98%   BMI 20.60 kg/m   Physical Examination: General: Well developed, well nourished, NAD  HEENT: OP clear, mucus membranes moist  SKIN: warm, dry. No rashes. Neuro: No focal deficits  Musculoskeletal: Muscle strength 5/5 all ext  Psychiatric: Mood and affect normal  Neck: No JVD, no carotid bruits, no thyromegaly, no lymphadenopathy.  Lungs:Clear bilaterally, no wheezes, rhonci, crackles Cardiovascular: Regular rate and rhythm. Harsh systolic murmur.  Abdomen:Soft. Bowel sounds present. Non-tender.  Extremities: No lower extremity edema. Pulses are 2 + in the bilateral DP/PT.  EKG:  EKG is ordered today. The ekg ordered today demonstrates Atrial fib, rate 78 bpm. Non-specific T wave abn  Echo 06/20/22:  1. Left ventricular ejection fraction, by estimation, is 65 to 70%. The  left ventricle has normal function. The left ventricle has no regional  wall motion abnormalities. Left ventricular diastolic function could not  be evaluated.   2. Right ventricular systolic function is mildly reduced. The right  ventricular size is normal. There is mildly elevated pulmonary artery  systolic pressure. The estimated right ventricular systolic pressure is  Q000111Q mmHg.   3. Left atrial size was severely dilated.   4. Right atrial size was severely dilated.   5. The mitral valve is degenerative. Mild mitral valve regurgitation. No  evidence of mitral stenosis.   6. Tricuspid valve regurgitation is moderate.   7. The aortic valve is normal in structure. There is severe calcifcation  of the aortic valve. There is severe thickening of the aortic valve.  Aortic valve regurgitation is trivial. Moderate to severe aortic valve  stenosis. Aortic valve area, by VTI  measures 0.72 cm. Aortic valve mean gradient measures 27.0 mmHg. Aortic  valve Vmax measures 3.39 m/s.      DVI 0.23. Although the mean AVG and peak velocity are consistent  with  moderate AS, the SVI is low at < 34 and DVI is low consistent with severe  AS. This is most consistent with at least moderate to severe and possibly  severe low gradient low flow AS  with preserved EF.   8. The inferior vena cava is normal in size with greater than 50%  respiratory variability, suggesting right atrial pressure of 3 mmHg.   9. Compared to study dated 11/27/2021, the DVI has decreased from  0.24>0.23, AVA has decreased from 0.81cm2>>0.72cm2, and no change in mean  AVG.   FINDINGS   Left Ventricle: Left ventricular ejection fraction, by estimation, is 65  to 70%. The left ventricle has normal function. The left ventricle has no  regional wall motion abnormalities. The left ventricular internal cavity  size was normal in size. There is   no left ventricular hypertrophy. Left ventricular diastolic function  could not be evaluated. Left ventricular diastolic function could not be  evaluated due to atrial fibrillation.   Right Ventricle: The right ventricular size is normal. No increase in  right ventricular wall thickness. Right ventricular systolic function is  mildly reduced. There is mildly elevated pulmonary artery systolic  pressure. The tricuspid regurgitant velocity   is 3.05 m/s, and with an assumed right  atrial pressure of 3 mmHg, the  estimated right ventricular systolic pressure is Q000111Q mmHg.   Left Atrium: Left atrial size was severely dilated.   Right Atrium: Right atrial size was severely dilated.   Pericardium: There is no evidence of pericardial effusion.   Mitral Valve: The mitral valve is degenerative in appearance. There is  mild calcification of the anterior mitral valve leaflet(s). Mild mitral  valve regurgitation. No evidence of mitral valve stenosis.   Tricuspid Valve: The tricuspid valve is normal in structure. Tricuspid  valve regurgitation is moderate . No evidence of tricuspid stenosis.   Aortic Valve: The aortic valve is normal in  structure. There is severe  calcifcation of the aortic valve. There is severe thickening of the aortic  valve. Aortic valve regurgitation is trivial. Moderate to severe aortic  stenosis is present. Aortic valve  mean gradient measures 27.0 mmHg. Aortic valve peak gradient measures 46.0  mmHg. Aortic valve area, by VTI measures 0.72 cm.   Pulmonic Valve: The pulmonic valve was normal in structure. Pulmonic valve  regurgitation is not visualized. No evidence of pulmonic stenosis.   Aorta: The aortic root is normal in size and structure.   Venous: The inferior vena cava is normal in size with greater than 50%  respiratory variability, suggesting right atrial pressure of 3 mmHg.   IAS/Shunts: No atrial level shunt detected by color flow Doppler.   LEFT VENTRICLE  PLAX 2D  LVIDd:         3.70 cm   Diastology  LVIDs:         2.30 cm   LV e' medial: 9.57 cm/s  LV PW:         1.10 cm  LV IVS:        0.90 cm  LVOT diam:     2.00 cm  LV SV:         55  LV SV Index:   34  LVOT Area:     3.14 cm      RIGHT VENTRICLE  RV Basal diam:  2.80 cm  TAPSE (M-mode): 1.6 cm  RVSP:           45.2 mmHg   LEFT ATRIUM             Index        RIGHT ATRIUM           Index  LA diam:        4.00 cm 2.52 cm/m   RA Pressure: 8.00 mmHg  LA Vol (A2C):   82.2 ml 51.73 ml/m  RA Area:     21.20 cm  LA Vol (A4C):   67.0 ml 42.17 ml/m  RA Volume:   57.70 ml  36.31 ml/m  LA Biplane Vol: 73.6 ml 46.32 ml/m   AORTIC VALVE  AV Area (Vmax):    0.74 cm  AV Area (Vmean):   0.70 cm  AV Area (VTI):     0.72 cm  AV Vmax:           339.00 cm/s  AV Vmean:          243.000 cm/s  AV VTI:            0.760 m  AV Peak Grad:      46.0 mmHg  AV Mean Grad:      27.0 mmHg  LVOT Vmax:         80.00 cm/s  LVOT Vmean:  53.800 cm/s  LVOT VTI:          0.174 m  LVOT/AV VTI ratio: 0.23     AORTA  Ao Root diam: 3.00 cm  Ao Asc diam:  2.90 cm   TRICUSPID VALVE  TR Peak grad:   37.2 mmHg  TR Vmax:         305.00 cm/s  Estimated RAP:  8.00 mmHg  RVSP:           45.2 mmHg     SHUNTS  Systemic VTI:  0.17 m  Systemic Diam: 2.00 cm   Recent Labs: 08/06/2022: ALT 14; BUN 17; Creatinine, Ser 0.80; Potassium 4.7; Sodium 147 09/02/2022: Hemoglobin 10.1; Platelets 211   Wt Readings from Last 3 Encounters:  10/06/22 123 lb 12.8 oz (56.2 kg)  09/15/22 134 lb 4.2 oz (60.9 kg)  09/12/22 134 lb 3.2 oz (60.9 kg)    Assessment and Plan:   1. Severe Aortic Valve Stenosis: She has severe, stage D2 low flow/low gradient aortic valve stenosis. NYHA class 2. I have personally reviewed the echo images. The aortic valve is thickened, calcified with limited leaflet mobility. I think she would benefit from AVR. Given advanced age, she is not a good candidate for conventional AVR by surgical approach. I think she may be a good candidate for TAVR.   I have reviewed the natural history of aortic stenosis with the patient and their family members  who are present today. We have discussed the limitations of medical therapy and the poor prognosis associated with symptomatic aortic stenosis. We have reviewed potential treatment options, including palliative medical therapy, conventional surgical aortic valve replacement, and transcatheter aortic valve replacement. We discussed treatment options in the context of the patient's specific comorbid medical conditions.   She would like to proceed with planning for TAVR but she would like to go home and talk to her children in more detail before scheduling her cath. When she calls back, we will arrange a right and left heart catheterization at [facility]. I will see if this can fit into Dr. Allison Quarry schedule for cath or I can do it too. Risks and benefits of the cath procedure and the valve procedure are reviewed with the patient. After the cath, she will have a cardiac CT, CTA of the chest/abdomen and pelvis and will then be referred to see one of the CT surgeons on our TAVR team.    Labs/ tests ordered today include:   Orders Placed This Encounter  Procedures   EKG 12-Lead   Disposition:   F/U with the valve team   Signed, Lauree Chandler, MD, [facility] 10/06/2022 2:24 PM    [address], [place], [place]  [address] Phone: [phone]; Fax: [phone]

## 2022-10-07 ENCOUNTER — Other Ambulatory Visit: Payer: Self-pay | Admitting: Gastroenterology

## 2022-10-08 ENCOUNTER — Ambulatory Visit: Payer: Medicare Other | Admitting: Cardiovascular Disease

## 2022-10-16 ENCOUNTER — Telehealth: Payer: Self-pay | Admitting: Physician Assistant

## 2022-10-16 NOTE — Telephone Encounter (Signed)
  [facility]   MULTIDISCIPLINARY HEART VALVE TEAM  Called pt to see if she had decided to proceed with [facility] for TAVR workup. No answer. Left VM  Angelena Form PA-C  [facility]

## 2022-10-23 DIAGNOSIS — M79676 Pain in unspecified toe(s): Secondary | ICD-10-CM | POA: Diagnosis not present

## 2022-10-23 DIAGNOSIS — L84 Corns and callosities: Secondary | ICD-10-CM | POA: Diagnosis not present

## 2022-10-23 DIAGNOSIS — E119 Type 2 diabetes mellitus without complications: Secondary | ICD-10-CM | POA: Diagnosis not present

## 2022-10-23 DIAGNOSIS — B351 Tinea unguium: Secondary | ICD-10-CM | POA: Diagnosis not present

## 2022-10-30 ENCOUNTER — Ambulatory Visit (INDEPENDENT_AMBULATORY_CARE_PROVIDER_SITE_OTHER): Payer: Medicare Other | Admitting: Gastroenterology

## 2022-11-11 NOTE — Progress Notes (Addendum)
[facility]   MULTIDISCIPLINARY HEART VALVE CLINIC                                     Cardiology Office Note:    Date:  11/12/2022   ID:  [name], DOB [birth year], MRN [id number]  PCP:  Sonny Masters, FNP  [facility] Cardiologist:  Bryan Lemma, MD  [facility] Electrophysiologist:  None   Referring MD: Sonny Masters, FNP   Set up [facility] for TAVR work up  History of Present Illness:    MARGARETE HORACE is a 86 y.o. female with a hx of persistent atrial fibrillation, CAD with prior PCI, chronic diastolic CHF, HTN, HLD, DM diet controlled and severe aortic stenosis being worked up for TAVR who presents to clinic for follow up.  She has CAD and had a bare metal stent placed in the RCA in 1999 and another distal bare metal stent placed in the distal RCA in 2012. Last cardiac cath in 2012 with severe in stent restenosis in distal RCA stent treated with cutting balloon angioplasty. She has been followed over the past year for severe aortic stenosis. Echo 11/2021 with normal LV function, mild MR and severe low flow/low gradient AS with mean gradient 27 mmHg, AVA 0.8 cm2. She was called at that time and referral to the structural heart clinic was made but she had other family issues and did not schedule this appt. Most recent echo 06/20/22 with LVEF=65-70%, mild MR as well as severe low flow/low gradient AS with mean gradient 27 mmHg, AVA 0.72 cm2, DI 0.23, SVI 34. She had a recent upper endoscopy and was found to have gastritis with no ulcer.    She was seen by Dr. Clifton James in the structural heart clinic for TAVR work up on 10/06/22. Plan was to proceed with [facility] but the patient wanted to go home and discuss this with her family.   Today the patient presents to clinic for follow up. Here with her daughter. No CP or SOB. She has a lot of fatigue. She has intermittent LE edema for which she takes PRN lasix. No orthopnea or PND. No dizziness or syncope. No blood in  stool or urine. No palpitations. Saw the podiatrist and has some redness on big toe. Has follow up next week.   Past Medical History:  Diagnosis Date   A-fib [facility])    Anxiety    CAD S/P percutaneous coronary angioplasty 1990s, 11/28/2011    a) midRCA PCI 1999 (NIR BMS 3.0 mm x 32 mm); distal  RCA BMS Sept 2012 (Vision BMS 2.5 mm x 15 mm); 12/02: RCA ISR --> Cutting PTCA    Coronary stent restenosis due to progression of disease 11/30/2011   Dyslipidemia, goal LDL below 70 11/28/2011   Essential hypertension    On ACE inhibitor and ARB? As well as atenolol and hydralazine   Gastric ulcer 11/2018   GERD (gastroesophageal reflux disease)    History of blood transfusion    without reaction   Moderate aortic stenosis by prior echocardiogram 08/2011; 08/2013   Echo 11/09/18: EF 60-65%. Gr 2 DD.  No RWMA.  Mild-Mod AS (mean gradient 19 mmHg). Mild-Mod PAH. ->  Follow-up echo March 2021 showed mean gradient 17 mmHg (dimensionless index 0.32)-this was reported as moderate-severe AS   Myocardial infarction [facility]) 1990s   Had PTCA then PCI on RCA; 11/2011--> Dyspnea  and Shoulder pain prior to adm    Non-ST elevation MI (NSTEMI) (HCC) 11/28/2011   Dyspnea and Shoulder pain prior to adm   Type 2 diabetes mellitus with complication, without long-term current use of insulin (HCC)    no longer taking diabetic medication - diet controlled    Past Surgical History:  Procedure Laterality Date   ABDOMINAL HYSTERECTOMY     BIOPSY  06/03/2018   Procedure: BIOPSY;  Surgeon: Malissa Hippo, MD;  Location: [facility];  Service: Endoscopy;;  gastric    BIOPSY  09/15/2022   Procedure: BIOPSY;  Surgeon: Lanelle Bal, DO;  Location: [facility];  Service: Endoscopy;;   BREAST BIOPSY     CARDIAC CATHETERIZATION  2012   performed for angina   CARDIAC CATHETERIZATION     CARDIOVERSION N/A 10/24/2020   Procedure: CARDIOVERSION;  Surgeon: Jake Bathe, MD;  Location: [facility] ENDOSCOPY;  Service:  Cardiovascular;  Laterality: N/A;   CORONARY ANGIOPLASTY  11/2006   Cutting Balloon of stent in the RCA   ESOPHAGOGASTRODUODENOSCOPY N/A 06/03/2018   Procedure: ESOPHAGOGASTRODUODENOSCOPY (EGD);  Surgeon: Malissa Hippo, MD;  Location: [facility];  Service: Endoscopy;  Laterality: N/A;  1:40   ESOPHAGOGASTRODUODENOSCOPY N/A 12/02/2018   Procedure: ESOPHAGOGASTRODUODENOSCOPY (EGD);  Surgeon: Malissa Hippo, MD;  Location: [facility];  Service: Endoscopy;  Laterality: N/A;  255   ESOPHAGOGASTRODUODENOSCOPY (EGD) WITH PROPOFOL N/A 09/15/2022   Procedure: ESOPHAGOGASTRODUODENOSCOPY (EGD) WITH PROPOFOL;  Surgeon: Lanelle Bal, DO;  Location: [facility];  Service: Endoscopy;  Laterality: N/A;  1:00pm, asa 3   LEFT HEART CATHETERIZATION WITH CORONARY ANGIOGRAM N/A 11/27/2011   Procedure: LEFT HEART CATHETERIZATION WITH CORONARY ANGIOGRAM;  Surgeon: Marykay Lex, MD;  Location: [facility] CATH LAB;  Service: Cardiovascular;  Laterality: N/A;   PERCUTANEOUS CORONARY STENT INTERVENTION (PCI-S)  1999   Mid RCA - NIR DMS 3.0 mm x 30 mm, dRCA Vision BMS 2.5 mm x 15 mm    TRANSTHORACIC ECHOCARDIOGRAM  10/2018   EF 60-65%. Gr 2 DD.  No RWMA.  Mild-Mod AS  (mean gradient 19 mmHg). Mild-Mod PAH.   TRANSTHORACIC ECHOCARDIOGRAM  03/01/2020   EF 70 to 75%.  No or WMA.  Mild LVH.  GRII DD with severe LA enlargement.  Mildly elevated PAP.  Mild MR.  Moderate AS (mean gradient 17 mmHg) with dimensionless index of 0.32.   TUBAL LIGATION      Current Medications: Current Meds  Medication Sig   acetaminophen (TYLENOL) 500 MG tablet Take 1,000 mg by mouth every 6 (six) hours as needed for moderate pain.   atorvastatin (LIPITOR) 20 MG tablet Take 20 mg by mouth at bedtime.    benazepril (LOTENSIN) 40 MG tablet Take 1 tablet (40 mg total) by mouth daily. (Patient taking differently: Take 20 mg by mouth 2 (two) times daily.)   carvedilol (COREG) 25 MG tablet TAKE ONE TABLET BY MOUTH TWICE DAILY   diclofenac  Sodium (VOLTAREN) 1 % GEL Apply 2-4 g topically 2 (two) times daily as needed (back/hand pain.).    diphenhydramine-acetaminophen (TYLENOL PM) 25-500 MG TABS tablet Take 1 tablet by mouth at bedtime.   furosemide (LASIX) 40 MG tablet Take 1 tablet (40 mg total) by mouth daily as needed for fluid.   hydrALAZINE (APRESOLINE) 100 MG tablet TAKE ONE (1) TABLET BY MOUTH TWO (2) TIMES DAILY   NITROSTAT 0.4 MG SL tablet DISSOLVE 1 TAB UNDER TOUNGE FOR CHEST PAIN. MAY REPEAT EVERY 5 MINUTES FOR  3 DOSES. IF NO RELIEF CALL 911 OR GO TO ER   pantoprazole (PROTONIX) 40 MG tablet Take 1 tablet (40 mg total) by mouth 2 (two) times daily before a meal.   sertraline (ZOLOFT) 25 MG tablet TAKE ONE (1) TABLET BY MOUTH EVERY DAY   traMADol (ULTRAM) 50 MG tablet TAKE 1 TABLET BY MOUTH EVERY 6 HOURS AS NEEDED FOR PAIN   [DISCONTINUED] MELATONIN PO Take 1 tablet by mouth at bedtime as needed (sleep).   [DISCONTINUED] sucralfate (CARAFATE) 1 GM/10ML suspension TAKE BY MOUTH 4 TIMES DAILY WITH MEALS & AT BEDTIME     Allergies:   Valium   Social History   Socioeconomic History   Marital status: Widowed    Spouse name: Channing Mutters   Number of children: 3   Years of education: Not on file   Highest education level: Not on file  Occupational History   Occupation: Retired Engineer, civil (consulting)  Tobacco Use   Smoking status: Former   Smokeless tobacco: Never   Tobacco comments:    Quit approximately 1993  Vaping Use   Vaping Use: Never used  Substance and Sexual Activity   Alcohol use: No   Drug use: No   Sexual activity: Not Currently    Birth control/protection: Post-menopausal  Other Topics Concern   Not on file  Social History Narrative   She is a married mother of 3, 2 daughters, 1 son who all live out of state but talks to weekly.   Step-son and his family live next door. Sees often.    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    Quit smoking in '99, denies alcohol.   She volunteers for [employer].    Social Determinants of Health   Financial Resource Strain: Low Risk  (06/18/2022)   Overall Financial Resource Strain (CARDIA)    Difficulty of Paying Living Expenses: Not hard at all  Food Insecurity: No Food Insecurity (06/18/2022)   Hunger Vital Sign    Worried About Running Out of Food in the Last Year: Never true    Ran Out of Food in the Last Year: Never true  Transportation Needs: No Transportation Needs (06/18/2022)   PRAPARE - Administrator, Civil Service (Medical): No    Lack of Transportation (Non-Medical): No  Physical Activity: Inactive (06/18/2022)   Exercise Vital Sign    Days of Exercise per Week: 0 days    Minutes of Exercise per Session: 0 min  Stress: No Stress Concern Present (06/18/2022)   [employer] of Occupational Health - Occupational Stress Questionnaire    Feeling of Stress : Only a little  Social Connections: Moderately Integrated (06/18/2022)   Social Connection and Isolation Panel [NHANES]    Frequency of Communication with Friends and Family: More than three times a week    Frequency of Social Gatherings with Friends and Family: More than three times a week    Attends Religious Services: 1 to 4 times per year    Active Member of [employer] or Organizations: No    Attends Engineer, structural: Never    Marital Status: Married     Family History: The patient's family history includes Cancer in her mother; Heart Problems in her brother; Heart disease in her brother, father, maternal grandfather, and maternal grandmother; Nephrolithiasis in her brother.  ROS:   Please see the history of present illness.    All other systems reviewed and are negative.  EKGs/Labs/Other Studies Reviewed:  The following studies were reviewed today:  Echo 06/20/22:  1. Left ventricular ejection fraction, by estimation, is 65 to 70%. The  left ventricle has normal function. The left ventricle has no regional  wall motion abnormalities. Left  ventricular diastolic function could not  be evaluated.   2. Right ventricular systolic function is mildly reduced. The right  ventricular size is normal. There is mildly elevated pulmonary artery  systolic pressure. The estimated right ventricular systolic pressure is  40.2 mmHg.   3. Left atrial size was severely dilated.   4. Right atrial size was severely dilated.   5. The mitral valve is degenerative. Mild mitral valve regurgitation. No  evidence of mitral stenosis.   6. Tricuspid valve regurgitation is moderate.   7. The aortic valve is normal in structure. There is severe calcifcation  of the aortic valve. There is severe thickening of the aortic valve.  Aortic valve regurgitation is trivial. Moderate to severe aortic valve  stenosis. Aortic valve area, by VTI  measures 0.72 cm. Aortic valve mean gradient measures 27.0 mmHg. Aortic  valve Vmax measures 3.39 m/s.      DVI 0.23. Although the mean AVG and peak velocity are consistent with  moderate AS, the SVI is low at < 34 and DVI is low consistent with severe  AS. This is most consistent with at least moderate to severe and possibly  severe low gradient low flow AS  with preserved EF.   8. The inferior vena cava is normal in size with greater than 50%  respiratory variability, suggesting right atrial pressure of 3 mmHg.   9. Compared to study dated 11/27/2021, the DVI has decreased from  0.24>0.23, AVA has decreased from 0.81cm2>>0.72cm2, and no change in mean  AVG.   FINDINGS   Left Ventricle: Left ventricular ejection fraction, by estimation, is 65  to 70%. The left ventricle has normal function. The left ventricle has no  regional wall motion abnormalities. The left ventricular internal cavity  size was normal in size. There is   no left ventricular hypertrophy. Left ventricular diastolic function  could not be evaluated. Left ventricular diastolic function could not be  evaluated due to atrial fibrillation.   Right  Ventricle: The right ventricular size is normal. No increase in  right ventricular wall thickness. Right ventricular systolic function is  mildly reduced. There is mildly elevated pulmonary artery systolic  pressure. The tricuspid regurgitant velocity   is 3.05 m/s, and with an assumed right atrial pressure of 3 mmHg, the  estimated right ventricular systolic pressure is 40.2 mmHg.   Left Atrium: Left atrial size was severely dilated.   Right Atrium: Right atrial size was severely dilated.   Pericardium: There is no evidence of pericardial effusion.   Mitral Valve: The mitral valve is degenerative in appearance. There is  mild calcification of the anterior mitral valve leaflet(s). Mild mitral  valve regurgitation. No evidence of mitral valve stenosis.   Tricuspid Valve: The tricuspid valve is normal in structure. Tricuspid  valve regurgitation is moderate . No evidence of tricuspid stenosis.   Aortic Valve: The aortic valve is normal in structure. There is severe  calcifcation of the aortic valve. There is severe thickening of the aortic  valve. Aortic valve regurgitation is trivial. Moderate to severe aortic  stenosis is present. Aortic valve  mean gradient measures 27.0 mmHg. Aortic valve peak gradient measures 46.0  mmHg. Aortic valve area, by VTI measures 0.72 cm.   Pulmonic Valve: The pulmonic  valve was normal in structure. Pulmonic valve  regurgitation is not visualized. No evidence of pulmonic stenosis.   Aorta: The aortic root is normal in size and structure.   Venous: The inferior vena cava is normal in size with greater than 50%  respiratory variability, suggesting right atrial pressure of 3 mmHg.   IAS/Shunts: No atrial level shunt detected by color flow Doppler.   LEFT VENTRICLE  PLAX 2D  LVIDd:         3.70 cm   Diastology  LVIDs:         2.30 cm   LV e' medial: 9.57 cm/s  LV PW:         1.10 cm  LV IVS:        0.90 cm  LVOT diam:     2.00 cm  LV SV:          55  LV SV Index:   34  LVOT Area:     3.14 cm      RIGHT VENTRICLE  RV Basal diam:  2.80 cm  TAPSE (M-mode): 1.6 cm  RVSP:           45.2 mmHg   LEFT ATRIUM             Index        RIGHT ATRIUM           Index  LA diam:        4.00 cm 2.52 cm/m   RA Pressure: 8.00 mmHg  LA Vol (A2C):   82.2 ml 51.73 ml/m  RA Area:     21.20 cm  LA Vol (A4C):   67.0 ml 42.17 ml/m  RA Volume:   57.70 ml  36.31 ml/m  LA Biplane Vol: 73.6 ml 46.32 ml/m   AORTIC VALVE  AV Area (Vmax):    0.74 cm  AV Area (Vmean):   0.70 cm  AV Area (VTI):     0.72 cm  AV Vmax:           339.00 cm/s  AV Vmean:          243.000 cm/s  AV VTI:            0.760 m  AV Peak Grad:      46.0 mmHg  AV Mean Grad:      27.0 mmHg  LVOT Vmax:         80.00 cm/s  LVOT Vmean:        53.800 cm/s  LVOT VTI:          0.174 m  LVOT/AV VTI ratio: 0.23     AORTA  Ao Root diam: 3.00 cm  Ao Asc diam:  2.90 cm   TRICUSPID VALVE  TR Peak grad:   37.2 mmHg  TR Vmax:        305.00 cm/s  Estimated RAP:  8.00 mmHg  RVSP:           45.2 mmHg     SHUNTS  Systemic VTI:  0.17 m  Systemic Diam: 2.00 cm   EKG:  EKG is ordered today.  The ekg ordered today demonstrates afib HR 85  Recent Labs: 08/06/2022: ALT 14; BUN 17; Creatinine, Ser 0.80; Potassium 4.7; Sodium 147 09/02/2022: Hemoglobin 10.1; Platelets 211  Recent Lipid Panel    Component Value Date/Time   CHOL 137 03/19/2022 1358   TRIG 96 03/19/2022 1358   HDL 69 03/19/2022 1358   CHOLHDL 2.0 03/19/2022 1358  LDLCALC 50 03/19/2022 1358     Risk Assessment/Calculations:       Physical Exam:    VS:  BP (!) 144/90   Pulse 85   Ht 5' 5.5" (1.664 m)   Wt 137 lb 9.6 oz (62.4 kg)   SpO2 97%   BMI 22.55 kg/m     Wt Readings from Last 3 Encounters:  11/12/22 137 lb 9.6 oz (62.4 kg)  10/06/22 123 lb 12.8 oz (56.2 kg)  09/15/22 134 lb 4.2 oz (60.9 kg)     GEN:  Well nourished, well developed in no acute distress HEENT: Normal NECK: No JVD LYMPHATICS: No  lymphadenopathy CARDIAC: irreg irreg, no murmurs, rubs, gallops RESPIRATORY:  Clear to auscultation without rales, wheezing or rhonchi  ABDOMEN: Soft, non-tender, non-distended MUSCULOSKELETAL:  No edema; No deformity  SKIN: Warm and dry. Right big toe with some erythema around nailbed c/w paronychia  NEUROLOGIC:  Alert and oriented x 3 PSYCHIATRIC:  Normal affect   ASSESSMENT:    1. Severe aortic stenosis    PLAN:    In order of problems listed above:  Severe AS: with NYHA class II symptoms. We spent a lot of time today discussing TAVR and work up. Next step is L/RHC. She is now wanting to proceed. CBC and BMET today.   The risks [stroke (1 in 1000), death (1 in 1000), kidney failure [usually temporary] (1 in 500), bleeding (1 in 200), allergic reaction [possibly serious] (1 in 200)], benefits (diagnostic support and management of coronary artery disease) and alternatives of a cardiac catheterization were discussed in detail with Ms. Borrero and she is willing to proceed.   Procedure Type: Isolated AVR Perioperative Outcome Estimate % Operative Mortality 5.73% Morbidity & Mortality 13.4% Stroke 1.26% Renal Failure 2.2% Reoperation 3.37% Prolonged Ventilation 5.5% Deep Sternal Wound Infection 0.038% Long Hospital Stay (>14 days) 9.17% Short Hospital Stay (<6 days)* 22.7%    Medication Adjustments/Labs and Tests Ordered: Current medicines are reviewed at length with the patient today.  Concerns regarding medicines are outlined above.  Orders Placed This Encounter  Procedures   CBC   Basic metabolic panel   EKG 12-Lead   No orders of the defined types were placed in this encounter.   Patient Instructions  Medication Instructions:   Your physician recommends that you continue on your current medications as directed. Please refer to the Current Medication list given to you today.  *If you need a refill on your cardiac medications before your next appointment, please  call your pharmacy*   Lab Work: CBC AND BMET TODAY     If you have labs (blood work) drawn today and your tests are completely normal, you will receive your results only by: MyChart Message (if you have MyChart) OR A paper copy in the mail If you have any lab test that is abnormal or we need to change your treatment, we will call you to review the results.   Testing/Procedures:  ON Nov 24 2022 Your physician has requested that you have a cardiac catheterization. Cardiac catheterization is used to diagnose and/or treat various heart conditions. Doctors may recommend this procedure for a number of different reasons. The most common reason is to evaluate chest pain. Chest pain can be a symptom of coronary artery disease (CAD), and cardiac catheterization can show whether plaque is narrowing or blocking your heart's arteries. This procedure is also used to evaluate the valves, as well as measure the blood flow and oxygen levels in different parts  of your heart. For further information please visit [email or web address] Please follow instruction sheet, as given.     Follow-Up: At [facility], you and your health needs are our priority.  As part of our continuing mission to provide you with exceptional heart care, we have created designated Provider Care Teams.  These Care Teams include your primary Cardiologist (physician) and Advanced Practice Providers (APPs -  Physician Assistants and Nurse Practitioners) who all work together to provide you with the care you need, when you need it.  We recommend signing up for the patient portal called "MyChart".  Sign up information is provided on this After Visit Summary.  MyChart is used to connect with patients for Virtual Visits (Telemedicine).  Patients are able to view lab/test results, encounter notes, upcoming appointments, etc.  Non-urgent messages can be sent to your provider as well.   To learn more about what you can do with MyChart, go to  [email or web address].    Your next appointment:  YOU WILL BE SCHEDULED BY STRUCTURAL TEAM   The format for your next appointment:   In Person  Provider:    Verne Carrowhristopher McAlhany, MD, Georgie ChardJill McDaniel, NP, or Carlean JewsKatie Stephanne Greeley, PA-C  {  Other Instructions   Important Information About Sugar         Signed, Cline CrockKathryn Maddy Graham, PA-C  11/12/2022 8:02 PM    [facility]

## 2022-11-12 ENCOUNTER — Ambulatory Visit: Payer: Medicare Other | Attending: Physician Assistant | Admitting: Physician Assistant

## 2022-11-12 ENCOUNTER — Encounter: Payer: Self-pay | Admitting: *Deleted

## 2022-11-12 VITALS — BP 144/90 | HR 85 | Ht 65.5 in | Wt 137.6 lb

## 2022-11-12 DIAGNOSIS — I350 Nonrheumatic aortic (valve) stenosis: Secondary | ICD-10-CM | POA: Diagnosis not present

## 2022-11-12 NOTE — Patient Instructions (Addendum)
Medication Instructions:   Your physician recommends that you continue on your current medications as directed. Please refer to the Current Medication list given to you today.  *If you need a refill on your cardiac medications before your next appointment, please call your pharmacy*   Lab Work: CBC AND BMET TODAY     If you have labs (blood work) drawn today and your tests are completely normal, you will receive your results only by: MyChart Message (if you have MyChart) OR A paper copy in the mail If you have any lab test that is abnormal or we need to change your treatment, we will call you to review the results.   Testing/Procedures:  ON Nov 24 2022 Your physician has requested that you have a cardiac catheterization. Cardiac catheterization is used to diagnose and/or treat various heart conditions. Doctors may recommend this procedure for a number of different reasons. The most common reason is to evaluate chest pain. Chest pain can be a symptom of coronary artery disease (CAD), and cardiac catheterization can show whether plaque is narrowing or blocking your heart's arteries. This procedure is also used to evaluate the valves, as well as measure the blood flow and oxygen levels in different parts of your heart. For further information please visit [email or web address] Please follow instruction sheet, as given.     Follow-Up: At [facility], you and your health needs are our priority.  As part of our continuing mission to provide you with exceptional heart care, we have created designated Provider Care Teams.  These Care Teams include your primary Cardiologist (physician) and Advanced Practice Providers (APPs -  Physician Assistants and Nurse Practitioners) who all work together to provide you with the care you need, when you need it.  We recommend signing up for the patient portal called "MyChart".  Sign up information is provided on this After Visit Summary.  MyChart is used  to connect with patients for Virtual Visits (Telemedicine).  Patients are able to view lab/test results, encounter notes, upcoming appointments, etc.  Non-urgent messages can be sent to your provider as well.   To learn more about what you can do with MyChart, go to [email or web address].    Your next appointment:  YOU WILL BE SCHEDULED BY STRUCTURAL TEAM   The format for your next appointment:   In Person  Provider:    Verne Carrow, MD, Georgie Chard, NP, or Carlean Jews, PA-C  {  Other Instructions   Important Information About Sugar

## 2022-11-13 LAB — BASIC METABOLIC PANEL
BUN/Creatinine Ratio: 22 (ref 12–28)
BUN: 19 mg/dL (ref 8–27)
CO2: 24 mmol/L (ref 20–29)
Calcium: 9.2 mg/dL (ref 8.7–10.3)
Chloride: 105 mmol/L (ref 96–106)
Creatinine, Ser: 0.87 mg/dL (ref 0.57–1.00)
Glucose: 99 mg/dL (ref 70–99)
Potassium: 3.7 mmol/L (ref 3.5–5.2)
Sodium: 145 mmol/L — ABNORMAL HIGH (ref 134–144)
eGFR: 66 mL/min/{1.73_m2} (ref >59)

## 2022-11-13 LAB — CBC
Hematocrit: 30.9 % — ABNORMAL LOW (ref 34.0–46.6)
Hemoglobin: 9.9 g/dL — ABNORMAL LOW (ref 11.1–15.9)
MCH: 27.7 pg (ref 26.6–33.0)
MCHC: 32 g/dL (ref 31.5–35.7)
MCV: 86 fL (ref 79–97)
Platelets: 211 10*3/uL (ref 150–450)
RBC: 3.58 x10E6/uL — ABNORMAL LOW (ref 3.77–5.28)
RDW: 13.5 % (ref 11.7–15.4)
WBC: 5.1 10*3/uL (ref 3.4–10.8)

## 2022-11-14 DIAGNOSIS — L089 Local infection of the skin and subcutaneous tissue, unspecified: Secondary | ICD-10-CM | POA: Diagnosis not present

## 2022-11-17 ENCOUNTER — Emergency Department (HOSPITAL_COMMUNITY): Payer: Medicare Other

## 2022-11-17 ENCOUNTER — Encounter: Payer: Self-pay | Admitting: Gastroenterology

## 2022-11-17 ENCOUNTER — Ambulatory Visit: Payer: Medicare Other | Admitting: Gastroenterology

## 2022-11-17 ENCOUNTER — Other Ambulatory Visit: Payer: Self-pay

## 2022-11-17 ENCOUNTER — Emergency Department (HOSPITAL_COMMUNITY)
Admission: EM | Admit: 2022-11-17 | Discharge: 2022-11-17 | Disposition: A | Discharge status: Home / Self Care | Payer: Medicare Other | Attending: Emergency Medicine | Admitting: Emergency Medicine

## 2022-11-17 ENCOUNTER — Telehealth: Payer: Self-pay | Admitting: Gastroenterology

## 2022-11-17 VITALS — BP 223/112 | HR 108 | Temp 98.4°F | Ht 65.5 in | Wt 140.8 lb

## 2022-11-17 DIAGNOSIS — I482 Chronic atrial fibrillation, unspecified: Secondary | ICD-10-CM

## 2022-11-17 DIAGNOSIS — I350 Nonrheumatic aortic (valve) stenosis: Secondary | ICD-10-CM

## 2022-11-17 DIAGNOSIS — R140 Abdominal distension (gaseous): Secondary | ICD-10-CM | POA: Diagnosis not present

## 2022-11-17 DIAGNOSIS — K828 Other specified diseases of gallbladder: Secondary | ICD-10-CM | POA: Diagnosis not present

## 2022-11-17 DIAGNOSIS — Z7901 Long term (current) use of anticoagulants: Secondary | ICD-10-CM | POA: Insufficient documentation

## 2022-11-17 DIAGNOSIS — I4891 Unspecified atrial fibrillation: Secondary | ICD-10-CM | POA: Insufficient documentation

## 2022-11-17 DIAGNOSIS — R109 Unspecified abdominal pain: Secondary | ICD-10-CM | POA: Diagnosis not present

## 2022-11-17 DIAGNOSIS — R195 Other fecal abnormalities: Secondary | ICD-10-CM | POA: Diagnosis not present

## 2022-11-17 DIAGNOSIS — R1900 Intra-abdominal and pelvic swelling, mass and lump, unspecified site: Secondary | ICD-10-CM | POA: Diagnosis not present

## 2022-11-17 DIAGNOSIS — I10 Essential (primary) hypertension: Secondary | ICD-10-CM | POA: Insufficient documentation

## 2022-11-17 DIAGNOSIS — Z79899 Other long term (current) drug therapy: Secondary | ICD-10-CM | POA: Insufficient documentation

## 2022-11-17 DIAGNOSIS — J90 Pleural effusion, not elsewhere classified: Secondary | ICD-10-CM | POA: Diagnosis not present

## 2022-11-17 DIAGNOSIS — J9811 Atelectasis: Secondary | ICD-10-CM | POA: Diagnosis not present

## 2022-11-17 DIAGNOSIS — I160 Hypertensive urgency: Secondary | ICD-10-CM | POA: Diagnosis not present

## 2022-11-17 DIAGNOSIS — R1013 Epigastric pain: Secondary | ICD-10-CM | POA: Diagnosis not present

## 2022-11-17 DIAGNOSIS — I2510 Atherosclerotic heart disease of native coronary artery without angina pectoris: Secondary | ICD-10-CM | POA: Insufficient documentation

## 2022-11-17 DIAGNOSIS — R079 Chest pain, unspecified: Secondary | ICD-10-CM | POA: Diagnosis not present

## 2022-11-17 DIAGNOSIS — R778 Other specified abnormalities of plasma proteins: Secondary | ICD-10-CM | POA: Diagnosis not present

## 2022-11-17 DIAGNOSIS — D649 Anemia, unspecified: Secondary | ICD-10-CM | POA: Diagnosis not present

## 2022-11-17 DIAGNOSIS — E119 Type 2 diabetes mellitus without complications: Secondary | ICD-10-CM | POA: Insufficient documentation

## 2022-11-17 DIAGNOSIS — K7460 Unspecified cirrhosis of liver: Secondary | ICD-10-CM | POA: Diagnosis not present

## 2022-11-17 LAB — CBC WITH DIFFERENTIAL/PLATELET
Abs Immature Granulocytes: 0.02 10*3/uL (ref 0.00–0.07)
Basophils Absolute: 0 10*3/uL (ref 0.0–0.1)
Basophils Relative: 1 %
Eosinophils Absolute: 0.1 10*3/uL (ref 0.0–0.5)
Eosinophils Relative: 2 %
HCT: 32.2 % — ABNORMAL LOW (ref 36.0–46.0)
Hemoglobin: 10.9 g/dL — ABNORMAL LOW (ref 12.0–15.0)
Immature Granulocytes: 0 %
Lymphocytes Relative: 18 %
Lymphs Abs: 0.8 10*3/uL (ref 0.7–4.0)
MCH: 30.4 pg (ref 26.0–34.0)
MCHC: 33.9 g/dL (ref 30.0–36.0)
MCV: 89.7 fL (ref 80.0–100.0)
Monocytes Absolute: 0.4 10*3/uL (ref 0.1–1.0)
Monocytes Relative: 8 %
Neutro Abs: 3.5 10*3/uL (ref 1.7–7.7)
Neutrophils Relative %: 71 %
Platelets: 245 10*3/uL (ref 150–400)
RBC: 3.59 MIL/uL — ABNORMAL LOW (ref 3.87–5.11)
RDW: 14.7 % (ref 11.5–15.5)
WBC: 4.8 10*3/uL (ref 4.0–10.5)
nRBC: 0 % (ref 0.0–0.2)

## 2022-11-17 LAB — BASIC METABOLIC PANEL
Anion gap: 9 (ref 5–15)
BUN: 18 mg/dL (ref 8–23)
CO2: 27 mmol/L (ref 22–32)
Calcium: 9.3 mg/dL (ref 8.9–10.3)
Chloride: 106 mmol/L (ref 98–111)
Creatinine, Ser: 0.63 mg/dL (ref 0.44–1.00)
GFR, Estimated: >60 mL/min (ref >60)
Glucose, Bld: 125 mg/dL — ABNORMAL HIGH (ref 70–99)
Potassium: 3.4 mmol/L — ABNORMAL LOW (ref 3.5–5.1)
Sodium: 142 mmol/L (ref 135–145)

## 2022-11-17 LAB — TROPONIN I (HIGH SENSITIVITY)
Troponin I (High Sensitivity): 27 ng/L — ABNORMAL HIGH (ref <18)
Troponin I (High Sensitivity): 31 ng/L — ABNORMAL HIGH (ref <18)

## 2022-11-17 MED ORDER — HYDRALAZINE HCL 25 MG PO TABS
100.0000 mg | ORAL_TABLET | Freq: Once | ORAL | Status: AC
  Administered 2022-11-17: 100 mg via ORAL
  Filled 2022-11-17: qty 4

## 2022-11-17 MED ORDER — CARVEDILOL 12.5 MG PO TABS
25.0000 mg | ORAL_TABLET | Freq: Two times a day (BID) | ORAL | Status: DC
  Filled 2022-11-17: qty 2

## 2022-11-17 MED ORDER — BENAZEPRIL HCL 20 MG PO TABS
40.0000 mg | ORAL_TABLET | Freq: Every day | ORAL | Status: DC
  Administered 2022-11-17: 40 mg via ORAL
  Filled 2022-11-17: qty 2

## 2022-11-17 MED ORDER — CARVEDILOL 12.5 MG PO TABS
25.0000 mg | ORAL_TABLET | Freq: Two times a day (BID) | ORAL | Status: DC
  Administered 2022-11-17: 25 mg via ORAL

## 2022-11-17 MED ORDER — HYDROCORTISONE (PERIANAL) 2.5 % EX CREA: 1.0000 | TOPICAL_CREAM | Freq: Two times a day (BID) | CUTANEOUS | 1 refills | Status: DC

## 2022-11-17 NOTE — Telephone Encounter (Signed)
April R.: if patient does not get abdominal ultrasound while in the ER today, we will need to make sure she has done this week. Orders were placed.

## 2022-11-17 NOTE — ED Triage Notes (Signed)
Pt presents from [facility] GI for evaluation of high blood pressure.

## 2022-11-17 NOTE — ED Notes (Addendum)
Pt had endoscopy in September and has since had epigastri/mid upper abd pain that is worse with being hungry. Better after eating. Came from GI today and was sent over due to htn (has not had bp meds and pt states is always high in am and goes down after she takes it) and gi wanted ABD [place] to rule out AAA. Pt color wnl. Non diaphoretic. No pain at present.

## 2022-11-17 NOTE — ED Notes (Signed)
PA speaking with cardiology at this time

## 2022-11-17 NOTE — Patient Instructions (Addendum)
Anusol cream to apply around the anal opening and just inside, twice daily for rectal pain. Continue pantoprazole 40mg  twice daily before breakfast and evening meal.  Ferrous sulfate 325mg  twice daily for anemia. YOU CAN INCREASE ABSORPTION OF IRON BY CONSUME ALONG WITH OTHER ITEMS RICH IN VITAMIN C SUCH AS CITRUS JUICES, BROCCOLI, POTATOES, ETC. Abdominal ultrasound as scheduled.  We will need to keep check on your anemia. We will recheck in four weeks or so if not done by cardiology during their work up.  Your blood pressure is still very high putting you at risk of stroke, heart attack. I would recommend you go to the ER for evaluation and management. I HAVE ORDERED YOUR ULTRASOUND TO RULE OUT ANEURYSM TO BE DONE ASAP, YOU COULD ASK ER PROVIDER IF THEY CAN DO IT WHILE YOU ARE BEING EVALUATED IN THE ER.

## 2022-11-17 NOTE — ED Notes (Signed)
US in progress

## 2022-11-17 NOTE — Consult Note (Signed)
CARDIOLOGY CONSULT NOTE    Patient ID: [name]; [id number]; [birth year]   Admit date: 11/17/2022 Date of Consult: 11/17/2022  Primary Care Provider: Sonny Masters, FNP Primary Cardiologist: Dr. Herbie Baltimore, MD Primary Electrophysiologist: None   Patient Profile:   [name] is a 86 y.o. female with a hx of severe symptomatic D2 aortic valve stenosis, CAD with a RCA PCI, HTN, DM, diastolic CHF, chronic A-fib who is being seen today for the evaluation of HTN management at the request of April Cullens, DO.Marland Kitchen  History of Present Illness:   [name] is a 86 year old F known to have CAD status post RCA PCI, severe AS, HTN, DM, diastolic CHF, chronic A-fib was sent from GI clinic due to elevated blood pressures, 220s over 110 mmHg. Patient did not take her blood pressure medications this morning. Cardiology was consulted for blood pressure management.  Past Medical History:  Diagnosis Date   A-fib [facility])    Anxiety    CAD S/P percutaneous coronary angioplasty 1990s, 11/28/2011    a) midRCA PCI 1999 (NIR BMS 3.0 mm x 32 mm); distal  RCA BMS Sept 2012 (Vision BMS 2.5 mm x 15 mm); 12/02: RCA ISR --> Cutting PTCA    Coronary stent restenosis due to progression of disease 11/30/2011   Dyslipidemia, goal LDL below 70 11/28/2011   Essential hypertension    On ACE inhibitor and ARB? As well as atenolol and hydralazine   Gastric ulcer 11/2018   GERD (gastroesophageal reflux disease)    History of blood transfusion    without reaction   Moderate aortic stenosis by prior echocardiogram 08/2011; 08/2013   Echo 11/09/18: EF 60-65%. Gr 2 DD.  No RWMA.  Mild-Mod AS (mean gradient 19 mmHg). Mild-Mod PAH. ->  Follow-up echo March 2021 showed mean gradient 17 mmHg (dimensionless index 0.32)-this was reported as moderate-severe AS   Myocardial infarction (HCC) 1990s   Had PTCA then PCI on RCA; 11/2011--> Dyspnea and Shoulder pain prior to adm    Non-ST elevation MI (NSTEMI) (HCC) 11/28/2011    Dyspnea and Shoulder pain prior to adm   Type 2 diabetes mellitus with complication, without long-term current use of insulin (HCC)    no longer taking diabetic medication - diet controlled    Past Surgical History:  Procedure Laterality Date   ABDOMINAL HYSTERECTOMY     BIOPSY  06/03/2018   Procedure: BIOPSY;  Surgeon: Malissa Hippo, MD;  Location: [facility];  Service: Endoscopy;;  gastric    BIOPSY  09/15/2022   Procedure: BIOPSY;  Surgeon: Lanelle Bal, DO;  Location: [facility];  Service: Endoscopy;;   BREAST BIOPSY     CARDIAC CATHETERIZATION  2012   performed for angina   CARDIAC CATHETERIZATION     CARDIOVERSION N/A 10/24/2020   Procedure: CARDIOVERSION;  Surgeon: Jake Bathe, MD;  Location: [facility];  Service: Cardiovascular;  Laterality: N/A;   CORONARY ANGIOPLASTY  11/2006   Cutting Balloon of stent in the RCA   ESOPHAGOGASTRODUODENOSCOPY N/A 06/03/2018   Procedure: ESOPHAGOGASTRODUODENOSCOPY (EGD);  Surgeon: Malissa Hippo, MD;  Location: [facility];  Service: Endoscopy;  Laterality: N/A;  1:40   ESOPHAGOGASTRODUODENOSCOPY N/A 12/02/2018   Procedure: ESOPHAGOGASTRODUODENOSCOPY (EGD);  Surgeon: Malissa Hippo, MD;  Location: [facility];  Service: Endoscopy;  Laterality: N/A;  255   ESOPHAGOGASTRODUODENOSCOPY (EGD) WITH PROPOFOL N/A 09/15/2022   Procedure: ESOPHAGOGASTRODUODENOSCOPY (EGD) WITH PROPOFOL;  Surgeon: Lanelle Bal, DO;  Location: [facility];  Service: Endoscopy;  Laterality: N/A;  1:00pm, asa 3   LEFT HEART CATHETERIZATION WITH CORONARY ANGIOGRAM N/A 11/27/2011   Procedure: LEFT HEART CATHETERIZATION WITH CORONARY ANGIOGRAM;  Surgeon: Marykay Lex, MD;  Location: [facility] CATH LAB;  Service: Cardiovascular;  Laterality: N/A;   PERCUTANEOUS CORONARY STENT INTERVENTION (PCI-S)  1999   Mid RCA - NIR DMS 3.0 mm x 30 mm, dRCA Vision BMS 2.5 mm x 15 mm    TRANSTHORACIC ECHOCARDIOGRAM  10/2018   EF 60-65%. Gr 2 DD.  No RWMA.  Mild-Mod AS   (mean gradient 19 mmHg). Mild-Mod PAH.   TRANSTHORACIC ECHOCARDIOGRAM  03/01/2020   EF 70 to 75%.  No or WMA.  Mild LVH.  GRII DD with severe LA enlargement.  Mildly elevated PAP.  Mild MR.  Moderate AS (mean gradient 17 mmHg) with dimensionless index of 0.32.   TUBAL LIGATION       Home Medications:  Prior to Admission medications   Medication Sig Start Date End Date Taking? Authorizing Provider  apixaban (ELIQUIS) 2.5 MG TABS tablet Take 1 tablet (2.5 mg total) by mouth 2 (two) times daily. 09/01/20 11/17/22 Yes Jacalyn Lefevre, MD  atorvastatin (LIPITOR) 20 MG tablet Take 20 mg by mouth at bedtime.    Yes [provider]  benazepril (LOTENSIN) 40 MG tablet Take 1 tablet (40 mg total) by mouth daily. Patient taking differently: Take 30 mg by mouth daily. 05/01/22  Yes Rakes, Doralee Albino, FNP  carvedilol (COREG) 25 MG tablet TAKE ONE TABLET BY MOUTH TWICE DAILY Patient taking differently: Take 12.5 mg by mouth 2 (two) times daily. 09/12/22  Yes Rakes, Doralee Albino, FNP  hydrALAZINE (APRESOLINE) 100 MG tablet TAKE ONE (1) TABLET BY MOUTH TWO (2) TIMES DAILY 09/12/22  Yes Rakes, Doralee Albino, FNP  acetaminophen (TYLENOL) 500 MG tablet Take 1,000 mg by mouth every 6 (six) hours as needed for moderate pain.    [provider]  diclofenac Sodium (VOLTAREN) 1 % GEL Apply 2-4 g topically 2 (two) times daily as needed (back/hand pain.).  07/25/19   [provider]  diphenhydramine-acetaminophen (TYLENOL PM) 25-500 MG TABS tablet Take 1 tablet by mouth at bedtime.    [provider]  doxycycline (VIBRAMYCIN) 100 MG capsule Take by mouth. 11/14/22 11/21/22  [provider]  furosemide (LASIX) 40 MG tablet Take 1 tablet (40 mg total) by mouth daily as needed for fluid. 08/06/22   Sonny Masters, FNP  hydrocortisone (ANUSOL-HC) 2.5 % rectal cream Place 1 Application rectally 2 (two) times daily. 11/17/22   Tiffany Kocher, PA-C  NITROSTAT 0.4 MG SL tablet DISSOLVE 1 TAB UNDER TOUNGE  FOR CHEST PAIN. MAY REPEAT EVERY 5 MINUTES FOR 3 DOSES. IF NO RELIEF CALL 911 OR GO TO ER 11/24/16   Marykay Lex, MD  pantoprazole (PROTONIX) 40 MG tablet Take 1 tablet (40 mg total) by mouth 2 (two) times daily before a meal. 09/02/22   Tiffany Kocher, PA-C  sertraline (ZOLOFT) 25 MG tablet TAKE ONE (1) TABLET BY MOUTH EVERY DAY 09/16/22   Rakes, Doralee Albino, FNP  traMADol (ULTRAM) 50 MG tablet TAKE 1 TABLET BY MOUTH EVERY 6 HOURS AS NEEDED FOR PAIN 09/08/22   Erick Colace, MD    Inpatient Medications: Scheduled Meds:  benazepril  40 mg Oral Daily   carvedilol  25 mg Oral BID WC   Continuous Infusions:  PRN Meds:   Allergies:    Allergies  Allergen Reactions   Valium Nausea And  Vomiting    Social History:   Social History   Socioeconomic History   Marital status: Widowed    Spouse name: Channing Mutters   Number of children: 3   Years of education: Not on file   Highest education level: Not on file  Occupational History   Occupation: Retired Engineer, civil (consulting)  Tobacco Use   Smoking status: Former   Smokeless tobacco: Never   Tobacco comments:    Quit approximately 1993  Vaping Use   Vaping Use: Never used  Substance and Sexual Activity   Alcohol use: No   Drug use: No   Sexual activity: Not Currently    Birth control/protection: Post-menopausal  Other Topics Concern   Not on file  Social History Narrative   She is a married mother of 3, 2 daughters, 1 son who all live out of state but talks to weekly.   Step-son and his family live next door. Sees often.    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    Quit smoking in '99, denies alcohol.   She volunteers for [employer].   Social Determinants of Health   Financial Resource Strain: Low Risk  (06/18/2022)   Overall Financial Resource Strain (CARDIA)    Difficulty of Paying Living Expenses: Not hard at all  Food Insecurity: No Food Insecurity (06/18/2022)   Hunger Vital Sign    Worried About Running Out of Food in the  Last Year: Never true    Ran Out of Food in the Last Year: Never true  Transportation Needs: No Transportation Needs (06/18/2022)   PRAPARE - Administrator, Civil Service (Medical): No    Lack of Transportation (Non-Medical): No  Physical Activity: Inactive (06/18/2022)   Exercise Vital Sign    Days of Exercise per Week: 0 days    Minutes of Exercise per Session: 0 min  Stress: No Stress Concern Present (06/18/2022)   [employer] of Occupational Health - Occupational Stress Questionnaire    Feeling of Stress : Only a little  Social Connections: Moderately Integrated (06/18/2022)   Social Connection and Isolation Panel [NHANES]    Frequency of Communication with Friends and Family: More than three times a week    Frequency of Social Gatherings with Friends and Family: More than three times a week    Attends Religious Services: 1 to 4 times per year    Active Member of [employer] or Organizations: No    Attends Banker Meetings: Never    Marital Status: Married  Catering manager Violence: Not At Risk (06/18/2022)   Humiliation, Afraid, Rape, and Kick questionnaire    Fear of Current or Ex-Partner: No    Emotionally Abused: No    Physically Abused: No    Sexually Abused: No    Family History:    Family History  Problem Relation Age of Onset   Cancer Mother    Heart disease Father    Heart disease Maternal Grandmother    Heart disease Maternal Grandfather    Heart disease Brother    Nephrolithiasis Brother        accidental death   Heart Problems Brother        CABG     ROS:  Please see the history of present illness.  ROS  All other ROS reviewed and negative.     Physical Exam/Data:   Vitals:   11/17/22 1533 11/17/22 1600 11/17/22 1750 11/17/22 1800  BP: (!) 161/89 (!) 140/72 (!) 181/92 Marland Kitchen)  191/90  Pulse: 88 81 82 86  Resp: 14 16 20 14   Temp:      TempSrc:      SpO2: 99% 98% 97% 97%  Weight:      Height:       No intake or output data in  the 24 hours ending 11/17/22 1818 Filed Weights   11/17/22 1138  Weight: 63.5 kg   Body mass index is 22.94 kg/m.  General:  Well nourished, well developed, in no acute distress HEENT: normal Lymph: no adenopathy Neck: no JVD Endocrine:  No thryomegaly Vascular: No carotid bruits; FA pulses 2+ bilaterally without bruits  Cardiac: Systolic murmur with no S2 Lungs:  clear to auscultation bilaterally, no wheezing, rhonchi or rales  Abd: soft, nontender, no hepatomegaly  Ext: no edema Musculoskeletal:  No deformities, BUE and BLE strength normal and equal Skin: warm and dry  Neuro:  CNs 2-12 intact, no focal abnormalities noted Psych:  Normal affect   EKG:  The EKG was personally reviewed and demonstrates: A-fib Telemetry:  Telemetry was personally reviewed and demonstrates: A-fib  Relevant CV Studies: Echo from 05/2022 LVEF 65 to 70% LV diastolic function could not be evaluated RV systolic function is mildly reduced RA and LA severely dilated Moderate TR, mild MR and severe AS  Laboratory Data:  Chemistry Recent Labs  Lab 11/12/22 1549 11/17/22 1158  NA 145* 142  K 3.7 3.4*  CL 105 106  CO2 24 27  GLUCOSE 99 125*  BUN 19 18  CREATININE 0.87 0.63  CALCIUM 9.2 9.3  GFRNONAA  --  >60  ANIONGAP  --  9    No results for input(s): "PROT", "ALBUMIN", "AST", "ALT", "ALKPHOS", "BILITOT" in the last 168 hours. Hematology Recent Labs  Lab 11/12/22 1549 11/17/22 1158  WBC 5.1 4.8  RBC 3.58* 3.59*  HGB 9.9* 10.9*  HCT 30.9* 32.2*  MCV 86 89.7  MCH 27.7 30.4  MCHC 32.0 33.9  RDW 13.5 14.7  PLT 211 245   Cardiac EnzymesNo results for input(s): "TROPONINI" in the last 168 hours. No results for input(s): "TROPIPOC" in the last 168 hours.  BNPNo results for input(s): "BNP", "PROBNP" in the last 168 hours.  DDimer No results for input(s): "DDIMER" in the last 168 hours.  Radiology/Studies:  US Abdomen Complete  Result Date: 11/17/2022 CLINICAL DATA:  Abdominal  pain. EXAM: ABDOMEN ULTRASOUND COMPLETE COMPARISON:  None Available. FINDINGS: Gallbladder: Marked wall thickening, 7 mm. No gallstones or sonographic Murphy sign. Common bile duct: Diameter: 5 mm, within normal limits. No intrahepatic biliary ductal dilatation. Liver: Liver is heterogeneous in echotexture and the margin is irregular. Portal vein is patent on color Doppler imaging with normal direction of blood flow towards the liver. IVC: No abnormality visualized. Pancreas: Poorly visualized. Spleen: Size and appearance within normal limits. Right Kidney: Length: 9.8 cm. Mild pelviectasis. No overt obstruction or mass. Left Kidney: Length: 11.3 cm. Echogenicity within normal limits. No mass or hydronephrosis visualized. Abdominal aorta: No aneurysm visualized. Other findings: At least moderate ascites. There may be a right pleural effusion. IMPRESSION: 1. Cirrhosis with at least moderate ascites. 2. Marked gallbladder wall thickening may be related to hypoproteinemia. Electronically Signed   By: Leanna BattlesMelinda  Blietz M.D.   On: 11/17/2022 14:13   DG Chest 2 View  Result Date: 11/17/2022 CLINICAL DATA:  Pt had endoscopy in September and has since had epigastri/mid upper abd pain that is worse with being hungry. Better after eating. EXAM: CHEST - 2 VIEW COMPARISON:  CXR 09/01/20 FINDINGS: The heart size and mediastinal contours are within normal limits. Small bilateral pleural effusions. Bibasilar atelectasis. Prominent cardiac contours, unchanged from prior. The visualized skeletal structures are unremarkable. Severe calcifications of the aortic arch. IMPRESSION: Small bilateral pleural effusions and bibasilar atelectasis. Electronically Signed   By: Lorenza Cambridge M.D.   On: 11/17/2022 13:39    Assessment and Plan:   Patient is a 86 year old F known to have CAD status post RCA PCI, severe aortic valve stenosis, chronic A-fib, HTN, DM 2, diastolic CHF was sent from the GI clinic for evaluation of elevated blood  pressures, 220s over 110 mmHg.  # Hypertensive urgency -Patient did not take her blood pressure medications this morning. She was administered all her home antihypertensive medications this afternoon with a drop in blood pressures to 140 to 160 mmHg SBP (which is her baseline). -No further recommendations in the hospital. Continue her home antihypertensive medications.  # Severe aortic valve stenosis, symptomatic -She is scheduled for RHC and LHC on 11/24/2022. Keep the appointment. Continue preop workup for TAVR and follow-up with her primary cardiologist. No further recommendations.  # Chronic A-fib -Continue carvedilol 12.5 mg twice daily -Continue Eliquis 2.5 mg twice daily  I have spent a total of 55 minutes with patient reviewing chart , telemetry, EKGs, labs and examining patient as well as establishing an assessment and plan that was discussed with the patient.  > 50% of time was spent in direct patient care.     For questions or updates, please contact [facility] Please consult [email or web address] for contact info under Cardiology/STEMI.   Signed, Herbert Deaner, MD 11/17/2022 6:18 PM

## 2022-11-17 NOTE — H&P (View-Only) (Signed)
CARDIOLOGY CONSULT NOTE    Patient ID: [name]; [id number]; [birth year]   Admit date: 11/17/2022 Date of Consult: 11/17/2022  Primary Care Provider: Sonny Masters, FNP Primary Cardiologist: Dr. Herbie Baltimore, MD Primary Electrophysiologist: None   Patient Profile:   [name] is a 86 y.o. female with a hx of severe symptomatic D2 aortic valve stenosis, CAD with a RCA PCI, HTN, DM, diastolic CHF, chronic A-fib who is being seen today for the evaluation of HTN management at the request of April Cullens, DO.Marland Kitchen  History of Present Illness:   [name] is a 86 year old F known to have CAD status post RCA PCI, severe AS, HTN, DM, diastolic CHF, chronic A-fib was sent from GI clinic due to elevated blood pressures, 220s over 110 mmHg. Patient did not take her blood pressure medications this morning. Cardiology was consulted for blood pressure management.  Past Medical History:  Diagnosis Date   A-fib [facility])    Anxiety    CAD S/P percutaneous coronary angioplasty 1990s, 11/28/2011    a) midRCA PCI 1999 (NIR BMS 3.0 mm x 32 mm); distal  RCA BMS Sept 2012 (Vision BMS 2.5 mm x 15 mm); 12/02: RCA ISR --> Cutting PTCA    Coronary stent restenosis due to progression of disease 11/30/2011   Dyslipidemia, goal LDL below 70 11/28/2011   Essential hypertension    On ACE inhibitor and ARB? As well as atenolol and hydralazine   Gastric ulcer 11/2018   GERD (gastroesophageal reflux disease)    History of blood transfusion    without reaction   Moderate aortic stenosis by prior echocardiogram 08/2011; 08/2013   Echo 11/09/18: EF 60-65%. Gr 2 DD.  No RWMA.  Mild-Mod AS (mean gradient 19 mmHg). Mild-Mod PAH. ->  Follow-up echo March 2021 showed mean gradient 17 mmHg (dimensionless index 0.32)-this was reported as moderate-severe AS   Myocardial infarction (HCC) 1990s   Had PTCA then PCI on RCA; 11/2011--> Dyspnea and Shoulder pain prior to adm    Non-ST elevation MI (NSTEMI) (HCC) 11/28/2011    Dyspnea and Shoulder pain prior to adm   Type 2 diabetes mellitus with complication, without long-term current use of insulin (HCC)    no longer taking diabetic medication - diet controlled    Past Surgical History:  Procedure Laterality Date   ABDOMINAL HYSTERECTOMY     BIOPSY  06/03/2018   Procedure: BIOPSY;  Surgeon: Malissa Hippo, MD;  Location: [facility];  Service: Endoscopy;;  gastric    BIOPSY  09/15/2022   Procedure: BIOPSY;  Surgeon: Lanelle Bal, DO;  Location: [facility];  Service: Endoscopy;;   BREAST BIOPSY     CARDIAC CATHETERIZATION  2012   performed for angina   CARDIAC CATHETERIZATION     CARDIOVERSION N/A 10/24/2020   Procedure: CARDIOVERSION;  Surgeon: Jake Bathe, MD;  Location: [facility];  Service: Cardiovascular;  Laterality: N/A;   CORONARY ANGIOPLASTY  11/2006   Cutting Balloon of stent in the RCA   ESOPHAGOGASTRODUODENOSCOPY N/A 06/03/2018   Procedure: ESOPHAGOGASTRODUODENOSCOPY (EGD);  Surgeon: Malissa Hippo, MD;  Location: [facility];  Service: Endoscopy;  Laterality: N/A;  1:40   ESOPHAGOGASTRODUODENOSCOPY N/A 12/02/2018   Procedure: ESOPHAGOGASTRODUODENOSCOPY (EGD);  Surgeon: Malissa Hippo, MD;  Location: [facility];  Service: Endoscopy;  Laterality: N/A;  255   ESOPHAGOGASTRODUODENOSCOPY (EGD) WITH PROPOFOL N/A 09/15/2022   Procedure: ESOPHAGOGASTRODUODENOSCOPY (EGD) WITH PROPOFOL;  Surgeon: Lanelle Bal, DO;  Location: [facility];  Service: Endoscopy;  Laterality: N/A;  1:00pm, asa 3   LEFT HEART CATHETERIZATION WITH CORONARY ANGIOGRAM N/A 11/27/2011   Procedure: LEFT HEART CATHETERIZATION WITH CORONARY ANGIOGRAM;  Surgeon: Marykay Lex, MD;  Location: [facility] CATH LAB;  Service: Cardiovascular;  Laterality: N/A;   PERCUTANEOUS CORONARY STENT INTERVENTION (PCI-S)  1999   Mid RCA - NIR DMS 3.0 mm x 30 mm, dRCA Vision BMS 2.5 mm x 15 mm    TRANSTHORACIC ECHOCARDIOGRAM  10/2018   EF 60-65%. Gr 2 DD.  No RWMA.  Mild-Mod AS   (mean gradient 19 mmHg). Mild-Mod PAH.   TRANSTHORACIC ECHOCARDIOGRAM  03/01/2020   EF 70 to 75%.  No or WMA.  Mild LVH.  GRII DD with severe LA enlargement.  Mildly elevated PAP.  Mild MR.  Moderate AS (mean gradient 17 mmHg) with dimensionless index of 0.32.   TUBAL LIGATION       Home Medications:  Prior to Admission medications   Medication Sig Start Date End Date Taking? Authorizing Provider  apixaban (ELIQUIS) 2.5 MG TABS tablet Take 1 tablet (2.5 mg total) by mouth 2 (two) times daily. 09/01/20 11/17/22 Yes Jacalyn Lefevre, MD  atorvastatin (LIPITOR) 20 MG tablet Take 20 mg by mouth at bedtime.    Yes [provider]  benazepril (LOTENSIN) 40 MG tablet Take 1 tablet (40 mg total) by mouth daily. Patient taking differently: Take 30 mg by mouth daily. 05/01/22  Yes Rakes, Doralee Albino, FNP  carvedilol (COREG) 25 MG tablet TAKE ONE TABLET BY MOUTH TWICE DAILY Patient taking differently: Take 12.5 mg by mouth 2 (two) times daily. 09/12/22  Yes Rakes, Doralee Albino, FNP  hydrALAZINE (APRESOLINE) 100 MG tablet TAKE ONE (1) TABLET BY MOUTH TWO (2) TIMES DAILY 09/12/22  Yes Rakes, Doralee Albino, FNP  acetaminophen (TYLENOL) 500 MG tablet Take 1,000 mg by mouth every 6 (six) hours as needed for moderate pain.    [provider]  diclofenac Sodium (VOLTAREN) 1 % GEL Apply 2-4 g topically 2 (two) times daily as needed (back/hand pain.).  07/25/19   [provider]  diphenhydramine-acetaminophen (TYLENOL PM) 25-500 MG TABS tablet Take 1 tablet by mouth at bedtime.    [provider]  doxycycline (VIBRAMYCIN) 100 MG capsule Take by mouth. 11/14/22 11/21/22  [provider]  furosemide (LASIX) 40 MG tablet Take 1 tablet (40 mg total) by mouth daily as needed for fluid. 08/06/22   April Masters, FNP  hydrocortisone (ANUSOL-HC) 2.5 % rectal cream Place 1 Application rectally 2 (two) times daily. 11/17/22   Tiffany Kocher, PA-C  NITROSTAT 0.4 MG SL tablet DISSOLVE 1 TAB UNDER TOUNGE  FOR CHEST PAIN. MAY REPEAT EVERY 5 MINUTES FOR 3 DOSES. IF NO RELIEF CALL 911 OR GO TO ER 11/24/16   Marykay Lex, MD  pantoprazole (PROTONIX) 40 MG tablet Take 1 tablet (40 mg total) by mouth 2 (two) times daily before a meal. 09/02/22   Tiffany Kocher, PA-C  sertraline (ZOLOFT) 25 MG tablet TAKE ONE (1) TABLET BY MOUTH EVERY DAY 09/16/22   Rakes, Doralee Albino, FNP  traMADol (ULTRAM) 50 MG tablet TAKE 1 TABLET BY MOUTH EVERY 6 HOURS AS NEEDED FOR PAIN 09/08/22   Erick Colace, MD    Inpatient Medications: Scheduled Meds:  benazepril  40 mg Oral Daily   carvedilol  25 mg Oral BID WC   Continuous Infusions:  PRN Meds:   Allergies:    Allergies  Allergen Reactions   Valium Nausea And  Vomiting    Social History:   Social History   Socioeconomic History   Marital status: Widowed    Spouse name: Channing Mutters   Number of children: 3   Years of education: Not on file   Highest education level: Not on file  Occupational History   Occupation: Retired Engineer, civil (consulting)  Tobacco Use   Smoking status: Former   Smokeless tobacco: Never   Tobacco comments:    Quit approximately 1993  Vaping Use   Vaping Use: Never used  Substance and Sexual Activity   Alcohol use: No   Drug use: No   Sexual activity: Not Currently    Birth control/protection: Post-menopausal  Other Topics Concern   Not on file  Social History Narrative   She is a married mother of 3, 2 daughters, 1 son who all live out of state but talks to weekly.   Step-son and his family live next door. Sees often.    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    Quit smoking in '99, denies alcohol.   She volunteers for [employer].   Social Determinants of Health   Financial Resource Strain: Low Risk  (06/18/2022)   Overall Financial Resource Strain (CARDIA)    Difficulty of Paying Living Expenses: Not hard at all  Food Insecurity: No Food Insecurity (06/18/2022)   Hunger Vital Sign    Worried About Running Out of Food in the  Last Year: Never true    Ran Out of Food in the Last Year: Never true  Transportation Needs: No Transportation Needs (06/18/2022)   PRAPARE - Administrator, Civil Service (Medical): No    Lack of Transportation (Non-Medical): No  Physical Activity: Inactive (06/18/2022)   Exercise Vital Sign    Days of Exercise per Week: 0 days    Minutes of Exercise per Session: 0 min  Stress: No Stress Concern Present (06/18/2022)   [employer] of Occupational Health - Occupational Stress Questionnaire    Feeling of Stress : Only a little  Social Connections: Moderately Integrated (06/18/2022)   Social Connection and Isolation Panel [NHANES]    Frequency of Communication with Friends and Family: More than three times a week    Frequency of Social Gatherings with Friends and Family: More than three times a week    Attends Religious Services: 1 to 4 times per year    Active Member of [employer] or Organizations: No    Attends Banker Meetings: Never    Marital Status: Married  Catering manager Violence: Not At Risk (06/18/2022)   Humiliation, Afraid, Rape, and Kick questionnaire    Fear of Current or Ex-Partner: No    Emotionally Abused: No    Physically Abused: No    Sexually Abused: No    Family History:    Family History  Problem Relation Age of Onset   Cancer Mother    Heart disease Father    Heart disease Maternal Grandmother    Heart disease Maternal Grandfather    Heart disease Brother    Nephrolithiasis Brother        accidental death   Heart Problems Brother        CABG     ROS:  Please see the history of present illness.  ROS  All other ROS reviewed and negative.     Physical Exam/Data:   Vitals:   11/17/22 1533 11/17/22 1600 11/17/22 1750 11/17/22 1800  BP: (!) 161/89 (!) 140/72 (!) 181/92 Marland Kitchen)  191/90  Pulse: 88 81 82 86  Resp: 14 16 20 14   Temp:      TempSrc:      SpO2: 99% 98% 97% 97%  Weight:      Height:       No intake or output data in  the 24 hours ending 11/17/22 1818 Filed Weights   11/17/22 1138  Weight: 63.5 kg   Body mass index is 22.94 kg/m.  General:  Well nourished, well developed, in no acute distress HEENT: normal Lymph: no adenopathy Neck: no JVD Endocrine:  No thryomegaly Vascular: No carotid bruits; FA pulses 2+ bilaterally without bruits  Cardiac: Systolic murmur with no S2 Lungs:  clear to auscultation bilaterally, no wheezing, rhonchi or rales  Abd: soft, nontender, no hepatomegaly  Ext: no edema Musculoskeletal:  No deformities, BUE and BLE strength normal and equal Skin: warm and dry  Neuro:  CNs 2-12 intact, no focal abnormalities noted Psych:  Normal affect   EKG:  The EKG was personally reviewed and demonstrates: A-fib Telemetry:  Telemetry was personally reviewed and demonstrates: A-fib  Relevant CV Studies: Echo from 05/2022 LVEF 65 to 70% LV diastolic function could not be evaluated RV systolic function is mildly reduced RA and LA severely dilated Moderate TR, mild MR and severe AS  Laboratory Data:  Chemistry Recent Labs  Lab 11/12/22 1549 11/17/22 1158  NA 145* 142  K 3.7 3.4*  CL 105 106  CO2 24 27  GLUCOSE 99 125*  BUN 19 18  CREATININE 0.87 0.63  CALCIUM 9.2 9.3  GFRNONAA  --  >60  ANIONGAP  --  9    No results for input(s): "PROT", "ALBUMIN", "AST", "ALT", "ALKPHOS", "BILITOT" in the last 168 hours. Hematology Recent Labs  Lab 11/12/22 1549 11/17/22 1158  WBC 5.1 4.8  RBC 3.58* 3.59*  HGB 9.9* 10.9*  HCT 30.9* 32.2*  MCV 86 89.7  MCH 27.7 30.4  MCHC 32.0 33.9  RDW 13.5 14.7  PLT 211 245   Cardiac EnzymesNo results for input(s): "TROPONINI" in the last 168 hours. No results for input(s): "TROPIPOC" in the last 168 hours.  BNPNo results for input(s): "BNP", "PROBNP" in the last 168 hours.  DDimer No results for input(s): "DDIMER" in the last 168 hours.  Radiology/Studies:  US Abdomen Complete  Result Date: 11/17/2022 CLINICAL DATA:  Abdominal  pain. EXAM: ABDOMEN ULTRASOUND COMPLETE COMPARISON:  None Available. FINDINGS: Gallbladder: Marked wall thickening, 7 mm. No gallstones or sonographic Murphy sign. Common bile duct: Diameter: 5 mm, within normal limits. No intrahepatic biliary ductal dilatation. Liver: Liver is heterogeneous in echotexture and the margin is irregular. Portal vein is patent on color Doppler imaging with normal direction of blood flow towards the liver. IVC: No abnormality visualized. Pancreas: Poorly visualized. Spleen: Size and appearance within normal limits. Right Kidney: Length: 9.8 cm. Mild pelviectasis. No overt obstruction or mass. Left Kidney: Length: 11.3 cm. Echogenicity within normal limits. No mass or hydronephrosis visualized. Abdominal aorta: No aneurysm visualized. Other findings: At least moderate ascites. There may be a right pleural effusion. IMPRESSION: 1. Cirrhosis with at least moderate ascites. 2. Marked gallbladder wall thickening may be related to hypoproteinemia. Electronically Signed   By: Leanna BattlesMelinda  Blietz M.D.   On: 11/17/2022 14:13   DG Chest 2 View  Result Date: 11/17/2022 CLINICAL DATA:  Pt had endoscopy in September and has since had epigastri/mid upper abd pain that is worse with being hungry. Better after eating. EXAM: CHEST - 2 VIEW COMPARISON:  CXR 09/01/20 FINDINGS: The heart size and mediastinal contours are within normal limits. Small bilateral pleural effusions. Bibasilar atelectasis. Prominent cardiac contours, unchanged from prior. The visualized skeletal structures are unremarkable. Severe calcifications of the aortic arch. IMPRESSION: Small bilateral pleural effusions and bibasilar atelectasis. Electronically Signed   By: Hemant  Desai M.D.   On: 11/17/2022 13:39    Assessment and Plan:   Patient is a 86-year-old F known to have CAD status post RCA PCI, severe aortic valve stenosis, chronic A-fib, HTN, DM 2, diastolic CHF was sent from the GI clinic for evaluation of elevated blood  pressures, 220s over 110 mmHg.  # Hypertensive urgency -Patient did not take her blood pressure medications this morning. She was administered all her home antihypertensive medications this afternoon with a drop in blood pressures to 140 to 160 mmHg SBP (which is her baseline). -No further recommendations in the hospital. Continue her home antihypertensive medications.  # Severe aortic valve stenosis, symptomatic -She is scheduled for RHC and LHC on 11/24/2022. Keep the appointment. Continue preop workup for TAVR and follow-up with her primary cardiologist. No further recommendations.  # Chronic A-fib -Continue carvedilol 12.5 mg twice daily -Continue Eliquis 2.5 mg twice daily  I have spent a total of 55 minutes with patient reviewing chart , telemetry, EKGs, labs and examining patient as well as establishing an assessment and plan that was discussed with the patient.  > 50% of time was spent in direct patient care.     For questions or updates, please contact [facility] Please consult [email or web address] for contact info under Cardiology/STEMI.   Signed, Terralyn Matsumura Priya Jasean Ambrosia, MD 11/17/2022 6:18 PM  

## 2022-11-17 NOTE — ED Notes (Signed)
PA advised of bp and to possibly give her meds she hasn't taken this am.

## 2022-11-17 NOTE — Progress Notes (Addendum)
GI Office Note    Referring Provider: Sonny Masters, FNP Primary Care Physician:  Sonny Masters, FNP  Primary Gastroenterologist: Roetta Sessions, MD   Chief Complaint   Chief Complaint  Patient presents with   Follow-up    Having issues with a lot of bloating and burping.     History of Present Illness   [name] is a 86 y.o. female presenting today for follow-up.  Last seen in September.  Presented at that time with black stools and personal history of gastric ulcer (2019 using BC powders at that time).  She is on Eliquis for A-fib. Normal Hgb in 02/2022. In 07/2022 noted to have Hgb 10.7. recently Hgb 9.9. Hemoccult positive stool 07/2022.   At time of her office visit she complained of several day history of epigastric pain, worse with movement.  Better if she ate.  Having a lot of heartburn and indigestion.  Taking Alka-Seltzer.  Complaining of a lot of stress related to husband's chronic illness and recent passing.    Today: She feels terrible. No energy. She is on doxycycline for toe infection. She is supposed to have heart cath 12/4 but may have to postpone. She is having some swelling in her feet. Her weight is up 3 pounds in past 5 days. Up 17 pounds since 09/2022.   No black stools since 08/2022. Started having rectal pain about six weeks ago. Stools have been irregular. About a month or so ago, she had to take laxatives for constipation. This seems to have resolved now but her stools are soft, and loose. But some days may not have any stools. BM 2-3 times some days. Depends on what eating. Some fecal incontinence associated with urgency and can't get there in time. Having ongoing epigastric pain. Better when she eats. No postprandial abdominal pain. Has a lot of gas and burping. Feels bloated in lower abdomen. This feels better when passes gas or stool. No heartburn. No longer on carafate. No brbpr. No melena.   BP very high today. States she hasn't taken her BP  medications today but is compliant.  EGD September 2023: - Z-line regular, 39 cm from the incisors. - Gastritis. Biopsied. - Normal duodenal bulb, first portion of the duodenum and second portion of the duodenum. -Consider capsule endoscopy if hemoglobin drops less than 10 -Reactive gastropathy, no H. Pylori  EGD 11/2018: - Normal esophagus. Healed esophagitis. - Z-line irregular, 37 cm from the incisors. - 2 cm hiatal hernia. - Erythematous mucosa in the antrum. - Two scars in the gastric antrum indicative of healed ulcers. - Normal duodenal bulb and second portion of the duodenum. - No specimens collected.   EGD 05/2018 - LA Grade A reflux esophagitis. - 3 cm hiatal hernia. - Non-bleeding gastric ulcers. Biopsied. No H.pylori - Erosive gastropathy. - Normal duodenal bulb and second portion of the duodenum.  Colonoscopy 2013: -normal  Medications   Current Outpatient Medications  Medication Sig Dispense Refill   acetaminophen (TYLENOL) 500 MG tablet Take 1,000 mg by mouth every 6 (six) hours as needed for moderate pain.     apixaban (ELIQUIS) 2.5 MG TABS tablet Take 1 tablet (2.5 mg total) by mouth 2 (two) times daily. 60 tablet 0   atorvastatin (LIPITOR) 20 MG tablet Take 20 mg by mouth at bedtime.      benazepril (LOTENSIN) 40 MG tablet Take 1 tablet (40 mg total) by mouth daily. (Patient taking differently: Take 20 mg by mouth  2 (two) times daily.) 90 tablet 3   carvedilol (COREG) 25 MG tablet TAKE ONE TABLET BY MOUTH TWICE DAILY 180 tablet 3   diclofenac Sodium (VOLTAREN) 1 % GEL Apply 2-4 g topically 2 (two) times daily as needed (back/hand pain.).      diphenhydramine-acetaminophen (TYLENOL PM) 25-500 MG TABS tablet Take 1 tablet by mouth at bedtime.     doxycycline (VIBRAMYCIN) 100 MG capsule Take by mouth.     furosemide (LASIX) 40 MG tablet Take 1 tablet (40 mg total) by mouth daily as needed for fluid. 30 tablet 3   hydrALAZINE (APRESOLINE) 100 MG tablet TAKE ONE  (1) TABLET BY MOUTH TWO (2) TIMES DAILY 180 tablet 1   NITROSTAT 0.4 MG SL tablet DISSOLVE 1 TAB UNDER TOUNGE FOR CHEST PAIN. MAY REPEAT EVERY 5 MINUTES FOR 3 DOSES. IF NO RELIEF CALL 911 OR GO TO ER 25 tablet 3   pantoprazole (PROTONIX) 40 MG tablet Take 1 tablet (40 mg total) by mouth 2 (two) times daily before a meal. 60 tablet 3   sertraline (ZOLOFT) 25 MG tablet TAKE ONE (1) TABLET BY MOUTH EVERY DAY 90 tablet 0   traMADol (ULTRAM) 50 MG tablet TAKE 1 TABLET BY MOUTH EVERY 6 HOURS AS NEEDED FOR PAIN 30 tablet 1   No current facility-administered medications for this visit.    Allergies   Allergies as of 11/17/2022 - Review Complete 11/17/2022  Allergen Reaction Noted   Valium Nausea And Vomiting 11/27/2011     Past Medical History   Past Medical History:  Diagnosis Date   A-fib [facility](HCC)    Anxiety    CAD S/P percutaneous coronary angioplasty 1990s, 11/28/2011    a) midRCA PCI 1999 (NIR BMS 3.0 mm x 32 mm); distal  RCA BMS Sept 2012 (Vision BMS 2.5 mm x 15 mm); 12/02: RCA ISR --> Cutting PTCA    Coronary stent restenosis due to progression of disease 11/30/2011   Dyslipidemia, goal LDL below 70 11/28/2011   Essential hypertension    On ACE inhibitor and ARB? As well as atenolol and hydralazine   Gastric ulcer 11/2018   GERD (gastroesophageal reflux disease)    History of blood transfusion    without reaction   Moderate aortic stenosis by prior echocardiogram 08/2011; 08/2013   Echo 11/09/18: EF 60-65%. Gr 2 DD.  No RWMA.  Mild-Mod AS (mean gradient 19 mmHg). Mild-Mod PAH. ->  Follow-up echo March 2021 showed mean gradient 17 mmHg (dimensionless index 0.32)-this was reported as moderate-severe AS   Myocardial infarction (HCC) 1990s   Had PTCA then PCI on RCA; 11/2011--> Dyspnea and Shoulder pain prior to adm    Non-ST elevation MI (NSTEMI) (HCC) 11/28/2011   Dyspnea and Shoulder pain prior to adm   Type 2 diabetes mellitus with complication, without long-term current use of insulin  (HCC)    no longer taking diabetic medication - diet controlled    Past Surgical History   Past Surgical History:  Procedure Laterality Date   ABDOMINAL HYSTERECTOMY     BIOPSY  06/03/2018   Procedure: BIOPSY;  Surgeon: Malissa Hippoehman, Najeeb U, MD;  Location: [facility];  Service: Endoscopy;;  gastric    BIOPSY  09/15/2022   Procedure: BIOPSY;  Surgeon: Lanelle Balarver, Charles K, DO;  Location: [facility];  Service: Endoscopy;;   BREAST BIOPSY     CARDIAC CATHETERIZATION  2012   performed for angina   CARDIAC CATHETERIZATION     CARDIOVERSION N/A 10/24/2020   Procedure:  CARDIOVERSION;  Surgeon: Jake Bathe, MD;  Location: [facility] END[facility];  Service: Cardiovascular;  Laterality: N/A;   CORONARY ANGIOPLASTY  11/2006   Cutting Balloon of stent in the RCA   ESOPHAGOGASTRODUODENOSCOPY N/A 06/03/2018   Procedure: ESOPHAGOGASTRODUODENOSCOPY (EGD);  Surgeon: Malissa Hippo, MD;  Location: [facility];  Service: Endoscopy;  Laterality: N/A;  1:40   ESOPHAGOGASTRODUODENOSCOPY N/A 12/02/2018   Procedure: ESOPHAGOGASTRODUODENOSCOPY (EGD);  Surgeon: Malissa Hippo, MD;  Location: [facility];  Service: Endoscopy;  Laterality: N/A;  255   ESOPHAGOGASTRODUODENOSCOPY (EGD) WITH PROPOFOL N/A 09/15/2022   Procedure: ESOPHAGOGASTRODUODENOSCOPY (EGD) WITH PROPOFOL;  Surgeon: Lanelle Bal, DO;  Location: [facility];  Service: Endoscopy;  Laterality: N/A;  1:00pm, asa 3   LEFT HEART CATHETERIZATION WITH CORONARY ANGIOGRAM N/A 11/27/2011   Procedure: LEFT HEART CATHETERIZATION WITH CORONARY ANGIOGRAM;  Surgeon: Marykay Lex, MD;  Location: [facility] CATH LAB;  Service: Cardiovascular;  Laterality: N/A;   PERCUTANEOUS CORONARY STENT INTERVENTION (PCI-S)  1999   Mid RCA - NIR DMS 3.0 mm x 30 mm, dRCA Vision BMS 2.5 mm x 15 mm    TRANSTHORACIC ECHOCARDIOGRAM  10/2018   EF 60-65%. Gr 2 DD.  No RWMA.  Mild-Mod AS  (mean gradient 19 mmHg). Mild-Mod PAH.   TRANSTHORACIC ECHOCARDIOGRAM  03/01/2020   EF 70 to 75%.   No or WMA.  Mild LVH.  GRII DD with severe LA enlargement.  Mildly elevated PAP.  Mild MR.  Moderate AS (mean gradient 17 mmHg) with dimensionless index of 0.32.   TUBAL LIGATION      Past Family History   Family History  Problem Relation Age of Onset   Cancer Mother    Heart disease Father    Heart disease Maternal Grandmother    Heart disease Maternal Grandfather    Heart disease Brother    Nephrolithiasis Brother        accidental death   Heart Problems Brother        CABG    Past Social History   Social History   Socioeconomic History   Marital status: Widowed    Spouse name: Channing Mutters   Number of children: 3   Years of education: Not on file   Highest education level: Not on file  Occupational History   Occupation: Retired Engineer, civil (consulting)  Tobacco Use   Smoking status: Former   Smokeless tobacco: Never   Tobacco comments:    Quit approximately 1993  Vaping Use   Vaping Use: Never used  Substance and Sexual Activity   Alcohol use: No   Drug use: No   Sexual activity: Not Currently    Birth control/protection: Post-menopausal  Other Topics Concern   Not on file  Social History Narrative   She is a married mother of 3, 2 daughters, 1 son who all live out of state but talks to weekly.   Step-son and his family live next door. Sees often.    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    Quit smoking in '99, denies alcohol.   She volunteers for [employer].   Social Determinants of Health   Financial Resource Strain: Low Risk  (06/18/2022)   Overall Financial Resource Strain (CARDIA)    Difficulty of Paying Living Expenses: Not hard at all  Food Insecurity: No Food Insecurity (06/18/2022)   Hunger Vital Sign    Worried About Running Out of Food in the Last Year: Never true    Ran Out of  Food in the Last Year: Never true  Transportation Needs: No Transportation Needs (06/18/2022)   PRAPARE - Administrator, Civil Service (Medical): No    Lack of  Transportation (Non-Medical): No  Physical Activity: Inactive (06/18/2022)   Exercise Vital Sign    Days of Exercise per Week: 0 days    Minutes of Exercise per Session: 0 min  Stress: No Stress Concern Present (06/18/2022)   [employer] of Occupational Health - Occupational Stress Questionnaire    Feeling of Stress : Only a little  Social Connections: Moderately Integrated (06/18/2022)   Social Connection and Isolation Panel [NHANES]    Frequency of Communication with Friends and Family: More than three times a week    Frequency of Social Gatherings with Friends and Family: More than three times a week    Attends Religious Services: 1 to 4 times per year    Active Member of [employer] or Organizations: No    Attends Banker Meetings: Never    Marital Status: Married  Catering manager Violence: Not At Risk (06/18/2022)   Humiliation, Afraid, Rape, and Kick questionnaire    Fear of Current or Ex-Partner: No    Emotionally Abused: No    Physically Abused: No    Sexually Abused: No    Review of Systems   General: Negative for anorexia, weight loss, fever, chills, +fatigue, +weakness. ENT: Negative for hoarseness, difficulty swallowing , nasal congestion. CV: Negative for chest pain, angina, palpitations, dyspnea on exertion, +peripheral edema.  Respiratory: Negative for dyspnea at rest, dyspnea on exertion, cough, sputum, wheezing.  GI: See history of present illness. GU:  Negative for dysuria, hematuria, urinary incontinence, urinary frequency, nocturnal urination.  Endo: Negative for unusual weight change.     Physical Exam   BP (!) 210/97 (BP Location: Right Arm, Patient Position: Sitting, Cuff Size: Normal)   Pulse (!) 101   Temp 98.4 F (36.9 C) (Oral)   Ht 5' 5.5" (1.664 m)   Wt 140 lb 12.8 oz (63.9 kg)   SpO2 98%   BMI 23.07 kg/m    General: appears like she feels unwell. Accompanied by daughter. She is pale. No acute distress.  Eyes: No icterus. Mouth:  Oropharyngeal mucosa moist and pink , no lesions erythema or exudate. Lungs: Clear to auscultation bilaterally.  Heart: Regular rate and rhythm, no murmurs rubs or gallops.  Abdomen: Bowel sounds are normal,  nondistended, no hepatosplenomegaly or masses,  no abdominal bruits or hernia , no rebound or guarding. Mild epigastric tenderness. Prominent pulsatile aorta above umbilicus.  Rectal: small nonbleeding hemorrhoid just at anal opening. No overt blood noted on DRE. Brown secretions heme +. Nontender exam. No masses.  Extremities: No lower extremity edema. No clubbing or deformities. Neuro: Alert and oriented x 4   Skin: Warm and dry, no jaundice.   Psych: Alert and cooperative, normal mood and affect.  Labs   Lab Results  Component Value Date   CREATININE 0.87 11/12/2022   BUN 19 11/12/2022   NA 145 (H) 11/12/2022   K 3.7 11/12/2022   CL 105 11/12/2022   CO2 24 11/12/2022   Lab Results  Component Value Date   WBC 5.1 11/12/2022   HGB 9.9 (L) 11/12/2022   HCT 30.9 (L) 11/12/2022   MCV 86 11/12/2022   PLT 211 11/12/2022   Lab Results  Component Value Date   ALT 14 08/06/2022   AST 18 08/06/2022   ALKPHOS 59 08/06/2022   BILITOT 0.4  08/06/2022     Imaging Studies   No results found.  Assessment   Anemia/Heme positive stools: recent EGD reassuring. Last melena couple of months ago. She remains heme positive on exam but this could be related to hemorrhoids. No black stool or overt blood noted on exam today. She will likely require additional work up, consider updating colonoscopy or small bowel capsule study but currently she is scheduled for several cardiac tests and today having significant elevation of her BP which needs to be addressed. Will need to monitor H/H closely.   Elevated blood pressure: her BPs have been much better on several EPIC encounters. Today, BP remains significantly elevated while in the office. She has not taken her medications this morning. Given  her anemia, swelling, heart issues, I have suggested she go to the ER for evaluation and management of her BP.   Prominent pulsatile aorta: no prior abdominal imaging. Recommend u/s imaging as soon as possible. If not done while in the ER today, then we can arrange for this week.   Hemorrhoid: mild discomfort recently.    PLAN   Go to ER for BP management.  Abdominal u/s to evaluate both upper abdominal pain and prominent pulsatile aorta. If not done in the ER today, we will arrange for this week.  Start ferrous sulfate 325mg  BID.  Continue pantoprazole BID. Anusol cream BID prn.  We will continue to monitor H/H. Recheck in four weeks unless done sooner during cardiac work up.  She should report current toe infection to cardiology as it may affect upcoming cardiac cath.  . Leanna Battles, MHS, PA-C [facility]

## 2022-11-17 NOTE — ED Provider Notes (Signed)
[facility] EMERGENCY DEPARTMENT Provider Note   CSN: [id number] Arrival date & time: 11/17/22  1015     History  Chief Complaint  Patient presents with   Hypertension    [name] is a 86 y.o. female with a past medical history of A-fib on Eliquis, CAD status post percutaneous coronary angioplasty, hypertension, gastric ulcer, aortic stenosis, type 2 diabetes presenting to the emergency department for evaluation of hypertension.  Patient was seen as an GI office for follow-up of her upper endoscopy when she was found to be hypertensive of 220s over 100.  States she has not taken her hypertensive medications today however she has been compliant.  Echo done on 6/30 showed normal left ventricular ejection fraction, mitral valve regurgitation, tricuspid valve regurgitation, severe calcification of the aortic valve.  She is scheduled for 12/4 for a cardiac catheterization. Denies any chest pain, shortness of breath, headache, dizziness, vision changes, bowel changes, urinary symptoms.   Hypertension   Past Medical History:  Diagnosis Date   A-fib [facility])    Anxiety    CAD S/P percutaneous coronary angioplasty 1990s, 11/28/2011    a) midRCA PCI 1999 (NIR BMS 3.0 mm x 32 mm); distal  RCA BMS Sept 2012 (Vision BMS 2.5 mm x 15 mm); 12/02: RCA ISR --> Cutting PTCA    Coronary stent restenosis due to progression of disease 11/30/2011   Dyslipidemia, goal LDL below 70 11/28/2011   Essential hypertension    On ACE inhibitor and ARB? As well as atenolol and hydralazine   Gastric ulcer 11/2018   GERD (gastroesophageal reflux disease)    History of blood transfusion    without reaction   Moderate aortic stenosis by prior echocardiogram 08/2011; 08/2013   Echo 11/09/18: EF 60-65%. Gr 2 DD.  No RWMA.  Mild-Mod AS (mean gradient 19 mmHg). Mild-Mod PAH. ->  Follow-up echo March 2021 showed mean gradient 17 mmHg (dimensionless index 0.32)-this was reported as moderate-severe AS   Myocardial  infarction (HCC) 1990s   Had PTCA then PCI on RCA; 11/2011--> Dyspnea and Shoulder pain prior to adm    Non-ST elevation MI (NSTEMI) (HCC) 11/28/2011   Dyspnea and Shoulder pain prior to adm   Type 2 diabetes mellitus with complication, without long-term current use of insulin (HCC)    no longer taking diabetic medication - diet controlled       Home Medications Prior to Admission medications   Medication Sig Start Date End Date Taking? Authorizing Provider  apixaban (ELIQUIS) 2.5 MG TABS tablet Take 1 tablet (2.5 mg total) by mouth 2 (two) times daily. 09/01/20 11/17/22 Yes Jacalyn Lefevre, MD  atorvastatin (LIPITOR) 20 MG tablet Take 20 mg by mouth at bedtime.    Yes [provider]  benazepril (LOTENSIN) 40 MG tablet Take 1 tablet (40 mg total) by mouth daily. Patient taking differently: Take 30 mg by mouth daily. 05/01/22  Yes Rakes, Doralee Albino, FNP  carvedilol (COREG) 25 MG tablet TAKE ONE TABLET BY MOUTH TWICE DAILY Patient taking differently: Take 12.5 mg by mouth 2 (two) times daily. 09/12/22  Yes Rakes, Doralee Albino, FNP  hydrALAZINE (APRESOLINE) 100 MG tablet TAKE ONE (1) TABLET BY MOUTH TWO (2) TIMES DAILY 09/12/22  Yes Rakes, Doralee Albino, FNP  acetaminophen (TYLENOL) 500 MG tablet Take 1,000 mg by mouth every 6 (six) hours as needed for moderate pain.    [provider]  diclofenac Sodium (VOLTAREN) 1 % GEL Apply 2-4 g topically 2 (two) times daily as needed (  back/hand pain.).  07/25/19   [provider]  diphenhydramine-acetaminophen (TYLENOL PM) 25-500 MG TABS tablet Take 1 tablet by mouth at bedtime.    [provider]  doxycycline (VIBRAMYCIN) 100 MG capsule Take by mouth. 11/14/22 11/21/22  [provider]  furosemide (LASIX) 40 MG tablet Take 1 tablet (40 mg total) by mouth daily as needed for fluid. 08/06/22   Sonny Masters, FNP  hydrocortisone (ANUSOL-HC) 2.5 % rectal cream Place 1 Application rectally 2 (two) times daily. 11/17/22   Tiffany Kocher, PA-C  NITROSTAT 0.4 MG SL tablet DISSOLVE 1 TAB UNDER TOUNGE FOR CHEST PAIN. MAY REPEAT EVERY 5 MINUTES FOR 3 DOSES. IF NO RELIEF CALL 911 OR GO TO ER 11/24/16   Marykay Lex, MD  pantoprazole (PROTONIX) 40 MG tablet Take 1 tablet (40 mg total) by mouth 2 (two) times daily before a meal. 09/02/22   Tiffany Kocher, PA-C  sertraline (ZOLOFT) 25 MG tablet TAKE ONE (1) TABLET BY MOUTH EVERY DAY 09/16/22   Rakes, Doralee Albino, FNP  traMADol (ULTRAM) 50 MG tablet TAKE 1 TABLET BY MOUTH EVERY 6 HOURS AS NEEDED FOR PAIN 09/08/22   Kirsteins, Victorino Sparrow, MD      Allergies    Valium    Review of Systems   Review of Systems Negative except as per HPI. Physical Exam Updated Vital Signs BP (!) 191/90   Pulse 86   Temp 98.4 F (36.9 C) (Oral)   Resp 14   Ht 5' 5.5" (1.664 m)   Wt 63.5 kg   SpO2 97%   BMI 22.94 kg/m  Physical Exam Vitals and nursing note reviewed.  Constitutional:      Appearance: Normal appearance.  HENT:     Head: Normocephalic and atraumatic.     Mouth/Throat:     Mouth: Mucous membranes are moist.  Eyes:     General: No scleral icterus. Cardiovascular:     Rate and Rhythm: Normal rate. Rhythm irregular.     Pulses: Normal pulses.     Heart sounds: Murmur heard.     No friction rub.  Pulmonary:     Effort: Pulmonary effort is normal.     Breath sounds: Normal breath sounds.  Abdominal:     General: Abdomen is flat.     Palpations: Abdomen is soft.     Tenderness: There is no abdominal tenderness.  Musculoskeletal:        General: No deformity.  Skin:    General: Skin is warm.     Findings: No rash.  Neurological:     General: No focal deficit present.     Mental Status: She is alert.  Psychiatric:        Mood and Affect: Mood normal.     ED Results / Procedures / Treatments   Labs (all labs ordered are listed, but only abnormal results are displayed) Labs Reviewed  CBC WITH DIFFERENTIAL/PLATELET - Abnormal; Notable for the following components:       Result Value   RBC 3.59 (*)    Hemoglobin 10.9 (*)    HCT 32.2 (*)    All other components within normal limits  BASIC METABOLIC PANEL - Abnormal; Notable for the following components:   Potassium 3.4 (*)    Glucose, Bld 125 (*)    All other components within normal limits  TROPONIN I (HIGH SENSITIVITY) - Abnormal; Notable for the following components:   Troponin I (High Sensitivity) 27 (*)    All  other components within normal limits  TROPONIN I (HIGH SENSITIVITY) - Abnormal; Notable for the following components:   Troponin I (High Sensitivity) 31 (*)    All other components within normal limits    EKG EKG Interpretation  Date/Time:  Monday November 17 2022 13:20:17 EST Ventricular Rate:  84 PR Interval:    QRS Duration: 82 QT Interval:  402 QTC Calculation: 475 R Axis:   89 Text Interpretation: Atrial fibrillation Nonspecific ST and T wave abnormality Abnormal ECG No previous ECGs available Confirmed by Tanda RockersGray, Samuel ([id number]) on 11/17/2022 3:15:20 PM  Radiology US Abdomen Complete  Result Date: 11/17/2022 CLINICAL DATA:  Abdominal pain. EXAM: ABDOMEN ULTRASOUND COMPLETE COMPARISON:  None Available. FINDINGS: Gallbladder: Marked wall thickening, 7 mm. No gallstones or sonographic Murphy sign. Common bile duct: Diameter: 5 mm, within normal limits. No intrahepatic biliary ductal dilatation. Liver: Liver is heterogeneous in echotexture and the margin is irregular. Portal vein is patent on color Doppler imaging with normal direction of blood flow towards the liver. IVC: No abnormality visualized. Pancreas: Poorly visualized. Spleen: Size and appearance within normal limits. Right Kidney: Length: 9.8 cm. Mild pelviectasis. No overt obstruction or mass. Left Kidney: Length: 11.3 cm. Echogenicity within normal limits. No mass or hydronephrosis visualized. Abdominal aorta: No aneurysm visualized. Other findings: At least moderate ascites. There may be a right pleural effusion.  IMPRESSION: 1. Cirrhosis with at least moderate ascites. 2. Marked gallbladder wall thickening may be related to hypoproteinemia. Electronically Signed   By: Leanna BattlesMelinda  Blietz M.D.   On: 11/17/2022 14:13   DG Chest 2 View  Result Date: 11/17/2022 CLINICAL DATA:  Pt had endoscopy in September and has since had epigastri/mid upper abd pain that is worse with being hungry. Better after eating. EXAM: CHEST - 2 VIEW COMPARISON:  CXR 09/01/20 FINDINGS: The heart size and mediastinal contours are within normal limits. Small bilateral pleural effusions. Bibasilar atelectasis. Prominent cardiac contours, unchanged from prior. The visualized skeletal structures are unremarkable. Severe calcifications of the aortic arch. IMPRESSION: Small bilateral pleural effusions and bibasilar atelectasis. Electronically Signed   By: Lorenza CambridgeHemant  Desai M.D.   On: 11/17/2022 13:39    Procedures Procedures    Medications Ordered in ED Medications  benazepril (LOTENSIN) tablet 40 mg (40 mg Oral Given 11/17/22 1419)  carvedilol (COREG) tablet 25 mg (25 mg Oral Given 11/17/22 1419)  hydrALAZINE (APRESOLINE) tablet 100 mg (100 mg Oral Given 11/17/22 1415)    ED Course/ Medical Decision Making/ A&P                           Medical Decision Making Amount and/or Complexity of Data Reviewed Radiology: ordered.  Risk Prescription drug management.   This patient presents to the ED for hypertension, this involves an extensive number of treatment options, and is a complaint that carries with a high risk of complications and morbidity.  The differential diagnosis includes medication noncompliance, ACS, hypertensive emergenc.  This is not an exhaustive list.  Comorbidities that complicate the patient evaluation See HPI  Social determinants of health NA  Additional history obtained: Additional history obtained from EMR. External records from outside source obtained and review including prior labs  Cardiac  monitoring/EKG: The patient was maintained on a cardiac monitor.  I personally reviewed and interpreted the cardiac monitor which showed an underlying rhythm of: Sinus rhythm.  Lab tests: I ordered and personally interpreted labs.  The pertinent results include: WBC unremarkable. Hbg unremarkable. Platelets unremarkable.  No electrolyte abnormalities noted. BUN, creatinine unremarkable. LFT unremarkable. Troponin 27 and 31.  Imaging studies: I ordered imaging studies including chest XR which showed small bilateral effusion. I personally reviewed, interpreted imaging and agree with the radiologist's interpretations.  Problem list/ ED course/ Critical interventions/ Medical management: HPI: See above Vital signs significant for BP 192/108 otherwise within normal range and stable throughout visit. Laboratory/imaging studies significant for: See above. On physical examination, patient is afebrile and appears in no acute distress.  Heart sounds with murmur and irregular rhythm which is consistent with her history of A-fib and aortic stenosis.  No tachycardia.  Blood pressure was elevated at 192/108.  Patient states she did not take her blood pressure medication this morning. I will give her home blood pressure medications in the ER and reevaluate.  EKG show A-fib with no ischemic changes.  Troponin elevated at 27 and 32.  Patient has no chest pain that would indicate an ACS with reassuring EKG and negative delta Trop. Patient's clinical presentations and laboratory/imaging studies are most concerned for hypotension.  Hydralazine, Lotensin, carvedilol ordered. Reevaluation of the patient after these medications showed that the patient improved.  Blood pressure at 1600 was 140/72.  Patient is still asymptomatic with no chest pain, shortness of breath. I have reviewed the patient home medicines and have made adjustments as needed. Advised patient to take her blood pressure medication as prescribed.   Follow-up with cardiology or PCP for further evaluation and management.  Return to the emergency room if new or worsening symptoms.  Consultations obtained: I requested consultation with cardiologist Dr. Jenene Slicker, and discussed lab and imaging findings as well as pertinent plan.  They recommend blood pressure control with her home medications.  Disposition Continued outpatient therapy. Follow-up with PCP and cardiology recommended for reevaluation of symptoms. Treatment plan discussed with patient.  Pt acknowledged understanding was agreeable to the plan. Worrisome signs and symptoms were discussed with patient, and patient acknowledged understanding to return to the ED if they noticed these signs and symptoms. Patient was stable upon discharge.   This chart was dictated using voice recognition software.  Despite best efforts to proofread,  errors can occur which can change the documentation meaning.          Final Clinical Impression(s) / ED Diagnoses Final diagnoses:  Primary hypertension    Rx / DC Orders ED Discharge Orders     None         Jeanelle Malling, [place] 11/17/22 2139    Tanda Rockers A, DO 11/18/22 1001

## 2022-11-17 NOTE — Discharge Instructions (Addendum)
Please take your blood pressure medications as prescribed. I recommend close follow-up with PCP and cardiology for reevaluation.  Please do not hesitate to return to emergency department if worrisome signs symptoms we discussed become apparent.

## 2022-11-17 NOTE — Telephone Encounter (Signed)
Noted  

## 2022-11-18 NOTE — Telephone Encounter (Signed)
Pt had abdominal ultrasound while she was in the ER yesterday.

## 2022-11-20 ENCOUNTER — Telehealth: Payer: Self-pay | Admitting: *Deleted

## 2022-11-20 NOTE — Telephone Encounter (Signed)
Cardiac Catheterization scheduled at [facility] for: Monday November 24, 2022 9 AM Arrival time and place: [facility] Main Entrance A at: 6:30 AM-needs BMP  Nothing to eat after midnight prior to procedure, clear liquids until 5 AM day of procedure.  Medication instructions: -Hold:  Eliquis-none 11/22/22 until post procedure  Lasix-none AM of procedure-pt reports she has not taken recently-only takes prn -Except hold medications usual morning medications can be taken with sips of water including aspirin 81 mg.  Confirmed patient has responsible adult to drive home post procedure and be with patient first 24 hours after arriving home.  Patient reports no new symptoms concerning for COVID-19 in the past 10 days.  Reviewed procedure instructions with patient.  Patient reports she has been checking BP regularly and it has been better-knows she can take all BP meds Monday AM before going to hospital.  K 3.4 in ED 11/17/22-pt will increase intake of foods high in potassium-pt aware will repeat BMP on arrival to Short Stay 11/24/22.

## 2022-11-24 ENCOUNTER — Other Ambulatory Visit: Payer: Self-pay

## 2022-11-24 ENCOUNTER — Encounter: Payer: Self-pay | Admitting: Physician Assistant

## 2022-11-24 ENCOUNTER — Other Ambulatory Visit: Payer: Self-pay | Admitting: Physician Assistant

## 2022-11-24 ENCOUNTER — Ambulatory Visit (HOSPITAL_COMMUNITY)
Admission: RE | Admit: 2022-11-24 | Discharge: 2022-11-24 | Disposition: A | Discharge status: Home / Self Care | Payer: Medicare Other | Attending: Cardiology | Admitting: Cardiology

## 2022-11-24 ENCOUNTER — Encounter (HOSPITAL_COMMUNITY)
Admission: RE | Disposition: A | Discharge status: Home / Self Care | Payer: Self-pay | Source: Home / Self Care | Attending: Cardiology

## 2022-11-24 DIAGNOSIS — I160 Hypertensive urgency: Secondary | ICD-10-CM | POA: Diagnosis not present

## 2022-11-24 DIAGNOSIS — E119 Type 2 diabetes mellitus without complications: Secondary | ICD-10-CM | POA: Insufficient documentation

## 2022-11-24 DIAGNOSIS — I5032 Chronic diastolic (congestive) heart failure: Secondary | ICD-10-CM | POA: Diagnosis not present

## 2022-11-24 DIAGNOSIS — I482 Chronic atrial fibrillation, unspecified: Secondary | ICD-10-CM | POA: Insufficient documentation

## 2022-11-24 DIAGNOSIS — I2510 Atherosclerotic heart disease of native coronary artery without angina pectoris: Secondary | ICD-10-CM | POA: Insufficient documentation

## 2022-11-24 DIAGNOSIS — Z9861 Coronary angioplasty status: Secondary | ICD-10-CM | POA: Diagnosis not present

## 2022-11-24 DIAGNOSIS — I350 Nonrheumatic aortic (valve) stenosis: Secondary | ICD-10-CM | POA: Insufficient documentation

## 2022-11-24 DIAGNOSIS — Z7901 Long term (current) use of anticoagulants: Secondary | ICD-10-CM | POA: Diagnosis not present

## 2022-11-24 DIAGNOSIS — I110 Hypertensive heart disease with heart failure: Secondary | ICD-10-CM | POA: Diagnosis not present

## 2022-11-24 DIAGNOSIS — Z79899 Other long term (current) drug therapy: Secondary | ICD-10-CM | POA: Diagnosis not present

## 2022-11-24 DIAGNOSIS — Z01812 Encounter for preprocedural laboratory examination: Secondary | ICD-10-CM

## 2022-11-24 HISTORY — PX: RIGHT HEART CATH AND CORONARY ANGIOGRAPHY: CATH118264

## 2022-11-24 LAB — POCT I-STAT EG7
Acid-Base Excess: 0 mmol/L (ref 0.0–2.0)
Acid-base deficit: 1 mmol/L (ref 0.0–2.0)
Bicarbonate: 23.9 mmol/L (ref 20.0–28.0)
Bicarbonate: 25.3 mmol/L (ref 20.0–28.0)
Calcium, Ion: 1.11 mmol/L — ABNORMAL LOW (ref 1.15–1.40)
Calcium, Ion: 1.2 mmol/L (ref 1.15–1.40)
HCT: 28 % — ABNORMAL LOW (ref 36.0–46.0)
HCT: 29 % — ABNORMAL LOW (ref 36.0–46.0)
Hemoglobin: 9.5 g/dL — ABNORMAL LOW (ref 12.0–15.0)
Hemoglobin: 9.9 g/dL — ABNORMAL LOW (ref 12.0–15.0)
O2 Saturation: 65 %
O2 Saturation: 65 %
Potassium: 3.1 mmol/L — ABNORMAL LOW (ref 3.5–5.1)
Potassium: 3.3 mmol/L — ABNORMAL LOW (ref 3.5–5.1)
Sodium: 142 mmol/L (ref 135–145)
Sodium: 144 mmol/L (ref 135–145)
TCO2: 25 mmol/L (ref 22–32)
TCO2: 27 mmol/L (ref 22–32)
pCO2, Ven: 39.9 mmHg — ABNORMAL LOW (ref 44–60)
pCO2, Ven: 41.9 mmHg — ABNORMAL LOW (ref 44–60)
pH, Ven: 7.385 (ref 7.25–7.43)
pH, Ven: 7.388 (ref 7.25–7.43)
pO2, Ven: 34 mmHg (ref 32–45)
pO2, Ven: 34 mmHg (ref 32–45)

## 2022-11-24 LAB — BASIC METABOLIC PANEL
Anion gap: 9 (ref 5–15)
BUN: 12 mg/dL (ref 8–23)
CO2: 28 mmol/L (ref 22–32)
Calcium: 9.3 mg/dL (ref 8.9–10.3)
Chloride: 105 mmol/L (ref 98–111)
Creatinine, Ser: 0.92 mg/dL (ref 0.44–1.00)
GFR, Estimated: >60 mL/min (ref >60)
Glucose, Bld: 129 mg/dL — ABNORMAL HIGH (ref 70–99)
Potassium: 3.5 mmol/L (ref 3.5–5.1)
Sodium: 142 mmol/L (ref 135–145)

## 2022-11-24 LAB — POCT I-STAT 7, (LYTES, BLD GAS, ICA,H+H)
Acid-Base Excess: 0 mmol/L (ref 0.0–2.0)
Bicarbonate: 24.3 mmol/L (ref 20.0–28.0)
Calcium, Ion: 1.19 mmol/L (ref 1.15–1.40)
HCT: 29 % — ABNORMAL LOW (ref 36.0–46.0)
Hemoglobin: 9.9 g/dL — ABNORMAL LOW (ref 12.0–15.0)
O2 Saturation: 98 %
Potassium: 3.4 mmol/L — ABNORMAL LOW (ref 3.5–5.1)
Sodium: 141 mmol/L (ref 135–145)
TCO2: 25 mmol/L (ref 22–32)
pCO2 arterial: 38.8 mmHg (ref 32–48)
pH, Arterial: 7.405 (ref 7.35–7.45)
pO2, Arterial: 100 mmHg (ref 83–108)

## 2022-11-24 LAB — GLUCOSE, CAPILLARY: Glucose-Capillary: 134 mg/dL — ABNORMAL HIGH (ref 70–99)

## 2022-11-24 SURGERY — RIGHT HEART CATH AND CORONARY ANGIOGRAPHY
Anesthesia: LOCAL

## 2022-11-24 MED ORDER — MIDAZOLAM HCL 2 MG/2ML IJ SOLN
INTRAMUSCULAR | Status: DC | PRN
  Administered 2022-11-24: 1 mg via INTRAVENOUS

## 2022-11-24 MED ORDER — SODIUM CHLORIDE 0.9 % WEIGHT BASED INFUSION: 1.0000 mL/kg/h | INTRAVENOUS | Status: DC

## 2022-11-24 MED ORDER — SODIUM CHLORIDE 0.9 % IV SOLN: 250.0000 mL | INTRAVENOUS | Status: DC | PRN

## 2022-11-24 MED ORDER — HYDRALAZINE HCL 20 MG/ML IJ SOLN: INTRAMUSCULAR | Status: DC | PRN

## 2022-11-24 MED ORDER — VERAPAMIL HCL 2.5 MG/ML IV SOLN
INTRAVENOUS | Status: AC
  Filled 2022-11-24: qty 2

## 2022-11-24 MED ORDER — HYDRALAZINE HCL 20 MG/ML IJ SOLN: 10.0000 mg | INTRAMUSCULAR | Status: DC | PRN

## 2022-11-24 MED ORDER — LABETALOL HCL 5 MG/ML IV SOLN
10.0000 mg | INTRAVENOUS | Status: DC | PRN
  Administered 2022-11-24: 10 mg via INTRAVENOUS

## 2022-11-24 MED ORDER — SODIUM CHLORIDE 0.9% FLUSH: 3.0000 mL | Freq: Two times a day (BID) | INTRAVENOUS | Status: DC

## 2022-11-24 MED ORDER — VERAPAMIL HCL 2.5 MG/ML IV SOLN
INTRAVENOUS | Status: DC | PRN
  Administered 2022-11-24: 10 mL via INTRA_ARTERIAL

## 2022-11-24 MED ORDER — LIDOCAINE HCL (PF) 1 % IJ SOLN
INTRAMUSCULAR | Status: DC | PRN
  Administered 2022-11-24 (×2): 2 mL via INTRADERMAL

## 2022-11-24 MED ORDER — FENTANYL CITRATE (PF) 100 MCG/2ML IJ SOLN
INTRAMUSCULAR | Status: AC
  Filled 2022-11-24: qty 2

## 2022-11-24 MED ORDER — ASPIRIN 81 MG PO CHEW: 81.0000 mg | CHEWABLE_TABLET | ORAL | Status: DC

## 2022-11-24 MED ORDER — ACETAMINOPHEN 325 MG PO TABS: 650.0000 mg | ORAL_TABLET | ORAL | Status: DC | PRN

## 2022-11-24 MED ORDER — IOHEXOL 350 MG/ML SOLN
INTRAVENOUS | Status: DC | PRN
  Administered 2022-11-24: 45 mL

## 2022-11-24 MED ORDER — SODIUM CHLORIDE 0.9% FLUSH: 3.0000 mL | INTRAVENOUS | Status: DC | PRN

## 2022-11-24 MED ORDER — ONDANSETRON HCL 4 MG/2ML IJ SOLN: 4.0000 mg | Freq: Four times a day (QID) | INTRAMUSCULAR | Status: DC | PRN

## 2022-11-24 MED ORDER — MIDAZOLAM HCL 2 MG/2ML IJ SOLN
INTRAMUSCULAR | Status: AC
  Filled 2022-11-24: qty 2

## 2022-11-24 MED ORDER — HEPARIN (PORCINE) IN NACL 1000-0.9 UT/500ML-% IV SOLN
INTRAVENOUS | Status: AC
  Filled 2022-11-24: qty 500

## 2022-11-24 MED ORDER — LABETALOL HCL 5 MG/ML IV SOLN
INTRAVENOUS | Status: DC | PRN
  Administered 2022-11-24: 10 mg via INTRAVENOUS

## 2022-11-24 MED ORDER — LABETALOL HCL 5 MG/ML IV SOLN
INTRAVENOUS | Status: AC
  Filled 2022-11-24: qty 4

## 2022-11-24 MED ORDER — HEPARIN SODIUM (PORCINE) 1000 UNIT/ML IJ SOLN
INTRAMUSCULAR | Status: AC
  Filled 2022-11-24: qty 10

## 2022-11-24 MED ORDER — FENTANYL CITRATE (PF) 100 MCG/2ML IJ SOLN
INTRAMUSCULAR | Status: DC | PRN
  Administered 2022-11-24: 25 ug via INTRAVENOUS

## 2022-11-24 MED ORDER — HEPARIN (PORCINE) IN NACL 1000-0.9 UT/500ML-% IV SOLN
INTRAVENOUS | Status: DC | PRN
  Administered 2022-11-24 (×2): 500 mL

## 2022-11-24 MED ORDER — LIDOCAINE HCL (PF) 1 % IJ SOLN
INTRAMUSCULAR | Status: AC
  Filled 2022-11-24: qty 30

## 2022-11-24 MED ORDER — SODIUM CHLORIDE 0.9 % WEIGHT BASED INFUSION
3.0000 mL/kg/h | INTRAVENOUS | Status: AC
  Administered 2022-11-24: 3 mL/kg/h via INTRAVENOUS

## 2022-11-24 SURGICAL SUPPLY — 17 items
CATH BALLN WEDGE 5F 110CM (CATHETERS) ×1 IMPLANT
CATH INFINITI MULTIPACK ANG 4F (CATHETERS) ×1 IMPLANT
CATH OPTITORQUE TIG 4.0 5F (CATHETERS) ×1 IMPLANT
DEVICE RAD TR BAND REGULAR (VASCULAR PRODUCTS) ×1 IMPLANT
GLIDESHEATH SLEND SS 6F .021 (SHEATH) ×1 IMPLANT
GUIDEWIRE ANGLED .035X150CM (WIRE) ×1 IMPLANT
GUIDEWIRE INQWIRE 1.5J.035X260 (WIRE) IMPLANT
INQWIRE 1.5J .035X260CM (WIRE) ×1
KIT HEART LEFT (KITS) ×2 IMPLANT
PACK CARDIAC CATHETERIZATION (CUSTOM PROCEDURE TRAY) ×2 IMPLANT
SHEATH GLIDE SLENDER 4/5FR (SHEATH) ×1 IMPLANT
SHEATH PINNACLE 5F 10CM (SHEATH) ×1 IMPLANT
SHEATH PROBE COVER 6X72 (BAG) ×1 IMPLANT
TRANSDUCER W/STOPCOCK (MISCELLANEOUS) ×2 IMPLANT
TUBING CIL FLEX 10 FLL-RA (TUBING) ×2 IMPLANT
WIRE EMERALD 3MM-J .035X150CM (WIRE) ×1 IMPLANT
WIRE HI TORQ VERSACORE-J 145CM (WIRE) ×1 IMPLANT

## 2022-11-24 NOTE — Progress Notes (Signed)
post-ambulation done w/ nurse Boneta Lucks, RN. States pt did well, no s/s of bleeding or hematoma . will continue to monitor.

## 2022-11-24 NOTE — Progress Notes (Signed)
Pt d/c by Nurse Boneta Lucks, all d/c instructions given written and verbal. Pt left in stable condition. All procedure site free of bleeding and/or hematoma. Piv also removed by nurse Boneta Lucks, RN.

## 2022-11-24 NOTE — Progress Notes (Signed)
Site area: Right groin a 5 french arterial sheath was removed  Site Prior to Removal:  Level 0  Pressure Applied For 20 MINUTES    Bedrest Beginning at 0955am X 4 hours  Manual:   Yes.    Patient Status During Pull:  stable  Post Pull Groin Site:  Level 0  Post Pull Instructions Given:  Yes.    Post Pull Pulses Present:  Yes.    Dressing Applied:  Yes.    Comments:

## 2022-11-24 NOTE — Progress Notes (Signed)
TR band removed, new dressing in place along w/ arm brace. No bleeding or hematoma noted  

## 2022-11-24 NOTE — Interval H&P Note (Signed)
History and Physical Interval Note:  11/24/2022 7:35 AM  [name]  has presented today for surgery, with the diagnosis of severe aortic stenosis.  The various methods of treatment have been discussed with the patient and family. After consideration of risks, benefits and other options for treatment, the patient has consented to  Procedure(s): RIGHT/LEFT HEART CATH AND CORONARY ANGIOGRAPHY (N/A) . PERCUTANEOUS CORONARY INTERVENTION  as a surgical intervention.  The patient's history has been reviewed, patient examined, no change in status, stable for surgery.  I have reviewed the patient's chart and labs.  Questions were answered to the patient's satisfaction.    Cath Lab Visit (complete for each Cath Lab visit)  Clinical Evaluation Leading to the Procedure:   ACS: No.  Non-ACS:    Anginal Classification: CCS III - vs CHF from AS  Anti-ischemic medical therapy: Maximal Therapy (2 or more classes of medications)  Non-Invasive Test Results: No non-invasive testing performed  Prior CABG: No previous CABG    Bryan Lemma

## 2022-11-25 ENCOUNTER — Encounter (HOSPITAL_COMMUNITY): Payer: Self-pay | Admitting: Cardiology

## 2022-11-25 NOTE — Telephone Encounter (Signed)
[name], touch base with patient. I reviewed records while in the ED last week. No aneurysm on ultrasound. Gallbladder wall slightly thickened but not clearly significant. Liver border nodular.   I would recommend follow up in 12/2022 after she has had time to complete cardiac testing. We will discuss anemia, heme pos stools, nodular liver border seen on u/s.   Please make arrangements for ov in 12/2022.

## 2022-11-26 ENCOUNTER — Other Ambulatory Visit: Payer: Self-pay | Admitting: Family Medicine

## 2022-11-27 ENCOUNTER — Telehealth: Payer: Self-pay

## 2022-11-27 NOTE — Telephone Encounter (Signed)
     Patient  visit on 11/27  at [facility] Have you been able to follow up with your primary care physician? Yes   The patient was or was not able to obtain any needed medicine or equipment. Yes   Are there diet recommendations that you are having difficulty following? Na   Patient expresses understanding of discharge instructions and education provided has no other needs at this time.  Yes      [name] [facility] Guide, [place], Care Management  [phone] [address], [place], [place] [address] Phone: [phone] Email: [email or web address]    

## 2022-11-28 NOTE — Telephone Encounter (Signed)
Pt was made aware and verbalized understanding. Routing to the front to get patient scheduled for an appt in January.

## 2022-12-01 NOTE — Progress Notes (Signed)
[facility] Quality Team Note  Name: [name] Date of Birth: 12/22/1935 MRN: [id number] Date: 12/01/2022  [facility] Quality Team has reviewed this patient's chart, please see recommendations below:  [facility] Quality Other; (KED: Kidney Health Evaluation Gap- Patient needs Urine Albumin Creatinine Ratio Test completed for gap closure. EGFR has already been completed). 

## 2022-12-03 ENCOUNTER — Ambulatory Visit (HOSPITAL_COMMUNITY)
Admission: RE | Admit: 2022-12-03 | Discharge: 2022-12-03 | Disposition: A | Discharge status: Home / Self Care | Payer: Medicare Other | Source: Ambulatory Visit | Attending: Physician Assistant | Admitting: Physician Assistant

## 2022-12-03 ENCOUNTER — Encounter (HOSPITAL_COMMUNITY): Payer: Self-pay

## 2022-12-03 DIAGNOSIS — I350 Nonrheumatic aortic (valve) stenosis: Secondary | ICD-10-CM

## 2022-12-03 DIAGNOSIS — Z01810 Encounter for preprocedural cardiovascular examination: Secondary | ICD-10-CM | POA: Diagnosis not present

## 2022-12-03 DIAGNOSIS — I708 Atherosclerosis of other arteries: Secondary | ICD-10-CM | POA: Diagnosis not present

## 2022-12-03 DIAGNOSIS — Z01818 Encounter for other preprocedural examination: Secondary | ICD-10-CM | POA: Diagnosis not present

## 2022-12-03 MED ORDER — IOHEXOL 350 MG/ML SOLN
95.0000 mL | Freq: Once | INTRAVENOUS | Status: AC | PRN
  Administered 2022-12-03: 95 mL via INTRAVENOUS

## 2022-12-03 MED ORDER — METOPROLOL TARTRATE 5 MG/5ML IV SOLN
INTRAVENOUS | Status: AC
  Filled 2022-12-03: qty 10

## 2022-12-03 MED ORDER — METOPROLOL TARTRATE 5 MG/5ML IV SOLN
10.0000 mg | Freq: Once | INTRAVENOUS | Status: AC
  Administered 2022-12-03: 10 mg via INTRAVENOUS

## 2022-12-03 MED ORDER — NITROGLYCERIN 0.4 MG SL SUBL
SUBLINGUAL_TABLET | SUBLINGUAL | Status: AC
  Filled 2022-12-03: qty 2

## 2022-12-04 ENCOUNTER — Telehealth: Payer: Self-pay

## 2022-12-04 NOTE — Telephone Encounter (Signed)
-----   Message from Clinton Sawyer sent at 12/01/2022  2:17 PM EST ----- Please Review [facility] Quality team note. Thank you

## 2022-12-04 NOTE — Telephone Encounter (Signed)
Attempted to contact patient - NA °

## 2022-12-08 ENCOUNTER — Other Ambulatory Visit: Payer: Self-pay | Admitting: Family Medicine

## 2022-12-09 ENCOUNTER — Inpatient Hospital Stay (HOSPITAL_COMMUNITY): Admit: 2022-12-09 | Payer: Medicare Other

## 2022-12-25 ENCOUNTER — Encounter: Payer: Self-pay | Admitting: Cardiothoracic Surgery

## 2022-12-25 ENCOUNTER — Institutional Professional Consult (permissible substitution) (INDEPENDENT_AMBULATORY_CARE_PROVIDER_SITE_OTHER): Payer: Medicare Other | Admitting: Cardiothoracic Surgery

## 2022-12-25 VITALS — BP 170/90 | HR 90 | Resp 20 | Ht 65.5 in | Wt 140.0 lb

## 2022-12-25 DIAGNOSIS — I350 Nonrheumatic aortic (valve) stenosis: Secondary | ICD-10-CM | POA: Diagnosis not present

## 2022-12-30 NOTE — Progress Notes (Unsigned)
[address]       [place] [address]             [phone]                    Phoua A Sabino [facility] Medical Record [id number] Date of Birth: 03/02/1936  Referring: Leonie Man, MD Primary Care: Baruch Gouty, FNP Primary Cardiologist: Glenetta Hew, MD  Chief Complaint:    Chief Complaint  Patient presents with   Aortic Stenosis    New patient consultation, review all studies     History of Present Illness:    HADASAH LAURANCE 87 y.o. female here for TAVR evaluation  Per cardiology H&P " persistent atrial fibrillation, CAD with prior PCI, chronic diastolic CHF, HTN, HLD, DM diet controlled and severe aortic stenosis who is here today as a new consult, referred by Dr. Ellyn Hack, for further discussion regarding her aortic stenosis and possible TAVR. She has CAD and had a bare metal stent placed in the RCA in 1999 and another distal bare metal stent placed in the distal RCA in 2012. Last cardiac cath in 2012 with severe in stent restenosis in distal RCA stent treated with cutting balloon angioplasty. She has been followed over the past year for severe aortic stenosis. Echo December 2022 with normal LV function, mild MR and severe low flow/low gradient AS with mean gradient 27 mmHg, AVA 0.8 cm2. She was called at that time and referral to the structural heart clinic was made but she had other family issues and did not schedule this appt. Most recent echo 06/20/22 with LVEF=65-70%, mild MR. Severe low flow/low gradient AS with mean gradient 27 mmHg, AVA 0.72 cm2, DI 0.23, SVI 34. She had a recent upper endoscopy and was found to have gastritis with no ulcer.    She tells me today that she has progressive fatigue and dyspnea. Occasional dizziness. She has chronic LE edema. She has no chest pain. She lives alone in [place], [place]. Her children live out of town but one child is usually with her almost all of the time. Her husband died of pancreatic cancer in  08-21-2022. She is a retired Marine scientist. She worked at [employer] and [employer] as well as in [place] and [place]. She has her own teeth. She has regular dental follow up. "  I confirmed she has become increasingly symptomatic from valvular heart disease.      Past Medical History:  Diagnosis Date   A-fib [facility])    Anxiety    CAD S/P percutaneous coronary angioplasty 1990s, 11/28/2011    a) midRCA PCI 1999 (NIR BMS 3.0 mm x 32 mm); distal  RCA BMS Sept 2012 (Vision BMS 2.5 mm x 15 mm); 12/02: RCA ISR --> Cutting PTCA    Coronary stent restenosis due to progression of disease 11/30/2011   Dyslipidemia, goal LDL below 70 11/28/2011   Essential hypertension    On ACE inhibitor and ARB? As well as atenolol and hydralazine   Gastric ulcer 11/2018   GERD (gastroesophageal reflux disease)    History of blood transfusion    without reaction   Moderate aortic stenosis by prior echocardiogram 08/2011; 08/2013   Echo 11/09/18: EF 60-65%. Gr 2 DD.  No RWMA.  Mild-Mod AS (mean gradient 19 mmHg). Mild-Mod PAH. ->  Follow-up echo March 2021 showed mean gradient 17 mmHg (dimensionless index 0.32)-this was reported as moderate-severe AS   Myocardial  infarction [facility]) 1990s   Had PTCA then PCI on RCA; 11/2011--> Dyspnea and Shoulder pain prior to adm    Non-ST elevation MI (NSTEMI) ([place]) 11/28/2011   Dyspnea and Shoulder pain prior to adm   Type 2 diabetes mellitus with complication, without long-term current use of insulin ([place])    no longer taking diabetic medication - diet controlled    Past Surgical History:  Procedure Laterality Date   ABDOMINAL HYSTERECTOMY     BIOPSY  06/03/2018   Procedure: BIOPSY;  Surgeon: Rogene Houston, MD;  [facility];  Service: Endoscopy;;  gastric    BIOPSY  09/15/2022   Procedure: BIOPSY;  Surgeon: Eloise Harman, DO;  [facility];  Service: Endoscopy;;   BREAST BIOPSY     CARDIAC CATHETERIZATION  2012   performed for angina    CARDIAC CATHETERIZATION     CARDIOVERSION N/A 10/24/2020   Procedure: CARDIOVERSION;  Surgeon: Jerline Pain, MD;  Location: [facility];  Service: Cardiovascular;  Laterality: N/A;   CORONARY ANGIOPLASTY  11/2006   Cutting Balloon of stent in the RCA   ESOPHAGOGASTRODUODENOSCOPY N/A 06/03/2018   Procedure: ESOPHAGOGASTRODUODENOSCOPY (EGD);  Surgeon: Rogene Houston, MD;  [facility];  Service: Endoscopy;  Laterality: N/A;  1:40   ESOPHAGOGASTRODUODENOSCOPY N/A 12/02/2018   Procedure: ESOPHAGOGASTRODUODENOSCOPY (EGD);  Surgeon: Rogene Houston, MD;  [facility];  Service: Endoscopy;  Laterality: N/A;  255   ESOPHAGOGASTRODUODENOSCOPY (EGD) WITH PROPOFOL N/A 09/15/2022   Procedure: ESOPHAGOGASTRODUODENOSCOPY (EGD) WITH PROPOFOL;  Surgeon: Eloise Harman, DO;  [facility];  Service: Endoscopy;  Laterality: N/A;  1:00pm, asa 3   LEFT HEART CATHETERIZATION WITH CORONARY ANGIOGRAM N/A 11/27/2011   Procedure: LEFT HEART CATHETERIZATION WITH CORONARY ANGIOGRAM;  Surgeon: Leonie Man, MD;  Location: [facility] CATH LAB;  Service: Cardiovascular;  Laterality: N/A;   PERCUTANEOUS CORONARY STENT INTERVENTION (PCI-S)  1999   Mid RCA - NIR DMS 3.0 mm x 30 mm, dRCA Vision BMS 2.5 mm x 15 mm    RIGHT HEART CATH AND CORONARY ANGIOGRAPHY N/A 11/24/2022   Procedure: RIGHT HEART CATH AND CORONARY ANGIOGRAPHY;  Surgeon: Leonie Man, MD;  Location: [facility] CV LAB;  Service: Cardiovascular;  Laterality: N/A;   TRANSTHORACIC ECHOCARDIOGRAM  10/2018   EF 60-65%. Gr 2 DD.  No RWMA.  Mild-Mod AS  (mean gradient 19 mmHg). Mild-Mod PAH.   TRANSTHORACIC ECHOCARDIOGRAM  03/01/2020   EF 70 to 75%.  No or WMA.  Mild LVH.  GRII DD with severe LA enlargement.  Mildly elevated PAP.  Mild MR.  Moderate AS (mean gradient 17 mmHg) with dimensionless index of 0.32.   TUBAL LIGATION      Family History  Problem Relation Age of Onset   Cancer Mother    Heart disease Father    Heart  disease Maternal Grandmother    Heart disease Maternal Grandfather    Heart disease Brother    Nephrolithiasis Brother        accidental death   Heart Problems Brother        CABG     Social History   Tobacco Use  Smoking Status Former  Smokeless Tobacco Never  Tobacco Comments   Quit approximately 1993    Social History   Substance and Sexual Activity  Alcohol Use No     Allergies  Allergen Reactions   Valium Nausea And Vomiting    Current Outpatient Medications  Medication Sig Dispense  Refill   acetaminophen (TYLENOL) 500 MG tablet Take 1,000 mg by mouth every 6 (six) hours as needed for moderate pain.     apixaban (ELIQUIS) 2.5 MG TABS tablet TAKE ONE (1) TABLET BY MOUTH TWO (2) TIMES DAILY 60 tablet 0   atorvastatin (LIPITOR) 20 MG tablet TAKE ONE (1) TABLET EACH DAY 90 tablet 2   benazepril (LOTENSIN) 40 MG tablet Take 1 tablet (40 mg total) by mouth daily. (Patient taking differently: Take 20 mg by mouth 2 (two) times daily.) 90 tablet 3   carvedilol (COREG) 25 MG tablet TAKE ONE TABLET BY MOUTH TWICE DAILY 180 tablet 3   diclofenac Sodium (VOLTAREN) 1 % GEL Apply 2-4 g topically 2 (two) times daily as needed (back/hand pain.).      diphenhydramine-acetaminophen (TYLENOL PM) 25-500 MG TABS tablet Take 2 tablets by mouth at bedtime as needed (sleep).     Ferrous Sulfate Dried (SLOW RELEASE IRON) 45 MG TBCR Take 45 mg by mouth daily.     furosemide (LASIX) 40 MG tablet Take 1 tablet (40 mg total) by mouth daily as needed for fluid. 30 tablet 3   hydrALAZINE (APRESOLINE) 100 MG tablet TAKE ONE (1) TABLET BY MOUTH TWO (2) TIMES DAILY 180 tablet 1   hydrocortisone (ANUSOL-HC) 2.5 % rectal cream Place 1 Application rectally 2 (two) times daily. 30 g 1   NITROSTAT 0.4 MG SL tablet DISSOLVE 1 TAB UNDER TOUNGE FOR CHEST PAIN. MAY REPEAT EVERY 5 MINUTES FOR 3 DOSES. IF NO RELIEF CALL 911 OR GO TO ER 25 tablet 3   pantoprazole (PROTONIX) 40 MG tablet Take 1 tablet (40 mg total)  by mouth 2 (two) times daily before a meal. 60 tablet 3   sertraline (ZOLOFT) 25 MG tablet TAKE ONE (1) TABLET BY MOUTH EVERY DAY 90 tablet 0   traMADol (ULTRAM) 50 MG tablet TAKE 1 TABLET BY MOUTH EVERY 6 HOURS AS NEEDED FOR PAIN 30 tablet 1   No current facility-administered medications for this visit.    ROS 14 point ROS reviewed and negative except as per HPI   PHYSICAL EXAMINATION: BP (!) 170/90   Pulse 90   Resp 20   Ht 5' 5.5" (1.664 m)   Wt 140 lb (63.5 kg)   SpO2 95% Comment: RA  BMI 22.94 kg/m   Gen: NAD Neuro: Alert and oriented CV: irreg, + systolic murmur Resp: Nonlaboured Abd: Soft, ntnd Extr: WWP  Diagnostic Studies & Laboratory data:     Recent Radiology Findings:   CT CORONARY MORPH W/CTA COR W/SCORE W/CA W/CM &/OR WO/CM  Addendum Date: 12/04/2022   ADDENDUM REPORT: 12/04/2022 08:56 ADDENDUM: Extracardiac findings are described separately under dictation for contemporaneously obtained CTA chest, abdomen and pelvis dated 12/03/2022. Please see that report for full description of relevant extracardiac findings. Electronically Signed   By: Vinnie Langton M.D.   On: 12/04/2022 08:56   Result Date: 12/04/2022 CLINICAL DATA:  Aortic Stenosis EXAM: Cardiac TAVR CT TECHNIQUE: The patient was scanned on a Siemens Force [id number] slice scanner. A 120 kV retrospective scan was triggered in the ascending thoracic aorta at 140 HU's. Gantry rotation speed was 250 msecs and collimation was .6 mm. No beta blockade or nitro were given. The 3D data set was reconstructed in 5% intervals of the R-R cycle. Systolic and diastolic phases were analyzed on a dedicated work station using MPR, MIP and VRT modes. The patient received 80 cc of contrast. FINDINGS: Aortic Valve: Tri leaflet calcified with score 2683  Aorta: Severe calcific atherosclerosis Normal arch vessels no aneurysm Sino-tubular Junction: 26 mm with circumferential calcium Ascending Thoracic Aorta: 30 mm Aortic Arch: 24 mm  Descending Thoracic Aorta: 22 mm Sinus of Valsalva Measurements: Non-coronary: 29.8 mm Right - coronary: 29.9 mm Left -   coronary: 29.1 mm Coronary Artery Height above Annulus: Left Main: 15.8 mm above annulus Right Coronary: 16.9 mm above annulus Virtual Basal Annulus Measurements: Maximum / Minimum Diameter: 24.5 mm x 17.6 mm Perimeter: 68.8 mm Area: 351 mm2 Coronary Arteries: Sufficient height above annulus for deployment Optimum Fluoroscopic Angle for Delivery: LAO 23 Cranial 7 degrees IMPRESSION: 1. Tri- leaflet calcified aortic valve with score 2683 2.  Normal ascending thoracic aorta 3.0 cm 3.  Coronary arteries sufficient height for deployment 4.  Annular area of 351 mm2 suitable for a 23 mm Sapien Ultra valve 5.  Membranous septal length 9.4 mm 6. Optimal angiographic angle for deployment LAO 23 Cranial 7 degrees Jenkins Rouge Electronically Signed: By: Jenkins Rouge M.D. On: 12/03/2022 11:29   CT ANGIO ABDOMEN PELVIS  W &/OR WO CONTRAST  Result Date: 12/04/2022 CLINICAL DATA:  87 year old female with history of aortic stenosis. Preprocedural study prior to potential transcatheter aortic valve replacement (TAVR) procedure. EXAM: CT ANGIOGRAPHY CHEST, ABDOMEN AND PELVIS TECHNIQUE: Multidetector CT imaging through the chest, abdomen and pelvis was performed using the standard protocol during bolus administration of intravenous contrast. Multiplanar reconstructed images and MIPs were obtained and reviewed to evaluate the vascular anatomy. RADIATION DOSE REDUCTION: This exam was performed according to the departmental dose-optimization program which includes automated exposure control, adjustment of the mA and/or kV according to patient size and/or use of iterative reconstruction technique. CONTRAST:  107mL OMNIPAQUE IOHEXOL 350 MG/ML SOLN COMPARISON:  No priors. FINDINGS: CTA CHEST FINDINGS Cardiovascular: Heart size is enlarged with biatrial dilatation (right greater than left). There is no significant  pericardial fluid, thickening or pericardial calcification. There is aortic atherosclerosis, as well as atherosclerosis of the great vessels of the mediastinum and the coronary arteries, including calcified atherosclerotic plaque in the left anterior descending, left circumflex and right coronary arteries. Thickening and calcification of the aortic valve. Mediastinum/Lymph Nodes: No pathologically enlarged mediastinal or hilar lymph nodes. Small to moderate hiatal hernia. No axillary lymphadenopathy. Lungs/Pleura: Moderate right and small left pleural effusions lying dependently with areas of passive subsegmental atelectasis in the lung bases bilaterally. No confluent consolidative airspace disease. No definite suspicious appearing pulmonary nodules or masses are noted. Musculoskeletal/Soft Tissues: There are no aggressive appearing lytic or blastic lesions noted in the visualized portions of the skeleton. CTA ABDOMEN AND PELVIS FINDINGS Hepatobiliary: Liver has a slightly shrunken appearance and nodular contour, indicative of underlying cirrhosis. No definite suspicious cystic or solid hepatic lesions are confidently identified on today's arterial phase examination. No intra or extrahepatic biliary ductal dilatation. Gallbladder is unremarkable in appearance. Pancreas: No pancreatic mass. No pancreatic ductal dilatation. No pancreatic or peripancreatic fluid collections or inflammatory changes. Spleen: Unremarkable. Adrenals/Urinary Tract: Bilateral kidneys and bilateral adrenal glands are normal in appearance. No hydroureteronephrosis. Urinary bladder is unremarkable in appearance. Stomach/Bowel: The appearance of the stomach is unremarkable. No pathologic dilatation of small bowel or colon. Normal appendix. Vascular/Lymphatic: Extensive atherosclerosis throughout the abdominal and pelvic vasculature, with vascular findings and measurements pertinent to potential TAVR procedure, as detailed below. Portal vein is  patent measuring 13 mm in the porta hepatis. No lymphadenopathy noted in the abdomen or pelvis. Reproductive: Status post hysterectomy. Ovaries are not confidently identified may be surgically absent  or atrophic. Other: Moderate volume of ascites.  No pneumoperitoneum. Musculoskeletal: There are no aggressive appearing lytic or blastic lesions noted in the visualized portions of the skeleton. VASCULAR MEASUREMENTS PERTINENT TO TAVR: AORTA: Minimal Aortic Diameter-11 x 10 mm Severity of Aortic Calcification-severe RIGHT PELVIS: Right Common Iliac Artery - Minimal Diameter-4.2 x 4.4 mm Tortuosity-mild Calcification-severe Right External Iliac Artery - Minimal Diameter-6.3 x 5.2 mm Tortuosity-moderate Calcification-mild Right Common Femoral Artery - Minimal Diameter-5.0 x 6.3 mm Tortuosity-mild Calcification-moderate LEFT PELVIS: Left Common Iliac Artery - Minimal Diameter-7.2 x 4.1 mm Tortuosity-mild Calcification-severe Left External Iliac Artery - Minimal Diameter-6.1 x 5.3 mm Tortuosity-mild Calcification-mild Left Common Femoral Artery - Minimal Diameter-6.3 x 5.8 mm Tortuosity-mild Calcification-moderate Review of the MIP images confirms the above findings. IMPRESSION: 1. Vascular findings and measurements pertinent to potential TAVR procedure, as detailed above. 2. Thickening and calcification of the aortic valve, compatible with reported clinical history of severe aortic stenosis. 3. Moderate right and small left pleural effusions with areas of passive subsegmental atelectasis in the lower lungs bilaterally (right greater than left). 4. Cardiomegaly with biatrial dilatation. 5. Aortic atherosclerosis, in addition to three-vessel coronary artery disease. 6. Cirrhosis. 7. Moderate volume of ascites. 8. Additional incidental findings, as above. Electronically Signed   By: Vinnie Langton M.D.   On: 12/04/2022 08:55   CT ANGIO CHEST AORTA W/CM & OR WO/CM  Result Date: 12/04/2022 CLINICAL DATA:  87 year old  female with history of aortic stenosis. Preprocedural study prior to potential transcatheter aortic valve replacement (TAVR) procedure. EXAM: CT ANGIOGRAPHY CHEST, ABDOMEN AND PELVIS TECHNIQUE: Multidetector CT imaging through the chest, abdomen and pelvis was performed using the standard protocol during bolus administration of intravenous contrast. Multiplanar reconstructed images and MIPs were obtained and reviewed to evaluate the vascular anatomy. RADIATION DOSE REDUCTION: This exam was performed according to the departmental dose-optimization program which includes automated exposure control, adjustment of the mA and/or kV according to patient size and/or use of iterative reconstruction technique. CONTRAST:  9mL OMNIPAQUE IOHEXOL 350 MG/ML SOLN COMPARISON:  No priors. FINDINGS: CTA CHEST FINDINGS Cardiovascular: Heart size is enlarged with biatrial dilatation (right greater than left). There is no significant pericardial fluid, thickening or pericardial calcification. There is aortic atherosclerosis, as well as atherosclerosis of the great vessels of the mediastinum and the coronary arteries, including calcified atherosclerotic plaque in the left anterior descending, left circumflex and right coronary arteries. Thickening and calcification of the aortic valve. Mediastinum/Lymph Nodes: No pathologically enlarged mediastinal or hilar lymph nodes. Small to moderate hiatal hernia. No axillary lymphadenopathy. Lungs/Pleura: Moderate right and small left pleural effusions lying dependently with areas of passive subsegmental atelectasis in the lung bases bilaterally. No confluent consolidative airspace disease. No definite suspicious appearing pulmonary nodules or masses are noted. Musculoskeletal/Soft Tissues: There are no aggressive appearing lytic or blastic lesions noted in the visualized portions of the skeleton. CTA ABDOMEN AND PELVIS FINDINGS Hepatobiliary: Liver has a slightly shrunken appearance and nodular  contour, indicative of underlying cirrhosis. No definite suspicious cystic or solid hepatic lesions are confidently identified on today's arterial phase examination. No intra or extrahepatic biliary ductal dilatation. Gallbladder is unremarkable in appearance. Pancreas: No pancreatic mass. No pancreatic ductal dilatation. No pancreatic or peripancreatic fluid collections or inflammatory changes. Spleen: Unremarkable. Adrenals/Urinary Tract: Bilateral kidneys and bilateral adrenal glands are normal in appearance. No hydroureteronephrosis. Urinary bladder is unremarkable in appearance. Stomach/Bowel: The appearance of the stomach is unremarkable. No pathologic dilatation of small bowel or colon. Normal appendix. Vascular/Lymphatic: Extensive atherosclerosis throughout the  abdominal and pelvic vasculature, with vascular findings and measurements pertinent to potential TAVR procedure, as detailed below. Portal vein is patent measuring 13 mm in the porta hepatis. No lymphadenopathy noted in the abdomen or pelvis. Reproductive: Status post hysterectomy. Ovaries are not confidently identified may be surgically absent or atrophic. Other: Moderate volume of ascites.  No pneumoperitoneum. Musculoskeletal: There are no aggressive appearing lytic or blastic lesions noted in the visualized portions of the skeleton. VASCULAR MEASUREMENTS PERTINENT TO TAVR: AORTA: Minimal Aortic Diameter-11 x 10 mm Severity of Aortic Calcification-severe RIGHT PELVIS: Right Common Iliac Artery - Minimal Diameter-4.2 x 4.4 mm Tortuosity-mild Calcification-severe Right External Iliac Artery - Minimal Diameter-6.3 x 5.2 mm Tortuosity-moderate Calcification-mild Right Common Femoral Artery - Minimal Diameter-5.0 x 6.3 mm Tortuosity-mild Calcification-moderate LEFT PELVIS: Left Common Iliac Artery - Minimal Diameter-7.2 x 4.1 mm Tortuosity-mild Calcification-severe Left External Iliac Artery - Minimal Diameter-6.1 x 5.3 mm Tortuosity-mild  Calcification-mild Left Common Femoral Artery - Minimal Diameter-6.3 x 5.8 mm Tortuosity-mild Calcification-moderate Review of the MIP images confirms the above findings. IMPRESSION: 1. Vascular findings and measurements pertinent to potential TAVR procedure, as detailed above. 2. Thickening and calcification of the aortic valve, compatible with reported clinical history of severe aortic stenosis. 3. Moderate right and small left pleural effusions with areas of passive subsegmental atelectasis in the lower lungs bilaterally (right greater than left). 4. Cardiomegaly with biatrial dilatation. 5. Aortic atherosclerosis, in addition to three-vessel coronary artery disease. 6. Cirrhosis. 7. Moderate volume of ascites. 8. Additional incidental findings, as above. Electronically Signed   By: Trudie Reed M.D.   On: 12/04/2022 08:55       I have independently reviewed the above radiology studies  and reviewed the findings with the patient.   Recent Lab Findings: Lab Results  Component Value Date   WBC 4.8 11/17/2022   HGB 9.9 (L) 11/24/2022   HCT 29.0 (L) 11/24/2022   PLT 245 11/17/2022   GLUCOSE 129 (H) 11/24/2022   CHOL 137 03/19/2022   TRIG 96 03/19/2022   HDL 69 03/19/2022   LDLCALC 50 03/19/2022   ALT 14 08/06/2022   AST 18 08/06/2022   NA 142 11/24/2022   K 3.3 (L) 11/24/2022   CL 105 11/24/2022   CREATININE 0.92 11/24/2022   BUN 12 11/24/2022   CO2 28 11/24/2022   TSH 2.344 09/04/2011   INR 1.17 09/04/2011   HGBA1C 5.4 08/06/2022     Assessment / Plan:   41F with severe symptomatic aortic stenosis.  Meets criteria for AV replacement.  STS risk is 5.7%.  With cirrhosis and moderate volume ascites it is possible the patient may not obtain complete relief from dyspnea with TAVR. This was discussed. INR of 1.1 and albumin 4.7 do not indicate synthetic function is compromised.   Risks/benefits/alternatives of TAVR were discussed at length (90% standard recovery, 9% morbidity  [any organ, typically access site vascular complication needing surgery, stroke, or pacemaker] and <1% mortality.   Valve: 23 Sapien (area 320mm2) Bailout: No (age) Access: Will need further discussion, I see RCIA of 4.2 x 4.4 near circumferentially calcified. The left common illiac appears to have a segment with >50% total lumen of calcium. May be blooming. Looking further with imaging staff will be of benefit.  Right and left [place] are both greater than 5.8cm. These would be an option as well.  NYHA:II   Misc/procedural: Hold eliquis prior to surgery  Waverly Ferrari Aileena Iglesia 12/30/2022 6:50 PM

## 2022-12-31 ENCOUNTER — Other Ambulatory Visit: Payer: Self-pay | Admitting: Family Medicine

## 2023-01-09 ENCOUNTER — Encounter: Payer: Self-pay | Admitting: Internal Medicine

## 2023-01-09 ENCOUNTER — Other Ambulatory Visit: Payer: Self-pay

## 2023-01-09 ENCOUNTER — Ambulatory Visit: Payer: Medicare Other | Admitting: Internal Medicine

## 2023-01-09 ENCOUNTER — Other Ambulatory Visit: Payer: Self-pay | Admitting: Gastroenterology

## 2023-01-09 VITALS — BP 132/66 | HR 79 | Temp 97.8°F | Ht 65.5 in | Wt 141.0 lb

## 2023-01-09 DIAGNOSIS — K219 Gastro-esophageal reflux disease without esophagitis: Secondary | ICD-10-CM | POA: Diagnosis not present

## 2023-01-09 DIAGNOSIS — R159 Full incontinence of feces: Secondary | ICD-10-CM

## 2023-01-09 DIAGNOSIS — K921 Melena: Secondary | ICD-10-CM

## 2023-01-09 DIAGNOSIS — R195 Other fecal abnormalities: Secondary | ICD-10-CM

## 2023-01-09 DIAGNOSIS — I350 Nonrheumatic aortic (valve) stenosis: Secondary | ICD-10-CM

## 2023-01-09 DIAGNOSIS — Z8711 Personal history of peptic ulcer disease: Secondary | ICD-10-CM

## 2023-01-09 NOTE — Patient Instructions (Signed)
It was great to see you again today!  You have intermittent episodes of incontinence likely due to weak sphincter muscles.  You are not impacted today.  No blood in your stool today but blood has been detected in your stool previously.  Take a 2 mg Imodium in the evening and in the morning each day improve continence.  Empiric off-label resin binder therapy may be an alternative treatment.  You have a history of anemia and blood in your stool.  After you have your aortic valve replaced, we will discuss the possibility of 1 more colonoscopy.  Continue Protonix 40 mg twice daily for reflux.  Did have a prominent external hemorrhoid tag.  Hemorrhoid information provided  Office visit here in 6 weeks.

## 2023-01-09 NOTE — Progress Notes (Signed)
Primary Care Physician:  Sonny Masters, FNP Primary Gastroenterologist:  Dr. Jena Gauss  Pre-Procedure History & Physical: HPI:  [name] is a 87 y.o. female here for follow-up.  History of Hemoccult positive stool nodular liver on prior cross-sectional imaging.  Anemia.  CAD, atrial fibrillation severe aortic stenosis (plans being made for repair in the next week or 2.).  Patient states she has episodes of incontinence of stool just comes out of her rectum as she describes.  She has not had diarrhea.  She states it is formed stool.  She is intermittently incontinent.  Last colonoscopy was over 10 years ago.  She is chronically anticoagulated.  She continues to be anemic.  Has not had any hematochezia or melena.  Reflux well-controlled on twice daily PPI therapy.  Past Medical History:  Diagnosis Date   A-fib [facility])    Anxiety    CAD S/P percutaneous coronary angioplasty 1990s, 11/28/2011    a) midRCA PCI 1999 (NIR BMS 3.0 mm x 32 mm); distal  RCA BMS Sept 2012 (Vision BMS 2.5 mm x 15 mm); 12/02: RCA ISR --> Cutting PTCA    Coronary stent restenosis due to progression of disease 11/30/2011   Dyslipidemia, goal LDL below 70 11/28/2011   Essential hypertension    On ACE inhibitor and ARB? As well as atenolol and hydralazine   Gastric ulcer 11/2018   GERD (gastroesophageal reflux disease)    History of blood transfusion    without reaction   Moderate aortic stenosis by prior echocardiogram 08/2011; 08/2013   Echo 11/09/18: EF 60-65%. Gr 2 DD.  No RWMA.  Mild-Mod AS (mean gradient 19 mmHg). Mild-Mod PAH. ->  Follow-up echo March 2021 showed mean gradient 17 mmHg (dimensionless index 0.32)-this was reported as moderate-severe AS   Myocardial infarction (HCC) 1990s   Had PTCA then PCI on RCA; 11/2011--> Dyspnea and Shoulder pain prior to adm    Non-ST elevation MI (NSTEMI) (HCC) 11/28/2011   Dyspnea and Shoulder pain prior to adm   Type 2 diabetes mellitus with complication, without  long-term current use of insulin (HCC)    no longer taking diabetic medication - diet controlled    Past Surgical History:  Procedure Laterality Date   ABDOMINAL HYSTERECTOMY     BIOPSY  06/03/2018   Procedure: BIOPSY;  Surgeon: Malissa Hippo, MD;  Location: [facility];  Service: Endoscopy;;  gastric    BIOPSY  09/15/2022   Procedure: BIOPSY;  Surgeon: Lanelle Bal, DO;  Location: [facility];  Service: Endoscopy;;   BREAST BIOPSY     CARDIAC CATHETERIZATION  2012   performed for angina   CARDIAC CATHETERIZATION     CARDIOVERSION N/A 10/24/2020   Procedure: CARDIOVERSION;  Surgeon: Jake Bathe, MD;  Location: [facility];  Service: Cardiovascular;  Laterality: N/A;   CORONARY ANGIOPLASTY  11/2006   Cutting Balloon of stent in the RCA   ESOPHAGOGASTRODUODENOSCOPY N/A 06/03/2018   Procedure: ESOPHAGOGASTRODUODENOSCOPY (EGD);  Surgeon: Malissa Hippo, MD;  Location: [facility];  Service: Endoscopy;  Laterality: N/A;  1:40   ESOPHAGOGASTRODUODENOSCOPY N/A 12/02/2018   Procedure: ESOPHAGOGASTRODUODENOSCOPY (EGD);  Surgeon: Malissa Hippo, MD;  Location: [facility];  Service: Endoscopy;  Laterality: N/A;  255   ESOPHAGOGASTRODUODENOSCOPY (EGD) WITH PROPOFOL N/A 09/15/2022   Procedure: ESOPHAGOGASTRODUODENOSCOPY (EGD) WITH PROPOFOL;  Surgeon: Lanelle Bal, DO;  Location: [facility];  Service: Endoscopy;  Laterality: N/A;  1:00pm, asa 3   LEFT HEART CATHETERIZATION  WITH CORONARY ANGIOGRAM N/A 11/27/2011   Procedure: LEFT HEART CATHETERIZATION WITH CORONARY ANGIOGRAM;  Surgeon: Leonie Man, MD;  Location: [facility] CATH LAB;  Service: Cardiovascular;  Laterality: N/A;   PERCUTANEOUS CORONARY STENT INTERVENTION (PCI-S)  1999   Mid RCA - NIR DMS 3.0 mm x 30 mm, dRCA Vision BMS 2.5 mm x 15 mm    RIGHT HEART CATH AND CORONARY ANGIOGRAPHY N/A 11/24/2022   Procedure: RIGHT HEART CATH AND CORONARY ANGIOGRAPHY;  Surgeon: Leonie Man, MD;  Location: [place] CV LAB;   Service: Cardiovascular;  Laterality: N/A;   TRANSTHORACIC ECHOCARDIOGRAM  10/2018   EF 60-65%. Gr 2 DD.  No RWMA.  Mild-Mod AS  (mean gradient 19 mmHg). Mild-Mod PAH.   TRANSTHORACIC ECHOCARDIOGRAM  03/01/2020   EF 70 to 75%.  No or WMA.  Mild LVH.  GRII DD with severe LA enlargement.  Mildly elevated PAP.  Mild MR.  Moderate AS (mean gradient 17 mmHg) with dimensionless index of 0.32.   TUBAL LIGATION      Prior to Admission medications   Medication Sig Start Date End Date Taking? Authorizing Provider  acetaminophen (TYLENOL) 500 MG tablet Take 1,000 mg by mouth every 6 (six) hours as needed for moderate pain.   Yes [provider]  apixaban (ELIQUIS) 2.5 MG TABS tablet Take 1 tablet (2.5 mg total) by mouth 2 (two) times daily. (NEEDS TO BE SEEN BEFORE NEXT REFILL) 12/31/22  Yes Rakes, Connye Burkitt, FNP  atorvastatin (LIPITOR) 20 MG tablet TAKE ONE (1) TABLET EACH DAY 12/08/22  Yes Rakes, Connye Burkitt, FNP  benazepril (LOTENSIN) 40 MG tablet Take 1 tablet (40 mg total) by mouth daily. Patient taking differently: Take 20 mg by mouth 2 (two) times daily. 05/01/22  Yes Rakes, Connye Burkitt, FNP  carvedilol (COREG) 25 MG tablet TAKE ONE TABLET BY MOUTH TWICE DAILY 09/12/22  Yes Rakes, Connye Burkitt, FNP  diclofenac Sodium (VOLTAREN) 1 % GEL Apply 2-4 g topically 2 (two) times daily as needed (back/hand pain.).  07/25/19  Yes [provider]  diphenhydramine-acetaminophen (TYLENOL PM) 25-500 MG TABS tablet Take 2 tablets by mouth at bedtime as needed (sleep).   Yes [provider]  Ferrous Sulfate Dried (SLOW RELEASE IRON) 45 MG TBCR Take 45 mg by mouth daily.   Yes [provider]  furosemide (LASIX) 40 MG tablet Take 1 tablet (40 mg total) by mouth daily as needed for fluid. 08/06/22  Yes Rakes, Connye Burkitt, FNP  hydrALAZINE (APRESOLINE) 100 MG tablet TAKE ONE (1) TABLET BY MOUTH TWO (2) TIMES DAILY 09/12/22  Yes Rakes, Connye Burkitt, FNP  hydrocortisone (ANUSOL-HC) 2.5 % rectal cream Place 1  Application rectally 2 (two) times daily. 11/17/22  Yes Mahala Menghini, PA-C  NITROSTAT 0.4 MG SL tablet DISSOLVE 1 TAB UNDER TOUNGE FOR CHEST PAIN. MAY REPEAT EVERY 5 MINUTES FOR 3 DOSES. IF NO RELIEF CALL 911 OR GO TO ER 11/24/16  Yes Leonie Man, MD  pantoprazole (PROTONIX) 40 MG tablet Take 1 tablet (40 mg total) by mouth 2 (two) times daily before a meal. 09/02/22  Yes Mahala Menghini, PA-C  sertraline (ZOLOFT) 25 MG tablet TAKE ONE (1) TABLET BY MOUTH EVERY DAY 12/08/22  Yes Rakes, Connye Burkitt, FNP  traMADol (ULTRAM) 50 MG tablet TAKE 1 TABLET BY MOUTH EVERY 6 HOURS AS NEEDED FOR PAIN 09/08/22  Yes Kirsteins, Luanna Salk, MD    Allergies as of 01/09/2023 - Review Complete 01/09/2023  Allergen Reaction Noted   Valium  Nausea And Vomiting 11/27/2011    Family History  Problem Relation Age of Onset   Cancer Mother    Heart disease Father    Heart disease Maternal Grandmother    Heart disease Maternal Grandfather    Heart disease Brother    Nephrolithiasis Brother        accidental death   Heart Problems Brother        CABG    Social History   Socioeconomic History   Marital status: Widowed    Spouse name: Channing Mutters   Number of children: 3   Years of education: Not on file   Highest education level: Not on file  Occupational History   Occupation: Retired Engineer, civil (consulting)  Tobacco Use   Smoking status: Former   Smokeless tobacco: Never   Tobacco comments:    Quit approximately 1993  Vaping Use   Vaping Use: Never used  Substance and Sexual Activity   Alcohol use: No   Drug use: No   Sexual activity: Not Currently    Birth control/protection: Post-menopausal  Other Topics Concern   Not on file  Social History Narrative   She is a married mother of 3, 2 daughters, 1 son who all live out of state but talks to weekly.   Step-son and his family live next door. Sees often.    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    Quit smoking in '99, denies alcohol.   She volunteers for [employer].   Social Determinants of Health   Financial Resource Strain: Low Risk  (06/18/2022)   Overall Financial Resource Strain (CARDIA)    Difficulty of Paying Living Expenses: Not hard at all  Food Insecurity: No Food Insecurity (06/18/2022)   Hunger Vital Sign    Worried About Running Out of Food in the Last Year: Never true    Ran Out of Food in the Last Year: Never true  Transportation Needs: No Transportation Needs (06/18/2022)   PRAPARE - Administrator, Civil Service (Medical): No    Lack of Transportation (Non-Medical): No  Physical Activity: Inactive (06/18/2022)   Exercise Vital Sign    Days of Exercise per Week: 0 days    Minutes of Exercise per Session: 0 min  Stress: No Stress Concern Present (06/18/2022)   [employer] of Occupational Health - Occupational Stress Questionnaire    Feeling of Stress : Only a little  Social Connections: Moderately Integrated (06/18/2022)   Social Connection and Isolation Panel [NHANES]    Frequency of Communication with Friends and Family: More than three times a week    Frequency of Social Gatherings with Friends and Family: More than three times a week    Attends Religious Services: 1 to 4 times per year    Active Member of [employer] or Organizations: No    Attends Banker Meetings: Never    Marital Status: Married  Catering manager Violence: Not At Risk (06/18/2022)   Humiliation, Afraid, Rape, and Kick questionnaire    Fear of Current or Ex-Partner: No    Emotionally Abused: No    Physically Abused: No    Sexually Abused: No    Review of Systems: See HPI, otherwise negative ROS  Physical Exam: BP 132/66 (BP Location: Right Arm, Patient Position: Sitting, Cuff Size: Normal)   Pulse 79   Temp 97.8 F (36.6 C) (Oral)   Ht 5' 5.5" (1.664 m)   Wt 141 lb (64 kg)   SpO2  95%   BMI 23.11 kg/m  General:   Alert,   pleasant and cooperative in NAD Neck:  Supple; no masses or thyromegaly. No significant  cervical adenopathy. Abdomen: Non-distended, normal bowel sounds.  Soft and nontender without appreciable mass or hepatosplenomegaly.  Rectal: She has a grade 4 hemorrhoid tag.  Moderately good sphincter tone.  Very poor squeeze.  No impaction.  No mass in the rectal vault.  No stool in the rectal vault.  Mucus is Hemoccult negative today.  Impression/Plan: 87 year old lady with multiple medical issues including CAD and what sounds like critical aortic stenosis comes to see me for bouts of fecal incontinence.  Not mentioned above she also has a urinary incontinence as well.  3 vaginal deliveries.  She likely has weakened sphincteric mechanisms to account for her stool incontinence.  It is well-controlled.  History of anemia occult blood positive previously.  Colonoscopy is out of date by 11 years.  Nodular living on cross-sectional imaging she may well have cirrhosis but she does not have portal hypertension and appears to have very good preserved hepatic synthetic function.  I would not favor an extensive evaluation\  Recommendations:   You have intermittent episodes of incontinence likely due to weak sphincter muscles.  You are not impacted today.  No blood in your stool today but blood has been detected in your stool previously.  Take a 2 mg Imodium in the evening and in the morning each day improve continence.  Empiric off-label resin binder therapy may be an alternative treatment.  You have a history of anemia and blood in your stool.  After you have your aortic valve replaced, we will discuss the possibility of 1 more colonoscopy.  Continue Protonix 40 mg twice daily for reflux.  Did have a prominent external hemorrhoid tag.  Hemorrhoid information provided  Office visit here in 6 weeks.  You have intermittent episodes of incontinence likely due to weak sphincter muscles.  You are not impacted today.  No blood in your stool today but blood has been detected in your stool  previously.  Take a 2 mg Imodium in the evening and in the morning each day improve continence.  Empiric off-label resin binder therapy may be an alternative treatment.  You have a history of anemia and blood in your stool.  After you have your aortic valve replaced, we will discuss the possibility of 1 more colonoscopy.  Continue Protonix 40 mg twice daily for reflux.  Did have a prominent external hemorrhoid tag.  Hemorrhoid information provided  Office visit here in 6 weeks.  Notice: This dictation was prepared with Dragon dictation along with smaller phrase technology. Any transcriptional errors that result from this process are unintentional and may not be corrected upon review.

## 2023-01-23 ENCOUNTER — Ambulatory Visit (HOSPITAL_COMMUNITY)
Admission: RE | Admit: 2023-01-23 | Discharge: 2023-01-23 | Disposition: A | Discharge status: Home / Self Care | Payer: Medicare Other | Source: Ambulatory Visit | Attending: Cardiovascular Disease | Admitting: Cardiovascular Disease

## 2023-01-23 ENCOUNTER — Encounter (HOSPITAL_COMMUNITY)
Admission: RE | Admit: 2023-01-23 | Discharge: 2023-01-23 | Disposition: A | Discharge status: Home / Self Care | Payer: Medicare Other | Source: Ambulatory Visit | Attending: Cardiovascular Disease | Admitting: Cardiovascular Disease

## 2023-01-23 DIAGNOSIS — Z1152 Encounter for screening for COVID-19: Secondary | ICD-10-CM | POA: Insufficient documentation

## 2023-01-23 DIAGNOSIS — Z01818 Encounter for other preprocedural examination: Secondary | ICD-10-CM | POA: Diagnosis not present

## 2023-01-23 DIAGNOSIS — R918 Other nonspecific abnormal finding of lung field: Secondary | ICD-10-CM | POA: Diagnosis not present

## 2023-01-23 DIAGNOSIS — I350 Nonrheumatic aortic (valve) stenosis: Secondary | ICD-10-CM | POA: Diagnosis not present

## 2023-01-23 LAB — CBC
HCT: 32.6 % — ABNORMAL LOW (ref 36.0–46.0)
Hemoglobin: 9.8 g/dL — ABNORMAL LOW (ref 12.0–15.0)
MCH: 27.5 pg (ref 26.0–34.0)
MCHC: 30.1 g/dL (ref 30.0–36.0)
MCV: 91.3 fL (ref 80.0–100.0)
Platelets: 226 10*3/uL (ref 150–400)
RBC: 3.57 MIL/uL — ABNORMAL LOW (ref 3.87–5.11)
RDW: 15.7 % — ABNORMAL HIGH (ref 11.5–15.5)
WBC: 5.2 10*3/uL (ref 4.0–10.5)
nRBC: 0 % (ref 0.0–0.2)

## 2023-01-23 LAB — SURGICAL PCR SCREEN
MRSA, PCR: NEGATIVE
Staphylococcus aureus: NEGATIVE

## 2023-01-23 LAB — PROTIME-INR
INR: 1.5 — ABNORMAL HIGH (ref 0.8–1.2)
Prothrombin Time: 17.6 seconds — ABNORMAL HIGH (ref 11.4–15.2)

## 2023-01-23 LAB — COMPREHENSIVE METABOLIC PANEL
ALT: 8 U/L (ref 0–44)
AST: 18 U/L (ref 15–41)
Albumin: 3.8 g/dL (ref 3.5–5.0)
Alkaline Phosphatase: 45 U/L (ref 38–126)
Anion gap: 9 (ref 5–15)
BUN: 14 mg/dL (ref 8–23)
CO2: 26 mmol/L (ref 22–32)
Calcium: 8.8 mg/dL — ABNORMAL LOW (ref 8.9–10.3)
Chloride: 103 mmol/L (ref 98–111)
Creatinine, Ser: 0.89 mg/dL (ref 0.44–1.00)
GFR, Estimated: >60 mL/min (ref >60)
Glucose, Bld: 140 mg/dL — ABNORMAL HIGH (ref 70–99)
Potassium: 3.5 mmol/L (ref 3.5–5.1)
Sodium: 138 mmol/L (ref 135–145)
Total Bilirubin: 0.9 mg/dL (ref 0.3–1.2)
Total Protein: 6.8 g/dL (ref 6.5–8.1)

## 2023-01-23 LAB — SARS CORONAVIRUS 2 (TAT 6-24 HRS): SARS Coronavirus 2: NEGATIVE

## 2023-01-23 NOTE — Progress Notes (Signed)
CHG soap given to pt with instructions. Instructions explained and all questions answered.  Pt unable to give urine sample at PAT due to incontinence. Theodosia Quay, RN, notified. UA order put back, per Lauren, so pt can try again on DOS to give urine sample.

## 2023-01-23 NOTE — Progress Notes (Signed)
Pre Surgical Assessment: 5 M Walk Test  21M=16.43ft  5 Meter Walk Test- trial 1: 7.0 seconds 5 Meter Walk Test- trial 2: 6.8 seconds 5 Meter Walk Test- trial 3: 7.3 seconds 5 Meter Walk Test Average: 7.03 seconds

## 2023-01-26 MED ORDER — CEFAZOLIN SODIUM-DEXTROSE 2-4 GM/100ML-% IV SOLN
2.0000 g | INTRAVENOUS | Status: AC
  Administered 2023-01-27: 2 g via INTRAVENOUS
  Filled 2023-01-26: qty 100

## 2023-01-26 MED ORDER — HEPARIN 30,000 UNITS/1000 ML (OHS) CELLSAVER SOLUTION
Status: DC
  Filled 2023-01-26: qty 1000

## 2023-01-26 MED ORDER — MAGNESIUM SULFATE 50 % IJ SOLN
40.0000 meq | INTRAMUSCULAR | Status: DC
  Filled 2023-01-26: qty 9.85

## 2023-01-26 MED ORDER — POTASSIUM CHLORIDE 2 MEQ/ML IV SOLN
80.0000 meq | INTRAVENOUS | Status: DC
  Filled 2023-01-26: qty 40

## 2023-01-26 MED ORDER — NOREPINEPHRINE 4 MG/250ML-% IV SOLN
0.0000 ug/min | INTRAVENOUS | Status: AC
  Administered 2023-01-27: 2 ug/min via INTRAVENOUS
  Filled 2023-01-26: qty 250

## 2023-01-26 MED ORDER — DEXMEDETOMIDINE HCL IN NACL 400 MCG/100ML IV SOLN
0.1000 ug/kg/h | INTRAVENOUS | Status: DC
  Filled 2023-01-26: qty 100

## 2023-01-26 NOTE — H&P (Signed)
[address]       [place] [address]             [phone]      Cardiothoracic Surgery Admission History and Physical   Ric[facility] Record [id number] Date of Birth: [birth year]   Referring: Leonie Man, MD Primary Care: Baruch Gouty, FNP Primary Cardiologist: Glenetta Hew, MD   Chief Complaint:        Chief Complaint  Patient presents with   Aortic Stenosis          History of Present Illness:    [name] 87 y.o. female here for TAVR evaluation    This 87 year old woman has persistent atrial fibrillation, CAD with prior PCI, chronic diastolic CHF, HTN, HLD, DM diet controlled and severe aortic stenosis who is here today as a new consult, referred by Dr. Ellyn Hack, for further discussion regarding her aortic stenosis and possible TAVR. She has CAD and had a bare metal stent placed in the RCA in 1999 and another distal bare metal stent placed in the distal RCA in 2012. Last cardiac cath in 2012 with severe in stent restenosis in distal RCA stent treated with cutting balloon angioplasty. She has been followed over the past year for severe aortic stenosis. Echo December 2022 with normal LV function, mild MR and severe low flow/low gradient AS with mean gradient 27 mmHg, AVA 0.8 cm2. She was called at that time and referral to the structural heart clinic was made but she had other family issues and did not schedule this appt. Most recent echo 06/20/22 with LVEF=65-70%, mild MR. Severe low flow/low gradient AS with mean gradient 27 mmHg, AVA 0.72 cm2, DI 0.23, SVI 34. She had a recent upper endoscopy and was found to have gastritis with no ulcer.    She reports that she has progressive fatigue and dyspnea. Occasional dizziness. She has chronic LE edema. She has no chest pain. She lives alone in [place], [place]. Her children live out of town but one child is usually with her almost all of the time. Her husband died of pancreatic  cancer in 08-03-22. She is a retired Marine scientist. She worked at [employer] and [employer] as well as in [place] and [place]. She has her own teeth. She has regular dental follow up. "            Past Medical History:  Diagnosis Date   A-fib [facility])     Anxiety     CAD S/P percutaneous coronary angioplasty 1990s, 11/28/2011     a) midRCA PCI 1999 (NIR BMS 3.0 mm x 32 mm); distal  RCA BMS Sept 2012 (Vision BMS 2.5 mm x 15 mm); 12/02: RCA ISR --> Cutting PTCA    Coronary stent restenosis due to progression of disease 11/30/2011   Dyslipidemia, goal LDL below 70 11/28/2011   Essential hypertension      On ACE inhibitor and ARB? As well as atenolol and hydralazine   Gastric ulcer 11/2018   GERD (gastroesophageal reflux disease)     History of blood transfusion      without reaction   Moderate aortic stenosis by prior echocardiogram 08/2011; 08/2013    Echo 11/09/18: EF 60-65%. Gr 2 DD.  No RWMA.  Mild-Mod AS (mean gradient 19 mmHg). Mild-Mod PAH. ->  Follow-up echo March 2021 showed mean gradient 17 mmHg (dimensionless index 0.32)-this was reported as moderate-severe AS  Myocardial infarction [facility]) 1990s    Had PTCA then PCI on RCA; 11/2011--> Dyspnea and Shoulder pain prior to adm    Non-ST elevation MI (NSTEMI) ([facility]) 11/28/2011    Dyspnea and Shoulder pain prior to adm   Type 2 diabetes mellitus with complication, without long-term current use of insulin ([facility])      no longer taking diabetic medication - diet controlled           Past Surgical History:  Procedure Laterality Date   ABDOMINAL HYSTERECTOMY       BIOPSY   06/03/2018    Procedure: BIOPSY;  Surgeon: Rogene Houston, MD;  [facility];  Service: Endoscopy;;  gastric     BIOPSY   09/15/2022    Procedure: BIOPSY;  Surgeon: Eloise Harman, DO;  [facility]: [facility];  Service: Endoscopy;;   BREAST BIOPSY       CARDIAC CATHETERIZATION   2012    performed for angina   CARDIAC CATHETERIZATION        CARDIOVERSION N/A 10/24/2020    Procedure: CARDIOVERSION;  Surgeon: Jerline Pain, MD;  Location: [facility] END[facility]COPY;  Service: Cardiovascular;  Laterality: N/A;   CORONARY ANGIOPLASTY   11/2006    Cutting Balloon of stent in the RCA   ESOPHAGOGASTRODUODENOSCOPY N/A 06/03/2018    Procedure: ESOPHAGOGASTRODUODENOSCOPY (EGD);  Surgeon: Rogene Houston, MD;  [facility];  Service: Endoscopy;  Laterality: N/A;  1:40   ESOPHAGOGASTRODUODENOSCOPY N/A 12/02/2018    Procedure: ESOPHAGOGASTRODUODENOSCOPY (EGD);  Surgeon: Rogene Houston, MD;  [facility];  Service: Endoscopy;  Laterality: N/A;  255   ESOPHAGOGASTRODUODENOSCOPY (EGD) WITH PROPOFOL N/A 09/15/2022    Procedure: ESOPHAGOGASTRODUODENOSCOPY (EGD) WITH PROPOFOL;  Surgeon: Eloise Harman, DO;  [facility];  Service: Endoscopy;  Laterality: N/A;  1:00pm, asa 3   LEFT HEART CATHETERIZATION WITH CORONARY ANGIOGRAM N/A 11/27/2011    Procedure: LEFT HEART CATHETERIZATION WITH CORONARY ANGIOGRAM;  Surgeon: Leonie Man, MD;  Location: [facility] CATH LAB;  Service: Cardiovascular;  Laterality: N/A;   PERCUTANEOUS CORONARY STENT INTERVENTION (PCI-S)   1999    Mid RCA - NIR DMS 3.0 mm x 30 mm, dRCA Vision BMS 2.5 mm x 15 mm    RIGHT HEART CATH AND CORONARY ANGIOGRAPHY N/A 11/24/2022    Procedure: RIGHT HEART CATH AND CORONARY ANGIOGRAPHY;  Surgeon: Leonie Man, MD;  Location: [facility] CV LAB;  Service: Cardiovascular;  Laterality: N/A;   TRANSTHORACIC ECHOCARDIOGRAM   10/2018    EF 60-65%. Gr 2 DD.  No RWMA.  Mild-Mod AS  (mean gradient 19 mmHg). Mild-Mod PAH.   TRANSTHORACIC ECHOCARDIOGRAM   03/01/2020    EF 70 to 75%.  No or WMA.  Mild LVH.  GRII DD with severe LA enlargement.  Mildly elevated PAP.  Mild MR.  Moderate AS (mean gradient 17 mmHg) with dimensionless index of 0.32.   TUBAL LIGATION               Family History  Problem Relation Age of Onset   Cancer Mother     Heart disease Father     Heart disease  Maternal Grandmother     Heart disease Maternal Grandfather     Heart disease Brother     Nephrolithiasis Brother          accidental death   Heart Problems Brother          CABG        Social  History        Tobacco Use  Smoking Status Former  Smokeless Tobacco Never  Tobacco Comments    Quit approximately 49    Social History       Substance and Sexual Activity  Alcohol Use No            Allergies  Allergen Reactions   Valium Nausea And Vomiting            Current Outpatient Medications  Medication Sig Dispense Refill   acetaminophen (TYLENOL) 500 MG tablet Take 1,000 mg by mouth every 6 (six) hours as needed for moderate pain.       apixaban (ELIQUIS) 2.5 MG TABS tablet TAKE ONE (1) TABLET BY MOUTH TWO (2) TIMES DAILY 60 tablet 0   atorvastatin (LIPITOR) 20 MG tablet TAKE ONE (1) TABLET EACH DAY 90 tablet 2   benazepril (LOTENSIN) 40 MG tablet Take 1 tablet (40 mg total) by mouth daily. (Patient taking differently: Take 20 mg by mouth 2 (two) times daily.) 90 tablet 3   carvedilol (COREG) 25 MG tablet TAKE ONE TABLET BY MOUTH TWICE DAILY 180 tablet 3   diclofenac Sodium (VOLTAREN) 1 % GEL Apply 2-4 g topically 2 (two) times daily as needed (back/hand pain.).        diphenhydramine-acetaminophen (TYLENOL PM) 25-500 MG TABS tablet Take 2 tablets by mouth at bedtime as needed (sleep).       Ferrous Sulfate Dried (SLOW RELEASE IRON) 45 MG TBCR Take 45 mg by mouth daily.       furosemide (LASIX) 40 MG tablet Take 1 tablet (40 mg total) by mouth daily as needed for fluid. 30 tablet 3   hydrALAZINE (APRESOLINE) 100 MG tablet TAKE ONE (1) TABLET BY MOUTH TWO (2) TIMES DAILY 180 tablet 1   hydrocortisone (ANUSOL-HC) 2.5 % rectal cream Place 1 Application rectally 2 (two) times daily. 30 g 1   NITROSTAT 0.4 MG SL tablet DISSOLVE 1 TAB UNDER TOUNGE FOR CHEST PAIN. MAY REPEAT EVERY 5 MINUTES FOR 3 DOSES. IF NO RELIEF CALL 911 OR GO TO ER 25 tablet 3   pantoprazole (PROTONIX)  40 MG tablet Take 1 tablet (40 mg total) by mouth 2 (two) times daily before a meal. 60 tablet 3   sertraline (ZOLOFT) 25 MG tablet TAKE ONE (1) TABLET BY MOUTH EVERY DAY 90 tablet 0   traMADol (ULTRAM) 50 MG tablet TAKE 1 TABLET BY MOUTH EVERY 6 HOURS AS NEEDED FOR PAIN 30 tablet 1    No current facility-administered medications for this visit.      ROS 14 point ROS reviewed and negative except as per HPI     PHYSICAL EXAMINATION: BP (!) 170/90   Pulse 90   Resp 20   Ht 5' 5.5" (1.664 m)   Wt 140 lb (63.5 kg)   SpO2 95% Comment: RA  BMI 22.94 kg/m    Gen: NAD Neuro: Alert and oriented CV: irreg, + systolic murmur Resp: Nonlaboured Abd: Soft, ntnd Extr: WWP   Diagnostic Studies & Laboratory data:     Recent Radiology Findings:    Imaging Results  CT CORONARY MORPH W/CTA COR W/SCORE W/CA W/CM &/OR WO/CM   Addendum Date: 12/04/2022   ADDENDUM REPORT: 12/04/2022 08:56 ADDENDUM: Extracardiac findings are described separately under dictation for contemporaneously obtained CTA chest, abdomen and pelvis dated 12/03/2022. Please see that report for full description of relevant extracardiac findings. Electronically Signed   By: Vinnie Langton M.D.   On: 12/04/2022 08:56  Result Date: 12/04/2022 CLINICAL DATA:  Aortic Stenosis EXAM: Cardiac TAVR CT TECHNIQUE: The patient was scanned on a Siemens Force [id number] slice scanner. A 120 kV retrospective scan was triggered in the ascending thoracic aorta at 140 HU's. Gantry rotation speed was 250 msecs and collimation was .6 mm. No beta blockade or nitro were given. The 3D data set was reconstructed in 5% intervals of the R-R cycle. Systolic and diastolic phases were analyzed on a dedicated work station using MPR, MIP and VRT modes. The patient received 80 cc of contrast. FINDINGS: Aortic Valve: Tri leaflet calcified with score 2683 Aorta: Severe calcific atherosclerosis Normal arch vessels no aneurysm Sino-tubular Junction: 26 mm with  circumferential calcium Ascending Thoracic Aorta: 30 mm Aortic Arch: 24 mm Descending Thoracic Aorta: 22 mm Sinus of Valsalva Measurements: Non-coronary: 29.8 mm Right - coronary: 29.9 mm Left -   coronary: 29.1 mm Coronary Artery Height above Annulus: Left Main: 15.8 mm above annulus Right Coronary: 16.9 mm above annulus Virtual Basal Annulus Measurements: Maximum / Minimum Diameter: 24.5 mm x 17.6 mm Perimeter: 68.8 mm Area: 351 mm2 Coronary Arteries: Sufficient height above annulus for deployment Optimum Fluoroscopic Angle for Delivery: LAO 23 Cranial 7 degrees IMPRESSION: 1. Tri- leaflet calcified aortic valve with score 2683 2.  Normal ascending thoracic aorta 3.0 cm 3.  Coronary arteries sufficient height for deployment 4.  Annular area of 351 mm2 suitable for a 23 mm Sapien Ultra valve 5.  Membranous septal length 9.4 mm 6. Optimal angiographic angle for deployment LAO 23 Cranial 7 degrees Jenkins Rouge Electronically Signed: By: Jenkins Rouge M.D. On: 12/03/2022 11:29    CT ANGIO ABDOMEN PELVIS  W &/OR WO CONTRAST   Result Date: 12/04/2022 CLINICAL DATA:  87 year old female with history of aortic stenosis. Preprocedural study prior to potential transcatheter aortic valve replacement (TAVR) procedure. EXAM: CT ANGIOGRAPHY CHEST, ABDOMEN AND PELVIS TECHNIQUE: Multidetector CT imaging through the chest, abdomen and pelvis was performed using the standard protocol during bolus administration of intravenous contrast. Multiplanar reconstructed images and MIPs were obtained and reviewed to evaluate the vascular anatomy. RADIATION DOSE REDUCTION: This exam was performed according to the departmental dose-optimization program which includes automated exposure control, adjustment of the mA and/or kV according to patient size and/or use of iterative reconstruction technique. CONTRAST:  80mL OMNIPAQUE IOHEXOL 350 MG/ML SOLN COMPARISON:  No priors. FINDINGS: CTA CHEST FINDINGS Cardiovascular: Heart size is enlarged  with biatrial dilatation (right greater than left). There is no significant pericardial fluid, thickening or pericardial calcification. There is aortic atherosclerosis, as well as atherosclerosis of the great vessels of the mediastinum and the coronary arteries, including calcified atherosclerotic plaque in the left anterior descending, left circumflex and right coronary arteries. Thickening and calcification of the aortic valve. Mediastinum/Lymph Nodes: No pathologically enlarged mediastinal or hilar lymph nodes. Small to moderate hiatal hernia. No axillary lymphadenopathy. Lungs/Pleura: Moderate right and small left pleural effusions lying dependently with areas of passive subsegmental atelectasis in the lung bases bilaterally. No confluent consolidative airspace disease. No definite suspicious appearing pulmonary nodules or masses are noted. Musculoskeletal/Soft Tissues: There are no aggressive appearing lytic or blastic lesions noted in the visualized portions of the skeleton. CTA ABDOMEN AND PELVIS FINDINGS Hepatobiliary: Liver has a slightly shrunken appearance and nodular contour, indicative of underlying cirrhosis. No definite suspicious cystic or solid hepatic lesions are confidently identified on today's arterial phase examination. No intra or extrahepatic biliary ductal dilatation. Gallbladder is unremarkable in appearance. Pancreas: No pancreatic mass. No pancreatic ductal  dilatation. No pancreatic or peripancreatic fluid collections or inflammatory changes. Spleen: Unremarkable. Adrenals/Urinary Tract: Bilateral kidneys and bilateral adrenal glands are normal in appearance. No hydroureteronephrosis. Urinary bladder is unremarkable in appearance. Stomach/Bowel: The appearance of the stomach is unremarkable. No pathologic dilatation of small bowel or colon. Normal appendix. Vascular/Lymphatic: Extensive atherosclerosis throughout the abdominal and pelvic vasculature, with vascular findings and  measurements pertinent to potential TAVR procedure, as detailed below. Portal vein is patent measuring 13 mm in the porta hepatis. No lymphadenopathy noted in the abdomen or pelvis. Reproductive: Status post hysterectomy. Ovaries are not confidently identified may be surgically absent or atrophic. Other: Moderate volume of ascites.  No pneumoperitoneum. Musculoskeletal: There are no aggressive appearing lytic or blastic lesions noted in the visualized portions of the skeleton. VASCULAR MEASUREMENTS PERTINENT TO TAVR: AORTA: Minimal Aortic Diameter-11 x 10 mm Severity of Aortic Calcification-severe RIGHT PELVIS: Right Common Iliac Artery - Minimal Diameter-4.2 x 4.4 mm Tortuosity-mild Calcification-severe Right External Iliac Artery - Minimal Diameter-6.3 x 5.2 mm Tortuosity-moderate Calcification-mild Right Common Femoral Artery - Minimal Diameter-5.0 x 6.3 mm Tortuosity-mild Calcification-moderate LEFT PELVIS: Left Common Iliac Artery - Minimal Diameter-7.2 x 4.1 mm Tortuosity-mild Calcification-severe Left External Iliac Artery - Minimal Diameter-6.1 x 5.3 mm Tortuosity-mild Calcification-mild Left Common Femoral Artery - Minimal Diameter-6.3 x 5.8 mm Tortuosity-mild Calcification-moderate Review of the MIP images confirms the above findings. IMPRESSION: 1. Vascular findings and measurements pertinent to potential TAVR procedure, as detailed above. 2. Thickening and calcification of the aortic valve, compatible with reported clinical history of severe aortic stenosis. 3. Moderate right and small left pleural effusions with areas of passive subsegmental atelectasis in the lower lungs bilaterally (right greater than left). 4. Cardiomegaly with biatrial dilatation. 5. Aortic atherosclerosis, in addition to three-vessel coronary artery disease. 6. Cirrhosis. 7. Moderate volume of ascites. 8. Additional incidental findings, as above. Electronically Signed   By: Vinnie Langton M.D.   On: 12/04/2022 08:55    CT ANGIO  CHEST AORTA W/CM & OR WO/CM   Result Date: 12/04/2022 CLINICAL DATA:  87 year old female with history of aortic stenosis. Preprocedural study prior to potential transcatheter aortic valve replacement (TAVR) procedure. EXAM: CT ANGIOGRAPHY CHEST, ABDOMEN AND PELVIS TECHNIQUE: Multidetector CT imaging through the chest, abdomen and pelvis was performed using the standard protocol during bolus administration of intravenous contrast. Multiplanar reconstructed images and MIPs were obtained and reviewed to evaluate the vascular anatomy. RADIATION DOSE REDUCTION: This exam was performed according to the departmental dose-optimization program which includes automated exposure control, adjustment of the mA and/or kV according to patient size and/or use of iterative reconstruction technique. CONTRAST:  55mL OMNIPAQUE IOHEXOL 350 MG/ML SOLN COMPARISON:  No priors. FINDINGS: CTA CHEST FINDINGS Cardiovascular: Heart size is enlarged with biatrial dilatation (right greater than left). There is no significant pericardial fluid, thickening or pericardial calcification. There is aortic atherosclerosis, as well as atherosclerosis of the great vessels of the mediastinum and the coronary arteries, including calcified atherosclerotic plaque in the left anterior descending, left circumflex and right coronary arteries. Thickening and calcification of the aortic valve. Mediastinum/Lymph Nodes: No pathologically enlarged mediastinal or hilar lymph nodes. Small to moderate hiatal hernia. No axillary lymphadenopathy. Lungs/Pleura: Moderate right and small left pleural effusions lying dependently with areas of passive subsegmental atelectasis in the lung bases bilaterally. No confluent consolidative airspace disease. No definite suspicious appearing pulmonary nodules or masses are noted. Musculoskeletal/Soft Tissues: There are no aggressive appearing lytic or blastic lesions noted in the visualized portions of the skeleton. CTA ABDOMEN AND  PELVIS FINDINGS Hepatobiliary: Liver has a slightly shrunken appearance and nodular contour, indicative of underlying cirrhosis. No definite suspicious cystic or solid hepatic lesions are confidently identified on today's arterial phase examination. No intra or extrahepatic biliary ductal dilatation. Gallbladder is unremarkable in appearance. Pancreas: No pancreatic mass. No pancreatic ductal dilatation. No pancreatic or peripancreatic fluid collections or inflammatory changes. Spleen: Unremarkable. Adrenals/Urinary Tract: Bilateral kidneys and bilateral adrenal glands are normal in appearance. No hydroureteronephrosis. Urinary bladder is unremarkable in appearance. Stomach/Bowel: The appearance of the stomach is unremarkable. No pathologic dilatation of small bowel or colon. Normal appendix. Vascular/Lymphatic: Extensive atherosclerosis throughout the abdominal and pelvic vasculature, with vascular findings and measurements pertinent to potential TAVR procedure, as detailed below. Portal vein is patent measuring 13 mm in the porta hepatis. No lymphadenopathy noted in the abdomen or pelvis. Reproductive: Status post hysterectomy. Ovaries are not confidently identified may be surgically absent or atrophic. Other: Moderate volume of ascites.  No pneumoperitoneum. Musculoskeletal: There are no aggressive appearing lytic or blastic lesions noted in the visualized portions of the skeleton. VASCULAR MEASUREMENTS PERTINENT TO TAVR: AORTA: Minimal Aortic Diameter-11 x 10 mm Severity of Aortic Calcification-severe RIGHT PELVIS: Right Common Iliac Artery - Minimal Diameter-4.2 x 4.4 mm Tortuosity-mild Calcification-severe Right External Iliac Artery - Minimal Diameter-6.3 x 5.2 mm Tortuosity-moderate Calcification-mild Right Common Femoral Artery - Minimal Diameter-5.0 x 6.3 mm Tortuosity-mild Calcification-moderate LEFT PELVIS: Left Common Iliac Artery - Minimal Diameter-7.2 x 4.1 mm Tortuosity-mild Calcification-severe Left  External Iliac Artery - Minimal Diameter-6.1 x 5.3 mm Tortuosity-mild Calcification-mild Left Common Femoral Artery - Minimal Diameter-6.3 x 5.8 mm Tortuosity-mild Calcification-moderate Review of the MIP images confirms the above findings. IMPRESSION: 1. Vascular findings and measurements pertinent to potential TAVR procedure, as detailed above. 2. Thickening and calcification of the aortic valve, compatible with reported clinical history of severe aortic stenosis. 3. Moderate right and small left pleural effusions with areas of passive subsegmental atelectasis in the lower lungs bilaterally (right greater than left). 4. Cardiomegaly with biatrial dilatation. 5. Aortic atherosclerosis, in addition to three-vessel coronary artery disease. 6. Cirrhosis. 7. Moderate volume of ascites. 8. Additional incidental findings, as above. Electronically Signed   By: Vinnie Langton M.D.   On: 12/04/2022 08:55           I have independently reviewed the above radiology studies  and reviewed the findings with the patient.    Recent Lab Findings: Recent Labs       Lab Results  Component Value Date    WBC 4.8 11/17/2022    HGB 9.9 (L) 11/24/2022    HCT 29.0 (L) 11/24/2022    PLT 245 11/17/2022    GLUCOSE 129 (H) 11/24/2022    CHOL 137 03/19/2022    TRIG 96 03/19/2022    HDL 69 03/19/2022    LDLCALC 50 03/19/2022    ALT 14 08/06/2022    AST 18 08/06/2022    NA 142 11/24/2022    K 3.3 (L) 11/24/2022    CL 105 11/24/2022    CREATININE 0.92 11/24/2022    BUN 12 11/24/2022    CO2 28 11/24/2022    TSH 2.344 09/04/2011    INR 1.17 09/04/2011    HGBA1C 5.4 08/06/2022          Assessment / Plan:   64F with severe symptomatic aortic stenosis.   Meets criteria for AV replacement.   STS risk is 5.7%.   With cirrhosis and moderate volume ascites it is possible the patient may not obtain  complete relief from dyspnea with TAVR. This was discussed. INR of 1.1 and albumin 4.7 do not indicate synthetic  function is compromised.    Risks/benefits/alternatives of TAVR were discussed at length (90% standard recovery, 9% morbidity [any organ, typically access site vascular complication needing surgery, stroke, or pacemaker] and <1% mortality.    Valve: 23 Sapien (area 34mm2) Bailout: No (age) Access: Left Subclavian artery NYHA:II   Misc/procedural: Hold eliquis prior to surgery   Alleen Borne

## 2023-01-27 ENCOUNTER — Other Ambulatory Visit: Payer: Self-pay | Admitting: Cardiology

## 2023-01-27 ENCOUNTER — Inpatient Hospital Stay (HOSPITAL_COMMUNITY): Payer: Medicare Other | Admitting: Physician Assistant

## 2023-01-27 ENCOUNTER — Inpatient Hospital Stay (HOSPITAL_COMMUNITY)
Admission: RE | Admit: 2023-01-27 | Discharge: 2023-01-29 | DRG: 267 | Disposition: A | Discharge status: Home Health | Payer: Medicare Other | Attending: Surgery | Admitting: Surgery

## 2023-01-27 ENCOUNTER — Encounter (HOSPITAL_COMMUNITY): Payer: Self-pay | Admitting: Cardiovascular Disease

## 2023-01-27 ENCOUNTER — Encounter (HOSPITAL_COMMUNITY)
Admission: RE | Disposition: A | Discharge status: Home Health | Payer: Self-pay | Source: Home / Self Care | Attending: Surgery

## 2023-01-27 ENCOUNTER — Other Ambulatory Visit: Payer: Self-pay

## 2023-01-27 ENCOUNTER — Inpatient Hospital Stay (HOSPITAL_COMMUNITY): Payer: Medicare Other

## 2023-01-27 DIAGNOSIS — Z87891 Personal history of nicotine dependence: Secondary | ICD-10-CM

## 2023-01-27 DIAGNOSIS — K219 Gastro-esophageal reflux disease without esophagitis: Secondary | ICD-10-CM | POA: Diagnosis not present

## 2023-01-27 DIAGNOSIS — E1169 Type 2 diabetes mellitus with other specified complication: Secondary | ICD-10-CM | POA: Diagnosis not present

## 2023-01-27 DIAGNOSIS — Z809 Family history of malignant neoplasm, unspecified: Secondary | ICD-10-CM

## 2023-01-27 DIAGNOSIS — I5032 Chronic diastolic (congestive) heart failure: Secondary | ICD-10-CM | POA: Diagnosis not present

## 2023-01-27 DIAGNOSIS — Z7901 Long term (current) use of anticoagulants: Secondary | ICD-10-CM

## 2023-01-27 DIAGNOSIS — Z8249 Family history of ischemic heart disease and other diseases of the circulatory system: Secondary | ICD-10-CM

## 2023-01-27 DIAGNOSIS — Z79899 Other long term (current) drug therapy: Secondary | ICD-10-CM

## 2023-01-27 DIAGNOSIS — I10 Essential (primary) hypertension: Secondary | ICD-10-CM | POA: Diagnosis present

## 2023-01-27 DIAGNOSIS — Z006 Encounter for examination for normal comparison and control in clinical research program: Secondary | ICD-10-CM

## 2023-01-27 DIAGNOSIS — Z955 Presence of coronary angioplasty implant and graft: Secondary | ICD-10-CM | POA: Diagnosis not present

## 2023-01-27 DIAGNOSIS — I252 Old myocardial infarction: Secondary | ICD-10-CM

## 2023-01-27 DIAGNOSIS — E785 Hyperlipidemia, unspecified: Secondary | ICD-10-CM | POA: Diagnosis not present

## 2023-01-27 DIAGNOSIS — I2510 Atherosclerotic heart disease of native coronary artery without angina pectoris: Secondary | ICD-10-CM

## 2023-01-27 DIAGNOSIS — Z952 Presence of prosthetic heart valve: Principal | ICD-10-CM

## 2023-01-27 DIAGNOSIS — J9811 Atelectasis: Secondary | ICD-10-CM | POA: Diagnosis not present

## 2023-01-27 DIAGNOSIS — Z954 Presence of other heart-valve replacement: Secondary | ICD-10-CM | POA: Diagnosis not present

## 2023-01-27 DIAGNOSIS — E876 Hypokalemia: Secondary | ICD-10-CM | POA: Diagnosis not present

## 2023-01-27 DIAGNOSIS — I350 Nonrheumatic aortic (valve) stenosis: Secondary | ICD-10-CM

## 2023-01-27 DIAGNOSIS — Z888 Allergy status to other drugs, medicaments and biological substances status: Secondary | ICD-10-CM | POA: Diagnosis not present

## 2023-01-27 DIAGNOSIS — I152 Hypertension secondary to endocrine disorders: Secondary | ICD-10-CM | POA: Diagnosis not present

## 2023-01-27 DIAGNOSIS — D649 Anemia, unspecified: Secondary | ICD-10-CM | POA: Diagnosis not present

## 2023-01-27 DIAGNOSIS — I110 Hypertensive heart disease with heart failure: Secondary | ICD-10-CM | POA: Diagnosis present

## 2023-01-27 DIAGNOSIS — I071 Rheumatic tricuspid insufficiency: Secondary | ICD-10-CM | POA: Diagnosis present

## 2023-01-27 DIAGNOSIS — E1159 Type 2 diabetes mellitus with other circulatory complications: Secondary | ICD-10-CM | POA: Diagnosis present

## 2023-01-27 DIAGNOSIS — I4819 Other persistent atrial fibrillation: Secondary | ICD-10-CM | POA: Diagnosis present

## 2023-01-27 HISTORY — DX: Presence of prosthetic heart valve: Z95.2

## 2023-01-27 HISTORY — PX: TEE WITHOUT CARDIOVERSION: SHX5443

## 2023-01-27 LAB — BASIC METABOLIC PANEL
Anion gap: 9 (ref 5–15)
BUN: 10 mg/dL (ref 8–23)
CO2: 29 mmol/L (ref 22–32)
Calcium: 8.5 mg/dL — ABNORMAL LOW (ref 8.9–10.3)
Chloride: 100 mmol/L (ref 98–111)
Creatinine, Ser: 0.72 mg/dL (ref 0.44–1.00)
GFR, Estimated: >60 mL/min (ref >60)
Glucose, Bld: 160 mg/dL — ABNORMAL HIGH (ref 70–99)
Potassium: 3 mmol/L — ABNORMAL LOW (ref 3.5–5.1)
Sodium: 138 mmol/L (ref 135–145)

## 2023-01-27 LAB — ECHO TEE
AR max vel: 2.61 cm2
AV Area VTI: 2.66 cm2
AV Area mean vel: 2.67 cm2
AV Mean grad: 2 mmHg
AV Peak grad: 4.4 mmHg
Ao pk vel: 1.05 m/s

## 2023-01-27 LAB — GLUCOSE, CAPILLARY: Glucose-Capillary: 123 mg/dL — ABNORMAL HIGH (ref 70–99)

## 2023-01-27 SURGERY — TRANSCATHETER AORTIC VALVE REPLACEMENT,SUBCLAVIAN
Anesthesia: Monitor Anesthesia Care | Site: Chest

## 2023-01-27 MED ORDER — ATORVASTATIN CALCIUM 10 MG PO TABS
20.0000 mg | ORAL_TABLET | Freq: Every evening | ORAL | Status: DC
  Administered 2023-01-27 – 2023-01-28 (×2): 20 mg via ORAL
  Filled 2023-01-27 (×3): qty 2

## 2023-01-27 MED ORDER — PANTOPRAZOLE SODIUM 40 MG PO TBEC
40.0000 mg | DELAYED_RELEASE_TABLET | Freq: Two times a day (BID) | ORAL | Status: DC
  Administered 2023-01-27 – 2023-01-29 (×4): 40 mg via ORAL
  Filled 2023-01-27 (×5): qty 1

## 2023-01-27 MED ORDER — HEPARIN 6000 UNIT IRRIGATION SOLUTION
Status: AC
  Filled 2023-01-27: qty 1500

## 2023-01-27 MED ORDER — IODIXANOL 320 MG/ML IV SOLN
INTRAVENOUS | Status: DC | PRN
  Administered 2023-01-27: 80 mL via INTRA_ARTERIAL

## 2023-01-27 MED ORDER — PROPOFOL 500 MG/50ML IV EMUL
INTRAVENOUS | Status: DC | PRN
  Administered 2023-01-27: 125 ug/kg/min via INTRAVENOUS
  Administered 2023-01-27: 20 ug/kg/min via INTRAVENOUS

## 2023-01-27 MED ORDER — ACETAMINOPHEN 650 MG RE SUPP: 650.0000 mg | Freq: Four times a day (QID) | RECTAL | Status: DC | PRN

## 2023-01-27 MED ORDER — MORPHINE SULFATE (PF) 2 MG/ML IV SOLN
INTRAVENOUS | Status: AC
  Filled 2023-01-27: qty 1

## 2023-01-27 MED ORDER — LIDOCAINE HCL (PF) 1 % IJ SOLN
INTRAMUSCULAR | Status: AC
  Filled 2023-01-27: qty 30

## 2023-01-27 MED ORDER — ONDANSETRON HCL 4 MG/2ML IJ SOLN: 4.0000 mg | Freq: Four times a day (QID) | INTRAMUSCULAR | Status: DC | PRN

## 2023-01-27 MED ORDER — CHLORHEXIDINE GLUCONATE 4 % EX LIQD: 60.0000 mL | Freq: Once | CUTANEOUS | Status: DC

## 2023-01-27 MED ORDER — FENTANYL CITRATE (PF) 250 MCG/5ML IJ SOLN
INTRAMUSCULAR | Status: DC | PRN
  Administered 2023-01-27 (×2): 50 ug via INTRAVENOUS

## 2023-01-27 MED ORDER — FENTANYL CITRATE (PF) 250 MCG/5ML IJ SOLN
INTRAMUSCULAR | Status: AC
  Filled 2023-01-27: qty 5

## 2023-01-27 MED ORDER — CHLORHEXIDINE GLUCONATE 4 % EX LIQD: 30.0000 mL | CUTANEOUS | Status: DC

## 2023-01-27 MED ORDER — CHLORHEXIDINE GLUCONATE 0.12 % MT SOLN: 15.0000 mL | Freq: Once | OROMUCOSAL | Status: AC

## 2023-01-27 MED ORDER — SODIUM CHLORIDE 0.9% FLUSH: 3.0000 mL | INTRAVENOUS | Status: DC | PRN

## 2023-01-27 MED ORDER — SODIUM CHLORIDE 0.9 % IV SOLN: INTRAVENOUS | Status: AC

## 2023-01-27 MED ORDER — ACETAMINOPHEN 325 MG PO TABS: 650.0000 mg | ORAL_TABLET | Freq: Four times a day (QID) | ORAL | Status: DC | PRN

## 2023-01-27 MED ORDER — SODIUM CHLORIDE 0.9 % IV SOLN: 250.0000 mL | INTRAVENOUS | Status: DC

## 2023-01-27 MED ORDER — LACTATED RINGERS IV SOLN: INTRAVENOUS | Status: DC | PRN

## 2023-01-27 MED ORDER — NITROGLYCERIN IN D5W 200-5 MCG/ML-% IV SOLN
0.0000 ug/min | INTRAVENOUS | Status: DC
  Administered 2023-01-27: 10 ug/min via INTRAVENOUS
  Administered 2023-01-27: 5 ug/min via INTRAVENOUS
  Filled 2023-01-27: qty 250

## 2023-01-27 MED ORDER — CLEVIDIPINE BUTYRATE 0.5 MG/ML IV EMUL
INTRAVENOUS | Status: DC | PRN
  Administered 2023-01-27: 2 mg/h via INTRAVENOUS

## 2023-01-27 MED ORDER — LIDOCAINE HCL 1 % IJ SOLN
INTRAMUSCULAR | Status: DC | PRN
  Administered 2023-01-27: 8 mL via INTRADERMAL

## 2023-01-27 MED ORDER — DEXAMETHASONE SODIUM PHOSPHATE 10 MG/ML IJ SOLN
INTRAMUSCULAR | Status: DC | PRN
  Administered 2023-01-27: 5 mg via INTRAVENOUS

## 2023-01-27 MED ORDER — ONDANSETRON HCL 4 MG/2ML IJ SOLN
INTRAMUSCULAR | Status: DC | PRN
  Administered 2023-01-27: 4 mg via INTRAVENOUS

## 2023-01-27 MED ORDER — PROPOFOL 10 MG/ML IV BOLUS
INTRAVENOUS | Status: DC | PRN
  Administered 2023-01-27: 60 mg via INTRAVENOUS

## 2023-01-27 MED ORDER — SODIUM CHLORIDE 0.9% FLUSH
3.0000 mL | Freq: Two times a day (BID) | INTRAVENOUS | Status: DC
  Administered 2023-01-27 – 2023-01-29 (×4): 3 mL via INTRAVENOUS

## 2023-01-27 MED ORDER — LIDOCAINE 2% (20 MG/ML) 5 ML SYRINGE
INTRAMUSCULAR | Status: DC | PRN
  Administered 2023-01-27: 60 mg via INTRAVENOUS

## 2023-01-27 MED ORDER — HYDRALAZINE HCL 50 MG PO TABS
100.0000 mg | ORAL_TABLET | Freq: Two times a day (BID) | ORAL | Status: DC
  Administered 2023-01-27 – 2023-01-29 (×5): 100 mg via ORAL
  Filled 2023-01-27 (×5): qty 2

## 2023-01-27 MED ORDER — ROCURONIUM BROMIDE 10 MG/ML (PF) SYRINGE
PREFILLED_SYRINGE | INTRAVENOUS | Status: DC | PRN
  Administered 2023-01-27: 60 mg via INTRAVENOUS

## 2023-01-27 MED ORDER — HEPARIN SODIUM (PORCINE) 1000 UNIT/ML IJ SOLN
INTRAMUSCULAR | Status: DC | PRN
  Administered 2023-01-27: 10000 [IU] via INTRAVENOUS

## 2023-01-27 MED ORDER — PROTAMINE SULFATE 10 MG/ML IV SOLN
INTRAVENOUS | Status: DC | PRN
  Administered 2023-01-27: 100 mg via INTRAVENOUS

## 2023-01-27 MED ORDER — ONDANSETRON HCL 4 MG/2ML IJ SOLN
INTRAMUSCULAR | Status: AC
  Filled 2023-01-27: qty 2

## 2023-01-27 MED ORDER — CHLORHEXIDINE GLUCONATE 0.12 % MT SOLN
OROMUCOSAL | Status: AC
  Administered 2023-01-27: 15 mL via OROMUCOSAL
  Filled 2023-01-27: qty 15

## 2023-01-27 MED ORDER — OXYCODONE HCL 5 MG PO TABS
5.0000 mg | ORAL_TABLET | ORAL | Status: DC | PRN
  Administered 2023-01-27: 10 mg via ORAL

## 2023-01-27 MED ORDER — SODIUM CHLORIDE 0.9 % IV SOLN: 250.0000 mL | INTRAVENOUS | Status: DC | PRN

## 2023-01-27 MED ORDER — SERTRALINE HCL 25 MG PO TABS
25.0000 mg | ORAL_TABLET | Freq: Every day | ORAL | Status: DC
  Administered 2023-01-27: 25 mg via ORAL
  Filled 2023-01-27 (×3): qty 1

## 2023-01-27 MED ORDER — HEPARIN 6000 UNIT IRRIGATION SOLUTION
Status: AC
  Filled 2023-01-27: qty 500

## 2023-01-27 MED ORDER — PROPOFOL 10 MG/ML IV BOLUS
INTRAVENOUS | Status: AC
  Filled 2023-01-27: qty 20

## 2023-01-27 MED ORDER — CEFAZOLIN SODIUM-DEXTROSE 2-4 GM/100ML-% IV SOLN
2.0000 g | Freq: Three times a day (TID) | INTRAVENOUS | Status: AC
  Administered 2023-01-27 – 2023-01-28 (×2): 2 g via INTRAVENOUS
  Filled 2023-01-27 (×2): qty 100

## 2023-01-27 MED ORDER — OXYCODONE HCL 5 MG PO TABS
ORAL_TABLET | ORAL | Status: AC
  Filled 2023-01-27: qty 2

## 2023-01-27 MED ORDER — SUGAMMADEX SODIUM 200 MG/2ML IV SOLN
INTRAVENOUS | Status: DC | PRN
  Administered 2023-01-27: 200 mg via INTRAVENOUS

## 2023-01-27 MED ORDER — TRAMADOL HCL 50 MG PO TABS: 50.0000 mg | ORAL_TABLET | ORAL | Status: DC | PRN

## 2023-01-27 MED ORDER — SODIUM CHLORIDE 0.9 % IV SOLN: INTRAVENOUS | Status: DC

## 2023-01-27 MED ORDER — MORPHINE SULFATE (PF) 2 MG/ML IV SOLN
1.0000 mg | INTRAVENOUS | Status: DC | PRN
  Administered 2023-01-27 (×2): 2 mg via INTRAVENOUS
  Filled 2023-01-27: qty 1

## 2023-01-27 MED ORDER — HEPARIN 6000 UNIT IRRIGATION SOLUTION
Status: DC | PRN
  Administered 2023-01-27: 3

## 2023-01-27 MED ORDER — APIXABAN 2.5 MG PO TABS: 2.5000 mg | ORAL_TABLET | Freq: Two times a day (BID) | ORAL | Status: DC

## 2023-01-27 SURGICAL SUPPLY — 87 items
ADH SKN CLS APL DERMABOND .7 (GAUZE/BANDAGES/DRESSINGS) ×2
BAG COUNTER SPONGE SURGICOUNT (BAG) ×3 IMPLANT
BAG DECANTER FOR FLEXI CONT (MISCELLANEOUS) IMPLANT
BAG SNAP BAND KOVER 36X36 (MISCELLANEOUS) ×3 IMPLANT
BAG SPNG CNTER NS LX DISP (BAG) ×2
BLADE CLIPPER SURG (BLADE) IMPLANT
BLADE OSCILLATING /SAGITTAL (BLADE) IMPLANT
BLADE STERNUM SYSTEM 6 (BLADE) IMPLANT
CABLE ADAPT CONN TEMP 6FT (ADAPTER) ×3 IMPLANT
CANNULA FEM VENOUS REMOTE 22FR (CANNULA) IMPLANT
CANNULA OPTISITE PERFUSION 16F (CANNULA) IMPLANT
CANNULA OPTISITE PERFUSION 18F (CANNULA) IMPLANT
CATH DIAG EXPO 6F AL1 (CATHETERS) ×2 IMPLANT
CATH DIAG EXPO 6F VENT PIG 145 (CATHETERS) ×6 IMPLANT
CATH EXTERNAL FEMALE PUREWICK (CATHETERS) IMPLANT
CATH INFINITI 6F AL2 (CATHETERS) IMPLANT
CATH S G BIP PACING (CATHETERS) ×3 IMPLANT
CLOSURE MYNX CONTROL 6F/7F (Vascular Products) ×2 IMPLANT
CNTNR URN SCR LID CUP LEK RST (MISCELLANEOUS) ×6 IMPLANT
CONT SPEC 4OZ STRL OR WHT (MISCELLANEOUS) ×4
COVER BACK TABLE 80X110 HD (DRAPES) ×6 IMPLANT
COVER DOME SNAP 22 D (MISCELLANEOUS) ×2 IMPLANT
DERMABOND ADVANCED .7 DNX12 (GAUZE/BANDAGES/DRESSINGS) ×3 IMPLANT
DRAPE FEMORAL ANGIO 80X135IN (DRAPES) ×4 IMPLANT
DRAPE INCISE IOBAN 66X45 STRL (DRAPES) IMPLANT
DRSG TEGADERM 4X4.75 (GAUZE/BANDAGES/DRESSINGS) ×6 IMPLANT
ELECT CAUTERY BLADE 6.4 (BLADE) IMPLANT
ELECT REM PT RETURN 9FT ADLT (ELECTROSURGICAL) ×4
ELECTRODE REM PT RTRN 9FT ADLT (ELECTROSURGICAL) ×2 IMPLANT
FELT TEFLON 6X6 (MISCELLANEOUS) IMPLANT
FEMORAL VENOUS CANN RAP (CANNULA) IMPLANT
GAUZE SPONGE 4X4 12PLY STRL (GAUZE/BANDAGES/DRESSINGS) ×3 IMPLANT
GLOVE BIO SURGEON STRL SZ7.5 (GLOVE) ×6 IMPLANT
GLOVE BIOGEL PI IND STRL 6.5 (GLOVE) ×8 IMPLANT
GLOVE ECLIPSE 7.0 STRL STRAW (GLOVE) ×5 IMPLANT
GLOVE EUDERMIC 7 POWDERFREE (GLOVE) IMPLANT
GOWN STRL REUS W/ TWL LRG LVL3 (GOWN DISPOSABLE) IMPLANT
GOWN STRL REUS W/ TWL XL LVL3 (GOWN DISPOSABLE) ×3 IMPLANT
GOWN STRL REUS W/TWL LRG LVL3 (GOWN DISPOSABLE)
GOWN STRL REUS W/TWL XL LVL3 (GOWN DISPOSABLE) ×2
GUIDEWIRE SAFE TJ AMPLATZ EXST (WIRE) ×3 IMPLANT
KIT BASIN OR (CUSTOM PROCEDURE TRAY) ×3 IMPLANT
KIT DILATOR VASC 18G NDL (KITS) IMPLANT
KIT HEART LEFT (KITS) ×3 IMPLANT
KIT SAPIAN 3 ULTRA RESILIA 23 (Valve) ×2 IMPLANT
KIT SUCTION CATH 14FR (SUCTIONS) IMPLANT
KIT TURNOVER KIT B (KITS) ×3 IMPLANT
LOOP VESSEL MAXI BLUE (MISCELLANEOUS) ×3 IMPLANT
LOOP VESSEL MINI RED (MISCELLANEOUS) IMPLANT
NDL 22X1.5 STRL (OR ONLY) (MISCELLANEOUS) IMPLANT
NDL PERC 18GX7CM (NEEDLE) ×3 IMPLANT
NEEDLE 22X1.5 STRL (OR ONLY) (MISCELLANEOUS)
NEEDLE PERC 18GX7CM (NEEDLE) ×2
NS IRRIG 1000ML POUR BTL (IV SOLUTION) ×9 IMPLANT
PACK ENDOVASCULAR (PACKS) ×3 IMPLANT
PAD ARMBOARD 7.5X6 YLW CONV (MISCELLANEOUS) ×6 IMPLANT
PAD ELECT DEFIB RADIOL ZOLL (MISCELLANEOUS) ×3 IMPLANT
PENCIL BUTTON HOLSTER BLD 10FT (ELECTRODE) ×3 IMPLANT
POSITIONER HEAD DONUT 9IN (MISCELLANEOUS) ×3 IMPLANT
SHEATH BRITE TIP 7FR 35CM (SHEATH) ×3 IMPLANT
SHEATH PINNACLE 6F 10CM (SHEATH) ×3 IMPLANT
SHEATH PINNACLE 8F 10CM (SHEATH) ×3 IMPLANT
SLEEVE REPOSITIONING LENGTH 30 (MISCELLANEOUS) ×3 IMPLANT
SPONGE T-LAP 18X18 ~~LOC~~+RFID (SPONGE) ×3 IMPLANT
SPONGE T-LAP 4X18 ~~LOC~~+RFID (SPONGE) IMPLANT
STOPCOCK MORSE 400PSI 3WAY (MISCELLANEOUS) ×6 IMPLANT
SUT ETHIBOND X763 2 0 SH 1 (SUTURE) IMPLANT
SUT GORETEX CV 4 TH 22 36 (SUTURE) ×3 IMPLANT
SUT GORETEX CV4 TH-18 (SUTURE) ×6 IMPLANT
SUT PROLENE 5 0 C 1 36 (SUTURE) IMPLANT
SUT SILK 2 0 TIES 10X30 (SUTURE) ×3 IMPLANT
SUT SILK 3 0 SH CR/8 (SUTURE) ×3 IMPLANT
SUT SILK 3 0 TIES 10X30 (SUTURE) ×3 IMPLANT
SUT VIC AB 2-0 CT1 27 (SUTURE) ×2
SUT VIC AB 2-0 CT1 TAPERPNT 27 (SUTURE) ×3 IMPLANT
SUT VIC AB 3-0 SH 27 (SUTURE) ×2
SUT VIC AB 3-0 SH 27X BRD (SUTURE) ×3 IMPLANT
SUT VIC AB 3-0 X1 27 (SUTURE) ×3 IMPLANT
SYR 50ML LL SCALE MARK (SYRINGE) ×3 IMPLANT
SYR BULB IRRIG 60ML STRL (SYRINGE) IMPLANT
TOWEL GREEN STERILE (TOWEL DISPOSABLE) ×3 IMPLANT
TOWEL GREEN STERILE FF (TOWEL DISPOSABLE) ×3 IMPLANT
TRANSDUCER W/STOPCOCK (MISCELLANEOUS) ×6 IMPLANT
TRAY FOLEY SLVR 16FR TEMP STAT (SET/KITS/TRAYS/PACK) IMPLANT
WIRE EMERALD 3MM-J .035X150CM (WIRE) ×3 IMPLANT
WIRE EMERALD 3MM-J .035X260CM (WIRE) ×3 IMPLANT
WIRE EMERALD ST .035X260CM (WIRE) ×6 IMPLANT

## 2023-01-27 NOTE — Progress Notes (Signed)
  [facility] VALVE TEAM  Patient doing well s/p TAVR. She is hemodynamically stable. Groin sites stable. ECG with no high grade block. BP elevated in cath lab holding. Home hydralazine restarted. Plan for transfer to 4E. Early ambulation after bedrest completed and hopeful discharge over the next 24-48 hours.   Kathyrn Drown NP-C Structural Heart Team  Pager: [phone] Phone: [phone]

## 2023-01-27 NOTE — Discharge Summary (Addendum)
[facility] TEAM  Discharge Summary    Patient ID: [name] MRN: [id number]; DOB: 07-Mar-1936  Admit date: 01/27/2023 Discharge date: 01/29/2023  Primary Care Provider: Baruch Gouty, FNP  Primary Cardiologist: Glenetta Hew, MD / Dr. Angelena Form, MD and Dr. Cyndia Bent, MD (TAVR)  Discharge Diagnoses    Principal Problem:   S/P TAVR (transcatheter aortic valve replacement) Active Problems:   Hypertension associated with diabetes ([place])   Hyperlipidemia associated with type 2 diabetes mellitus ([place])   Severe calcific aortic valve stenosis   CAD S/P percutaneous coronary angioplasty   Persistent atrial fibrillation (HCC)   Anemia   Hypomagnesemia  Allergies Allergies  Allergen Reactions   Valium Nausea And Vomiting   Diagnostic Studies/Procedures    [facility]  TAVR OPERATIVE NOTE     Date of Procedure:                01/27/2023   Preoperative Diagnosis:      Severe Aortic Stenosis    Postoperative Diagnosis:    Same    Procedure:        Transcatheter Aortic Valve Replacement - TransSubclavian Approach             Edwards Sapien 3 THV (size 23 mm, model # [id number], serial # [id number] )              Co-Surgeons:                        Lauree Chandler, MD and Gilford Raid , MD    Anesthesiologist:                  Tobias Alexander   Echocardiographer:              Croitoru   Pre-operative Echo Findings: Severe aortic stenosis Normal left ventricular systolic function   Post-operative Echo Findings: No paravalvular leak Normal left ventricular systolic function  _____________   Echo 01/28/23:    1. Left ventricular ejection fraction, by estimation, is 65 to 70%. The  left ventricle has normal function. The left ventricle has no regional  wall motion abnormalities. There is mild concentric left ventricular  hypertrophy. Left ventricular diastolic  function could not be evaluated. There is the  interventricular septum is  flattened in diastole ('D' shaped left ventricle), consistent with right  ventricular volume overload.   2. Right ventricular systolic function is mildly reduced. The right  ventricular size is moderately enlarged. There is mildly elevated  pulmonary artery systolic pressure. The estimated right ventricular  systolic pressure is 0000000 mmHg.   3. Left atrial size was severely dilated.   4. Right atrial size was severely dilated.   5. The mitral valve is normal in structure. Mild mitral valve  regurgitation.   6. The tricuspid valve is abnormal. Tricuspid valve regurgitation is  severe.   7. The aortic valve has been repaired/replaced. Aortic valve  regurgitation is not visualized. There is a 23 mm Sapien prosthetic (TAVR)  valve present in the aortic position. Procedure Date: 01/27/2023. Echo  findings are consistent with normal structure  and function of the aortic valve prosthesis. Aortic valve mean gradient  measures 6.0 mmHg. Aortic valve Vmax measures 1.69 m/s. Aortic valve  acceleration time measures 53 msec.   8. The inferior vena cava is dilated in size with <50% respiratory  variability, suggesting right atrial pressure of 15 mmHg.   Comparison(s): Prior  images reviewed side by side. Tricuspid insufficiency  is worse.   History of Present Illness     [name] is a 87 y.o. female with a history of persistent atrial fibrillation, CAD with prior PCI, chronic diastolic CHF, HTN, HLD, DM diet controlled and severe aortic stenosis who presented to [facility] for planned TAVR.    [name] has CAD and had a bare metal stent placed in the RCA in 1999 and another distal bare metal stent placed in the distal RCA in 2012. Last cardiac cath in 2012 with severe in stent restenosis in distal RCA stent treated with cutting balloon angioplasty. She has been followed over the past year for severe aortic stenosis. Echo 11/2021 with normal LV function, mild MR and  severe low flow/low gradient AS with mean gradient 27 mmHg, AVA 0.8 cm2. She was called at that time and referral to the structural heart clinic was made but she had other family issues and did not schedule this appt. Most recent echo 06/20/22 with LVEF=65-70%, mild MR as well as severe low flow/low gradient AS with mean gradient 27 mmHg, AVA 0.72 cm2, DI 0.23, SVI 34. She had a recent upper endoscopy and was found to have gastritis with no ulcer.     She was seen by Dr. Angelena Form in the structural heart clinic for TAVR work up on 10/06/22. Plan was to proceed with [facility] but the patient wanted to go home and discuss this with her family. She eventually underwent cardiac catheterization which showed non-obstructive CAD with widely patent bare-metal stents in the mid and distal RCA, and trivial disease in the LCx system.   CT imaging performed with no preclusive issues. The patient was evaluated by the multidisciplinary valve team and felt to have severe, symptomatic aortic stenosis and to be a suitable candidate for TAVR, which was set up for 01/27/23.    Hospital Course   Severe AS: s/p successful TAVR with a 23 mm Edwards Sapien 3 THV via the [place] approach on 01/27/23. Post operative echo showed stable valve function with worsening TR however likely due to acute volume overload with PRBCs. She was given IV Lasix in between units. Groin sites are stable. Left anterior chest surgical site appears stable with no oozing, redness, or swelling. ECG with NSR and no high grade heart block. Post procedure instructions reviewed with understanding. The patient has ambulated with CRI I without complication today. She will require lifelong dental SBE which will be RX'ed at [facility] follow up with myself then one month OV with echo.   Persistent atrial fibrillation: Eliquis held due to anemia. She will resume this evening. Rates have been stable.   Anemia: As above, CBC returned with a Hb at 6.3 however no other overt s/s of  acute bleeding. Patient received 2u PRBCs with improvement in Hb to 8.7.  Felt to be dilutional per Dr. Cyndia Bent.   CAD with prior PCI: Denies anginal symptoms. Pre TAVR cath with patent stents and no occlusive disease. No ASA given Eliquis.   Chronic diastolic CHF: Appears euvolic on exam today. Given one dose IV lasix between PRBCs. Resume home regimen once discharged.   HTN: Stable with no changes needed at this time.   DM (diet controlled): SSI while inpatient. Continue to folow with   Incidental findings: Moderate right and small left pleural effusions with areas of passive subsegmental atelectasis in the lower lungs bilaterally (right greater than left). Cirrhosis with moderate volume ascites.  Hypomagnesemia: Replaced with 2g  IV Mag. Repeat BMET at [facility] follow up.   Hypokalemia: Replaced with PO Kdur. Will recheck at follow up next week.   Consultants: None    The patient was seen and examined by Dr. Cyndia Bent who feels that she is stable and ready for discharge today, 01/28/23.  _____________  Discharge Vitals Blood pressure 134/62, pulse 97, temperature 98.3 F (36.8 C), temperature source Oral, resp. rate 17, height 5' 5.5" (1.664 m), weight 63 kg, SpO2 95 %.  Filed Weights   01/26/23 1000 01/27/23 0811 01/29/23 0500  Weight: 64 kg 64 kg 63 kg   General: Well developed, well nourished, NAD Neck: Negative for carotid bruits. No JVD Lungs:Clear to ausculation bilaterally. No wheezes, rales, or rhonchi. Breathing is unlabored. Cardiovascular: RRR with S1 S2. No murmurs Abdomen: Soft, non-tender, non-distended. No rebound/guarding. No obvious abdominal masses. Extremities: No edema. Bilateral groin sites stable with no bleeding or hematoma. Neuro: Alert and oriented. No focal deficits. No facial asymmetry. MAE spontaneously. Psych: Responds to questions appropriately with normal affect.    Labs & Radiologic Studies    CBC Recent Labs    01/28/23 0950 01/29/23 0059  WBC 7.2  7.2  HGB 7.8* 8.7*  HCT 23.6* 26.2*  MCV 87.4 88.5  PLT 172 [id number]*   Basic Metabolic Panel Recent Labs    01/28/23 0203 01/29/23 0059  NA 136 134*  K 2.9* 3.3*  CL 102 98  CO2 24 26  GLUCOSE 125* 128*  BUN 17 16  CREATININE 0.88 0.98  CALCIUM 8.0* 8.2*  MG 1.3*  --    Liver Function Tests No results for input(s): "AST", "ALT", "ALKPHOS", "BILITOT", "PROT", "ALBUMIN" in the last 72 hours. No results for input(s): "LIPASE", "AMYLASE" in the last 72 hours. Cardiac Enzymes No results for input(s): "CKTOTAL", "CKMB", "CKMBINDEX", "TROPONINI" in the last 72 hours. BNP Invalid input(s): "POCBNP" D-Dimer No results for input(s): "DDIMER" in the last 72 hours. Hemoglobin A1C No results for input(s): "HGBA1C" in the last 72 hours. Fasting Lipid Panel No results for input(s): "CHOL", "HDL", "LDLCALC", "TRIG", "CHOLHDL", "LDLDIRECT" in the last 72 hours. Thyroid Function Tests No results for input(s): "TSH", "T4TOTAL", "T3FREE", "THYROIDAB" in the last 72 hours.  Invalid input(s): "FREET3" _____________  ECHOCARDIOGRAM COMPLETE  Result Date: 01/28/2023    ECHOCARDIOGRAM REPORT   Patient Name:   KENJA POLKINGHORNE Date of Exam: 01/28/2023 Medical Rec #:  [id number]         Height:       65.5 in Accession #:    [id number]        Weight:       141.1 lb Date of Birth:  1936-04-20         BSA:          1.715 m Patient Age:    47 years          BP:           118/60 mmHg Patient Gender: F                 HR:           79 bpm. Exam Location:  Inpatient Procedure: 2D Echo, Cardiac Doppler and Color Doppler Indications:    I35.0 Nonrheumatic aortic (valve) stenosis  History:        Patient has prior history of Echocardiogram examinations, most                 recent 01/27/2023. CHF, CAD, Abnormal ECG, Aortic Valve Disease,  Arrythmias:Atrial Fibrillation; Risk Factors:Dyslipidemia and                 Hypertension. TAVR procedure.                 Aortic Valve: 23 mm Sapien prosthetic, stented  (TAVR) valve is                 present in the aortic position. Procedure Date: 01/27/2023.  Sonographer:    Roseanna Rainbow RDCS Referring Phys: Alphonsa Overall Sonyia Muro IMPRESSIONS  1. Left ventricular ejection fraction, by estimation, is 65 to 70%. The left ventricle has normal function. The left ventricle has no regional wall motion abnormalities. There is mild concentric left ventricular hypertrophy. Left ventricular diastolic function could not be evaluated. There is the interventricular septum is flattened in diastole ('D' shaped left ventricle), consistent with right ventricular volume overload.  2. Right ventricular systolic function is mildly reduced. The right ventricular size is moderately enlarged. There is mildly elevated pulmonary artery systolic pressure. The estimated right ventricular systolic pressure is 0000000 mmHg.  3. Left atrial size was severely dilated.  4. Right atrial size was severely dilated.  5. The mitral valve is normal in structure. Mild mitral valve regurgitation.  6. The tricuspid valve is abnormal. Tricuspid valve regurgitation is severe.  7. The aortic valve has been repaired/replaced. Aortic valve regurgitation is not visualized. There is a 23 mm Sapien prosthetic (TAVR) valve present in the aortic position. Procedure Date: 01/27/2023. Echo findings are consistent with normal structure and function of the aortic valve prosthesis. Aortic valve mean gradient measures 6.0 mmHg. Aortic valve Vmax measures 1.69 m/s. Aortic valve acceleration time measures 53 msec.  8. The inferior vena cava is dilated in size with <50% respiratory variability, suggesting right atrial pressure of 15 mmHg. Comparison(s): Prior images reviewed side by side. Tricuspid insufficiency is worse. FINDINGS  Left Ventricle: Left ventricular ejection fraction, by estimation, is 65 to 70%. The left ventricle has normal function. The left ventricle has no regional wall motion abnormalities. The left ventricular internal cavity size  was normal in size. There is  mild concentric left ventricular hypertrophy. The interventricular septum is flattened in diastole ('D' shaped left ventricle), consistent with right ventricular volume overload. Left ventricular diastolic function could not be evaluated due to atrial fibrillation. Left ventricular diastolic function could not be evaluated. Right Ventricle: The right ventricular size is moderately enlarged. No increase in right ventricular wall thickness. Right ventricular systolic function is mildly reduced. There is mildly elevated pulmonary artery systolic pressure. The tricuspid regurgitant velocity is 2.66 m/s, and with an assumed right atrial pressure of 15 mmHg, the estimated right ventricular systolic pressure is 0000000 mmHg. Left Atrium: Left atrial size was severely dilated. Right Atrium: Right atrial size was severely dilated. Pericardium: Trivial pericardial effusion is present. The pericardial effusion is localized near the right atrium. Mitral Valve: The mitral valve is normal in structure. Mild mitral annular calcification. Mild mitral valve regurgitation, with centrally-directed jet. Tricuspid Valve: There is severe leaflet malcoaptation. The tricuspid valve is abnormal. Tricuspid valve regurgitation is severe. Aortic Valve: The aortic valve has been repaired/replaced. Aortic valve regurgitation is not visualized. Aortic valve mean gradient measures 6.0 mmHg. Aortic valve peak gradient measures 11.4 mmHg. Aortic valve area, by VTI measures 2.92 cm. There is a 23 mm Sapien prosthetic, stented (TAVR) valve present in the aortic position. Procedure Date: 01/27/2023. Echo findings are consistent with normal structure and function of the aortic valve prosthesis. Pulmonic  Valve: The pulmonic valve was grossly normal. Pulmonic valve regurgitation is not visualized. No evidence of pulmonic stenosis. Aorta: The aortic root is normal in size and structure. Venous: The inferior vena cava is dilated in  size with less than 50% respiratory variability, suggesting right atrial pressure of 15 mmHg. The inferior vena cava and the hepatic vein show a pattern of systolic flow reversal, suggestive of tricuspid regurgitation. IAS/Shunts: No atrial level shunt detected by color flow Doppler.  LEFT VENTRICLE PLAX 2D LVIDd:         3.40 cm LVIDs:         2.10 cm LV PW:         1.30 cm LV IVS:        1.00 cm LVOT diam:     2.40 cm LV SV:         85 LV SV Index:   50 LVOT Area:     4.52 cm  RIGHT VENTRICLE            IVC RV S prime:     8.92 cm/s  IVC diam: 2.40 cm TAPSE (M-mode): 1.0 cm LEFT ATRIUM              Index        RIGHT ATRIUM           Index LA diam:        5.30 cm  3.09 cm/m   RA Area:     34.50 cm LA Vol (A2C):   111.0 ml 64.71 ml/m  RA Volume:   143.00 ml 83.37 ml/m LA Vol (A4C):   85.4 ml  49.79 ml/m LA Biplane Vol: 103.0 ml 60.05 ml/m  AORTIC VALVE AV Area (Vmax):    2.84 cm AV Area (Vmean):   2.84 cm AV Area (VTI):     2.92 cm AV Vmax:           169.00 cm/s AV Vmean:          111.000 cm/s AV VTI:            0.291 m AV Peak Grad:      11.4 mmHg AV Mean Grad:      6.0 mmHg LVOT Vmax:         106.00 cm/s LVOT Vmean:        69.800 cm/s LVOT VTI:          0.188 m LVOT/AV VTI ratio: 0.65  AORTA Ao Root diam: 3.20 cm Ao Asc diam:  3.30 cm MITRAL VALVE                TRICUSPID VALVE MV Area (PHT): 3.72 cm     TR Peak grad:   28.3 mmHg MV Decel Time: 204 msec     TR Vmax:        266.00 cm/s MV E velocity: 120.00 cm/s                             SHUNTS                             Systemic VTI:  0.19 m                             Systemic Diam: 2.40 cm Dani Gobble Croitoru MD Electronically signed by Sanda Klein MD Signature Date/Time: 01/28/2023/12:00:39 PM  Final    ECHO TEE  Result Date: 01/27/2023    TRANSESOPHOGEAL ECHO REPORT   Patient Name:   ISABEL VANDERGRIFT Date of Exam: 01/27/2023 Medical Rec #:  [id number]         Height:       65.5 in Accession #:    [id number]        Weight:       141.1 lb Date of  Birth:  [birth year]         BSA:          1.715 m Patient Age:    60 years          BP:           173/78 mmHg Patient Gender: F                 HR:           95 bpm. Exam Location:  Inpatient Procedure: Transesophageal Echo, Color Doppler and Cardiac Doppler Indications:     Aortic stenosis, severe  History:         Patient has prior history of Echocardiogram examinations, most                  recent 06/20/2022. CAD and Previous Myocardial Infarction,                  Aortic Valve Disease, Arrythmias:Atrial Fibrillation; Risk                  Factors:Hypertension, Diabetes and Dyslipidemia.                  Aortic Valve: 23 mm Sapien prosthetic, stented (TAVR) valve is                  present in the aortic position. Procedure Date: 01/27/2023.  Sonographer:     Darlina Sicilian RDCS Referring Phys:  [place] Diagnosing Phys: Sanda Klein MD PROCEDURE: After discussion of the risks and benefits of a TEE, an informed consent was obtained from the patient. The patient was intubated. The transesophogeal probe was passed without difficulty through the esophogus of the patient. Imaged were obtained with the patient in a supine position. Sedation performed by different physician. The patient was monitored while under deep sedation. Anesthestetic sedation was provided intravenously by Anesthesiology: 24m of Propofol, 690mof Lidocaine. Image quality was good. The patient developed Respiratory depression and no complications during the procedure.   PRE-PROCEDURE FINDINGS Normal left ventricular systolic function and regional wall motion. Estimated LVEF 60% Trileaflet aortic valve with severe calcific stenosis. Aortic valve peak gradient 34 mm Hg, mean gradient 18 mm Hg, dimensionless index 0.28, calculated valve area 0.88 cm sq (0.51 cm sq/m sq BSA). Severe paradoxical low flow, low gradient aortic stenosis. Trivial aortic insufficiency. There is mild mitral insufficiency. There is severe tricuspid  insufficiency. Trivial circumferential pericardial effusion. POST-PROCEDURE FINDINGS Normal left ventricular systolic function and regional wall motion. Estimated LVEF 65% Well deployed 23 mm Edwards S3U stent valve (TAVR). Aortic valve peak gradient 4 mm Hg, mean gradient 2 mm Hg, dimensionless index 0.92, calculated valve area 2.89 cm sq (1.68 cm sq/m sq BSA), acceleration time 92 ms. Trivial intravalvular leak, towards the atrial septum aspect of the valve, clearly seen as originating from the commisure between the neo-left and neo-noncoronary cusps. Tricuspid insufficiency remains unchanged, severe. Unchanged trivial pericardial effusion.  IMPRESSIONS  1. Left ventricular ejection fraction, by estimation, is 60 to  65%. The left ventricle has normal function. The left ventricle has no regional wall motion abnormalities. There is mild concentric left ventricular hypertrophy. Left ventricular diastolic function could not be evaluated.  2. Right ventricular systolic function is mildly reduced. The right ventricular size is mildly enlarged. There is moderately elevated pulmonary artery systolic pressure. The estimated right ventricular systolic pressure is 123456 mmHg.  3. Left atrial size was severely dilated. No left atrial/left atrial appendage thrombus was detected.  4. Right atrial size was severely dilated. Dilated coronary sinus.  5. The mitral valve is grossly normal. Mild mitral valve regurgitation. No evidence of mitral stenosis.  6. The tricuspid valve is abnormal. Tricuspid valve regurgitation is severe.  7. The aortic valve is tricuspid. There is severe calcifcation of the aortic valve. There is severe thickening of the aortic valve. Aortic valve regurgitation is trivial. Severe aortic valve stenosis. There is a 23 mm Sapien prosthetic (TAVR) valve present in the aortic position. Procedure Date: 01/27/2023. Aortic valve mean gradient measures 2.0 mmHg. Aortic valve Vmax measures 1.05 m/s. Aortic valve  acceleration time measures 92 msec.  8. There is mild (Grade II) layered plaque involving the descending aorta. FINDINGS  Left Ventricle: Left ventricular ejection fraction, by estimation, is 60 to 65%. The left ventricle has normal function. The left ventricle has no regional wall motion abnormalities. The left ventricular internal cavity size was normal in size. There is  mild concentric left ventricular hypertrophy. Left ventricular diastolic function could not be evaluated due to atrial fibrillation. Left ventricular diastolic function could not be evaluated. Right Ventricle: The right ventricular size is mildly enlarged. No increase in right ventricular wall thickness. Right ventricular systolic function is mildly reduced. There is moderately elevated pulmonary artery systolic pressure. The tricuspid regurgitant velocity is 2.92 m/s, and with an assumed right atrial pressure of 15 mmHg, the estimated right ventricular systolic pressure is 123456 mmHg. Left Atrium: Left atrial size was severely dilated. No left atrial/left atrial appendage thrombus was detected. Right Atrium: Right atrial size was severely dilated. Dilated coronary sinus. Pericardium: Trivial pericardial effusion is present. Mitral Valve: The mitral valve is grossly normal. Mild mitral valve regurgitation, with centrally-directed jet. No evidence of mitral valve stenosis. Tricuspid Valve: There is poor leaflet coaptation, especially between the anterior and posterior leaflets. The tricuspid valve is abnormal. Tricuspid valve regurgitation is severe. No evidence of tricuspid stenosis. Aortic Valve: The aortic valve is tricuspid. There is severe calcifcation of the aortic valve. There is severe thickening of the aortic valve. Aortic valve regurgitation is trivial. Severe aortic stenosis is present. Aortic valve mean gradient measures 2.0 mmHg. Aortic valve peak gradient measures 4.4 mmHg. Aortic valve area, by VTI measures 2.66 cm. There is a 23 mm  Sapien prosthetic, stented (TAVR) valve present in the aortic position. Procedure Date: 01/27/2023. Pulmonic Valve: The pulmonic valve was grossly normal. Pulmonic valve regurgitation is trivial. No evidence of pulmonic stenosis. Aorta: The aortic root and ascending aorta are structurally normal, with no evidence of dilitation and the aortic root, ascending aorta and aortic arch are all structurally normal, with no evidence of dilitation or obstruction. There is mild (Grade II) layered plaque involving the descending aorta. IAS/Shunts: No atrial level shunt detected by color flow Doppler. Additional Comments: There is a small pleural effusion in the left lateral region. LEFT VENTRICLE PLAX 2D LVOT diam:     2.00 cm LV SV:         53 LV SV Index:   31  LVOT Area:     3.14 cm  AORTIC VALVE AV Area (Vmax):    2.61 cm AV Area (Vmean):   2.67 cm AV Area (VTI):     2.66 cm AV Vmax:           105.00 cm/s AV Vmean:          68.500 cm/s AV VTI:            0.198 m AV Peak Grad:      4.4 mmHg AV Mean Grad:      2.0 mmHg LVOT Vmax:         87.15 cm/s LVOT Vmean:        58.166 cm/s LVOT VTI:          0.167 m LVOT/AV VTI ratio: 0.85 TRICUSPID VALVE TR Peak grad:   34.1 mmHg TR Vmax:        292.00 cm/s  SHUNTS Systemic VTI:  0.17 m Systemic Diam: 2.00 cm Sanda Klein MD Electronically signed by Sanda Klein MD Signature Date/Time: 01/27/2023/1:51:47 PM    Final    EP STUDY  Result Date: 01/27/2023 See surgical note for result.  Structural Heart Procedure  Result Date: 01/27/2023 See surgical note for result.  DG Chest 2 View  Result Date: 01/23/2023 CLINICAL DATA:  Preadmit for TAVR procedure. History of aortic stenosis, shortness of breath. EXAM: CHEST - 2 VIEW COMPARISON:  Chest x-ray dated 11/17/2022. Chest CT dated 12/03/2022. FINDINGS: Persistent opacity at the RIGHT lung base, likely a combination of atelectasis and pleural effusion. Probable small LEFT pleural effusion. Upper lungs are clear. No evidence of  consolidating pneumonia. No pneumothorax is seen. Cardiomegaly. Atherosclerosis of the thoracic aorta. No acute-appearing osseous abnormality. IMPRESSION: 1. Persistent opacity at the RIGHT lung base, likely a combination of atelectasis and pleural effusion, as also demonstrated on the chest CT of 12/03/2022. 2. Probable small LEFT pleural effusion. 3. Cardiomegaly. 4. Aortic atherosclerosis. Electronically Signed   By: Franki Cabot M.D.   On: 01/23/2023 11:34   Disposition   Pt is being discharged home today in good condition.  Follow-up Plans & Appointments    Follow-up Information     Tommie Raymond, NP Follow up on 02/06/2023.   Specialty: Cardiology Why: @ Herbert Seta arrive to your appointment at 1045am. Contact information: [address] [place] [address] [phone]                Discharge Instructions     Amb Referral to Cardiac Rehabilitation   Complete by: As directed    Diagnosis: Valve Replacement   Valve: Aortic   After initial evaluation and assessments completed: Virtual Based Care may be provided alone or in conjunction with Phase 2 Cardiac Rehab based on patient barriers.: Yes   Intensive Cardiac Rehabilitation (ICR) [place] location only OR Traditional Cardiac Rehabilitation (TCR) *If criteria for ICR are not met will enroll in TCR [facility] only): Yes   Call MD for:  difficulty breathing, headache or visual disturbances   Complete by: As directed    Call MD for:  difficulty breathing, headache or visual disturbances   Complete by: As directed    Call MD for:  extreme fatigue   Complete by: As directed    Call MD for:  extreme fatigue   Complete by: As directed    Call MD for:  hives   Complete by: As directed    Call MD for:  hives   Complete by: As  directed    Call MD for:  persistant dizziness or light-headedness   Complete by: As directed    Call MD for:  persistant dizziness or light-headedness   Complete by: As directed    Call MD  for:  persistant nausea and vomiting   Complete by: As directed    Call MD for:  persistant nausea and vomiting   Complete by: As directed    Call MD for:  redness, tenderness, or signs of infection (pain, swelling, redness, odor or green/yellow discharge around incision site)   Complete by: As directed    Call MD for:  redness, tenderness, or signs of infection (pain, swelling, redness, odor or green/yellow discharge around incision site)   Complete by: As directed    Call MD for:  severe uncontrolled pain   Complete by: As directed    Call MD for:  severe uncontrolled pain   Complete by: As directed    Call MD for:  temperature >100.4   Complete by: As directed    Call MD for:  temperature >100.4   Complete by: As directed    Diet - low sodium heart healthy   Complete by: As directed    Diet - low sodium heart healthy   Complete by: As directed    Discharge instructions   Complete by: As directed    ACTIVITY AND EXERCISE  Daily activity and exercise are an important part of your recovery. People recover at different rates depending on their general health and type of valve procedure.  Most people recovering from TAVR feel better relatively quickly   No lifting, pushing, pulling more than 10 pounds (examples to avoid: groceries, vacuuming, gardening, golfing):             - For one week with a procedure through the groin.             - For six weeks for procedures through the chest wall or neck. NOTE: You will typically see one of our providers 7-14 days after your procedure to discuss [place] the above activities.      DRIVING  Do not drive until you are seen for follow up and cleared by a provider. Generally, we ask patient to not drive for 1 week after their procedure.  If you have been told by your doctor in the past that you may not drive, you must talk with him/her before you begin driving again.   DRESSING  Groin site: you may leave the clear dressing over the  site for up to one week or until it falls off.   HYGIENE  If you had a femoral (leg) procedure, you may take a shower when you return home. After the shower, pat the site dry. Do NOT use powder, oils or lotions in your groin area until the site has completely healed.  If you had a chest procedure, you may shower when you return home unless specifically instructed not to by your discharging practitioner.             - DO NOT scrub incision; pat dry with a towel.             - DO NOT apply any lotions, oils, powders to the incision.             - No tub baths / swimming for at least 2 weeks.  If you notice any fevers, chills, increased pain, swelling, bleeding or pus, please contact your doctor.  ADDITIONAL INFORMATION  If you are going to have an upcoming dental procedure, please contact our office as you will require antibiotics ahead of time to prevent infection on your heart valve.    If you have any questions or concerns you can call the structural heart phone during normal business hours 8am-4pm. If you have an urgent need after hours or weekends please call [phone] to talk to the on call provider for general cardiology. If you have an emergency that requires immediate attention, please call 911.    After TAVR Checklist  Check  Test Description  Follow up appointment in 1-2 weeks  You will see our structural heart advanced practice provider. Your incision sites will be checked and you will be cleared to drive and resume all normal activities if you are doing well.    1 month echo and follow up  You will have an echo to check on your new heart valve and be seen back in the office by a structural heart advanced practice provider.  Follow up with your primary cardiologist You will need to be seen by your primary cardiologist in the following 3-6 months after your 1 month appointment in the valve clinic.   1 year echo and follow up You will have another echo to check on your heart  valve after 1 year and be seen back in the office by a structural heart advanced practice provider. This your last structural heart visit.  Bacterial endocarditis prophylaxis  You will have to take antibiotics for the rest of your life before all dental procedures (even teeth cleanings) to protect your heart valve. Antibiotics are also required before some surgeries. Please check with your cardiologist before scheduling any surgeries. Also, please make sure to tell us if you have a penicillin allergy as you will require an alternative antibiotic.   Discharge instructions   Complete by: As directed    ACTIVITY AND EXERCISE  Daily activity and exercise are an important part of your recovery. People recover at different rates depending on their general health and type of valve procedure.  Most people recovering from TAVR feel better relatively quickly   No lifting, pushing, pulling more than 10 pounds (examples to avoid: groceries, vacuuming, gardening, golfing):             - For one week with a procedure through the groin.             - For six weeks for procedures through the chest wall or neck. NOTE: You will typically see one of our providers 7-14 days after your procedure to discuss [place] the above activities.      DRIVING  Do not drive until you are seen for follow up and cleared by a provider. Generally, we ask patient to not drive for 1 week after their procedure.  If you have been told by your doctor in the past that you may not drive, you must talk with him/her before you begin driving again.   DRESSING  Groin site: you may leave the clear dressing over the site for up to one week or until it falls off.   HYGIENE  If you had a femoral (leg) procedure, you may take a shower when you return home. After the shower, pat the site dry. Do NOT use powder, oils or lotions in your groin area until the site has completely healed.  If you had a chest procedure, you may shower when  you return home unless  specifically instructed not to by your discharging practitioner.             - DO NOT scrub incision; pat dry with a towel.             - DO NOT apply any lotions, oils, powders to the incision.             - No tub baths / swimming for at least 2 weeks.  If you notice any fevers, chills, increased pain, swelling, bleeding or pus, please contact your doctor.   ADDITIONAL INFORMATION  If you are going to have an upcoming dental procedure, please contact our office as you will require antibiotics ahead of time to prevent infection on your heart valve.    If you have any questions or concerns you can call the structural heart phone during normal business hours 8am-4pm. If you have an urgent need after hours or weekends please call [phone] to talk to the on call provider for general cardiology. If you have an emergency that requires immediate attention, please call 911.    After TAVR Checklist  Check  Test Description  Follow up appointment in 1-2 weeks  You will see our structural heart advanced practice provider. Your incision sites will be checked and you will be cleared to drive and resume all normal activities if you are doing well.    1 month echo and follow up  You will have an echo to check on your new heart valve and be seen back in the office by a structural heart advanced practice provider.  Follow up with your primary cardiologist You will need to be seen by your primary cardiologist in the following 3-6 months after your 1 month appointment in the valve clinic.   1 year echo and follow up You will have another echo to check on your heart valve after 1 year and be seen back in the office by a structural heart advanced practice provider. This your last structural heart visit.  Bacterial endocarditis prophylaxis  You will have to take antibiotics for the rest of your life before all dental procedures (even teeth cleanings) to protect your heart valve.  Antibiotics are also required before some surgeries. Please check with your cardiologist before scheduling any surgeries. Also, please make sure to tell us if you have a penicillin allergy as you will require an alternative antibiotic.   Increase activity slowly   Complete by: As directed    Increase activity slowly   Complete by: As directed       Discharge Medications   Allergies as of 01/29/2023       Reactions   Valium Nausea And Vomiting        Medication List     STOP taking these medications    benazepril 40 MG tablet Commonly known as: LOTENSIN       TAKE these medications    apixaban 2.5 MG Tabs tablet Commonly known as: Eliquis Take 1 tablet (2.5 mg total) by mouth 2 (two) times daily. (NEEDS TO BE SEEN BEFORE NEXT REFILL) Notes to patient: Resume for evening dose 2/8   atorvastatin 20 MG tablet Commonly known as: LIPITOR TAKE ONE (1) TABLET EACH DAY What changed: See the new instructions.   carvedilol 25 MG tablet Commonly known as: COREG TAKE ONE TABLET BY MOUTH TWICE DAILY   diphenhydramine-acetaminophen 25-500 MG Tabs tablet Commonly known as: TYLENOL PM Take 2 tablets by mouth at bedtime.   furosemide 40 MG tablet  Commonly known as: LASIX Take 1 tablet (40 mg total) by mouth daily as needed for fluid.   hydrALAZINE 100 MG tablet Commonly known as: APRESOLINE TAKE ONE (1) TABLET BY MOUTH TWO (2) TIMES DAILY   hydrocortisone 2.5 % rectal cream Commonly known as: ANUSOL-HC Place 1 Application rectally 2 (two) times daily.   loperamide 2 MG tablet Commonly known as: IMODIUM A-D Take 2 mg by mouth 4 (four) times daily as needed for diarrhea or loose stools.   Nitrostat 0.4 MG SL tablet Generic drug: nitroGLYCERIN DISSOLVE 1 TAB UNDER TOUNGE FOR CHEST PAIN. MAY REPEAT EVERY 5 MINUTES FOR 3 DOSES. IF NO RELIEF CALL 911 OR GO TO ER   pantoprazole 40 MG tablet Commonly known as: PROTONIX TAKE 1 TABLET BY MOUTH TWICE DAILY BEFORE MEALS    sertraline 25 MG tablet Commonly known as: ZOLOFT TAKE ONE (1) TABLET BY MOUTH EVERY DAY   traMADol 50 MG tablet Commonly known as: ULTRAM TAKE 1 TABLET BY MOUTH EVERY 6 HOURS AS NEEDED FOR PAIN       Outstanding Labs/Studies   BMET  Duration of Discharge Encounter   Greater than 30 minutes including physician time.  SignedKathyrn Drown, NP 01/29/2023, 10:19 AM [phone]

## 2023-01-27 NOTE — Interval H&P Note (Signed)
History and Physical Interval Note:  01/27/2023 9:24 AM  [name]  has presented today for surgery, with the diagnosis of Severe Aortic Stenosis.  The various methods of treatment have been discussed with the patient and family. After consideration of risks, benefits and other options for treatment, the patient has consented to  Procedure(s): Transcatheter Aortic Valve Replacement-Subclavian (Left) TRANSESOPHAGEAL ECHOCARDIOGRAM (N/A) as a surgical intervention.  The patient's history has been reviewed, patient examined, no change in status, stable for surgery.  I have reviewed the patient's chart and labs.  Questions were answered to the patient's satisfaction.     Gaye Pollack

## 2023-01-27 NOTE — Anesthesia Procedure Notes (Signed)
Procedure Name: Intubation Date/Time: 01/27/2023 11:35 AM  Performed by: Inda Coke, CRNAPre-anesthesia Checklist: Patient identified, Emergency Drugs available, Suction available, Timeout performed and Patient being monitored Patient Re-evaluated:Patient Re-evaluated prior to induction Oxygen Delivery Method: Circle system utilized Preoxygenation: Pre-oxygenation with 100% oxygen Induction Type: IV induction Ventilation: Mask ventilation without difficulty Laryngoscope Size: Mac and 3 Grade View: Grade I Tube type: Oral Tube size: 7.5 mm Number of attempts: 1 Airway Equipment and Method: Stylet Placement Confirmation: ETT inserted through vocal cords under direct vision, positive ETCO2, CO2 detector and breath sounds checked- equal and bilateral Secured at: 22 cm Tube secured with: Tape Dental Injury: Teeth and Oropharynx as per pre-operative assessment

## 2023-01-27 NOTE — Progress Notes (Signed)
  Echocardiogram Echocardiogram Transesophageal has been performed.  Darlina Sicilian M 01/27/2023, 12:47 PM

## 2023-01-27 NOTE — Anesthesia Preprocedure Evaluation (Addendum)
Anesthesia Evaluation  Patient identified by MRN, date of birth, ID band Patient awake    Reviewed: Allergy & Precautions, H&P , NPO status , Patient's Chart, lab work & pertinent test results  Airway Mallampati: II  TM Distance: >3 FB Neck ROM: Full    Dental no notable dental hx.    Pulmonary former smoker   Pulmonary exam normal breath sounds clear to auscultation       Cardiovascular hypertension, + CAD, + Past MI and + Cardiac Stents  + dysrhythmias Atrial Fibrillation + Valvular Problems/Murmurs AS  Rhythm:Irregular Rate:Normal + Systolic murmurs    Neuro/Psych negative neurological ROS  negative psych ROS   GI/Hepatic negative GI ROS, Neg liver ROS,,,  Endo/Other  diabetes    Renal/GU negative Renal ROS  negative genitourinary   Musculoskeletal negative musculoskeletal ROS (+)    Abdominal   Peds negative pediatric ROS (+)  Hematology negative hematology ROS (+)   Anesthesia Other Findings   Reproductive/Obstetrics negative OB ROS                             Anesthesia Physical Anesthesia Plan  ASA: 3  Anesthesia Plan: General   Post-op Pain Management: Minimal or no pain anticipated   Induction: Intravenous  PONV Risk Score and Plan: 2 and Propofol infusion, Treatment may vary due to age or medical condition and TIVA  Airway Management Planned: Oral ETT  Additional Equipment:   Intra-op Plan:   Post-operative Plan: Extubation in OR  Informed Consent: I have reviewed the patients History and Physical, chart, labs and discussed the procedure including the risks, benefits and alternatives for the proposed anesthesia with the patient or authorized representative who has indicated his/her understanding and acceptance.     Dental advisory given  Plan Discussed with: CRNA and Surgeon  Anesthesia Plan Comments:        Anesthesia Quick Evaluation

## 2023-01-27 NOTE — Anesthesia Postprocedure Evaluation (Signed)
Anesthesia Post Note  Patient: EURIKA SANDY  Procedure(s) Performed: Transcatheter Aortic Valve Replacement-Subclavian (Left: Chest) TRANSESOPHAGEAL ECHOCARDIOGRAM     Patient location during evaluation: PACU Anesthesia Type: General Level of consciousness: sedated Pain management: pain level controlled Vital Signs Assessment: post-procedure vital signs reviewed and stable Respiratory status: spontaneous breathing and respiratory function stable Cardiovascular status: stable Postop Assessment: no apparent nausea or vomiting Anesthetic complications: no   No notable events documented.  Last Vitals:  Vitals:   01/27/23 1410 01/27/23 1519  BP: (!) 180/92 (!) 188/97  Pulse: 84 90  Resp: 13 12  Temp:  36.5 C  SpO2: 95% 98%    Last Pain:  Vitals:   01/27/23 1519  TempSrc: Oral  PainSc:                  Sajad Glander DANIEL

## 2023-01-27 NOTE — Transfer of Care (Signed)
Immediate Anesthesia Transfer of Care Note  Patient: [name]  Procedure(s) Performed: Transcatheter Aortic Valve Replacement-Subclavian (Left: Chest) TRANSESOPHAGEAL ECHOCARDIOGRAM  Patient Location: PACU  Anesthesia Type:General  Level of Consciousness: awake and alert   Airway & Oxygen Therapy: Patient Spontanous Breathing and Patient connected to nasal cannula oxygen  Post-op Assessment: Report given to RN and Post -op Vital signs reviewed and stable  Post vital signs: Reviewed and stable  Last Vitals:  Vitals Value Taken Time  BP    Temp    Pulse    Resp    SpO2      Last Pain:  Vitals:   01/27/23 0828  TempSrc:   PainSc: 0-No pain      Patients Stated Pain Goal: 0 (93/71/69 6789)  Complications: No notable events documented.

## 2023-01-27 NOTE — Progress Notes (Signed)
  [place] VALVE TEAM   Patient A-line removed in the cath lab procedure room by perfusionist. Post TAVR labs drawn at that time however unable to see results. Cath lab holding staff noted K+ to be 2.6. Will obtain stat BMET to confirm and treat accordingly.   Kathyrn Drown NP-C Structural Heart Team  Pager: [phone] Phone: [phone]

## 2023-01-27 NOTE — Progress Notes (Signed)
Pt arrived from ...cath.., A/ox 4...pt denies any pain, MD aware,CCMD called. CHG bath given,no further needs at this time   

## 2023-01-27 NOTE — Progress Notes (Signed)
Mobility Specialist Progress Note   01/27/23 1757  Mobility  Activity Ambulated with assistance in hallway  Level of Assistance +2 (takes two people) Education officer, museum)  Games developer wheel walker  Distance Ambulated (ft) 200 ft  Activity Response Tolerated well  Mobility Referral Yes  $Mobility charge 1 Mobility   Pre Mobility: 84 HR, 152/73 BP, 92% SpO2 on RA  During Mobility: 120 HR, 142/71 BP, 96% SpO2 on RA Post Mobility: 112 HR, 171/ 93 BP, 94% SpO2 on RA   Received pt in bed having L groin discomfort but incision site stable. MinA to EOB accompanied w/ dizziness, symptoms quickly subsided. During hallway ambulation, Pt expressing UE fatiguing and starts veering to the right, stopped pt for a seated rest break. Pushed back half the distance to the room and pt ambulating the rest, no faults back to bed. BP elevated but pt having no complaints, groin site stable. Call bell placed in reach, RN notified and bed alarm on.     [name] Mobility Specialist Please contact via SecureChat or  Rehab office at [phone]

## 2023-01-27 NOTE — Op Note (Signed)
[facility]   MULTIDISCIPLINARY HEART VALVE TEAM   TAVR OPERATIVE NOTE   Date of Procedure:  01/27/2023  Preoperative Diagnosis: Severe Aortic Stenosis   Postoperative Diagnosis: Same   Procedure:   Transcatheter Aortic Valve Replacement -  left subclavian artery approach  Edwards Sapien 3 Ultra Resilia THV (size 23 mm, model # 9755RSL, serial # [id number])   Co-Surgeons:  Gaye Pollack, MD and Lauree Chandler, MD   Anesthesiologist:  Tamela Gammon, MD  Echocardiographer:  Sanda Klein, MD  Pre-operative Echo Findings: Severe aortic stenosis Normal left ventricular systolic function  Post-operative Echo Findings: No paravalvular leak Normal left ventricular systolic function   BRIEF CLINICAL NOTE AND INDICATIONS FOR SURGERY  78F with severe symptomatic aortic stenosis.   Meets criteria for AV replacement.   STS risk is 5.7%.    Risks/benefits/alternatives of TAVR were discussed at length (90% standard recovery, 9% morbidity [any organ, typically access site vascular complication needing surgery, stroke, or pacemaker] and <1% mortality.       DETAILS OF THE OPERATIVE PROCEDURE  PREPARATION:    The patient was brought to the operating room on the above mentioned date and appropriate monitoring was established by the anesthesia team. The patient was placed in the supine position on the operating table.  Intravenous antibiotics were administered. General endotracheal anesthesia was induced uneventfully.  Baseline transesophageal echocardiogram was performed. The patient's chest, abdomen and both groins were prepped and draped in a sterile manner. A time out procedure was performed.   PERIPHERAL ACCESS:    Using the modified Seldinger technique, femoral arterial and venous access was obtained with placement of 6 Fr (arterial) and 7 Fr (venous) sheaths on the right side.  A pigtail diagnostic catheter was passed through the right arterial sheath  under fluoroscopic guidance into the aortic root.  A temporary transvenous pacemaker catheter was passed through the right femoral venous sheath under fluoroscopic guidance into the right ventricle.  The pacemaker was tested to ensure stable lead placement and pacemaker capture. Aortic root angiography was performed in order to determine the optimal angiographic angle for valve deployment.   LEFT SUBCLAVIAN ACCESS:   A transverse incision was made below the left clavicle and carried down through the subcutaneous tissue using electrocautery. The pectoralis major muscle was split along its fibers and the pectoralis minor muscle retracted laterally. The left axillary artery was identified and encircled with a vessel loop. The patient was heparinized systemically and ACT verified > 250 seconds.  A double concentric purse string suture of CV-4 gortex was placed in the anterior wall of the artery. The artery was cannulated with a needle and a J- wire advanced into the ascending aorta. An 8 F sheath was inserted over the wire. The aortic valve was crossed with a JR4 catheter and a straight wire. This was exchanged for a pigtail catheter and position was confirmed in the LV apex. Simultaneous LV and Ao pressures were recorded.  The pigtail catheter was exchanged for a Safari wire in the LV apex.  Then a 62F E-sheath was inserted into the axillary artery and the tip advanced into the aortic arch.  BALLOON AORTIC VALVULOPLASTY:   Not performed.  TRANSCATHETER HEART VALVE DEPLOYMENT:   An Edwards Sapien 3 Ultra transcatheter heart valve (size 23 mm) was prepared and crimped per manufacturer's guidelines, and the proper orientation of the valve is confirmed on the [employer] delivery system. The valve was advanced through the introducer sheath using normal technique until  in an appropriate position in the ascending aorta beyond the sheath tip. The balloon was then retracted and using the fine-tuning wheel  was centered on the valve. The valve was then advanced across the aortic arch using appropriate flexion of the catheter. The valve was carefully positioned across the aortic valve annulus. The Commander catheter was retracted using normal technique. Once final position of the valve has been confirmed by angiographic assessment, the valve is deployed while temporarily holding ventilation and during rapid ventricular pacing to maintain systolic blood pressure < 50 mmHg and pulse pressure < 10 mmHg. The balloon inflation is held for >3 seconds after reaching full deployment volume. Once the balloon has fully deflated the balloon is retracted into the ascending aorta and valve function is assessed using echocardiography. There is felt to be no paravalvular leak and no central aortic insufficiency. Post-procedural gradients were acceptable. The patient's hemodynamic recovery following valve deployment is good.  The deployment balloon and guidewire are both removed.    PROCEDURE COMPLETION:   The sheath was removed and axillary artery closure performed.  Protamine was administered once arterial repair was complete. The temporary pacemaker, pigtail catheter and femoral sheaths were removed with manual pressure used for venous hemostasis.  A Mynx femoral closure device was utilized following removal of the diagnostic sheath in the right femoral artery.  The patient tolerated the procedure well and is transported to the cath lab recovery area in stable condition. There were no immediate intraoperative complications. All sponge instrument and needle counts are verified correct at completion of the operation.   No blood products were administered during the operation.  The patient received a total of 80 mL of intravenous contrast during the procedure.   Gaye Pollack, MD 01/27/2023

## 2023-01-27 NOTE — CV Procedure (Addendum)
[facility]  TAVR OPERATIVE NOTE   Date of Procedure:  01/27/2023  Preoperative Diagnosis: Severe Aortic Stenosis   Postoperative Diagnosis: Same   Procedure:   Transcatheter Aortic Valve Replacement - TransSubclavian Approach  Edwards Sapien 3 THV (size 23 mm, model # [id number], serial # [id number] )   Co-Surgeons:  Lauree Chandler, MD and Gilford Raid , MD   Anesthesiologist:  Tobias Alexander  Echocardiographer:  Croitoru  Pre-operative Echo Findings: Severe aortic stenosis Normal left ventricular systolic function  Post-operative Echo Findings: No paravalvular leak Normal left ventricular systolic function  BRIEF CLINICAL NOTE AND INDICATIONS FOR SURGERY  87 yo female with history of persistent atrial fibrillation, CAD with prior PCI, chronic diastolic CHF, HTN, HLD, DM diet controlled and severe aortic stenosis who is here today for TAVR. She has CAD and had a bare metal stent placed in the RCA in 1999 and another distal bare metal stent placed in the distal RCA in 2012. Last cardiac cath in 2012 with severe in stent restenosis in distal RCA stent treated with cutting balloon angioplasty. She has been followed over the past year for severe aortic stenosis. Echo December 2022 with normal LV function, mild MR and severe low flow/low gradient AS with mean gradient 27 mmHg, AVA 0.8 cm2. She was called at that time and referral to the structural heart clinic was made but she had other family issues and did not schedule this appt. Most recent echo 06/20/22 with LVEF=65-70%, mild MR. Severe low flow/low gradient AS with mean gradient 27 mmHg, AVA 0.72 cm2, DI 0.23, SVI 34.  During the course of the patient's preoperative work up they have been evaluated comprehensively by a multidisciplinary team of specialists coordinated through the [facility] in the [facility].  They have been demonstrated to suffer from symptomatic severe  aortic stenosis as noted above. The patient has been counseled extensively as to the relative risks and benefits of all options for the treatment of severe aortic stenosis including long term medical therapy, conventional surgery for aortic valve replacement, and transcatheter aortic valve replacement.  The patient has been independently evaluated by Dr. Tenny Craw with CT surgery and they are felt to be at high risk for conventional surgical aortic valve replacement. The surgeon indicated the patient would be a poor candidate for conventional surgery. Based upon review of all of the patient's preoperative diagnostic tests they are felt to be candidate for transcatheter aortic valve replacement using the transfemoral approach as an alternative to high risk conventional surgery.    Following the decision to proceed with transcatheter aortic valve replacement, a discussion has been held regarding what types of management strategies would be attempted intraoperatively in the event of life-threatening complications, including whether or not the patient would be considered a candidate for the use of cardiopulmonary bypass and/or conversion to open sternotomy for attempted surgical intervention.  The patient has been advised of a variety of complications that might develop peculiar to this approach including but not limited to risks of death, stroke, paravalvular leak, aortic dissection or other major vascular complications, aortic annulus rupture, device embolization, cardiac rupture or perforation, acute myocardial infarction, arrhythmia, heart block or bradycardia requiring permanent pacemaker placement, congestive heart failure, respiratory failure, renal failure, pneumonia, infection, other late complications related to structural valve deterioration or migration, or other complications that might ultimately cause a temporary or permanent loss of functional independence or other long term morbidity.  The patient  provides full  informed consent for the procedure as described and all questions were answered preoperatively.  DETAILS OF THE OPERATIVE PROCEDURE  PREPARATION:   The patient is brought to the operating room on the above mentioned date and central monitoring was established by the anesthesia team including placement of a radial arterial line. The patient is placed in the supine position on the operating table.  Intravenous antibiotics are administered. General anesthesia is used  Baseline transesophageal echocardiogram was performed. The patient's chest, abdomen, both groins, and both lower extremities are prepared and draped in a sterile manner. A time out procedure is performed.   PERIPHERAL ACCESS:   Using the modified Seldinger technique, femoral arterial and venous access were obtained with placement of a 6 Fr sheath in the artery and a 7 Fr sheath in the vein on the right side using u/s guidance.  A pigtail diagnostic catheter was passed through the femoral arterial sheath under fluoroscopic guidance into the aortic root.  A temporary transvenous pacemaker catheter was passed through the femoral venous sheath under fluoroscopic guidance into the right ventricle.  The pacemaker was tested to ensure stable lead placement and pacemaker capture. Aortic root angiography was performed in order to determine the optimal angiographic angle for valve deployment.  TRANS SUBCLAVIAN ARTERY ACCESS:  Please see Dr. Vivi Martens operative note for details of the surgical access.   The left subclavian artery was exposed. A needle was used to engage the artery. J wire advanced down into the ascending aorta. A 6 French sheath was placed. I then used an AL-1 catheter to direct the J wire across the aortic valve. The patient was heparinized systemically and ACT verified > 250 seconds.  A pigtail catheter was used to exchange this wire for a Safari wire. I then advanced the 80 Fr E-sheath into the left subclavian artery.     TRANSCATHETER HEART VALVE DEPLOYMENT:  An Edwards Sapien 3 THV (size 23 mm) was prepared and crimped per manufacturer's guidelines, and the proper orientation of the valve is confirmed on the [employer] delivery system. The valve was advanced through the introducer sheath using normal technique until in an appropriate position in the ascending aorta beyond the sheath tip. The balloon was then retracted and using the fine-tuning wheel was centered on the valve. The valve was carefully positioned across the aortic valve annulus. The Commander catheter was retracted using normal technique. Once final position of the valve has been confirmed by angiographic assessment, the valve is deployed while temporarily holding ventilation and during rapid ventricular pacing to maintain systolic blood pressure < 50 mmHg and pulse pressure < 10 mmHg. The balloon inflation is held for >3 seconds after reaching full deployment volume. Once the balloon has fully deflated the balloon is retracted into the ascending aorta and valve function is assessed using TEE. There is felt to be no paravalvular leak and no central aortic insufficiency.  The patient's hemodynamic recovery following valve deployment is good.  The deployment balloon and guidewire are both removed. Echo demostrated acceptable post-procedural gradients, stable mitral valve function, and no AI.   PROCEDURE COMPLETION:  The sheath was then removed. Please see Dr. Vivi Martens note for surgical details.  Protamine was administered once subclavian arterial repair was complete. The temporary pacemaker, pigtail catheters and femoral sheaths were removed with a Mynx closure device placed in the artery and manual pressure used for venous hemostasis.    The patient tolerated the procedure well and is transported to the surgical intensive care in stable  condition. There were no immediate intraoperative complications. All sponge instrument and needle counts are  verified correct at completion of the operation.   No blood products were administered during the operation.  The patient received a total of 80 mL of intravenous contrast during the procedure.  LVEDP: 20 mmHg  Lauree Chandler MD, [facility] 01/27/2023 1:25 PM

## 2023-01-28 ENCOUNTER — Inpatient Hospital Stay (HOSPITAL_COMMUNITY): Payer: Medicare Other

## 2023-01-28 DIAGNOSIS — Z952 Presence of prosthetic heart valve: Secondary | ICD-10-CM

## 2023-01-28 DIAGNOSIS — I350 Nonrheumatic aortic (valve) stenosis: Secondary | ICD-10-CM | POA: Diagnosis not present

## 2023-01-28 DIAGNOSIS — Z006 Encounter for examination for normal comparison and control in clinical research program: Secondary | ICD-10-CM | POA: Diagnosis not present

## 2023-01-28 DIAGNOSIS — Z954 Presence of other heart-valve replacement: Secondary | ICD-10-CM

## 2023-01-28 DIAGNOSIS — E1169 Type 2 diabetes mellitus with other specified complication: Secondary | ICD-10-CM | POA: Diagnosis not present

## 2023-01-28 DIAGNOSIS — I4819 Other persistent atrial fibrillation: Secondary | ICD-10-CM | POA: Diagnosis not present

## 2023-01-28 LAB — CBC
HCT: 19.5 % — ABNORMAL LOW (ref 36.0–46.0)
HCT: 23.6 % — ABNORMAL LOW (ref 36.0–46.0)
Hemoglobin: 6.3 g/dL — CL (ref 12.0–15.0)
Hemoglobin: 7.8 g/dL — ABNORMAL LOW (ref 12.0–15.0)
MCH: 28 pg (ref 26.0–34.0)
MCH: 28.9 pg (ref 26.0–34.0)
MCHC: 32.3 g/dL (ref 30.0–36.0)
MCHC: 33.1 g/dL (ref 30.0–36.0)
MCV: 86.7 fL (ref 80.0–100.0)
MCV: 87.4 fL (ref 80.0–100.0)
Platelets: 163 10*3/uL (ref 150–400)
Platelets: 172 10*3/uL (ref 150–400)
RBC: 2.25 MIL/uL — ABNORMAL LOW (ref 3.87–5.11)
RBC: 2.7 MIL/uL — ABNORMAL LOW (ref 3.87–5.11)
RDW: 15.7 % — ABNORMAL HIGH (ref 11.5–15.5)
RDW: 15.3 % (ref 11.5–15.5)
WBC: 5.9 10*3/uL (ref 4.0–10.5)
WBC: 7.2 10*3/uL (ref 4.0–10.5)
nRBC: 0 % (ref 0.0–0.2)
nRBC: 0 % (ref 0.0–0.2)

## 2023-01-28 LAB — ECHOCARDIOGRAM COMPLETE
AR max vel: 2.84 cm2
AV Area VTI: 2.92 cm2
AV Area mean vel: 2.84 cm2
AV Mean grad: 6 mmHg
AV Peak grad: 11.4 mmHg
Ao pk vel: 1.69 m/s
Area-P 1/2: 3.72 cm2
Height: 65.5 in
S' Lateral: 2.1 cm
Weight: 2257.51 oz

## 2023-01-28 LAB — BASIC METABOLIC PANEL
Anion gap: 10 (ref 5–15)
BUN: 17 mg/dL (ref 8–23)
CO2: 24 mmol/L (ref 22–32)
Calcium: 8 mg/dL — ABNORMAL LOW (ref 8.9–10.3)
Chloride: 102 mmol/L (ref 98–111)
Creatinine, Ser: 0.88 mg/dL (ref 0.44–1.00)
GFR, Estimated: >60 mL/min (ref >60)
Glucose, Bld: 125 mg/dL — ABNORMAL HIGH (ref 70–99)
Potassium: 2.9 mmol/L — ABNORMAL LOW (ref 3.5–5.1)
Sodium: 136 mmol/L (ref 135–145)

## 2023-01-28 LAB — PREPARE RBC (CROSSMATCH)

## 2023-01-28 LAB — MAGNESIUM: Magnesium: 1.3 mg/dL — ABNORMAL LOW (ref 1.7–2.4)

## 2023-01-28 MED ORDER — SODIUM CHLORIDE 0.9% IV SOLUTION: Freq: Once | INTRAVENOUS | Status: AC

## 2023-01-28 MED ORDER — MAGNESIUM SULFATE 2 GM/50ML IV SOLN
2.0000 g | Freq: Once | INTRAVENOUS | Status: AC
  Administered 2023-01-28: 2 g via INTRAVENOUS
  Filled 2023-01-28: qty 50

## 2023-01-28 MED ORDER — FUROSEMIDE 10 MG/ML IJ SOLN
40.0000 mg | Freq: Once | INTRAMUSCULAR | Status: AC
  Administered 2023-01-28: 40 mg via INTRAVENOUS
  Filled 2023-01-28: qty 4

## 2023-01-28 MED ORDER — POTASSIUM CHLORIDE CRYS ER 20 MEQ PO TBCR
40.0000 meq | EXTENDED_RELEASE_TABLET | Freq: Once | ORAL | Status: AC
  Administered 2023-01-28: 40 meq via ORAL
  Filled 2023-01-28: qty 2

## 2023-01-28 MED ORDER — CHLORHEXIDINE GLUCONATE CLOTH 2 % EX PADS
6.0000 | MEDICATED_PAD | Freq: Every day | CUTANEOUS | Status: DC
  Administered 2023-01-28 – 2023-01-29 (×2): 6 via TOPICAL

## 2023-01-28 MED ORDER — FE FUM-VIT C-VIT B12-FA 460-60-0.01-1 MG PO CAPS
1.0000 | ORAL_CAPSULE | Freq: Every day | ORAL | Status: DC
  Administered 2023-01-28 – 2023-01-29 (×2): 1 via ORAL
  Filled 2023-01-28 (×2): qty 1

## 2023-01-28 MED ORDER — TRAMADOL HCL 50 MG PO TABS: 50.0000 mg | ORAL_TABLET | ORAL | Status: DC | PRN

## 2023-01-28 NOTE — Progress Notes (Signed)
1 Day Post-Op Procedure(s) (LRB): Transcatheter Aortic Valve Replacement-Subclavian (Left) TRANSESOPHAGEAL ECHOCARDIOGRAM (N/A) Subjective: Tired from a busy night of checking vital signs, lab draw etc. She feels fine otherwise and walked last night. Daughter was with her overnight.  Hgb this am was 6.3 and being transfused 1 unit prbc's. Her Hgb was 9.8 on 2/2 preop but not checked postop. She did not have any significant blood loss but did get IVF. No bleeding from right groin site or left subclavian incision.   Objective: Vital signs in last 24 hours: Temp:  [97.1 F (36.2 C)-98.5 F (36.9 C)] 98.5 F (36.9 C) (02/07 0540) Pulse Rate:  [79-107] 83 (02/07 0540) Cardiac Rhythm: Atrial fibrillation (02/07 0405) Resp:  [10-21] 20 (02/07 0645) BP: (92-203)/(43-105) 106/53 (02/07 0540) SpO2:  [89 %-99 %] 97 % (02/07 0540) Weight:  [64 kg] 64 kg (02/06 0811)  Hemodynamic parameters for last 24 hours:    Intake/Output from previous day: 02/06 0701 - 02/07 0700 In: 9326 [P.O.:300; I.V.:850] Out: 1020 [Urine:1000; Blood:20] Intake/Output this shift: No intake/output data recorded.  General appearance: alert and cooperative Neurologic: intact Heart: irregularly irregular rhythm, no murmur Lungs: clear to auscultation bilaterally Abdomen: soft, non-tender; bowel sounds normal Extremities: no edema Wound: left subclavian incision and right groin look fine with no hematoma.  Lab Results: Recent Labs    01/28/23 0308  WBC 5.9  HGB 6.3*  HCT 19.5*  PLT 163   BMET:  Recent Labs    01/27/23 1616 01/28/23 0203  NA 138 136  K 3.0* 2.9*  CL 100 102  CO2 29 24  GLUCOSE 160* 125*  BUN 10 17  CREATININE 0.72 0.88  CALCIUM 8.5* 8.0*    PT/INR: No results for input(s): "LABPROT", "INR" in the last 72 hours. ABG    Component Value Date/Time   PHART 7.405 11/24/2022 0801   HCO3 25.3 11/24/2022 0807   TCO2 27 11/24/2022 0807   ACIDBASEDEF 1.0 11/24/2022 0806   O2SAT 65  11/24/2022 0807   CBG (last 3)  Recent Labs    01/27/23 0806  GLUCAP 123*    Assessment/Plan: S/P Procedure(s) (LRB): Transcatheter Aortic Valve Replacement-Subclavian (Left) TRANSESOPHAGEAL ECHOCARDIOGRAM (N/A)  POD 1 Hemodynamically stable in rate controlled chronic atrial fib. Hold Eliquis this am until we are sure Hgb stable after transfusion.   Preop anemia at Hgb 9.8 with drop to 6.3 this am likely due to dilution and expected minimal blood loss from surgery. No visible bleeding from operative sites and abdomen benign.Transfusing 1 unit prbc's and will repeat Hgb. Vitals have been stable. Add some iron.  2D echo this am.     LOS: 1 day    Gaye Pollack 01/28/2023

## 2023-01-28 NOTE — Progress Notes (Signed)
CARDIAC REHAB PHASE I   PRE:  Rate/Rhythm:91 a-fib  BP:  Sitting: 118/60      SaO2: 94 RA  MODE:  Ambulation: 0 ft   POST:  Rate/Rhythm: 90 a-fib  BP:  Sitting: 141/67      SaO2: 93 RA   Pt feeling tired and sleepy. Agreeable to ambulation. Moderate assist to sitting on side of bed. Very weak sitting needing assist to sit upright. After sitting for a few minutes, able to stand at bedside and take few steps. Pt felt weak and dizzy. Assisted back to bed with call bell and bedside table in reach. Post TAVR education including site care, heart healthy diet, risk factors, restrictions and CRP2 reviewed with pt and daughter. All questions and concerns addressed. Will refer to AP for CRP2. Will continue to follow.     [id number] Vanessa Barbara, RN BSN 01/28/2023 10:37 AM

## 2023-01-28 NOTE — Progress Notes (Signed)
Mobility Specialist Progress Note:   01/28/23 1523  Mobility  Activity Ambulated with assistance in room  Level of Assistance Contact guard assist, steadying assist  Assistive Device Front wheel walker  Distance Ambulated (ft) 2 ft  Activity Response Tolerated fair  Mobility Referral Yes  $Mobility charge 1 Mobility   Pt received in bed and agreeable. C/o fatigue throughout. Pt returned to supine with all needs met, call bell in reach, and NT in room.   Andrey Campanile Mobility Specialist Please contact via SecureChat or  Rehab office at [phone]

## 2023-01-28 NOTE — Progress Notes (Signed)
  Echocardiogram 2D Echocardiogram has been performed.  [name] 01/28/2023, 9:42 AM

## 2023-01-29 DIAGNOSIS — Z954 Presence of other heart-valve replacement: Secondary | ICD-10-CM

## 2023-01-29 DIAGNOSIS — I350 Nonrheumatic aortic (valve) stenosis: Secondary | ICD-10-CM

## 2023-01-29 LAB — TYPE AND SCREEN
ABO/RH(D): O POS
Antibody Screen: NEGATIVE
Unit division: 0
Unit division: 0

## 2023-01-29 LAB — CBC
HCT: 26.2 % — ABNORMAL LOW (ref 36.0–46.0)
Hemoglobin: 8.7 g/dL — ABNORMAL LOW (ref 12.0–15.0)
MCH: 29.4 pg (ref 26.0–34.0)
MCHC: 33.2 g/dL (ref 30.0–36.0)
MCV: 88.5 fL (ref 80.0–100.0)
Platelets: 142 10*3/uL — ABNORMAL LOW (ref 150–400)
RBC: 2.96 MIL/uL — ABNORMAL LOW (ref 3.87–5.11)
RDW: 15.3 % (ref 11.5–15.5)
WBC: 7.2 10*3/uL (ref 4.0–10.5)
nRBC: 0 % (ref 0.0–0.2)

## 2023-01-29 LAB — BASIC METABOLIC PANEL
Anion gap: 10 (ref 5–15)
BUN: 16 mg/dL (ref 8–23)
CO2: 26 mmol/L (ref 22–32)
Calcium: 8.2 mg/dL — ABNORMAL LOW (ref 8.9–10.3)
Chloride: 98 mmol/L (ref 98–111)
Creatinine, Ser: 0.98 mg/dL (ref 0.44–1.00)
GFR, Estimated: 57 mL/min — ABNORMAL LOW (ref >60)
Glucose, Bld: 128 mg/dL — ABNORMAL HIGH (ref 70–99)
Potassium: 3.3 mmol/L — ABNORMAL LOW (ref 3.5–5.1)
Sodium: 134 mmol/L — ABNORMAL LOW (ref 135–145)

## 2023-01-29 LAB — BPAM RBC
Blood Product Expiration Date: 202403092359
Blood Product Expiration Date: 202403102359
ISSUE DATE / TIME: 202402070501
ISSUE DATE / TIME: 202402071344
Unit Type and Rh: 5100
Unit Type and Rh: 5100

## 2023-01-29 MED ORDER — POTASSIUM CHLORIDE CRYS ER 20 MEQ PO TBCR
40.0000 meq | EXTENDED_RELEASE_TABLET | Freq: Once | ORAL | Status: AC
  Administered 2023-01-29: 40 meq via ORAL
  Filled 2023-01-29: qty 2

## 2023-01-29 NOTE — Plan of Care (Signed)
  Problem: Education: Goal: Knowledge of General Education information will improve Description: Including pain rating scale, medication(s)/side effects and non-pharmacologic comfort measures Outcome: Adequate for Discharge   Problem: Health Behavior/Discharge Planning: Goal: Ability to manage health-related needs will improve Outcome: Adequate for Discharge   Problem: Clinical Measurements: Goal: Ability to maintain clinical measurements within normal limits will improve Outcome: Adequate for Discharge Goal: Will remain free from infection Outcome: Adequate for Discharge Goal: Diagnostic test results will improve Outcome: Adequate for Discharge Goal: Respiratory complications will improve Outcome: Adequate for Discharge Goal: Cardiovascular complication will be avoided Outcome: Adequate for Discharge   Problem: Nutrition: Goal: Adequate nutrition will be maintained Outcome: Adequate for Discharge   Problem: Activity: Goal: Risk for activity intolerance will decrease Outcome: Adequate for Discharge   Problem: Safety: Goal: Ability to remain free from injury will improve Outcome: Adequate for Discharge   Problem: Pain Managment: Goal: General experience of comfort will improve Outcome: Adequate for Discharge   Problem: Skin Integrity: Goal: Risk for impaired skin integrity will decrease Outcome: Adequate for Discharge

## 2023-01-29 NOTE — Progress Notes (Signed)
Pt is stable hemodynamically.  Atrial-fib on the monitor, HR 80s.  She is able to ambulate with standby assisted to the bathroom by her daughter who stays at bedside tonight. The incision on right groin and left subclavian are negative for bleeding or hematoma. Palpable good pulses bilaterally on PT and DP pulse. No acute distress noted tonight.We will continue to monitor.   Kennyth Lose, RN

## 2023-01-29 NOTE — Progress Notes (Signed)
2 Days Post-Op Procedure(s) (LRB): Transcatheter Aortic Valve Replacement-Subclavian (Left) TRANSESOPHAGEAL ECHOCARDIOGRAM (N/A) Subjective: Feels better today. Has ambulated to bathroom with assist. Daughter at bedside.  Objective: Vital signs in last 24 hours: Temp:  [98 F (36.7 C)-99 F (37.2 C)] 98.8 F (37.1 C) (02/08 0324) Pulse Rate:  [79-91] 84 (02/08 0324) Cardiac Rhythm: Atrial fibrillation (02/08 0324) Resp:  [12-18] 15 (02/08 0324) BP: (106-126)/(51-69) 122/58 (02/08 0324) SpO2:  [93 %-97 %] 97 % (02/08 0324) Weight:  [63 kg] 63 kg (02/08 0500)  Hemodynamic parameters for last 24 hours:    Intake/Output from previous day: 02/07 0701 - 02/08 0700 In: 1072.5 [P.O.:250; Blood:822.5] Out: -  Intake/Output this shift: Total I/O In: 250 [P.O.:250] Out: -   General appearance: alert and cooperative Neurologic: intact Heart: irregularly irregular rhythm Lungs: clear to auscultation bilaterally Abdomen: soft, non-tender; bowel sounds normal Extremities: warm, no edema Wound: incision healing well. Right groin site ok  Lab Results: Recent Labs    01/28/23 0950 01/29/23 0059  WBC 7.2 7.2  HGB 7.8* 8.7*  HCT 23.6* 26.2*  PLT 172 142*   BMET:  Recent Labs    01/28/23 0203 01/29/23 0059  NA 136 134*  K 2.9* 3.3*  CL 102 98  CO2 24 26  GLUCOSE 125* 128*  BUN 17 16  CREATININE 0.88 0.98  CALCIUM 8.0* 8.2*    PT/INR: No results for input(s): "LABPROT", "INR" in the last 72 hours. ABG    Component Value Date/Time   PHART 7.405 11/24/2022 0801   HCO3 25.3 11/24/2022 0807   TCO2 27 11/24/2022 0807   ACIDBASEDEF 1.0 11/24/2022 0806   O2SAT 65 11/24/2022 0807   CBG (last 3)  Recent Labs    01/27/23 0806  GLUCAP 123*    Assessment/Plan: S/P Procedure(s) (LRB): Transcatheter Aortic Valve Replacement-Subclavian (Left) TRANSESOPHAGEAL ECHOCARDIOGRAM (N/A)  POD 2 Hemodynamically stable in rate controlled atrial fib.  Hgb improved after  transfusion with appropriate rise in Hgb. Continue iron.  Wt is below preop. Received lasix yesterday with transfusion.  Resume Eliquis today.  Hypokalemia: replace  2D echo yesterday ok. Aortic valve prosthesis functioning as expected with low mean gradient and no leak. Severe TR as present preop.  Ambulated this am and if stable she could go home later with daughter.   LOS: 2 days    Gaye Pollack 01/29/2023

## 2023-01-29 NOTE — Progress Notes (Signed)
Patient and her daughter were given discharge instructions and both stated understanding.  Patient is leaving via wheelchair with her daughter driving.

## 2023-01-29 NOTE — TOC Transition Note (Signed)
Transition of Care (TOC) - CM/SW Discharge Note Marvetta Gibbons RN, BSN Transitions of Care Unit 4E- RN Case Manager See Treatment Team for direct phone #   Patient Details  Name: [name] MRN: [id number] Date of Birth: February 03, 1936  Transition of Care [facility]) CM/SW Contact:  Dawayne Patricia, RN Phone Number: 01/29/2023, 12:06 PM   Clinical Narrative:    Pt stable for transition home today, Pt/daughter requesting HHPT- order has been placed.   CM spoke with pt and daughter at bedside- list provided for [facility] choice Per CMS guidelines from [email or web address] website with star ratings (copy placed in shadow chart)- they have selected [facility] for [facility] needs (requesting Amy Gann-PT, [place]-RN-if needed).   Address, phone # and PCP all confirmed with pt,   Pt reports she has the needed DME at home (sh. Chair, RW, cane)- no new needs. Daughter to transport home.   Call made to [place] w/ [place] for [facility] referral- referral has been accepted.   Final next level of care: Home w Home Health Services Barriers to Discharge: No Barriers Identified   Patient Goals and CMS Choice CMS Medicare.gov Compare Post Acute Care list provided to:: Patient Choice offered to / list presented to : Patient, Adult Children  Discharge Placement                 Home w/ [facility]        Discharge Plan and Services Additional resources added to the After Visit Summary for     Discharge Planning Services: CM Consult Post Acute Care Choice: Home Health          DME Arranged: N/A DME Agency: NA       HH Arranged: PT HH Agency: [facility] Date [facility] Agency Contacted: 01/29/23 Time HH Agency Contacted: 64 Representative spoke with at [facility]: Angie  Social Determinants of Health ([place]) Interventions SDOH Screenings   Food Insecurity: No Food Insecurity (06/18/2022)  Housing: Low Risk  (06/18/2022)  Transportation Needs: No Transportation Needs (06/18/2022)  Alcohol Screen: Low Risk   (06/18/2022)  Depression (PHQ2-9): High Risk (08/20/2022)  Financial Resource Strain: Low Risk  (06/18/2022)  Physical Activity: Inactive (06/18/2022)  Social Connections: Moderately Integrated (06/18/2022)  Stress: No Stress Concern Present (06/18/2022)  Tobacco Use: Medium Risk (01/27/2023)     Readmission Risk Interventions    01/29/2023   10:27 AM  Readmission Risk Prevention Plan  Post Dischage Appt Complete  Medication Screening Complete  Transportation Screening Complete

## 2023-01-29 NOTE — Progress Notes (Signed)
CARDIAC REHAB PHASE I   PRE:  Rate/Rhythm: 91 a-fib  BP:  Sitting: 134/62      SaO2: 96 RA  MODE:  Ambulation: 50 ft   POST:  Rate/Rhythm: 93 a-fib  BP:  Sitting: 138/70      SaO2: 97 RA   Pt ambulated to bathroom and then in hall. Pt able to move out of bed and stand independently with guard assist only. Ambulated with slow steady pace. Tolerated well with no dizziness or SOB. Felt little tired after. Returned to bed with call bell and bedside table in reach. Pt and daughter ask about home health PT services for home at discharge. Will relay message to MD and case management. Will continue to follow.   [phone]  Vanessa Barbara, RN BSN 01/29/2023 9:24 AM

## 2023-01-30 ENCOUNTER — Telehealth: Payer: Self-pay

## 2023-01-30 NOTE — Telephone Encounter (Signed)
Patient contacted regarding discharge from [facility] on 01/29/2023.  Patient understands to follow up with provider Kathyrn Drown NP on 02/06/2023 at 11:00 AM at [facility] office. Patient understands discharge instructions? yes Patient understands medications and regiment? yes Patient understands to bring all medications to this visit? Yes  The pt is happy to be home but she is tired. I advised her that this should improve as she gets further out from surgery.  The pt denies any issues with her surgical site. The phone number to cardiology on call staff was provided to the patient.

## 2023-01-30 NOTE — Telephone Encounter (Signed)
Attempted TOC Call, left message for Ivin Booty to call back.

## 2023-01-30 NOTE — Patient Outreach (Signed)
  Care Coordination TOC Note Transition Care Management Follow-up Telephone Call Date of discharge and from where: 01/29/23-[place]  Dx: "s/p TAVR" How have you been since you were released from the hospital? Patient states she is "a little tired but doing okay." She did rest well last night. She ate a small meal for breakfast. Denies any pain or discomfort. She voices dressing in clean,dry and intact. She is aware of post-op instructions and limitations. Any questions or concerns? No  Items Reviewed: Did the pt receive and understand the discharge instructions provided? Yes  Medications obtained and verified? Yes  Other? Yes  Any new allergies since your discharge? No  Dietary orders reviewed? Yes-low salt/heart healthy,low carb Do you have support at home? Yes -daughter  Home Care and Equipment/Supplies: Were home health services ordered? Yes-HHPT If so, what is the name of the agency? [facility]  Has the agency set up a time to come to the patient's home? Yes-patient states they called and coming out Monday Were any new equipment or medical supplies ordered?  No What is the name of the medical supply agency? N/A Were you able to get the supplies/equipment? not applicable Do you have any questions related to the use of the equipment or supplies? No  Functional Questionnaire: (I = Independent and D = Dependent) ADLs: A  Bathing/Dressing- I  Meal Prep- A  Eating- I  Maintaining continence- I  Transferring/Ambulation- I  Managing Meds- A  Follow up appointments reviewed:  PCP Hospital f/u appt confirmed? No   Specialist Hospital f/u appt confirmed? Yes  Scheduled to see Karolee Stamps on 02/06/23 @ 11 am. Are transportation arrangements needed? No  If their condition worsens, is the pt aware to call PCP or go to the Emergency Dept.? Yes Was the patient provided with contact information for the PCP's office or ED? Yes Was to pt encouraged to call back with  questions or concerns? Yes  SDOH assessments and interventions completed:   Yes SDOH Interventions Today    Flowsheet Row Most Recent Value  SDOH Interventions   Food Insecurity Interventions Intervention Not Indicated  Transportation Interventions Intervention Not Indicated       Care Coordination Interventions:   TOC Interventions Today    Flowsheet Row Most Recent Value  TOC Interventions   TOC Interventions Discussed/Reviewed TOC Interventions Discussed, Post discharge activity limitations per provider, Post op wound/incision care       Interventions Today    Flowsheet Row Most Recent Value  Education Interventions   Education Provided Provided Verbal Education  Provided Verbal Education On Medication, When to see the doctor  Pharmacy Interventions   Pharmacy Dicussed/Reviewed Pharmacy Topics Discussed, Medications and their functions  Safety Interventions   Safety Discussed/Reviewed Safety Discussed        Encounter Outcome:  Pt. Visit Completed     Hetty Blend [place] Management Telephonic Care Management Coordinator Direct Phone: [phone] Toll Free: [phone] Fax: [phone]

## 2023-02-02 ENCOUNTER — Encounter (HOSPITAL_COMMUNITY): Payer: Self-pay | Admitting: Cardiovascular Disease

## 2023-02-03 ENCOUNTER — Telehealth: Payer: Self-pay | Admitting: Cardiology

## 2023-02-03 ENCOUNTER — Emergency Department (HOSPITAL_COMMUNITY): Payer: Medicare Other

## 2023-02-03 ENCOUNTER — Other Ambulatory Visit: Payer: Self-pay

## 2023-02-03 ENCOUNTER — Inpatient Hospital Stay (HOSPITAL_COMMUNITY)
Admission: EM | Admit: 2023-02-03 | Discharge: 2023-02-05 | DRG: 920 | Disposition: A | Discharge status: Home Health | Payer: Medicare Other | Attending: Internal Medicine | Admitting: Internal Medicine

## 2023-02-03 ENCOUNTER — Encounter (HOSPITAL_COMMUNITY): Payer: Self-pay | Admitting: Emergency Medicine

## 2023-02-03 DIAGNOSIS — E118 Type 2 diabetes mellitus with unspecified complications: Secondary | ICD-10-CM | POA: Diagnosis not present

## 2023-02-03 DIAGNOSIS — D649 Anemia, unspecified: Secondary | ICD-10-CM | POA: Diagnosis not present

## 2023-02-03 DIAGNOSIS — S300XXA Contusion of lower back and pelvis, initial encounter: Secondary | ICD-10-CM | POA: Diagnosis not present

## 2023-02-03 DIAGNOSIS — Z79899 Other long term (current) drug therapy: Secondary | ICD-10-CM

## 2023-02-03 DIAGNOSIS — I97638 Postprocedural hematoma of a circulatory system organ or structure following other circulatory system procedure: Secondary | ICD-10-CM

## 2023-02-03 DIAGNOSIS — R6889 Other general symptoms and signs: Secondary | ICD-10-CM | POA: Diagnosis not present

## 2023-02-03 DIAGNOSIS — R7989 Other specified abnormal findings of blood chemistry: Secondary | ICD-10-CM

## 2023-02-03 DIAGNOSIS — Z7901 Long term (current) use of anticoagulants: Secondary | ICD-10-CM

## 2023-02-03 DIAGNOSIS — J90 Pleural effusion, not elsewhere classified: Secondary | ICD-10-CM | POA: Diagnosis not present

## 2023-02-03 DIAGNOSIS — I2510 Atherosclerotic heart disease of native coronary artery without angina pectoris: Secondary | ICD-10-CM | POA: Diagnosis not present

## 2023-02-03 DIAGNOSIS — E1159 Type 2 diabetes mellitus with other circulatory complications: Secondary | ICD-10-CM | POA: Diagnosis present

## 2023-02-03 DIAGNOSIS — E876 Hypokalemia: Secondary | ICD-10-CM | POA: Diagnosis present

## 2023-02-03 DIAGNOSIS — R1031 Right lower quadrant pain: Secondary | ICD-10-CM | POA: Diagnosis present

## 2023-02-03 DIAGNOSIS — I350 Nonrheumatic aortic (valve) stenosis: Secondary | ICD-10-CM | POA: Diagnosis not present

## 2023-02-03 DIAGNOSIS — Z885 Allergy status to narcotic agent status: Secondary | ICD-10-CM | POA: Diagnosis not present

## 2023-02-03 DIAGNOSIS — I5032 Chronic diastolic (congestive) heart failure: Secondary | ICD-10-CM | POA: Diagnosis present

## 2023-02-03 DIAGNOSIS — R109 Unspecified abdominal pain: Secondary | ICD-10-CM | POA: Diagnosis not present

## 2023-02-03 DIAGNOSIS — E119 Type 2 diabetes mellitus without complications: Secondary | ICD-10-CM | POA: Diagnosis not present

## 2023-02-03 DIAGNOSIS — J9811 Atelectasis: Secondary | ICD-10-CM | POA: Diagnosis present

## 2023-02-03 DIAGNOSIS — E1169 Type 2 diabetes mellitus with other specified complication: Secondary | ICD-10-CM | POA: Diagnosis present

## 2023-02-03 DIAGNOSIS — S301XXA Contusion of abdominal wall, initial encounter: Secondary | ICD-10-CM | POA: Diagnosis not present

## 2023-02-03 DIAGNOSIS — Z87891 Personal history of nicotine dependence: Secondary | ICD-10-CM | POA: Diagnosis not present

## 2023-02-03 DIAGNOSIS — Z809 Family history of malignant neoplasm, unspecified: Secondary | ICD-10-CM

## 2023-02-03 DIAGNOSIS — E785 Hyperlipidemia, unspecified: Secondary | ICD-10-CM | POA: Diagnosis not present

## 2023-02-03 DIAGNOSIS — R079 Chest pain, unspecified: Secondary | ICD-10-CM | POA: Diagnosis not present

## 2023-02-03 DIAGNOSIS — Z952 Presence of prosthetic heart valve: Secondary | ICD-10-CM | POA: Diagnosis not present

## 2023-02-03 DIAGNOSIS — I110 Hypertensive heart disease with heart failure: Secondary | ICD-10-CM | POA: Diagnosis present

## 2023-02-03 DIAGNOSIS — I97621 Postprocedural hematoma of a circulatory system organ or structure following other procedure: Secondary | ICD-10-CM | POA: Diagnosis not present

## 2023-02-03 DIAGNOSIS — K683 Retroperitoneal hematoma: Secondary | ICD-10-CM | POA: Diagnosis not present

## 2023-02-03 DIAGNOSIS — Z955 Presence of coronary angioplasty implant and graft: Secondary | ICD-10-CM

## 2023-02-03 DIAGNOSIS — K219 Gastro-esophageal reflux disease without esophagitis: Secondary | ICD-10-CM | POA: Diagnosis present

## 2023-02-03 DIAGNOSIS — I4819 Other persistent atrial fibrillation: Secondary | ICD-10-CM | POA: Diagnosis not present

## 2023-02-03 DIAGNOSIS — I10 Essential (primary) hypertension: Secondary | ICD-10-CM | POA: Diagnosis not present

## 2023-02-03 DIAGNOSIS — K661 Hemoperitoneum: Secondary | ICD-10-CM | POA: Diagnosis not present

## 2023-02-03 DIAGNOSIS — Z8249 Family history of ischemic heart disease and other diseases of the circulatory system: Secondary | ICD-10-CM | POA: Diagnosis not present

## 2023-02-03 DIAGNOSIS — I252 Old myocardial infarction: Secondary | ICD-10-CM

## 2023-02-03 DIAGNOSIS — Z953 Presence of xenogenic heart valve: Secondary | ICD-10-CM

## 2023-02-03 DIAGNOSIS — N2889 Other specified disorders of kidney and ureter: Secondary | ICD-10-CM | POA: Diagnosis not present

## 2023-02-03 DIAGNOSIS — I25119 Atherosclerotic heart disease of native coronary artery with unspecified angina pectoris: Secondary | ICD-10-CM | POA: Diagnosis present

## 2023-02-03 DIAGNOSIS — Y838 Other surgical procedures as the cause of abnormal reaction of the patient, or of later complication, without mention of misadventure at the time of the procedure: Secondary | ICD-10-CM | POA: Diagnosis present

## 2023-02-03 DIAGNOSIS — D6859 Other primary thrombophilia: Secondary | ICD-10-CM | POA: Diagnosis not present

## 2023-02-03 DIAGNOSIS — Y713 Surgical instruments, materials and cardiovascular devices (including sutures) associated with adverse incidents: Secondary | ICD-10-CM | POA: Diagnosis present

## 2023-02-03 DIAGNOSIS — I152 Hypertension secondary to endocrine disorders: Secondary | ICD-10-CM | POA: Diagnosis present

## 2023-02-03 DIAGNOSIS — Z743 Need for continuous supervision: Secondary | ICD-10-CM | POA: Diagnosis not present

## 2023-02-03 DIAGNOSIS — Z9861 Coronary angioplasty status: Secondary | ICD-10-CM

## 2023-02-03 LAB — COMPREHENSIVE METABOLIC PANEL
ALT: 8 U/L (ref 0–44)
AST: 17 U/L (ref 15–41)
Albumin: 3.5 g/dL (ref 3.5–5.0)
Alkaline Phosphatase: 53 U/L (ref 38–126)
Anion gap: 10 (ref 5–15)
BUN: 11 mg/dL (ref 8–23)
CO2: 24 mmol/L (ref 22–32)
Calcium: 8.5 mg/dL — ABNORMAL LOW (ref 8.9–10.3)
Chloride: 102 mmol/L (ref 98–111)
Creatinine, Ser: 0.68 mg/dL (ref 0.44–1.00)
GFR, Estimated: >60 mL/min (ref >60)
Glucose, Bld: 121 mg/dL — ABNORMAL HIGH (ref 70–99)
Potassium: 3.4 mmol/L — ABNORMAL LOW (ref 3.5–5.1)
Sodium: 136 mmol/L (ref 135–145)
Total Bilirubin: 1.4 mg/dL — ABNORMAL HIGH (ref 0.3–1.2)
Total Protein: 6.5 g/dL (ref 6.5–8.1)

## 2023-02-03 LAB — PROTIME-INR
INR: 1.8 — ABNORMAL HIGH (ref 0.8–1.2)
Prothrombin Time: 20.4 s — ABNORMAL HIGH (ref 11.4–15.2)

## 2023-02-03 LAB — TROPONIN I (HIGH SENSITIVITY): Troponin I (High Sensitivity): 367 ng/L (ref <18)

## 2023-02-03 LAB — I-STAT CHEM 8, ED
BUN: 14 mg/dL (ref 8–23)
Calcium, Ion: 0.99 mmol/L — ABNORMAL LOW (ref 1.15–1.40)
Chloride: 103 mmol/L (ref 98–111)
Creatinine, Ser: 0.6 mg/dL (ref 0.44–1.00)
Glucose, Bld: 122 mg/dL — ABNORMAL HIGH (ref 70–99)
HCT: 28 % — ABNORMAL LOW (ref 36.0–46.0)
Hemoglobin: 9.5 g/dL — ABNORMAL LOW (ref 12.0–15.0)
Potassium: 5 mmol/L (ref 3.5–5.1)
Sodium: 138 mmol/L (ref 135–145)
TCO2: 26 mmol/L (ref 22–32)

## 2023-02-03 LAB — HEMOGLOBIN AND HEMATOCRIT, BLOOD
HCT: 27.5 % — ABNORMAL LOW (ref 36.0–46.0)
Hemoglobin: 8.6 g/dL — ABNORMAL LOW (ref 12.0–15.0)

## 2023-02-03 LAB — URINALYSIS, ROUTINE W REFLEX MICROSCOPIC
Bilirubin Urine: NEGATIVE
Glucose, UA: NEGATIVE mg/dL
Hgb urine dipstick: NEGATIVE
Ketones, ur: NEGATIVE mg/dL
Leukocytes,Ua: NEGATIVE
Nitrite: NEGATIVE
Protein, ur: NEGATIVE mg/dL
Specific Gravity, Urine: >1.046 — ABNORMAL HIGH (ref 1.005–1.030)
pH: 7 (ref 5.0–8.0)

## 2023-02-03 LAB — CBC WITH DIFFERENTIAL/PLATELET
Abs Immature Granulocytes: 0.03 10*3/uL (ref 0.00–0.07)
Basophils Absolute: 0 10*3/uL (ref 0.0–0.1)
Basophils Relative: 1 %
Eosinophils Absolute: 0.2 10*3/uL (ref 0.0–0.5)
Eosinophils Relative: 3 %
HCT: 27.7 % — ABNORMAL LOW (ref 36.0–46.0)
Hemoglobin: 8.9 g/dL — ABNORMAL LOW (ref 12.0–15.0)
Immature Granulocytes: 1 %
Lymphocytes Relative: 9 %
Lymphs Abs: 0.6 10*3/uL — ABNORMAL LOW (ref 0.7–4.0)
MCH: 29.9 pg (ref 26.0–34.0)
MCHC: 32.1 g/dL (ref 30.0–36.0)
MCV: 93 fL (ref 80.0–100.0)
Monocytes Absolute: 0.6 10*3/uL (ref 0.1–1.0)
Monocytes Relative: 9 %
Neutro Abs: 5.2 10*3/uL (ref 1.7–7.7)
Neutrophils Relative %: 77 %
Platelets: 165 10*3/uL (ref 150–400)
RBC: 2.98 MIL/uL — ABNORMAL LOW (ref 3.87–5.11)
RDW: 17.1 % — ABNORMAL HIGH (ref 11.5–15.5)
WBC: 6.6 10*3/uL (ref 4.0–10.5)
nRBC: 0 % (ref 0.0–0.2)

## 2023-02-03 LAB — MAGNESIUM: Magnesium: 1.5 mg/dL — ABNORMAL LOW (ref 1.7–2.4)

## 2023-02-03 LAB — BRAIN NATRIURETIC PEPTIDE: B Natriuretic Peptide: 760.6 pg/mL — ABNORMAL HIGH (ref 0.0–100.0)

## 2023-02-03 LAB — LIPASE, BLOOD: Lipase: 39 U/L (ref 11–51)

## 2023-02-03 MED ORDER — ACETAMINOPHEN 325 MG PO TABS
650.0000 mg | ORAL_TABLET | Freq: Four times a day (QID) | ORAL | Status: DC | PRN
  Administered 2023-02-05: 650 mg via ORAL
  Filled 2023-02-03: qty 2

## 2023-02-03 MED ORDER — LOPERAMIDE HCL 2 MG PO CAPS: 2.0000 mg | ORAL_CAPSULE | Freq: Four times a day (QID) | ORAL | Status: DC | PRN

## 2023-02-03 MED ORDER — CALCIUM GLUCONATE-NACL 1-0.675 GM/50ML-% IV SOLN
1.0000 g | Freq: Once | INTRAVENOUS | Status: AC
  Administered 2023-02-03: 1000 mg via INTRAVENOUS
  Filled 2023-02-03: qty 50

## 2023-02-03 MED ORDER — CARVEDILOL 25 MG PO TABS
25.0000 mg | ORAL_TABLET | Freq: Two times a day (BID) | ORAL | Status: DC
  Administered 2023-02-03 – 2023-02-04 (×3): 25 mg via ORAL
  Filled 2023-02-03 (×3): qty 1

## 2023-02-03 MED ORDER — ALBUTEROL SULFATE (2.5 MG/3ML) 0.083% IN NEBU: 2.5000 mg | INHALATION_SOLUTION | Freq: Four times a day (QID) | RESPIRATORY_TRACT | Status: DC | PRN

## 2023-02-03 MED ORDER — ACETAMINOPHEN 500 MG PO TABS
1000.0000 mg | ORAL_TABLET | Freq: Every day | ORAL | Status: DC
  Administered 2023-02-03 – 2023-02-04 (×2): 1000 mg via ORAL
  Filled 2023-02-03 (×2): qty 2

## 2023-02-03 MED ORDER — DIPHENHYDRAMINE HCL 25 MG PO CAPS
50.0000 mg | ORAL_CAPSULE | Freq: Every day | ORAL | Status: DC
  Administered 2023-02-03 – 2023-02-04 (×2): 50 mg via ORAL
  Filled 2023-02-03 (×2): qty 2

## 2023-02-03 MED ORDER — ATORVASTATIN CALCIUM 10 MG PO TABS
20.0000 mg | ORAL_TABLET | Freq: Every evening | ORAL | Status: DC
  Administered 2023-02-03 – 2023-02-04 (×2): 20 mg via ORAL
  Filled 2023-02-03 (×2): qty 2

## 2023-02-03 MED ORDER — SODIUM CHLORIDE 0.9% FLUSH
3.0000 mL | Freq: Two times a day (BID) | INTRAVENOUS | Status: DC
  Administered 2023-02-03 – 2023-02-05 (×4): 3 mL via INTRAVENOUS

## 2023-02-03 MED ORDER — PROTHROMBIN COMPLEX CONC HUMAN 500 UNITS IV KIT
3261.0000 [IU] | PACK | Status: AC
  Administered 2023-02-03: 3261 [IU] via INTRAVENOUS
  Filled 2023-02-03: qty 3261

## 2023-02-03 MED ORDER — HYDRALAZINE HCL 50 MG PO TABS
100.0000 mg | ORAL_TABLET | Freq: Two times a day (BID) | ORAL | Status: DC
  Administered 2023-02-03 – 2023-02-04 (×3): 100 mg via ORAL
  Filled 2023-02-03 (×3): qty 2

## 2023-02-03 MED ORDER — FUROSEMIDE 40 MG PO TABS: 40.0000 mg | ORAL_TABLET | Freq: Every day | ORAL | Status: DC | PRN

## 2023-02-03 MED ORDER — POTASSIUM CHLORIDE CRYS ER 20 MEQ PO TBCR
40.0000 meq | EXTENDED_RELEASE_TABLET | ORAL | Status: AC
  Administered 2023-02-03: 40 meq via ORAL
  Filled 2023-02-03: qty 2

## 2023-02-03 MED ORDER — IOHEXOL 350 MG/ML SOLN
75.0000 mL | Freq: Once | INTRAVENOUS | Status: AC | PRN
  Administered 2023-02-03: 75 mL via INTRAVENOUS

## 2023-02-03 MED ORDER — ACETAMINOPHEN 650 MG RE SUPP: 650.0000 mg | Freq: Four times a day (QID) | RECTAL | Status: DC | PRN

## 2023-02-03 MED ORDER — PANTOPRAZOLE SODIUM 40 MG PO TBEC
40.0000 mg | DELAYED_RELEASE_TABLET | Freq: Two times a day (BID) | ORAL | Status: DC
  Administered 2023-02-04 – 2023-02-05 (×3): 40 mg via ORAL
  Filled 2023-02-03 (×3): qty 1

## 2023-02-03 MED ORDER — MAGNESIUM SULFATE 2 GM/50ML IV SOLN
2.0000 g | Freq: Once | INTRAVENOUS | Status: AC
  Administered 2023-02-03: 2 g via INTRAVENOUS
  Filled 2023-02-03: qty 50

## 2023-02-03 MED ORDER — HYDROCODONE-ACETAMINOPHEN 5-325 MG PO TABS: 1.0000 | ORAL_TABLET | Freq: Four times a day (QID) | ORAL | Status: DC | PRN

## 2023-02-03 MED ORDER — ONDANSETRON HCL 4 MG PO TABS: 4.0000 mg | ORAL_TABLET | Freq: Four times a day (QID) | ORAL | Status: DC | PRN

## 2023-02-03 MED ORDER — HYDROMORPHONE HCL 1 MG/ML IJ SOLN
0.5000 mg | INTRAMUSCULAR | Status: DC | PRN
  Administered 2023-02-03: 0.5 mg via INTRAVENOUS
  Filled 2023-02-03: qty 1

## 2023-02-03 MED ORDER — HYDROMORPHONE HCL 1 MG/ML IJ SOLN
0.5000 mg | Freq: Once | INTRAMUSCULAR | Status: AC
  Administered 2023-02-03: 0.5 mg via INTRAVENOUS
  Filled 2023-02-03: qty 1

## 2023-02-03 MED ORDER — ONDANSETRON HCL 4 MG/2ML IJ SOLN
4.0000 mg | Freq: Once | INTRAMUSCULAR | Status: AC
  Administered 2023-02-03: 4 mg via INTRAVENOUS
  Filled 2023-02-03: qty 2

## 2023-02-03 MED ORDER — DIPHENHYDRAMINE-APAP (SLEEP) 25-500 MG PO TABS: 2.0000 | ORAL_TABLET | Freq: Every day | ORAL | Status: DC

## 2023-02-03 MED ORDER — ONDANSETRON HCL 4 MG/2ML IJ SOLN: 4.0000 mg | Freq: Four times a day (QID) | INTRAMUSCULAR | Status: DC | PRN

## 2023-02-03 NOTE — H&P (Signed)
History and Physical    Patient: [name] [id number] DOB: 09/27/1936 DOA: 02/03/2023 DOS: the patient was seen and examined on 02/03/2023 PCP: Baruch Gouty, FNP  Patient coming from: Home  Chief Complaint:  Chief Complaint  Patient presents with   Abdominal Pain   Post-op Problem   HPI: [name] is a 87 y.o. female with medical history significant of hypertension, hyperlipidemia, CAD, persistent atrial fibrillation on chronic anticoagulation, and severe aortic valve stenosis who just underwent TAVR on 2/6 who presented with complaints of abdominal pain over the last 3 days.  Postoperatively patient hemoglobin w dropped down to 6.3 g/dL for which she received 2 units of packed red blood cells and thereafter been noted to remain stable at 8.7 g/dL prior to discharge on 2/8.  She states pain is mostly on the middle of her stomach and to the right side of her abdomen.  Since the procedure she has had no appetite, but denies having any vomiting or blood in her stools.  Denies any recent fevers, chills, or shortness of breath.  Prior to the procedure patient had been sleeping more and hands were always cold.  Patient still is sleeping a lot, but hands are now warm.  In the emergency department patient was noted to be afebrile with blood pressures elevated up to 190/95, and all other vital signs maintained.  Labs noted hemoglobin 8.9-> 9.5, potassium 3.4, calcium 8.5, and magnesium 1.5.  CT scan of the chest abdomen pelvis noted a large extraperitoneal hematoma on the right side measuring 20 x 13 x 6.5 cm with moderate bilateral pleural effusions right greater than the left with overlying atelectasis.  Patient was given Zofran, Dilaudid IV, and Kcentra.  Review of Systems: As mentioned in the history of present illness. All other systems reviewed and are negative. Past Medical History:  Diagnosis Date   A-fib [facility])    Anxiety    CAD S/P percutaneous coronary angioplasty 1990s,  11/28/2011    a) midRCA PCI 1999 (NIR BMS 3.0 mm x 32 mm); distal  RCA BMS Sept 2012 (Vision BMS 2.5 mm x 15 mm); 12/02: RCA ISR --> Cutting PTCA    Coronary stent restenosis due to progression of disease 11/30/2011   Dyslipidemia, goal LDL below 70 11/28/2011   Essential hypertension    On ACE inhibitor and ARB? As well as atenolol and hydralazine   Gastric ulcer 11/2018   GERD (gastroesophageal reflux disease)    History of blood transfusion    without reaction   Moderate aortic stenosis by prior echocardiogram 08/2011; 08/2013   Echo 11/09/18: EF 60-65%. Gr 2 DD.  No RWMA.  Mild-Mod AS (mean gradient 19 mmHg). Mild-Mod PAH. 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 MAngelena Formand Dr. BCyndia Bent  Type 2 diabetes mellitus with complication, without long-term current use of insulin ([place]    no longer taking diabetic medication - diet controlled   Past Surgical History:  Procedure Laterality Date   ABDOMINAL HYSTERECTOMY     BIOPSY  06/03/2018   Procedure: BIOPSY;  Surgeon: RRogene Houston  MD;  [facility];  Service: Endoscopy;;  gastric    BIOPSY  09/15/2022   Procedure: BIOPSY;  Surgeon: Eloise Harman, DO;  [facility];  Service: Endoscopy;;   BREAST BIOPSY     CARDIAC CATHETERIZATION  2012   performed for angina   CARDIAC CATHETERIZATION     CARDIOVERSION N/A 10/24/2020   Procedure: CARDIOVERSION;  Surgeon: Jerline Pain, MD;  Location: [facility];  Service: Cardiovascular;  Laterality: N/A;   CORONARY ANGIOPLASTY  11/2006   Cutting Balloon of stent in the RCA    ESOPHAGOGASTRODUODENOSCOPY N/A 06/03/2018   Procedure: ESOPHAGOGASTRODUODENOSCOPY (EGD);  Surgeon: Rogene Houston, MD;  [facility];  Service: Endoscopy;  Laterality: N/A;  1:40   ESOPHAGOGASTRODUODENOSCOPY N/A 12/02/2018   Procedure: ESOPHAGOGASTRODUODENOSCOPY (EGD);  Surgeon: Rogene Houston, MD;  [facility];  Service: Endoscopy;  Laterality: N/A;  255   ESOPHAGOGASTRODUODENOSCOPY (EGD) WITH PROPOFOL N/A 09/15/2022   Procedure: ESOPHAGOGASTRODUODENOSCOPY (EGD) WITH PROPOFOL;  Surgeon: Eloise Harman, DO;  [facility];  Service: Endoscopy;  Laterality: N/A;  1:00pm, asa 3   LEFT HEART CATHETERIZATION WITH CORONARY ANGIOGRAM N/A 11/27/2011   Procedure: LEFT HEART CATHETERIZATION WITH CORONARY ANGIOGRAM;  Surgeon: Leonie Man, MD;  Location: [facility] CATH LAB;  Service: Cardiovascular;  Laterality: N/A;   PERCUTANEOUS CORONARY STENT INTERVENTION (PCI-S)  1999   Mid RCA - NIR DMS 3.0 mm x 30 mm, dRCA Vision BMS 2.5 mm x 15 mm    RIGHT HEART CATH AND CORONARY ANGIOGRAPHY N/A 11/24/2022   Procedure: RIGHT HEART CATH AND CORONARY ANGIOGRAPHY;  Surgeon: Leonie Man, MD;  Location: [place] CV LAB;  Service: Cardiovascular;  Laterality: N/A;   TEE WITHOUT CARDIOVERSION N/A 01/27/2023   Procedure: TRANSESOPHAGEAL ECHOCARDIOGRAM;  Surgeon: Burnell Blanks, MD;  Location: [facility];  Service: Open Heart Surgery;  Laterality: N/A;   TRANSTHORACIC ECHOCARDIOGRAM  10/2018   EF 60-65%. Gr 2 DD.  No RWMA.  Mild-Mod AS  (mean gradient 19 mmHg). Mild-Mod PAH.   TRANSTHORACIC ECHOCARDIOGRAM  03/01/2020   EF 70 to 75%.  No or WMA.  Mild LVH.  GRII DD with severe LA enlargement.  Mildly elevated PAP.  Mild MR.  Moderate AS (mean gradient 17 mmHg) with dimensionless index of 0.32.   TUBAL LIGATION     Social History:  reports that she has quit smoking. She has never used smokeless tobacco. She reports that she does not drink alcohol and does not use drugs.  Allergies   Allergen Reactions   Valium Nausea And Vomiting    Family History  Problem Relation Age of Onset   Cancer Mother    Heart disease Father    Heart disease Maternal Grandmother    Heart disease Maternal Grandfather    Heart disease Brother    Nephrolithiasis Brother        accidental death   Heart Problems Brother        CABG    Prior to Admission medications   Medication Sig Start Date End Date Taking? Authorizing Provider  apixaban (ELIQUIS) 2.5 MG TABS tablet Take 1 tablet (2.5 mg total) by mouth 2 (two) times daily. (NEEDS TO BE SEEN BEFORE NEXT REFILL) 12/31/22   Rakes, Connye Burkitt, FNP  atorvastatin (LIPITOR) 20 MG tablet TAKE ONE (1) TABLET EACH DAY Patient taking differently: Take 20 mg by mouth every evening. 12/08/22   Baruch Gouty, FNP  carvedilol (COREG) 25 MG tablet TAKE  ONE TABLET BY MOUTH TWICE DAILY 09/12/22   Baruch Gouty, FNP  diphenhydramine-acetaminophen (TYLENOL PM) 25-500 MG TABS tablet Take 2 tablets by mouth at bedtime.    [provider]  furosemide (LASIX) 40 MG tablet Take 1 tablet (40 mg total) by mouth daily as needed for fluid. 08/06/22   Baruch Gouty, FNP  hydrALAZINE (APRESOLINE) 100 MG tablet TAKE ONE (1) TABLET BY MOUTH TWO (2) TIMES DAILY 09/12/22   Rakes, Connye Burkitt, FNP  hydrocortisone (ANUSOL-HC) 2.5 % rectal cream Place 1 Application rectally 2 (two) times daily. 11/17/22   Mahala Menghini, PA-C  loperamide (IMODIUM A-D) 2 MG tablet Take 2 mg by mouth 4 (four) times daily as needed for diarrhea or loose stools.    [provider]  NITROSTAT 0.4 MG SL tablet DISSOLVE 1 TAB UNDER TOUNGE FOR CHEST PAIN. MAY REPEAT EVERY 5 MINUTES FOR 3 DOSES. IF NO RELIEF CALL 911 OR GO TO ER 11/24/16   Leonie Man, MD  pantoprazole (PROTONIX) 40 MG tablet TAKE 1 TABLET BY MOUTH TWICE DAILY BEFORE MEALS 01/12/23   Mahala Menghini, PA-C  sertraline (ZOLOFT) 25 MG tablet TAKE ONE (1) TABLET BY MOUTH EVERY DAY Patient not taking: Reported on 01/30/2023  12/08/22   Baruch Gouty, FNP  traMADol (ULTRAM) 50 MG tablet TAKE 1 TABLET BY MOUTH EVERY 6 HOURS AS NEEDED FOR PAIN Patient not taking: Reported on 01/30/2023 09/08/22   Charlett Blake, MD    Physical Exam: Vitals:   02/03/23 1120 02/03/23 1145 02/03/23 1515 02/03/23 1534  BP: (!) 149/76 134/76 (!) 146/73   Pulse: 76 82 92   Resp: 19 13 15   $ Temp: 98.4 F (36.9 C)   98.4 F (36.9 C)  TempSrc: Oral   Oral  SpO2: 94% 98% 97%   Weight:      Height:       Exam  Constitutional: Elderly female who appears to  be in no acute distress Eyes: PERRL, lids and conjunctivae normal ENMT: Mucous membranes are moist.   Neck: normal, supple, no masses, no thyromegaly Respiratory: clear to auscultation bilaterally, no wheezing, no crackles. Normal respiratory effort.  use.  Cardiovascular: Irregular irregular.  No lower extremity edema. Abdomen: Mild right-sided tenderness palpation.  Bowel sounds present all 4 quadrants. Musculoskeletal: no clubbing / cyanosis. No joint deformity upper and lower extremities. Good ROM, no contractures. Normal muscle tone.  Skin: no rashes, lesions, ulcers.   Neurologic: CN 2-12 grossly intact.  Able to move all extremities Psychiatric: Normal judgment and insight. Alert and oriented x 3. Normal mood.   Data Reviewed:  EKG revealed atrial fibrillation at 96 bpm.  Reviewed labs, imaging, pertinent records as noted above in HPI.  Assessment and Plan:  Hematoma of the extraperitoneal space Patient underwent TAVR on 2/6 and presents with right-sided abdominal pain.  CT showing extraperitoneal hematoma on the right side measuring 20 x 13 x 6.5 cm with moderate bilateral pleural effusions right greater than the left with overlying atelectasis.  Hemoglobin appears stable 8.9->9.5 g/dL.  Evaluated by vascular surgery for which no surgical intervention recommended. -Admit to a telemetry bed -Serial monitoring of H&H -Hold Eliquis  -Hydrocodone/Dilaudid as needed  for moderate to severe pain respectively  Severe aortic stenosis S/p TAVR Patient underwent TAVR on 2/6 with Dr. Cyndia Bent. -Continue outpatient follow-up  Elevated troponin CAD Acute.  High-sensitivity troponin elevated at 367.  Patient denied any complaints of chest pain.  Prior to the TAVR and  patient underwent right and left heart cath which showed widely patent bare-metal stents in the mid and distal RCA, and trivial disease in the left circumflex system. -Continue to monitor  Persistent atrial fibrillation on chronic anticoagulation Patient currently in atrial fibrillation, but appears rate controlled. -Continue Coreg -Holding Eliquis  Hypocalcemia Hypokalemia Hypomagnesemia Acute.  Initial labs revealed potassium 3.4, calcium 8.5, and magnesium 1.5. -Give calcium gluconate 1 g IV -Give potassium chloride 40 meq p.o. -Give magnesium sulfate 2 g IV -Continue to monitor and replace as needed  Postoperative anemia Stable.  Hemoglobin had dropped down to 6.3 following the TAVR and patient has been transfused 2 units of packed red blood cells with hemoglobin 8.7 prior to discharge.  On admission hemoglobin 8.9. -Serial monitoring of H&H  Chronic diastolic CHF On physical exam patient does not appear grossly fluid overloaded.  Echocardiogram from 2/7 noted EF to be 65-70% with indeterminate diastolic parameters. -Strict I&O's and daily weights -Continue current regimen  Essential hypertension Blood pressures initially elevated up to 190/95. -Continue home blood pressure regimen as tolerated  Diet controlled diabetes mellitus type 2 Blood sugars relatively controlled.  Patient is diet controlled and not on any medications for treatment.  Hyperlipidemia -Continue atorvastatin  DVT prophylaxis: SCDs  Advance Care Planning:   Code Status: Full Code   Consults: Cardiology, vascular surgery,  Family Communication: Daughter updated at bedside  Severity of Illness: The  appropriate patient status for this patient is OBSERVATION. Observation status is judged to be reasonable and necessary in order to provide the required intensity of service to ensure the patient's safety. The patient's presenting symptoms, physical exam findings, and initial radiographic and laboratory data in the context of their medical condition is felt to place them at decreased risk for further clinical deterioration. Furthermore, it is anticipated that the patient will be medically stable for discharge from the hospital within 2 midnights of admission.   Author: Norval Morton, MD 02/03/2023 4:14 PM  For on call review [email or web address].

## 2023-02-03 NOTE — ED Provider Notes (Signed)
[facility] 4E CV SURGICAL PROGRESSIVE CARE Provider Note  CSN: [id number] Arrival date & time: 02/03/23 1010  Chief Complaint(s) Abdominal Pain and Post-op Problem  HPI [name] is a 87 y.o. female with past medical history as below, significant for CAD status post PCI and eventual TAVR 1 week ago, HLD, type 2 diabetes mellitus, hypertension who presents to the ED with complaint of breath abdominal pain, chest pain.  Patient had TAVR 2/6, she was discharged in stable condition.  Was restarted on Eliquis.  Began to have left-sided chest/shoulder pain yesterday, right upper and right lower quadrant abdominal pain for past 24 hours.  No nausea or vomiting, no fevers or chills.  No change to her baseline urine or bowel function.  Tolerating p.o. without much difficulty.  She has not yet seen CTS for follow-up, she was given fentanyl and route which did improve her discomfort.  Dr Angelena Form and Dr Cyndia Bent Status post TAVR, left subclavian 2/6 Patient did have femoral AV access on the right.  Also left subclavian access during procedure.  Past Medical History Past Medical History:  Diagnosis Date   A-fib [facility])    Anxiety    CAD S/P percutaneous coronary angioplasty 1990s, 11/28/2011    a) midRCA PCI 1999 (NIR BMS 3.0 mm x 32 mm); distal  RCA BMS Sept 2012 (Vision BMS 2.5 mm x 15 mm); 12/02: RCA ISR --> Cutting PTCA    Coronary stent restenosis due to progression of disease 11/30/2011   Dyslipidemia, goal LDL below 70 11/28/2011   Essential hypertension    On ACE inhibitor and ARB? As well as atenolol and hydralazine   Gastric ulcer 11/2018   GERD (gastroesophageal reflux disease)    History of blood transfusion    without reaction   Moderate aortic stenosis by prior echocardiogram 08/2011; 08/2013   Echo 11/09/18: EF 60-65%. Gr 2 DD.  No RWMA.  Mild-Mod AS (mean gradient 19 mmHg). Mild-Mod PAH. 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 MAngelena Formand Dr. 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   Bloating 11/17/2022   Heme positive stool 11/17/2022   Anemia 11/17/2022   Abdominal pulsatile mass 11/17/2022   Melena 09/02/2022   Acute blood loss anemia 09/02/2022   Abdominal pain, epigastric 09/02/2022   GERD (gastroesophageal reflux disease) 09/02/2022   Chronic bilateral low back pain without sciatica 05/01/2022   Diet-controlled diabetes mellitus ([place] 01/16/2022   Persistent atrial fibrillation ([place] 10/11/2020   Hypertensive heart disease with chronic diastolic congestive heart failure ([place] 05/11/2017   Presence of stent in right coronary artery, HSRA for ISR in 2012 11/28/2011    Class: History of  Essential hypertension 11/28/2011    Class: History of   Hyperlipidemia 11/28/2011    Class: History of   Severe calcific aortic valve stenosis 11/28/2011   CAD S/P percutaneous coronary angioplasty 07/31/2011   Home Medication(s) Prior to Admission medications   Medication Sig Start Date End Date Taking? Authorizing Provider  atorvastatin (LIPITOR) 20 MG tablet TAKE ONE (1) TABLET EACH DAY Patient taking differently: Take  20 mg by mouth every evening. 12/08/22  Yes Rakes, Connye Burkitt, FNP  carvedilol (COREG) 25 MG tablet TAKE ONE TABLET BY MOUTH TWICE DAILY 09/12/22  Yes Rakes, Connye Burkitt, FNP  diphenhydramine-acetaminophen (TYLENOL PM) 25-500 MG TABS tablet Take 2 tablets by mouth at bedtime.   Yes [provider]  furosemide (LASIX) 40 MG tablet Take 1 tablet (40 mg total) by mouth daily as needed for fluid. 08/06/22  Yes Rakes, Connye Burkitt, FNP  hydrALAZINE (APRESOLINE) 100 MG tablet TAKE ONE (1) TABLET BY MOUTH TWO (2) TIMES DAILY Patient taking differently: Take 100 mg by mouth 2 (two) times daily. 09/12/22  Yes Rakes, Connye Burkitt, FNP  loperamide (IMODIUM A-D) 2 MG tablet Take 2 mg by mouth 4 (four) times daily as needed for diarrhea or loose stools.   Yes [provider]  NITROSTAT 0.4 MG SL tablet DISSOLVE 1 TAB UNDER TOUNGE FOR CHEST PAIN. MAY REPEAT EVERY 5 MINUTES FOR 3 DOSES. IF NO RELIEF CALL 911 OR GO TO ER Patient taking differently: Place 0.4 mg under the tongue every 5 (five) minutes as needed for chest pain. 11/24/16  Yes Leonie Man, MD  oxyCODONE (ROXICODONE) 5 MG immediate release tablet Take 1 tablet (5 mg total) by mouth every 6 (six) hours as needed for up to 5 days for severe pain or moderate pain. 02/05/23 02/10/23 Yes Barb Merino, MD  pantoprazole (PROTONIX) 40 MG tablet TAKE 1 TABLET BY MOUTH TWICE DAILY BEFORE MEALS 01/12/23  Yes Mahala Menghini, PA-C                                                                                                                                    Past Surgical History Past Surgical History:  Procedure Laterality Date   ABDOMINAL HYSTERECTOMY     BIOPSY  06/03/2018   Procedure: BIOPSY;  Surgeon: Rogene Houston, MD;  [facility];  Service: Endoscopy;;  gastric    BIOPSY  09/15/2022   Procedure: BIOPSY;  Surgeon: Eloise Harman, DO;  [facility];  Service: Endoscopy;;   BREAST BIOPSY     CARDIAC CATHETERIZATION  2012    performed for angina   CARDIAC CATHETERIZATION     CARDIOVERSION N/A 10/24/2020   Procedure: CARDIOVERSION;  Surgeon: Jerline Pain, MD;  Location: [facility];  Service: Cardiovascular;  Laterality: N/A;   CORONARY ANGIOPLASTY  11/2006   Cutting Balloon of stent in the RCA   ESOPHAGOGASTRODUODENOSCOPY N/A 06/03/2018   Procedure:  ESOPHAGOGASTRODUODENOSCOPY (EGD);  Surgeon: Rogene Houston, MD;  [facility];  Service: Endoscopy;  Laterality: N/A;  1:40   ESOPHAGOGASTRODUODENOSCOPY N/A 12/02/2018   Procedure: ESOPHAGOGASTRODUODENOSCOPY (EGD);  Surgeon: Rogene Houston, MD;  [facility];  Service: Endoscopy;  Laterality: N/A;  255   ESOPHAGOGASTRODUODENOSCOPY (EGD) WITH PROPOFOL N/A 09/15/2022   Procedure: ESOPHAGOGASTRODUODENOSCOPY (EGD) WITH PROPOFOL;  Surgeon: Eloise Harman, DO;  [facility];  Service: Endoscopy;  Laterality: N/A;  1:00pm, asa 3   LEFT HEART CATHETERIZATION WITH CORONARY ANGIOGRAM N/A 11/27/2011   Procedure: LEFT HEART CATHETERIZATION WITH CORONARY ANGIOGRAM;  Surgeon: Leonie Man, MD;  Location[facility] CATH LAB;  Service: Cardiovascular;  Laterality: N/A;   PERCUTANEOUS CORONARY STENT INTERVENTION (PCI-S)  1999   Mid RCA - NIR DMS 3.0 mm x 30 mm, dRCA Vision BMS 2.5 mm x 15 mm    RIGHT HEART CATH AND CORONARY ANGIOGRAPHY N/A 11/24/2022   Procedure: RIGHT HEART CATH AND CORONARY ANGIOGRAPHY;  Surgeon: Leonie Man, MD;  Location: [facility] CV LAB;  Service: Cardiovascular;  Laterality: N/A;   TEE WITHOUT CARDIOVERSION N/A 01/27/2023   Procedure: TRANSESOPHAGEAL ECHOCARDIOGRAM;  Surgeon: Burnell Blanks, MD;  Location: [facility];  Service: Open Heart Surgery;  Laterality: N/A;   TRANSTHORACIC ECHOCARDIOGRAM  10/2018   EF 60-65%. Gr 2 DD.  No RWMA.  Mild-Mod AS  (mean gradient 19 mmHg). Mild-Mod PAH.   TRANSTHORACIC ECHOCARDIOGRAM  03/01/2020   EF 70 to 75%.  No or WMA.  Mild LVH.  GRII DD with severe LA enlargement.  Mildly  elevated PAP.  Mild MR.  Moderate AS (mean gradient 17 mmHg) with dimensionless index of 0.32.   TUBAL LIGATION     Family History Family History  Problem Relation Age of Onset   Cancer Mother    Heart disease Father    Heart disease Maternal Grandmother    Heart disease Maternal Grandfather    Heart disease Brother    Nephrolithiasis Brother        accidental death   Heart Problems Brother        CABG    Social History Social History   Tobacco Use   Smoking status: Former   Smokeless tobacco: Never   Tobacco comments:    Quit approximately 1993  Vaping Use   Vaping Use: Never used  Substance Use Topics   Alcohol use: No   Drug use: No   Allergies Valium  Review of Systems Review of Systems  Constitutional:  Negative for activity change and fever.  HENT:  Negative for facial swelling and trouble swallowing.   Eyes:  Negative for discharge and redness.  Respiratory:  Negative for cough and shortness of breath.   Cardiovascular:  Positive for chest pain. Negative for palpitations.  Gastrointestinal:  Positive for abdominal pain. Negative for nausea.  Genitourinary:  Negative for dysuria and flank pain.  Musculoskeletal:  Negative for back pain and gait problem.  Skin:  Positive for rash. Negative for pallor.  Neurological:  Negative for syncope and headaches.    Physical Exam Vital Signs  I have reviewed the triage vital signs BP (!) 107/42 (BP Location: Right Arm)   Pulse 79   Temp 97.6 F (36.4 C) (Oral)   Resp 16   Ht 5' 5"$  (1.651 m)   Wt 65.1 kg   SpO2 96%   BMI 23.88 kg/m  Physical Exam Vitals and nursing note reviewed.  Constitutional:  General: She is not in acute distress.    Appearance: Normal appearance. She is not ill-appearing.  HENT:     Head: Normocephalic and atraumatic.     Right Ear: External ear normal.     Left Ear: External ear normal.     Nose: Nose normal.     Mouth/Throat:     Mouth: Mucous membranes are moist.  Eyes:      General: No scleral icterus.       Right eye: No discharge.        Left eye: No discharge.  Cardiovascular:     Rate and Rhythm: Normal rate and regular rhythm.     Pulses: Normal pulses.          Dorsalis pedis pulses are 2+ on the right side and 2+ on the left side.     Heart sounds: Normal heart sounds.     Comments: Extremities warm felt well-perfused, pulses intact all 4 extremities Pulmonary:     Effort: Pulmonary effort is normal. No respiratory distress.     Breath sounds: Normal breath sounds.  Chest:    Abdominal:     General: Abdomen is flat.     Palpations: Abdomen is soft.     Tenderness: There is abdominal tenderness in the right upper quadrant and right lower quadrant.    Musculoskeletal:        General: Normal range of motion.     Cervical back: Normal range of motion.     Right lower leg: No edema.     Left lower leg: No edema.  Skin:    General: Skin is warm and dry.     Capillary Refill: Capillary refill takes less than 2 seconds.  Neurological:     Mental Status: She is alert.  Psychiatric:        Mood and Affect: Mood normal.        Behavior: Behavior normal.     ED Results and Treatments Labs (all labs ordered are listed, but only abnormal results are displayed) Labs Reviewed  CBC WITH DIFFERENTIAL/PLATELET - Abnormal; Notable for the following components:      Result Value   RBC 2.98 (*)    Hemoglobin 8.9 (*)    HCT 27.7 (*)    RDW 17.1 (*)    Lymphs Abs 0.6 (*)    All other components within normal limits  URINALYSIS, ROUTINE W REFLEX MICROSCOPIC - Abnormal; Notable for the following components:   Specific Gravity, Urine >1.046 (*)    All other components within normal limits  PROTIME-INR - Abnormal; Notable for the following components:   Prothrombin Time 20.4 (*)    INR 1.8 (*)    All other components within normal limits  COMPREHENSIVE METABOLIC PANEL - Abnormal; Notable for the following components:   Potassium 3.4 (*)     Glucose, Bld 121 (*)    Calcium 8.5 (*)    Total Bilirubin 1.4 (*)    All other components within normal limits  MAGNESIUM - Abnormal; Notable for the following components:   Magnesium 1.5 (*)    All other components within normal limits  BRAIN NATRIURETIC PEPTIDE - Abnormal; Notable for the following components:   B Natriuretic Peptide 760.6 (*)    All other components within normal limits  HEMOGLOBIN AND HEMATOCRIT, BLOOD - Abnormal; Notable for the following components:   Hemoglobin 8.6 (*)    HCT 27.5 (*)    All other components within normal limits  CBC -  Abnormal; Notable for the following components:   RBC 2.91 (*)    Hemoglobin 8.6 (*)    HCT 26.6 (*)    RDW 16.5 (*)    All other components within normal limits  BASIC METABOLIC PANEL - Abnormal; Notable for the following components:   Glucose, Bld 112 (*)    Calcium 8.7 (*)    All other components within normal limits  HEMOGLOBIN AND HEMATOCRIT, BLOOD - Abnormal; Notable for the following components:   Hemoglobin 8.4 (*)    HCT 25.8 (*)    All other components within normal limits  HEMOGLOBIN AND HEMATOCRIT, BLOOD - Abnormal; Notable for the following components:   Hemoglobin 8.0 (*)    HCT 25.5 (*)    All other components within normal limits  I-STAT CHEM 8, ED - Abnormal; Notable for the following components:   Glucose, Bld 122 (*)    Calcium, Ion 0.99 (*)    Hemoglobin 9.5 (*)    HCT 28.0 (*)    All other components within normal limits  TROPONIN I (HIGH SENSITIVITY) - Abnormal; Notable for the following components:   Troponin I (High Sensitivity) 367 (*)    All other components within normal limits  LIPASE, BLOOD  MAGNESIUM                                                                                                                          Radiology No results found.  Pertinent labs & imaging results that were available during my care of the patient were reviewed by me and considered in my medical  decision making (see MDM for details).  Medications Ordered in ED Medications  ondansetron (ZOFRAN) injection 4 mg (4 mg Intravenous Given 02/03/23 1043)  iohexol (OMNIPAQUE) 350 MG/ML injection 75 mL (75 mLs Intravenous Contrast Given 02/03/23 1258)  HYDROmorphone (DILAUDID) injection 0.5 mg (0.5 mg Intravenous Given 02/03/23 1320)  prothrombin complex conc human (KCENTRA) IVPB 3,261 Units (0 Units Intravenous Stopped 02/03/23 1439)  magnesium sulfate IVPB 2 g 50 mL (0 g Intravenous Stopped 02/03/23 1822)  calcium gluconate 1 g/ 50 mL sodium chloride IVPB (1,000 mg Intravenous New Bag/Given 02/03/23 1823)  potassium chloride SA (KLOR-CON M) CR tablet 40 mEq (40 mEq Oral Given 02/03/23 1706)  furosemide (LASIX) injection 40 mg (40 mg Intravenous Given 02/04/23 1055)  Procedures .Critical Care  Performed by: Jeanell Sparrow, DO Authorized by: Jeanell Sparrow, DO   Critical care provider statement:    Critical care time (minutes):  30   Critical care time was exclusive of:  Separately billable procedures and treating other patients   Critical care was necessary to treat or prevent imminent or life-threatening deterioration of the following conditions:  Cardiac failure   Critical care was time spent personally by me on the following activities:  Development of treatment plan with patient or surrogate, discussions with consultants, evaluation of patient's response to treatment, examination of patient, ordering and review of laboratory studies, ordering and review of radiographic studies, ordering and performing treatments and interventions, pulse oximetry, re-evaluation of patient's condition, review of old charts and obtaining history from patient or surrogate   Care discussed with: admitting provider     (including critical care time)  Medical Decision Making / ED  Course    Medical Decision Making:    [name] is a 87 y.o. female with past medical history as below, significant for CAD status post PCI and eventual TAVR 1 week ago, HLD, type 2 diabetes mellitus, hypertension who presents to the ED with complaint of breath abdominal pain, chest pain.  Patient had TAVR 2/6. The complaint involves an extensive differential diagnosis and also carries with it a high risk of complications and morbidity.  Serious etiology was considered. Ddx includes but is not limited to: Differential includes all life-threatening causes for chest pain. This includes but is not exclusive to acute coronary syndrome, aortic dissection, pulmonary embolism, cardiac tamponade, community-acquired pneumonia, pericarditis, musculoskeletal chest wall pain, Differential diagnosis includes but is not exclusive to ectopic pregnancy, ovarian cyst, ovarian torsion, acute appendicitis, urinary tract infection, endometriosis, bowel obstruction, hernia, colitis, renal colic, gastroenteritis, volvulus etc.   Complete initial physical exam performed, notably the patient  was sitting in wheelchair, mildly hypertensive but not hypoxic.  afbrile.  Abdomen is soft, there is tenderness to the right side, not peritoneal, not rigid.     Reviewed and confirmed nursing documentation for past medical history, family history, social history.  Vital signs reviewed.    Clinical Course as of 02/07/23 1054  Tue Feb 03, 2023  1059 Notified Dr Vivi Martens office that pt was in ED [SG]  1326 CTA "2. Large extraperitoneal abdominal/pelvic hematoma on the right side. It measures approximately 20 x 13 x 6.5 cm. 3. Moderate-sized bilateral pleural effusions, right larger than left with overlying atelectasis." [SG]  1330 Paged CTS given CT findings  [SG]  1334 Took eliquis 2.17m this morning. Give KEppie Gibson updated family at bedside [SG]  1536 Her Trop is elev, EKG is stable. No chest pain. Her pain is primarily  abdomen. She had some chest pain yesterday but none currently. Per Dr BCyndia Bentthis is not an expected abnormality from recent TAVR [SG]  1553 D/w cardio, will come see [SG]  1553 Rec admit for serial h/h, pain control [SG]    Clinical Course User Index [SG] GJeanell Sparrow DO   Trop is elev, no heparin given large hematoma, cardiology was consulted, they will come see. EKG stable, no cp currently  Admitted    Additional history obtained: -Additional history obtained from ems -External records from outside source obtained and reviewed including: Chart review including previous notes, labs, imaging, consultation notes including recent admission documentation, home medications, prior labs and imaging, primary care documentation   Lab Tests: -I ordered, reviewed, and interpreted labs.  The pertinent results include:   Labs Reviewed  CBC WITH DIFFERENTIAL/PLATELET - Abnormal; Notable for the following components:      Result Value   RBC 2.98 (*)    Hemoglobin 8.9 (*)    HCT 27.7 (*)    RDW 17.1 (*)    Lymphs Abs 0.6 (*)    All other components within normal limits  URINALYSIS, ROUTINE W REFLEX MICROSCOPIC - Abnormal; Notable for the following components:   Specific Gravity, Urine >1.046 (*)    All other components within normal limits  PROTIME-INR - Abnormal; Notable for the following components:   Prothrombin Time 20.4 (*)    INR 1.8 (*)    All other components within normal limits  COMPREHENSIVE METABOLIC PANEL - Abnormal; Notable for the following components:   Potassium 3.4 (*)    Glucose, Bld 121 (*)    Calcium 8.5 (*)    Total Bilirubin 1.4 (*)    All other components within normal limits  MAGNESIUM - Abnormal; Notable for the following components:   Magnesium 1.5 (*)    All other components within normal limits  BRAIN NATRIURETIC PEPTIDE - Abnormal; Notable for the following components:   B Natriuretic Peptide 760.6 (*)    All other components within normal limits   HEMOGLOBIN AND HEMATOCRIT, BLOOD - Abnormal; Notable for the following components:   Hemoglobin 8.6 (*)    HCT 27.5 (*)    All other components within normal limits  CBC - Abnormal; Notable for the following components:   RBC 2.91 (*)    Hemoglobin 8.6 (*)    HCT 26.6 (*)    RDW 16.5 (*)    All other components within normal limits  BASIC METABOLIC PANEL - Abnormal; Notable for the following components:   Glucose, Bld 112 (*)    Calcium 8.7 (*)    All other components within normal limits  HEMOGLOBIN AND HEMATOCRIT, BLOOD - Abnormal; Notable for the following components:   Hemoglobin 8.4 (*)    HCT 25.8 (*)    All other components within normal limits  HEMOGLOBIN AND HEMATOCRIT, BLOOD - Abnormal; Notable for the following components:   Hemoglobin 8.0 (*)    HCT 25.5 (*)    All other components within normal limits  I-STAT CHEM 8, ED - Abnormal; Notable for the following components:   Glucose, Bld 122 (*)    Calcium, Ion 0.99 (*)    Hemoglobin 9.5 (*)    HCT 28.0 (*)    All other components within normal limits  TROPONIN I (HIGH SENSITIVITY) - Abnormal; Notable for the following components:   Troponin I (High Sensitivity) 367 (*)    All other components within normal limits  LIPASE, BLOOD  MAGNESIUM    Notable for as above  EKG   EKG Interpretation  Date/Time:    Ventricular Rate:    PR Interval:    QRS Duration:   QT Interval:    QTC Calculation:   R Axis:     Text Interpretation:           Imaging Studies ordered: I ordered imaging studies including cta I independently visualized the following imaging with scope of interpretation limited to determining acute life threatening conditions related to emergency care: large hematoma I independently visualized and interpreted imaging. I agree with the radiologist interpretation   Medicines ordered and prescription drug management: Meds ordered this encounter  Medications   ondansetron (ZOFRAN) injection 4 mg    iohexol (OMNIPAQUE) 350 MG/ML  injection 75 mL   HYDROmorphone (DILAUDID) injection 0.5 mg   prothrombin complex conc human (KCENTRA) IVPB 3,261 Units    Administer at a rate of 3 units/kg/minute up to a maximum rate of 8.4 mL/min (~210 units/minute). Consider changing administration time to weight-based on a case-by-case basis (ie life-threatening bleed or lack of IV access sites).   magnesium sulfate IVPB 2 g 50 mL   calcium gluconate 1 g/ 50 mL sodium chloride IVPB   potassium chloride SA (KLOR-CON M) CR tablet 40 mEq   DISCONTD: sodium chloride flush (NS) 0.9 % injection 3 mL   DISCONTD: acetaminophen (TYLENOL) tablet 650 mg   DISCONTD: acetaminophen (TYLENOL) suppository 650 mg   DISCONTD: ondansetron (ZOFRAN) tablet 4 mg   DISCONTD: ondansetron (ZOFRAN) injection 4 mg   DISCONTD: albuterol (PROVENTIL) (2.5 MG/3ML) 0.083% nebulizer solution 2.5 mg   DISCONTD: HYDROcodone-acetaminophen (NORCO/VICODIN) 5-325 MG per tablet 1 tablet   DISCONTD: atorvastatin (LIPITOR) tablet 20 mg   DISCONTD: carvedilol (COREG) tablet 25 mg   DISCONTD: hydrALAZINE (APRESOLINE) tablet 100 mg   DISCONTD: diphenhydramine-acetaminophen (TYLENOL PM) 25-500 MG per tablet 2 tablet   DISCONTD: pantoprazole (PROTONIX) EC tablet 40 mg   DISCONTD: loperamide (IMODIUM) capsule 2 mg   DISCONTD: furosemide (LASIX) tablet 40 mg   DISCONTD: HYDROmorphone (DILAUDID) injection 0.5 mg   DISCONTD: diphenhydrAMINE (BENADRYL) capsule 50 mg   DISCONTD: acetaminophen (TYLENOL) tablet 1,000 mg   furosemide (LASIX) injection 40 mg   oxyCODONE (ROXICODONE) 5 MG immediate release tablet    Sig: Take 1 tablet (5 mg total) by mouth every 6 (six) hours as needed for up to 5 days for severe pain or moderate pain.    Dispense:  20 tablet    Refill:  0    -I have reviewed the patients home medicines and have made adjustments as needed   Consultations Obtained: I requested consultation with the CTS and [facility],  and discussed lab and  imaging findings as well as pertinent plan - they recommend: hold doac, obs   Cardiac Monitoring: The patient was maintained on a cardiac monitor.  I personally viewed and interpreted the cardiac monitored which showed an underlying rhythm of: NSR  Social Determinants of Health:  Diagnosis or treatment significantly limited by social determinants of health: na   Reevaluation: After the interventions noted above, I reevaluated the patient and found that they have improved  Co morbidities that complicate the patient evaluation  Past Medical History:  Diagnosis Date   A-fib ([place])    Anxiety    CAD S/P percutaneous coronary angioplasty 1990s, 11/28/2011    a) midRCA PCI 1999 (NIR BMS 3.0 mm x 32 mm); distal  RCA BMS Sept 2012 (Vision BMS 2.5 mm x 15 mm); 12/02: RCA ISR --> Cutting PTCA    Coronary stent restenosis due to progression of disease 11/30/2011   Dyslipidemia, goal LDL below 70 11/28/2011   Essential hypertension    On ACE inhibitor and ARB? As well as atenolol and hydralazine   Gastric ulcer 11/2018   GERD (gastroesophageal reflux disease)    History of blood transfusion    without reaction   Moderate aortic stenosis by prior echocardiogram 08/2011; 08/2013   Echo 11/09/18: EF 60-65%. Gr 2 DD.  No RWMA.  Mild-Mod AS (mean gradient 19 mmHg). Mild-Mod PAH. ->  Follow-up echo March 2021 showed mean gradient 17 mmHg (dimensionless index 0.32)-this was reported as moderate-severe AS   Myocardial infarction 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 MAngelena Formand Dr. BCyndia Bent  Type 2 diabetes mellitus with complication, without long-term current use of insulin ([place]    no longer taking diabetic medication - diet controlled       Dispostion: Disposition decision including need for hospitalization was considered, and patient admitted to the hospital.    Final Clinical Impression(s) / ED Diagnoses Final diagnoses:  Hematoma of extraperitoneal space  Pleural effusion     This chart was dictated using voice recognition software.  Despite best efforts to proofread,  errors can occur which can change the documentation meaning.    GWynona DoveA, DO 02/07/23 1054

## 2023-02-03 NOTE — Consult Note (Signed)
Hospital Consult     Reason for Consult:  retroperitoneal hematoma  Referring Physician:  Dr. Pearline Cables MRN #:  [id number]  History of Present Illness: This is a 87 y.o. female recently underwent TAVR 1 week ago.  She was discharged 2 days postoperatively and restarted her home dose Eliquis for atrial fibrillation at that time.  She states that about 3 days ago she was having abdominal pain mostly in the right lower quadrant.  She has had decreased appetite since the time of procedure.  Her bowels continue to work.  She has not had any external bleeding.  No blood pressure checks at home but no altered mental status per the daughter at bedside.  Past Medical History:  Diagnosis Date   A-fib [facility])    Anxiety    CAD S/P percutaneous coronary angioplasty 1990s, 11/28/2011    a) midRCA PCI 1999 (NIR BMS 3.0 mm x 32 mm); distal  RCA BMS Sept 2012 (Vision BMS 2.5 mm x 15 mm); 12/02: RCA ISR --> Cutting PTCA    Coronary stent restenosis due to progression of disease 11/30/2011   Dyslipidemia, goal LDL below 70 11/28/2011   Essential hypertension    On ACE inhibitor and ARB? As well as atenolol and hydralazine   Gastric ulcer 11/2018   GERD (gastroesophageal reflux disease)    History of blood transfusion    without reaction   Moderate aortic stenosis by prior echocardiogram 08/2011; 08/2013   Echo 11/09/18: EF 60-65%. Gr 2 DD.  No RWMA.  Mild-Mod AS (mean gradient 19 mmHg). Mild-Mod PAH. 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 MAngelena Formand Dr. BCyndia Bent  Type 2 diabetes mellitus with complication, without long-term  [place] use of insulin [place]    no longer taking diabetic medication - diet controlled    Past Surgical History:  Procedure Laterality Date   ABDOMINAL HYSTERECTOMY     BIOPSY  06/03/2018   Procedure: BIOPSY;  Surgeon: RRogene Houston MD;  [facility];  Service: Endoscopy;;  gastric    BIOPSY  09/15/2022   Procedure: BIOPSY;  Surgeon: CEloise Harman DO;  [facility];  Service: Endoscopy;;   BREAST BIOPSY     CARDIAC CATHETERIZATION  2012   performed for angina   CARDIAC CATHETERIZATION     CARDIOVERSION N/A 10/24/2020   Procedure: CARDIOVERSION;  Surgeon: SJerline Pain MD;  [facility];  Service: Cardiovascular;  Laterality: N/A;   CORONARY ANGIOPLASTY  11/2006   Cutting Balloon of stent in the RCA   ESOPHAGOGASTRODUODENOSCOPY N/A 06/03/2018   Procedure: ESOPHAGOGASTRODUODENOSCOPY (EGD);  Surgeon: RRogene Houston MD;  [facility];  Service: Endoscopy;  Laterality: N/A;  1:40   ESOPHAGOGASTRODUODENOSCOPY N/A 12/02/2018   Procedure: ESOPHAGOGASTRODUODENOSCOPY (EGD);  Surgeon: RRogene Houston MD;  [facility];  Service: Endoscopy;  Laterality: N/A;  255   ESOPHAGOGASTRODUODENOSCOPY (EGD) WITH PROPOFOL N/A 09/15/2022   Procedure: ESOPHAGOGASTRODUODENOSCOPY (EGD) WITH PROPOFOL;  Surgeon: CEloise Harman DO;  [facility];  Service: Endoscopy;  Laterality: N/A;  1:00pm, asa 3  LEFT HEART CATHETERIZATION WITH CORONARY ANGIOGRAM N/A 11/27/2011   Procedure: LEFT HEART CATHETERIZATION WITH CORONARY ANGIOGRAM;  Surgeon: Leonie Man, MD;  [facility] C[facility] LAB;  Service: Cardiovascular;  Laterality: N/A;   PERCUTANEOUS CORONARY STENT INTERVENTION (PCI-S)  1999   Mid RCA - NIR DMS 3.0 mm x 30 mm, dRCA Vision BMS 2.5 mm x 15 mm    RIGHT HEART CATH AND CORONARY ANGIOGRAPHY N/A 11/24/2022   Procedure: RIGHT HEART CATH AND CORONARY ANGIOGRAPHY;  Surgeon: Leonie Man, MD;  Location: [facility] CV LAB;  Service:  Cardiovascular;  Laterality: N/A;   TEE WITHOUT CARDIOVERSION N/A 01/27/2023   Procedure: TRANSESOPHAGEAL ECHOCARDIOGRAM;  Surgeon: Burnell Blanks, MD;  Location: [facility];  Service: Open Heart Surgery;  Laterality: N/A;   TRANSTHORACIC ECHOCARDIOGRAM  10/2018   EF 60-65%. Gr 2 DD.  No RWMA.  Mild-Mod AS  (mean gradient 19 mmHg). Mild-Mod PAH.   TRANSTHORACIC ECHOCARDIOGRAM  03/01/2020   EF 70 to 75%.  No or WMA.  Mild LVH.  GRII DD with severe LA enlargement.  Mildly elevated PAP.  Mild MR.  Moderate AS (mean gradient 17 mmHg) with dimensionless index of 0.32.   TUBAL LIGATION      Allergies  Allergen Reactions   Valium Nausea And Vomiting    Prior to Admission medications   Medication Sig Start Date End Date Taking? Authorizing Provider  apixaban (ELIQUIS) 2.5 MG TABS tablet Take 1 tablet (2.5 mg total) by mouth 2 (two) times daily. (NEEDS TO BE SEEN BEFORE NEXT REFILL) 12/31/22   Rakes, Connye Burkitt, FNP  atorvastatin (LIPITOR) 20 MG tablet TAKE ONE (1) TABLET EACH DAY Patient taking differently: Take 20 mg by mouth every evening. 12/08/22   Baruch Gouty, FNP  carvedilol (COREG) 25 MG tablet TAKE ONE TABLET BY MOUTH TWICE DAILY 09/12/22   Baruch Gouty, FNP  diphenhydramine-acetaminophen (TYLENOL PM) 25-500 MG TABS tablet Take 2 tablets by mouth at bedtime.    [provider]  furosemide (LASIX) 40 MG tablet Take 1 tablet (40 mg total) by mouth daily as needed for fluid. 08/06/22   Baruch Gouty, FNP  hydrALAZINE (APRESOLINE) 100 MG tablet TAKE ONE (1) TABLET BY MOUTH TWO (2) TIMES DAILY 09/12/22   Rakes, Connye Burkitt, FNP  hydrocortisone (ANUSOL-HC) 2.5 % rectal cream Place 1 Application rectally 2 (two) times daily. 11/17/22   Mahala Menghini, PA-C  loperamide (IMODIUM A-D) 2 MG tablet Take 2 mg by mouth 4 (four) times daily as needed for diarrhea or loose stools.    [provider]  NITROSTAT 0.4 MG SL tablet DISSOLVE 1 TAB UNDER TOUNGE FOR CHEST PAIN. MAY REPEAT EVERY 5  MINUTES FOR 3 DOSES. IF NO RELIEF CALL 911 OR GO TO ER 11/24/16   Leonie Man, MD  pantoprazole (PROTONIX) 40 MG tablet TAKE 1 TABLET BY MOUTH TWICE DAILY BEFORE MEALS 01/12/23   Mahala Menghini, PA-C  sertraline (ZOLOFT) 25 MG tablet TAKE ONE (1) TABLET BY MOUTH EVERY DAY Patient not taking: Reported on 01/30/2023 12/08/22   Baruch Gouty, FNP  traMADol (ULTRAM) 50 MG tablet TAKE 1 TABLET BY MOUTH EVERY 6 HOURS AS NEEDED FOR PAIN Patient not taking: Reported on 01/30/2023 09/08/22   Charlett Blake, MD    Social History   Socioeconomic History   Marital status: Widowed    Spouse name: Carloyn Manner   Number of children: 3   Years of education: Not on file   Highest education  level: Not on file  Occupational History   Occupation: Retired Marine scientist  Tobacco Use   Smoking status: Former   Smokeless tobacco: Never   Tobacco comments:    Quit approximately 1993  Vaping Use   Vaping Use: Never used  Substance and Sexual Activity   Alcohol use: No   Drug use: No   Sexual activity: Not Currently    Birth control/protection: Post-menopausal  Other Topics Concern   Not on file  Social History Narrative   She is a married mother of 15, 2 daughters, 1 son who all live out of state but talks to weekly.   Step-son and his family live next door. Sees often.    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    Quit smoking in '99, denies alcohol.   She volunteers for [employer].   Social Determinants of Health   Financial Resource Strain: Low Risk  (06/18/2022)   Overall Financial Resource Strain (CARDIA)    Difficulty of Paying Living Expenses: Not hard at all  Food Insecurity: No Food Insecurity (01/30/2023)   Hunger Vital Sign    Worried About Running Out of Food in the Last Year: Never true    Ran Out of Food in the Last Year: Never true  Transportation Needs: No Transportation Needs (01/30/2023)   PRAPARE - Hydrologist (Medical): No    Lack of Transportation  (Non-Medical): No  Physical Activity: Inactive (06/18/2022)   Exercise Vital Sign    Days of Exercise per Week: 0 days    Minutes of Exercise per Session: 0 min  Stress: No Stress Concern Present (06/18/2022)   [place]    Feeling of Stress : Only a little  Social Connections: Moderately Integrated (06/18/2022)   Social Connection and Isolation Panel [NHANES]    Frequency of Communication with Friends and Family: More than three times a week    Frequency of Social Gatherings with Friends and Family: More than three times a week    Attends Religious Services: 1 to 4 times per year    Active Member of [employer] or Organizations: No    Attends Archivist Meetings: Never    Marital Status: Married  Human resources officer Violence: Not At Risk (06/18/2022)   Humiliation, Afraid, Rape, and Kick questionnaire    Fear of Current or Ex-Partner: No    Emotionally Abused: No    Physically Abused: No    Sexually Abused: No     Family History  Problem Relation Age of Onset   Cancer Mother    Heart disease Father    Heart disease Maternal Grandmother    Heart disease Maternal Grandfather    Heart disease Brother    Nephrolithiasis Brother        accidental death   Heart Problems Brother        CABG    Review of Systems  Constitutional:        Decreased appetite  HENT: Negative.    Eyes: Negative.   Respiratory:  Positive for shortness of breath.   Cardiovascular: Negative.   Gastrointestinal:  Positive for abdominal pain.  Skin: Negative.   Neurological: Negative.   Endo/Heme/Allergies:  Bruises/bleeds easily.  Psychiatric/Behavioral: Negative.        Physical Examination  Vitals:   02/03/23 1120 02/03/23 1145  BP: (!) 149/76 134/76  Pulse: 76 82  Resp: 19 13  Temp: 98.4 F (  36.9 C)   SpO2: 94% 98%   Body mass index is 22.94 kg/m.  Physical Exam HENT:     Head: Normocephalic.  Cardiovascular:      Rate and Rhythm: Normal rate.  Pulmonary:     Effort: Pulmonary effort is normal.  Abdominal:     General: Abdomen is flat.     Palpations: Abdomen is soft.     Tenderness: There is abdominal tenderness in the right upper quadrant and right lower quadrant.  Skin:    General: Skin is warm and dry.     Capillary Refill: Capillary refill takes less than 2 seconds.  Neurological:     Mental Status: She is alert.      CBC    Component Value Date/Time   WBC 6.6 02/03/2023 1120   RBC 2.98 (L) 02/03/2023 1120   HGB 9.5 (L) 02/03/2023 1201   HGB 9.9 (L) 11/12/2022 1549   HCT 28.0 (L) 02/03/2023 1201   HCT 30.9 (L) 11/12/2022 1549   PLT 165 02/03/2023 1120   PLT 211 11/12/2022 1549   MCV 93.0 02/03/2023 1120   MCV 86 11/12/2022 1549   MCH 29.9 02/03/2023 1120   MCHC 32.1 02/03/2023 1120   RDW 17.1 (H) 02/03/2023 1120   RDW 13.5 11/12/2022 1549   LYMPHSABS 0.6 (L) 02/03/2023 1120   LYMPHSABS 0.9 09/02/2022 1528   MONOABS 0.6 02/03/2023 1120   EOSABS 0.2 02/03/2023 1120   EOSABS 0.1 09/02/2022 1528   BASOSABS 0.0 02/03/2023 1120   BASOSABS 0.0 09/02/2022 1528    BMET    Component Value Date/Time   NA 138 02/03/2023 1201   NA 145 (H) 11/12/2022 1549   K 5.0 02/03/2023 1201   CL 103 02/03/2023 1201   CO2 26 01/29/2023 0059   GLUCOSE 122 (H) 02/03/2023 1201   BUN 14 02/03/2023 1201   BUN 19 11/12/2022 1549   CREATININE 0.60 02/03/2023 1201   CALCIUM 8.2 (L) 01/29/2023 0059   GFRNONAA 57 (L) 01/29/2023 0059   GFRAA >60 09/01/2020 1444    COAGS: Lab Results  Component Value Date   INR 1.8 (H) 02/03/2023   INR 1.5 (H) 01/23/2023   INR 1.17 09/04/2011     Non-Invasive Vascular Imaging:   CT IMPRESSION: 1. Surgical changes from recent TAVR. No complicating features are identified. 2. Large extraperitoneal abdominal/pelvic hematoma on the right side. It measures approximately 20 x 13 x 6.5 cm. 3. Moderate-sized bilateral pleural effusions, right larger than left  with overlying atelectasis. 4. Advanced vascular disease but no aneurysm, dissection or significant stenosis. 5. Suspect changes of hepatic congestion given the amount of reflux of contrast down the IVC and into the hepatic veins. 6. Moderate amount of free abdominal/pelvic fluid. 7. Diffuse body wall edema.  ASSESSMENT/PLAN: This is a 87 y.o. female with large right retroperitoneal hematoma status post TAVR.  No evidence of active bleeding patient hemodynamically stable and with stable H&H from prior to discharge.  I discussed with the patient and family available at bedside that I would not recommend any intervention at this time given aforementioned stability of hemodynamics and hematocrit.  If patient has any changes would consider repeat scanning otherwise treat expectantly with pain control and trend H&H with Eliquis held.  Will be available as needed.  Travious Vanover C. Donzetta Matters, MD [facility] Office: [phone] Pager: [phone]

## 2023-02-03 NOTE — Telephone Encounter (Signed)
I spoke with Colletta Maryland in regards to the patient having pain and she has already called 911.  The patient is experiencing significant pain in her right groin. From my discussion with Colletta Maryland it sounds like the pain started yesterday and was 8/10 and then during the night 4/10 but now 9/10.  For the pt's TAVR the right groin did have an arterial and venous line placed (temp wire and pigtail catheter), the access for TAVR was left subclavian and the pt is not having any pain at this site. I asked Colletta Maryland to call me with updates after EMS evaluates the patient.

## 2023-02-03 NOTE — Telephone Encounter (Signed)
Colletta Maryland called and EMS is at the home.  Per EMS the pt's right side at abdomen/hip is rigid and feels like there is fluid under the skin.  They have removed the pt's dressing at right groin and there are no signs of bleeding or infection.  The pt does have bruising at right groin but the area is soft, without hematoma.  EMS plans to transport the pt to [facility] for further evaluation.

## 2023-02-03 NOTE — ED Notes (Signed)
Patient transported to CT 

## 2023-02-03 NOTE — Consult Note (Signed)
[place] VALVE TEAM  Cardiology Consultation:   Patient ID: [name] MRN: [id number]; DOB: 12-Feb-1936  Admit date: 02/03/2023 Date of Consult: 02/03/2023  Primary Care Provider: Baruch Gouty, [facility] Cardiologist: Glenetta Hew, MD  / Dr. Angelena Form, MD and Dr. Cyndia Bent, MD (TAVR)   Patient Profile:   [name] is a 87 y.o. female with a hx of persistent atrial fibrillation, CAD with prior PCI, chronic diastolic CHF, HTN, HLD, DM diet controlled and severe aortic stenosis who recently underwent TAVR 01/27/23 and was discharged 01/29/23.   History of Present Illness:   [name] is now s/p successful TAVR with a 23 mm Edwards Sapien 3 THV via the [place] approach on 01/27/23. Post operative echo showed stable valve function with worsening TR however felt to be secondary to volume overload with the need for transfusion with a post op Hb at 6.3. She remained hemodynamically stable with no overt s/s of bleeding. She was ultimately received 2u PRBCs with improvement in Hb to 8.7 by day of discharge. She was feeling better with no complaints and was discharged home 01/29/23 with plans for TOC follow up 02/06/23.   Structural heart team conducted [facility] call 2/9 at which time she was doing well. She then called back today stating that she was having right sided abdominal pain but no overt groin bleeding. Due to her pain level, her daughter called EMS for transport to [facility] for further evaluation.   In the ED, CT imaging showed a large extraperitoneal hematoma measuring 20 x 13 x 6.5 cm. She was treated with pain medication and VVS was consulted. Hb remains stable at 9.5 (improved from discharge). HsT found to be elevated at 367. She has know CAD but pre TAVR R/LHC showed patent stents with no other occlusive disease. K+ at 3.4. Mg+ at 1.5. CXR shows stable cardiomegaly with stable small right pleural effusions with streaky bibasilar lung opacities  which favor atelectasis.       Past Medical History:  Diagnosis Date   A-fib [facility])    Anxiety    CAD S/P percutaneous coronary angioplasty 1990s, 11/28/2011    a) midRCA PCI 1999 (NIR BMS 3.0 mm x 32 mm); distal  RCA BMS Sept 2012 (Vision BMS 2.5 mm x 15 mm); 12/02: RCA ISR --> Cutting PTCA    Coronary stent restenosis due to progression of disease 11/30/2011   Dyslipidemia, goal LDL below 70 11/28/2011   Essential hypertension    On ACE inhibitor and ARB? As well as atenolol and hydralazine   Gastric ulcer 11/2018   GERD (gastroesophageal reflux disease)    History of blood transfusion    without reaction   Moderate aortic stenosis by prior echocardiogram 08/2011; 08/2013   Echo 11/09/18: EF 60-65%. Gr 2 DD.  No RWMA.  Mild-Mod AS (mean gradient 19 mmHg). Mild-Mod PAH. 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 MAngelena Formand Dr. BCyndia Bent  Type 2 diabetes mellitus with complication, without long-term current use of insulin [place]    no longer taking diabetic  medication - diet controlled    Past Surgical History:  Procedure Laterality Date   ABDOMINAL HYSTERECTOMY     BIOPSY  06/03/2018   Procedure: BIOPSY;  Surgeon: Rogene Houston, MD;  [facility];  Service: Endoscopy;;  gastric    BIOPSY  09/15/2022   Procedure: BIOPSY;  Surgeon: Eloise Harman, DO;  [facility];  Service: Endoscopy;;   BREAST BIOPSY     CARDIAC CATHETERIZATION  2012   performed for angina   CARDIAC CATHETERIZATION     CARDIOVERSION N/A 10/24/2020   Procedure: CARDIOVERSION;  Surgeon: Jerline Pain, MD;   Location: [facility];  Service: Cardiovascular;  Laterality: N/A;   CORONARY ANGIOPLASTY  11/2006   Cutting Balloon of stent in the RCA   ESOPHAGOGASTRODUODENOSCOPY N/A 06/03/2018   Procedure: ESOPHAGOGASTRODUODENOSCOPY (EGD);  Surgeon: Rogene Houston, MD;  [facility];  Service: Endoscopy;  Laterality: N/A;  1:40   ESOPHAGOGASTRODUODENOSCOPY N/A 12/02/2018   Procedure: ESOPHAGOGASTRODUODENOSCOPY (EGD);  Surgeon: Rogene Houston, MD;  [facility];  Service: Endoscopy;  Laterality: N/A;  255   ESOPHAGOGASTRODUODENOSCOPY (EGD) WITH PROPOFOL N/A 09/15/2022   Procedure: ESOPHAGOGASTRODUODENOSCOPY (EGD) WITH PROPOFOL;  Surgeon: Eloise Harman, DO;  [facility];  Service: Endoscopy;  Laterality: N/A;  1:00pm, asa 3   LEFT HEART CATHETERIZATION WITH CORONARY ANGIOGRAM N/A 11/27/2011   Procedure: LEFT HEART CATHETERIZATION WITH CORONARY ANGIOGRAM;  Surgeon: Leonie Man, MD;  Location: [facility] CATH LAB;  Service: Cardiovascular;  Laterality: N/A;   PERCUTANEOUS CORONARY STENT INTERVENTION (PCI-S)  1999   Mid RCA - NIR DMS 3.0 mm x 30 mm, dRCA Vision BMS 2.5 mm x 15 mm    RIGHT HEART CATH AND CORONARY ANGIOGRAPHY N/A 11/24/2022   Procedure: RIGHT HEART CATH AND CORONARY ANGIOGRAPHY;  Surgeon: Leonie Man, MD;  Location: [place] CV LAB;  Service: Cardiovascular;  Laterality: N/A;   TEE WITHOUT CARDIOVERSION N/A 01/27/2023   Procedure: TRANSESOPHAGEAL ECHOCARDIOGRAM;  Surgeon: Burnell Blanks, MD;  Location: [facility];  Service: Open Heart Surgery;  Laterality: N/A;   TRANSTHORACIC ECHOCARDIOGRAM  10/2018   EF 60-65%. Gr 2 DD.  No RWMA.  Mild-Mod AS  (mean gradient 19 mmHg). Mild-Mod PAH.   TRANSTHORACIC ECHOCARDIOGRAM  03/01/2020   EF 70 to 75%.  No or WMA.  Mild LVH.  GRII DD with severe LA enlargement.  Mildly elevated PAP.  Mild MR.  Moderate AS (mean gradient 17 mmHg) with dimensionless index of 0.32.   TUBAL LIGATION       Home Medications:  Prior  to Admission medications   Medication Sig Start Date End Date Taking? Authorizing Provider  apixaban (ELIQUIS) 2.5 MG TABS tablet Take 1 tablet (2.5 mg total) by mouth 2 (two) times daily. (NEEDS TO BE SEEN BEFORE NEXT REFILL) 12/31/22   Rakes, Connye Burkitt, FNP  atorvastatin (LIPITOR) 20 MG tablet TAKE ONE (1) TABLET EACH DAY Patient taking differently: Take 20 mg by mouth every evening. 12/08/22   Baruch Gouty, FNP  carvedilol (COREG) 25 MG tablet TAKE ONE TABLET BY MOUTH TWICE DAILY 09/12/22   Baruch Gouty, FNP  diphenhydramine-acetaminophen (TYLENOL PM) 25-500 MG TABS tablet Take 2 tablets by mouth at bedtime.    [provider]  furosemide (LASIX) 40 MG tablet Take 1 tablet (40 mg total) by mouth daily as needed for fluid. 08/06/22   Baruch Gouty, FNP  hydrALAZINE (APRESOLINE) 100 MG tablet TAKE ONE (1) TABLET BY  MOUTH TWO (2) TIMES DAILY 09/12/22   Baruch Gouty, FNP  hydrocortisone (ANUSOL-HC) 2.5 % rectal cream Place 1 Application rectally 2 (two) times daily. 11/17/22   Mahala Menghini, PA-C  loperamide (IMODIUM A-D) 2 MG tablet Take 2 mg by mouth 4 (four) times daily as needed for diarrhea or loose stools.    [provider]  NITROSTAT 0.4 MG SL tablet DISSOLVE 1 TAB UNDER TOUNGE FOR CHEST PAIN. MAY REPEAT EVERY 5 MINUTES FOR 3 DOSES. IF NO RELIEF CALL 911 OR GO TO ER 11/24/16   Leonie Man, MD  pantoprazole (PROTONIX) 40 MG tablet TAKE 1 TABLET BY MOUTH TWICE DAILY BEFORE MEALS 01/12/23   Mahala Menghini, PA-C  sertraline (ZOLOFT) 25 MG tablet TAKE ONE (1) TABLET BY MOUTH EVERY DAY Patient not taking: Reported on 01/30/2023 12/08/22   Baruch Gouty, FNP  traMADol (ULTRAM) 50 MG tablet TAKE 1 TABLET BY MOUTH EVERY 6 HOURS AS NEEDED FOR PAIN Patient not taking: Reported on 01/30/2023 09/08/22   Charlett Blake, MD    Inpatient Medications: Scheduled Meds:  Continuous Infusions:  PRN Meds:   Allergies:    Allergies  Allergen Reactions   Valium Nausea And  Vomiting    Social History:   Social History   Socioeconomic History   Marital status: Widowed    Spouse name: Carloyn Manner   Number of children: 3   Years of education: Not on file   Highest education level: Not on file  Occupational History   Occupation: Retired Marine scientist  Tobacco Use   Smoking status: Former   Smokeless tobacco: Never   Tobacco comments:    Quit approximately 1993  Vaping Use   Vaping Use: Never used  Substance and Sexual Activity   Alcohol use: No   Drug use: No   Sexual activity: Not Currently    Birth control/protection: Post-menopausal  Other Topics Concern   Not on file  Social History Narrative   She is a married mother of 28, 2 daughters, 1 son who all live out of state but talks to weekly.   Step-son and his family live next door. Sees often.    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    Quit smoking in '99, denies alcohol.   She volunteers for [employer].   Social Determinants of Health   Financial Resource Strain: Low Risk  (06/18/2022)   Overall Financial Resource Strain (CARDIA)    Difficulty of Paying Living Expenses: Not hard at all  Food Insecurity: No Food Insecurity (01/30/2023)   Hunger Vital Sign    Worried About Running Out of Food in the Last Year: Never true    Ran Out of Food in the Last Year: Never true  Transportation Needs: No Transportation Needs (01/30/2023)   PRAPARE - Hydrologist (Medical): No    Lack of Transportation (Non-Medical): No  Physical Activity: Inactive (06/18/2022)   Exercise Vital Sign    Days of Exercise per Week: 0 days    Minutes of Exercise per Session: 0 min  Stress: No Stress Concern Present (06/18/2022)   [place]    Feeling of Stress : Only a little  Social Connections: Moderately Integrated (06/18/2022)   Social Connection and Isolation Panel [NHANES]    Frequency of Communication with Friends and  Family: More than three times a week    Frequency of Social Gatherings with Friends  and Family: More than three times a week    Attends Religious Services: 1 to 4 times per year    Active Member of Clubs or Organizations: No    Attends Archivist Meetings: Never    Marital Status: Married  Human resources officer Violence: Not At Risk (06/18/2022)   Humiliation, Afraid, Rape, and Kick questionnaire    Fear of Current or Ex-Partner: No    Emotionally Abused: No    Physically Abused: No    Sexually Abused: No    Family History:    Family History  Problem Relation Age of Onset   Cancer Mother    Heart disease Father    Heart disease Maternal Grandmother    Heart disease Maternal Grandfather    Heart disease Brother    Nephrolithiasis Brother        accidental death   Heart Problems Brother        CABG    ROS:  Please see the history of present illness.   All other ROS reviewed and negative.     Physical Exam/Data:   Vitals:   02/03/23 1120 02/03/23 1145 02/03/23 1515 02/03/23 1534  BP: (!) 149/76 134/76 (!) 146/73   Pulse: 76 82 92   Resp: 19 13 15   $ Temp: 98.4 F (36.9 C)   98.4 F (36.9 C)  TempSrc: Oral   Oral  SpO2: 94% 98% 97%   Weight:      Height:       No intake or output data in the 24 hours ending 02/03/23 1600    02/03/2023   10:18 AM 01/29/2023    5:00 AM 01/27/2023    8:11 AM  Last 3 Weights  Weight (lbs) 140 lb 139 lb 141 lb 1.5 oz  Weight (kg) 63.504 kg 63.05 kg 64 kg     Body mass index is 22.94 kg/m.   General:  Well nourished, well developed, elderly woman in NAD HEENT: normal Lymph: no adenopathy Neck: no JVD Endocrine:  No thryomegaly Vascular: No carotid bruits;  Cardiac:  normal S1, S2; RRR; no murmur  Lungs:  clear to auscultation bilaterally, no wheezing, rhonchi or rales  Abd: firm over the right abdomen, diffuse tenderness in the right lower quadrant. No flank tenderness Ext: no edema Musculoskeletal:  No deformities, BUE and  BLE strength normal and equal Skin: warm and dry  Neuro:  CNs 2-12 intact, no focal abnormalities noted Psych:  Normal affect    Relevant CV Studies:  [facility]  TAVR OPERATIVE NOTE     Date of Procedure:                01/27/2023   Preoperative Diagnosis:      Severe Aortic Stenosis    Postoperative Diagnosis:    Same    Procedure:        Transcatheter Aortic Valve Replacement - TransSubclavian Approach             Edwards Sapien 3 THV (size 23 mm, model # Q151231, serial # [id number] )              Co-Surgeons:                        Lauree Chandler, MD and Gilford Raid , MD    Anesthesiologist:                  Tobias Alexander   Echocardiographer:  Croitoru   Pre-operative Echo Findings: Severe aortic stenosis Normal left ventricular systolic function   Post-operative Echo Findings: No paravalvular leak Normal left ventricular systolic function   _____________   Echo 01/28/23:    1. Left ventricular ejection fraction, by estimation, is 65 to 70%. The  left ventricle has normal function. The left ventricle has no regional  wall motion abnormalities. There is mild concentric left ventricular  hypertrophy. Left ventricular diastolic  function could not be evaluated. There is the interventricular septum is  flattened in diastole ('D' shaped left ventricle), consistent with right  ventricular volume overload.   2. Right ventricular systolic function is mildly reduced. The right  ventricular size is moderately enlarged. There is mildly elevated  pulmonary artery systolic pressure. The estimated right ventricular  systolic pressure is 0000000 mmHg.   3. Left atrial size was severely dilated.   4. Right atrial size was severely dilated.   5. The mitral valve is normal in structure. Mild mitral valve  regurgitation.   6. The tricuspid valve is abnormal. Tricuspid valve regurgitation is  severe.   7. The aortic valve has been repaired/replaced.  Aortic valve  regurgitation is not visualized. There is a 23 mm Sapien prosthetic (TAVR)  valve present in the aortic position. Procedure Date: 01/27/2023. Echo  findings are consistent with normal structure  and function of the aortic valve prosthesis. Aortic valve mean gradient  measures 6.0 mmHg. Aortic valve Vmax measures 1.69 m/s. Aortic valve  acceleration time measures 53 msec.   8. The inferior vena cava is dilated in size with <50% respiratory  variability, suggesting right atrial pressure of 15 mmHg.   Comparison(s): Prior images reviewed side by side. Tricuspid insufficiency  is worse.   Laboratory Data:  High Sensitivity Troponin:   Recent Labs  Lab 02/03/23 1320  TROPONINIHS 367*     Chemistry Recent Labs  Lab 01/28/23 0203 01/29/23 0059 02/03/23 1201 02/03/23 1320  NA 136 134* 138 136  K 2.9* 3.3* 5.0 3.4*  CL 102 98 103 102  CO2 24 26  --  24  GLUCOSE 125* 128* 122* 121*  BUN 17 16 14 11  $ CREATININE 0.88 0.98 0.60 0.68  CALCIUM 8.0* 8.2*  --  8.5*  GFRNONAA >60 57*  --  >60  ANIONGAP 10 10  --  10    Recent Labs  Lab 02/03/23 1320  PROT 6.5  ALBUMIN 3.5  AST 17  ALT 8  ALKPHOS 53  BILITOT 1.4*   Hematology Recent Labs  Lab 01/28/23 0950 01/29/23 0059 02/03/23 1120 02/03/23 1201  WBC 7.2 7.2 6.6  --   RBC 2.70* 2.96* 2.98*  --   HGB 7.8* 8.7* 8.9* 9.5*  HCT 23.6* 26.2* 27.7* 28.0*  MCV 87.4 88.5 93.0  --   MCH 28.9 29.4 29.9  --   MCHC 33.1 33.2 32.1  --   RDW 15.3 15.3 17.1*  --   PLT 172 142* 165  --    BNPNo results for input(s): "BNP", "PROBNP" in the last 168 hours.  DDimer No results for input(s): "DDIMER" in the last 168 hours.   Radiology/Studies:  CT Angio Chest/Abd/Pel for Dissection W and/or Wo Contrast  Result Date: 02/03/2023 CLINICAL DATA:  Recent TAVR a shin with chest pain. CT scan 12/03/2022 EXAM: CT ANGIOGRAPHY CHEST, ABDOMEN AND PELVIS TECHNIQUE: Non-contrast CT of the chest was initially obtained. Multidetector  CT imaging through the chest, abdomen and pelvis was performed using the standard protocol during bolus  administration of intravenous contrast. Multiplanar reconstructed images and MIPs were obtained and reviewed to evaluate the vascular anatomy. RADIATION DOSE REDUCTION: This exam was performed according to the departmental dose-optimization program which includes automated exposure control, adjustment of the mA and/or kV according to patient size and/or use of iterative reconstruction technique. CONTRAST:  18m OMNIPAQUE IOHEXOL 350 MG/ML SOLN COMPARISON:  CT scan 12/03/2022 FINDINGS: CTA CHEST FINDINGS Cardiovascular: The heart is borderline enlarged but stable. No pericardial effusion. Surgical changes from recent TAVR. No complicating features are identified. No aortic aneurysm or dissection. Stable atherosclerotic calcifications mainly involving the aortic arch and descending thoracic aorta. Stable three-vessel coronary artery calcifications. The pulmonary arteries are unremarkable. No findings suspicious for pulmonary embolism. Significant reflux of contrast down the IVC and into the hepatic veins could be due to tricuspid regurgitation. Mediastinum/Nodes: No mediastinal or hilar mass or lymphadenopathy the. Moderate-sized hiatal hernia again noted. Lungs/Pleura: Moderate-sized bilateral pleural effusions, right larger than left with overlying atelectasis. No pulmonary edema. No pneumothorax. Musculoskeletal: No breast masses, supraclavicular or axillary adenopathy. Mild diffuse body wall edema noted. The bony thorax is intact. No bone lesions or fractures. Review of the MIP images confirms the above findings. CTA ABDOMEN AND PELVIS FINDINGS VASCULAR Aorta: Normal caliber. No dissection. Advanced atherosclerotic calcification. Celiac: Moderate ostial calcifications but no significant stenosis. SMA: Ostial calcifications but no stenosis or aneurysm. Renals: Bilateral renal artery calcifications with moderate  stenosis bilaterally. Second smaller left renal artery is unremarkable. IMA: Patent Inflow: Advanced atherosclerotic calcifications but no significant stenosis, aneurysm or dissection. Veins: Grossly normal. Review of the MIP images confirms the above findings. NON-VASCULAR Hepatobiliary: No hepatic lesions or intrahepatic biliary dilatation. The gallbladder is grossly normal. Suspect changes of hepatic congestion given the amount of reflux of contrast. The gallbladder is grossly normal. No common bile duct dilatation. Pancreas: No mass, inflammation ductal dilatation. Spleen: Normal size.  No focal lesions. Adrenals/Urinary Tract: The adrenal glands and kidneys are grossly normal. The bladder is unremarkable. Stomach/Bowel: The stomach, duodenum, small bowel and colon are grossly normal. No obvious obstructive findings. Lymphatic: No abdominal or pelvic lymphadenopathy. Reproductive: Surgically absent. Other: There is a large extraperitoneal hematoma on the right side extending from the pelvis poly up to the liver. It measures approximately 20 x 13 x 6.5 cm. No large inguinal hematoma. There is also a moderate amount of simple appearing fluid surrounding the liver and spleen and also in the pelvis. Diffuse body wall edema is also noted. Musculoskeletal: No significant bony findings. Review of the MIP images confirms the above findings. IMPRESSION: 1. Surgical changes from recent TAVR. No complicating features are identified. 2. Large extraperitoneal abdominal/pelvic hematoma on the right side. It measures approximately 20 x 13 x 6.5 cm. 3. Moderate-sized bilateral pleural effusions, right larger than left with overlying atelectasis. 4. Advanced vascular disease but no aneurysm, dissection or significant stenosis. 5. Suspect changes of hepatic congestion given the amount of reflux of contrast down the IVC and into the hepatic veins. 6. Moderate amount of free abdominal/pelvic fluid. 7. Diffuse body wall edema. These  results will be called to the ordering clinician or representative by the Radiologist Assistant, and communication documented in the PACS or [employer] Electronically Signed   By: PMarijo SanesM.D.   On: 02/03/2023 13:18   DG Chest Port 1 View  Result Date: 02/03/2023 CLINICAL DATA:  cp, recent tavr, abdominal pain since last night EXAM: PORTABLE CHEST 1 VIEW COMPARISON:  01/23/2023 chest radiograph. FINDINGS: Aortic valve prosthesis in  place. Surgical clips overlie left axilla. Stable cardiomediastinal silhouette with mild to moderate cardiomegaly. No pneumothorax. Stable small right pleural effusion. No left pleural effusion. No overt pulmonary edema. Streaky hazy bibasilar lung opacities, favor atelectasis. IMPRESSION: 1. Stable mild to moderate cardiomegaly without overt pulmonary edema. 2. Stable small right pleural effusion. 3. Streaky hazy bibasilar lung opacities, favor atelectasis. Electronically Signed   By: Ilona Sorrel M.D.   On: 02/03/2023 11:13    Assessment and Plan:   Large peritoneal hematoma: Pt recently underwent TAVR 2/6 and discharged 2/8. Pt called today with severe right sided abdominal pain. CT imaging showed a large extraperitoneal hematoma measuring 20 x 13 x 6.5 cm. She was treated with pain medication and VVS was consulted. Hb remains stable at 9.5 (improved from discharge). Plan was to treat her pain and follow H&H for no with no utility in surgical intervention at this time. Hold Eliquis.   CAD s/p PCI: Pre TAVR R/LHC with patent pLAD and pRCA stents with no other occlusive disease noted. HsT on ED arrival noted to be elevated at 367. Denies anginal pain. EKG with lateral changes however noted on prior EKG tracings.   Severe AS: s/p successful TAVR with a 23 mm Edwards Sapien 3 THV via the [place] approach on 01/27/23. Post operative echo showed stable valve function with a mean gradient at 28mHg and AVA at 2.92cm2. Eliquis was held until day of discharge.   Post op  anemia: Post TAVR CBC returned with a Hb at 6.3 however no other overt s/s of acute bleeding. Patient received 2u PRBCs with improvement in Hb to 8.7.  Felt to be dilutional per Dr. BCyndia Bent Hb today at 9.5.    Persistent atrial fibrillation: Eliquis held post TAVR until 2/8 due to anemia. Continue holding for now given large hematoma.    Chronic diastolic CHF: Appears euvolic on exam today. Given one dose IV lasix between PRBCs. Resume home regimen once discharged.    HTN: Stable, 146/73.    DM (diet controlled): SSI while inpatient.    Hypomagnesemia: Noted to be 1.5 today. Will replaced with 2g IV Mag.    Hypokalemia: K+ at 3.4 today. Will replace with 426m Kdur. Trend with BMET.   For questions or updates, please contact [place] consult [email or web address] for contact info under    Signed, JiKathyrn DrownNP  02/03/2023 4:00 PM   Patient seen, examined. Available data reviewed. Agree with findings, assessment, and plan as outlined by JiKathyrn DrownNP.  The patient is independently interviewed and examined.  My physical exam findings are reflected above.  The patient's CT images are personally reviewed and there is a large right extraperitoneal hematoma present.  This is associated with tenderness and pain on examination. Her HgB is stable from recent hospital DC. While her symptoms have worsened over the last few days, she doesn't appear to be actively bleeding. Appreciate vascular surgery consultation by Dr CaDonzetta Mattersnd personally discussed with him. Plan: pain control, serial Hgb, mobilize as tolerated, hold apixaban x at least 1 week possibly 2 weeks depending on her clinical progress. Will follow with you. thanks  MiSherren MochaM.D. 02/03/2023 5:16 PM

## 2023-02-03 NOTE — ED Notes (Signed)
4E notified that patient will be coming to the floor

## 2023-02-03 NOTE — ED Triage Notes (Addendum)
EMS stated pt. Stated, pt had a aortic valve replacement and used the femoral artery and the brachial for insertion. This was last Tuesday and since then pt has has continuous pain on the right abdominal area with some petechiae in the area on the right side of abdomen. No N/V/ no falls just pain. Pt is very ridgid on the right side of abdomen. IV 20g left forearm and given 100 mcq of Fentanyl Started back on Elaquis on Sunday

## 2023-02-03 NOTE — Telephone Encounter (Signed)
Pt's daughter would like a callback regarding pt's pain from having TAVR done last week. She states that pt's pain level is about a 9 out of 10. Please advise

## 2023-02-03 NOTE — ED Notes (Signed)
ED TO INPATIENT HANDOFF REPORT  ED Nurse Name and Phone #: [phone]  S Name/Age/Gender [name] 87 y.o. female Room/Bed: 001C/001C  Code Status   Code Status: Prior  Home/SNF/Other Home Patient oriented to: self, place, time, and situation Is this baseline? Yes   Triage Complete: Triage complete  Chief Complaint Retroperitoneal hematoma [K68.3]  Triage Note EMS stated pt. Stated, pt had a aortic valve replacement and used the femoral artery and the brachial for insertion. This was last Tuesday and since then pt has has continuous pain on the right abdominal area with some petechiae in the area on the right side of abdomen. No N/V/ no falls just pain. Pt is very ridgid on the right side of abdomen. IV 20g left forearm and given 100 mcq of Fentanyl Started back on Elaquis on Sunday   Allergies Allergies  Allergen Reactions   Valium Nausea And Vomiting    Level of Care/Admitting Diagnosis ED Disposition     ED Disposition  Admit   Condition  --   [place]: [place] [[id number]]  Level of Care: Telemetry Medical [104]  May place patient in observation at [facility] or [place] if equivalent level of care is available:: No  Covid Evaluation: Asymptomatic - no recent exposure (last 10 days) testing not required  Diagnosis: Retroperitoneal hematoma [292865]  Admitting Physician: Norval Morton [id number]  Attending Physician: Norval Morton [id number]          B Medical/Surgery History Past Medical History:  Diagnosis Date   A-fib ([place])    Anxiety    CAD S/P percutaneous coronary angioplasty 1990s, 11/28/2011    a) midRCA PCI 1999 (NIR BMS 3.0 mm x 32 mm); distal  RCA BMS Sept 2012 (Vision BMS 2.5 mm x 15 mm); 12/02: RCA ISR --> Cutting PTCA    Coronary stent restenosis due to progression of disease 11/30/2011   Dyslipidemia, goal LDL below 70 11/28/2011   Essential hypertension    On ACE inhibitor and ARB? As well  as atenolol and hydralazine   Gastric ulcer 11/2018   GERD (gastroesophageal reflux disease)    History of blood transfusion    without reaction   Moderate aortic stenosis by prior echocardiogram 08/2011; 08/2013   Echo 11/09/18: EF 60-65%. Gr 2 DD.  No RWMA.  Mild-Mod AS (mean gradient 19 mmHg). Mild-Mod PAH. 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 MAngelena Formand Dr. BCyndia Bent  Type 2 diabetes mellitus with complication, without long-term current use of insulin ([place]    no longer taking diabetic medication - diet controlled   Past Surgical History:  Procedure Laterality Date   ABDOMINAL HYSTERECTOMY     BIOPSY  06/03/2018   Procedure: BIOPSY;  Surgeon: RRogene Houston MD;  [facility];  Service: Endoscopy;;  gastric    BIOPSY  09/15/2022   Procedure: BIOPSY;  Surgeon: CEloise Harman DO;  [facility];  Service: Endoscopy;;   BREAST BIOPSY     CARDIAC CATHETERIZATION  2012   performed for angina  CARDIAC CATHETERIZATION     CARDIOVERSION N/A 10/24/2020   Procedure: CARDIOVERSION;  Surgeon: Jerline Pain, MD;  Location: [facility] ENDOSCOPY;  Service: Cardiovascular;  Laterality: N/A;   CORONARY ANGIOPLASTY  11/2006   Cutting Balloon of stent in the RCA   ESOPHAGOGASTRODUODENOSCOPY N/A 06/03/2018   Procedure: ESOPHAGOGASTRODUODENOSCOPY (EGD);  Surgeon: Rogene Houston, MD;  [facility];  Service: Endoscopy;  Laterality: N/A;  1:40   ESOPHAGOGASTRODUODENOSCOPY N/A 12/02/2018   Procedure: ESOPHAGOGASTRODUODENOSCOPY (EGD);  Surgeon: Rogene Houston, MD;  [facility];  Service: Endoscopy;  Laterality: N/A;  255   ESOPHAGOGASTRODUODENOSCOPY (EGD) WITH PROPOFOL N/A 09/15/2022   Procedure: ESOPHAGOGASTRODUODENOSCOPY (EGD) WITH PROPOFOL;  Surgeon: Eloise Harman, DO;  [facility];  Service: Endoscopy;  Laterality: N/A;  1:00pm, asa 3   LEFT HEART CATHETERIZATION WITH CORONARY ANGIOGRAM N/A 11/27/2011   Procedure: LEFT HEART CATHETERIZATION WITH CORONARY ANGIOGRAM;  Surgeon: Leonie Man, MD;  Location: [facility] CATH LAB;  Service: Cardiovascular;  Laterality: N/A;   PERCUTANEOUS CORONARY STENT INTERVENTION (PCI-S)  1999   Mid RCA - NIR DMS 3.0 mm x 30 mm, dRCA Vision BMS 2.5 mm x 15 mm    RIGHT HEART CATH AND CORONARY ANGIOGRAPHY N/A 11/24/2022   Procedure: RIGHT HEART CATH AND CORONARY ANGIOGRAPHY;  Surgeon: Leonie Man, MD;  Location: [facility] CV LAB;  Service: Cardiovascular;  Laterality: N/A;   TEE WITHOUT CARDIOVERSION N/A 01/27/2023   Procedure: TRANSESOPHAGEAL ECHOCARDIOGRAM;  Surgeon: Burnell Blanks, MD;  Location: [place];  Service: Open Heart Surgery;  Laterality: N/A;   TRANSTHORACIC ECHOCARDIOGRAM  10/2018   EF 60-65%. Gr 2 DD.  No RWMA.  Mild-Mod AS  (mean gradient 19 mmHg). Mild-Mod PAH.   TRANSTHORACIC ECHOCARDIOGRAM  03/01/2020   EF 70 to 75%.  No or WMA.  Mild LVH.  GRII DD with severe LA enlargement.  Mildly elevated PAP.  Mild MR.  Moderate AS (mean gradient 17 mmHg) with dimensionless index of 0.32.   TUBAL LIGATION       A IV Location/Drains/Wounds Patient Lines/Drains/Airways Status     Active Line/Drains/Airways     Name Placement date Placement time Site Days   Peripheral IV 02/03/23 20 G 1" Anterior;Left Forearm 02/03/23  1142  Forearm  less than 1            Intake/Output Last 24 hours No intake or output data in the 24 hours ending 02/03/23 1654  Labs/Imaging Results for orders placed or performed during the hospital encounter of 02/03/23 (from the past 48 hour(s))  CBC with Differential      Status: Abnormal   Collection Time: 02/03/23 11:20 AM  Result Value Ref Range   WBC 6.6 4.0 - 10.5 K/uL   RBC 2.98 (L) 3.87 - 5.11 MIL/uL   Hemoglobin 8.9 (L) 12.0 - 15.0 g/dL   HCT 27.7 (L) 36.0 - 46.0 %   MCV 93.0 80.0 - 100.0 fL   MCH 29.9 26.0 - 34.0 pg   MCHC 32.1 30.0 - 36.0 g/dL   RDW 17.1 (H) 11.5 - 15.5 %   Platelets 165 150 - 400 K/uL   nRBC 0.0 0.0 - 0.2 %   Neutrophils Relative % 77 %   Neutro Abs 5.2 1.7 - 7.7 K/uL   Lymphocytes Relative 9 %   Lymphs Abs 0.6 (L) 0.7 - 4.0 K/uL   Monocytes Relative 9 %   Monocytes Absolute 0.6 0.1 - 1.0 K/uL   Eosinophils Relative 3 %  Eosinophils Absolute 0.2 0.0 - 0.5 K/uL   Basophils Relative 1 %   Basophils Absolute 0.0 0.0 - 0.1 K/uL   Immature Granulocytes 1 %   Abs Immature Granulocytes 0.03 0.00 - 0.07 K/uL    Comment: Performed at [facility], [place] [address] [place], [place] [address]  Protime-INR     Status: Abnormal   Collection Time: 02/03/23 11:52 AM  Result Value Ref Range   Prothrombin Time 20.4 (H) 11.4 - 15.2 seconds   INR 1.8 (H) 0.8 - 1.2    Comment: (NOTE) INR goal varies based on device and disease states. Performed at [facility], [place] [address] [place], [place] [address]   I-stat chem 8, ED (not at [facility], [facility] or [facility])     Status: Abnormal   Collection Time: 02/03/23 12:01 PM  Result Value Ref Range   Sodium 138 135 - 145 mmol/L   Potassium 5.0 3.5 - 5.1 mmol/L   Chloride 103 98 - 111 mmol/L   BUN 14 8 - 23 mg/dL   Creatinine, Ser 0.60 0.44 - 1.00 mg/dL   Glucose, Bld 122 (H) 70 - 99 mg/dL    Comment: Glucose reference range applies only to samples taken after fasting for at least 8 hours.   Calcium, Ion 0.99 (L) 1.15 - 1.40 mmol/L   TCO2 26 22 - 32 mmol/L   Hemoglobin 9.5 (L) 12.0 - 15.0 g/dL   HCT 28.0 (L) 36.0 - 46.0 %  Comprehensive metabolic panel     Status: Abnormal   Collection Time: 02/03/23  1:20 PM  Result Value Ref Range   Sodium 136 135 - 145 mmol/L   Potassium 3.4 (L)  3.5 - 5.1 mmol/L   Chloride 102 98 - 111 mmol/L   CO2 24 22 - 32 mmol/L   Glucose, Bld 121 (H) 70 - 99 mg/dL    Comment: Glucose reference range applies only to samples taken after fasting for at least 8 hours.   BUN 11 8 - 23 mg/dL   Creatinine, Ser 0.68 0.44 - 1.00 mg/dL   Calcium 8.5 (L) 8.9 - 10.3 mg/dL   Total Protein 6.5 6.5 - 8.1 g/dL   Albumin 3.5 3.5 - 5.0 g/dL   AST 17 15 - 41 U/L   ALT 8 0 - 44 U/L   Alkaline Phosphatase 53 38 - 126 U/L   Total Bilirubin 1.4 (H) 0.3 - 1.2 mg/dL   GFR, Estimated >60 >60 mL/min    Comment: (NOTE) Calculated using the CKD-EPI Creatinine Equation (2021)    Anion gap 10 5 - 15    Comment: Performed at [facility], [place] [address] [place], [place] [address]  Lipase, blood     Status: None   Collection Time: 02/03/23  1:20 PM  Result Value Ref Range   Lipase 39 11 - 51 U/L    Comment: Performed at [facility] [address], [place], [place] [address]  Magnesium     Status: Abnormal   Collection Time: 02/03/23  1:20 PM  Result Value Ref Range   Magnesium 1.5 (L) 1.7 - 2.4 mg/dL    Comment: Performed at [facility] [address] [place], [place] [address]  Troponin I (High Sensitivity)     Status: Abnormal   Collection Time: 02/03/23  1:20 PM  Result Value Ref Range   Troponin I (High Sensitivity) 367 (HH) <18 ng/L    Comment: CRITICAL RESULT CALLED  TO, READ BACK BY AND VERIFIED WITH Leodis Liverpool, RN @ [phone] 02/03/23 BY SEKDAHL (NOTE) Elevated high sensitivity troponin I (hsTnI) values and significant  changes across serial measurements may suggest ACS but many other  chronic and acute conditions are known to elevate hsTnI results.  Refer to the "Links" section for chest pain algorithms and additional  guidance. Performed at [facility], [place] [address], [place], [place] [address]   Urinalysis, Routine w reflex microscopic -Urine, Clean Catch     Status: Abnormal   Collection Time: 02/03/23  2:36 PM   Result Value Ref Range   Color, Urine YELLOW YELLOW   APPearance CLEAR CLEAR   Specific Gravity, Urine >1.046 (H) 1.005 - 1.030   pH 7.0 5.0 - 8.0   Glucose, UA NEGATIVE NEGATIVE mg/dL   Hgb urine dipstick NEGATIVE NEGATIVE   Bilirubin Urine NEGATIVE NEGATIVE   Ketones, ur NEGATIVE NEGATIVE mg/dL   Protein, ur NEGATIVE NEGATIVE mg/dL   Nitrite NEGATIVE NEGATIVE   Leukocytes,Ua NEGATIVE NEGATIVE    Comment: Performed at [facility] [address] [place], [place] [address]   CT Angio Chest/Abd/Pel for Dissection W and/or Wo Contrast  Result Date: 02/03/2023 CLINICAL DATA:  Recent TAVR a shin with chest pain. CT scan 12/03/2022 EXAM: CT ANGIOGRAPHY CHEST, ABDOMEN AND PELVIS TECHNIQUE: Non-contrast CT of the chest was initially obtained. Multidetector CT imaging through the chest, abdomen and pelvis was performed using the standard protocol during bolus administration of intravenous contrast. Multiplanar reconstructed images and MIPs were obtained and reviewed to evaluate the vascular anatomy. RADIATION DOSE REDUCTION: This exam was performed according to the departmental dose-optimization program which includes automated exposure control, adjustment of the mA and/or kV according to patient size and/or use of iterative reconstruction technique. CONTRAST:  80m OMNIPAQUE IOHEXOL 350 MG/ML SOLN COMPARISON:  CT scan 12/03/2022 FINDINGS: CTA CHEST FINDINGS Cardiovascular: The heart is borderline enlarged but stable. No pericardial effusion. Surgical changes from recent TAVR. No complicating features are identified. No aortic aneurysm or dissection. Stable atherosclerotic calcifications mainly involving the aortic arch and descending thoracic aorta. Stable three-vessel coronary artery calcifications. The pulmonary arteries are unremarkable. No findings suspicious for pulmonary embolism. Significant reflux of contrast down the IVC and into the hepatic veins could be due to tricuspid regurgitation.  Mediastinum/Nodes: No mediastinal or hilar mass or lymphadenopathy the. Moderate-sized hiatal hernia again noted. Lungs/Pleura: Moderate-sized bilateral pleural effusions, right larger than left with overlying atelectasis. No pulmonary edema. No pneumothorax. Musculoskeletal: No breast masses, supraclavicular or axillary adenopathy. Mild diffuse body wall edema noted. The bony thorax is intact. No bone lesions or fractures. Review of the MIP images confirms the above findings. CTA ABDOMEN AND PELVIS FINDINGS VASCULAR Aorta: Normal caliber. No dissection. Advanced atherosclerotic calcification. Celiac: Moderate ostial calcifications but no significant stenosis. SMA: Ostial calcifications but no stenosis or aneurysm. Renals: Bilateral renal artery calcifications with moderate stenosis bilaterally. Second smaller left renal artery is unremarkable. IMA: Patent Inflow: Advanced atherosclerotic calcifications but no significant stenosis, aneurysm or dissection. Veins: Grossly normal. Review of the MIP images confirms the above findings. NON-VASCULAR Hepatobiliary: No hepatic lesions or intrahepatic biliary dilatation. The gallbladder is grossly normal. Suspect changes of hepatic congestion given the amount of reflux of contrast. The gallbladder is grossly normal. No common bile duct dilatation. Pancreas: No mass, inflammation ductal dilatation. Spleen: Normal size.  No focal lesions. Adrenals/Urinary Tract: The adrenal glands and kidneys are grossly normal. The bladder is unremarkable. Stomach/Bowel: The stomach, duodenum, small bowel  and colon are grossly normal. No obvious obstructive findings. Lymphatic: No abdominal or pelvic lymphadenopathy. Reproductive: Surgically absent. Other: There is a large extraperitoneal hematoma on the right side extending from the pelvis poly up to the liver. It measures approximately 20 x 13 x 6.5 cm. No large inguinal hematoma. There is also a moderate amount of simple appearing fluid  surrounding the liver and spleen and also in the pelvis. Diffuse body wall edema is also noted. Musculoskeletal: No significant bony findings. Review of the MIP images confirms the above findings. IMPRESSION: 1. Surgical changes from recent TAVR. No complicating features are identified. 2. Large extraperitoneal abdominal/pelvic hematoma on the right side. It measures approximately 20 x 13 x 6.5 cm. 3. Moderate-sized bilateral pleural effusions, right larger than left with overlying atelectasis. 4. Advanced vascular disease but no aneurysm, dissection or significant stenosis. 5. Suspect changes of hepatic congestion given the amount of reflux of contrast down the IVC and into the hepatic veins. 6. Moderate amount of free abdominal/pelvic fluid. 7. Diffuse body wall edema. These results will be called to the ordering clinician or representative by the Radiologist Assistant, and communication documented in the PACS or [employer]. Electronically Signed   By: Marijo Sanes M.D.   On: 02/03/2023 13:18   DG Chest Port 1 View  Result Date: 02/03/2023 CLINICAL DATA:  cp, recent tavr, abdominal pain since last night EXAM: PORTABLE CHEST 1 VIEW COMPARISON:  01/23/2023 chest radiograph. FINDINGS: Aortic valve prosthesis in place. Surgical clips overlie left axilla. Stable cardiomediastinal silhouette with mild to moderate cardiomegaly. No pneumothorax. Stable small right pleural effusion. No left pleural effusion. No overt pulmonary edema. Streaky hazy bibasilar lung opacities, favor atelectasis. IMPRESSION: 1. Stable mild to moderate cardiomegaly without overt pulmonary edema. 2. Stable small right pleural effusion. 3. Streaky hazy bibasilar lung opacities, favor atelectasis. Electronically Signed   By: Ilona Sorrel M.D.   On: 02/03/2023 11:13    Pending Labs Unresulted Labs (From admission, onward)     Start     Ordered   02/03/23 1626  Brain natriuretic peptide  Once,   R        02/03/23 1625             Vitals/Pain Today's Vitals   02/03/23 1534 02/03/23 1534 02/03/23 1600 02/03/23 1630  BP:   (!) 154/77 (!) 148/111  Pulse:   89 97  Resp:   15 (!) 31  Temp: 98.4 F (36.9 C)     TempSrc: Oral     SpO2:   96% 98%  Weight:      Height:      PainSc:  0-No pain      Isolation Precautions No active isolations  Medications Medications  magnesium sulfate IVPB 2 g 50 mL (has no administration in time range)  calcium gluconate 1 g/ 50 mL sodium chloride IVPB (has no administration in time range)  potassium chloride SA (KLOR-CON M) CR tablet 40 mEq (has no administration in time range)  ondansetron (ZOFRAN) injection 4 mg (4 mg Intravenous Given 02/03/23 1043)  iohexol (OMNIPAQUE) 350 MG/ML injection 75 mL (75 mLs Intravenous Contrast Given 02/03/23 1258)  HYDROmorphone (DILAUDID) injection 0.5 mg (0.5 mg Intravenous Given 02/03/23 1320)  prothrombin complex conc human (KCENTRA) IVPB 3,261 Units (0 Units Intravenous Stopped 02/03/23 1439)    Mobility walks with device (uses walker in her usual states but limited today)     Focused Assessments Vascular   R Recommendations: See Admitting Provider Note  Report given to:   Additional Notes: Patient will be sent to the floor when bed is marked ready/clean. In the main time, call me if you have a question.

## 2023-02-04 DIAGNOSIS — I97638 Postprocedural hematoma of a circulatory system organ or structure following other circulatory system procedure: Secondary | ICD-10-CM | POA: Diagnosis not present

## 2023-02-04 DIAGNOSIS — I4819 Other persistent atrial fibrillation: Secondary | ICD-10-CM | POA: Diagnosis not present

## 2023-02-04 LAB — CBC
HCT: 26.6 % — ABNORMAL LOW (ref 36.0–46.0)
Hemoglobin: 8.6 g/dL — ABNORMAL LOW (ref 12.0–15.0)
MCH: 29.6 pg (ref 26.0–34.0)
MCHC: 32.3 g/dL (ref 30.0–36.0)
MCV: 91.4 fL (ref 80.0–100.0)
Platelets: 161 10*3/uL (ref 150–400)
RBC: 2.91 MIL/uL — ABNORMAL LOW (ref 3.87–5.11)
RDW: 16.5 % — ABNORMAL HIGH (ref 11.5–15.5)
WBC: 8.2 10*3/uL (ref 4.0–10.5)
nRBC: 0 % (ref 0.0–0.2)

## 2023-02-04 LAB — MAGNESIUM: Magnesium: 2 mg/dL (ref 1.7–2.4)

## 2023-02-04 LAB — BASIC METABOLIC PANEL
Anion gap: 12 (ref 5–15)
BUN: 11 mg/dL (ref 8–23)
CO2: 26 mmol/L (ref 22–32)
Calcium: 8.7 mg/dL — ABNORMAL LOW (ref 8.9–10.3)
Chloride: 99 mmol/L (ref 98–111)
Creatinine, Ser: 0.78 mg/dL (ref 0.44–1.00)
GFR, Estimated: >60 mL/min (ref >60)
Glucose, Bld: 112 mg/dL — ABNORMAL HIGH (ref 70–99)
Potassium: 4 mmol/L (ref 3.5–5.1)
Sodium: 137 mmol/L (ref 135–145)

## 2023-02-04 LAB — HEMOGLOBIN AND HEMATOCRIT, BLOOD
HCT: 25.8 % — ABNORMAL LOW (ref 36.0–46.0)
Hemoglobin: 8.4 g/dL — ABNORMAL LOW (ref 12.0–15.0)

## 2023-02-04 MED ORDER — FUROSEMIDE 10 MG/ML IJ SOLN
40.0000 mg | Freq: Once | INTRAMUSCULAR | Status: AC
  Administered 2023-02-04: 40 mg via INTRAVENOUS
  Filled 2023-02-04: qty 4

## 2023-02-04 MED FILL — Potassium Chloride Inj 2 mEq/ML: INTRAVENOUS | Qty: 40 | Status: AC

## 2023-02-04 MED FILL — Heparin Sodium (Porcine) Inj 1000 Unit/ML: Qty: 1000 | Status: AC

## 2023-02-04 MED FILL — Magnesium Sulfate Inj 50%: INTRAMUSCULAR | Qty: 10 | Status: AC

## 2023-02-04 NOTE — Progress Notes (Addendum)
[place] VALVE TEAM  Patient Name: [name] Date of Encounter: 02/04/2023  Primary Cardiologist: Glenetta Hew, MD  / Dr. Angelena Form, MD and Dr. Cyndia Bent, MD (TAVR)    [facility] Problem List     Principal Problem:   Hematoma of extraperitoneal space Active Problems:   Essential hypertension   Hyperlipidemia   Severe calcific aortic valve stenosis   CAD S/P percutaneous coronary angioplasty   Persistent atrial fibrillation (HCC)   Diet-controlled diabetes mellitus (HCC)   S/P TAVR (transcatheter aortic valve replacement)   Hypomagnesemia   Elevated troponin   Hypokalemia   Hypocalcemia   Postoperative anemia   Subjective   Patient feeling better today with minimal pain except with palpation of the right lower abdomen. No SOB, chest pain. Hb stable.   Inpatient Medications    Scheduled Meds:  diphenhydrAMINE  50 mg Oral QHS   And   acetaminophen  1,000 mg Oral QHS   atorvastatin  20 mg Oral QPM   carvedilol  25 mg Oral BID   hydrALAZINE  100 mg Oral BID   pantoprazole  40 mg Oral BID AC   sodium chloride flush  3 mL Intravenous Q12H   Continuous Infusions:  PRN Meds: acetaminophen **OR** acetaminophen, albuterol, furosemide, HYDROcodone-acetaminophen, HYDROmorphone (DILAUDID) injection, loperamide, ondansetron **OR** ondansetron (ZOFRAN) IV   Vital Signs    Vitals:   02/03/23 2344 02/04/23 0353 02/04/23 0649 02/04/23 0823  BP: (!) 143/99     Pulse: 87 81 83   Resp: 17 20 19   $ Temp: 98.4 F (36.9 C) 98.6 F (37 C)  97.8 F (36.6 C)  TempSrc: Oral Oral  Oral  SpO2: 95% 97% 94%   Weight:   60.1 kg   Height:        Intake/Output Summary (Last 24 hours) at 02/04/2023 0935 Last data filed at 02/04/2023 0651 Gross per 24 hour  Intake 45.21 ml  Output 250 ml  Net -204.79 ml   Filed Weights   02/03/23 1018 02/03/23 2005 02/04/23 0649  Weight: 63.5 kg 60.1 kg 60.1 kg   Physical Exam   General: Well  developed, well nourished, NAD Lungs:Clear to ausculation bilaterally. No wheezes, rales, or rhonchi. Breathing is unlabored. Cardiovascular: RRR with S1 S2. No murmurs Abdomen: Soft, mild tenderness in the right lower region. No obvious abdominal masses. Extremities: No edema.  Neuro: Alert and oriented. No focal deficits. No facial asymmetry. MAE spontaneously. Psych: Responds to questions appropriately with normal affect.    Labs    CBC Recent Labs    02/03/23 1120 02/03/23 1201 02/03/23 1837 02/04/23 0446  WBC 6.6  --   --  8.2  NEUTROABS 5.2  --   --   --   HGB 8.9*   < > 8.6* 8.6*  HCT 27.7*   < > 27.5* 26.6*  MCV 93.0  --   --  91.4  PLT 165  --   --  161   < > = values in this interval not displayed.   Basic Metabolic Panel Recent Labs    02/03/23 1320 02/04/23 0446  NA 136 137  K 3.4* 4.0  CL 102 99  CO2 24 26  GLUCOSE 121* 112*  BUN 11 11  CREATININE 0.68 0.78  CALCIUM 8.5* 8.7*  MG 1.5* 2.0   Liver Function Tests Recent Labs    02/03/23 1320  AST 17  ALT 8  ALKPHOS 53  BILITOT 1.4*  PROT 6.5  ALBUMIN 3.5   Recent Labs    02/03/23 1320  LIPASE 39   Cardiac Enzymes No results for input(s): "CKTOTAL", "CKMB", "CKMBINDEX", "TROPONINI" in the last 72 hours. BNP Invalid input(s): "POCBNP" D-Dimer No results for input(s): "DDIMER" in the last 72 hours. Hemoglobin A1C No results for input(s): "HGBA1C" in the last 72 hours. Fasting Lipid Panel No results for input(s): "CHOL", "HDL", "LDLCALC", "TRIG", "CHOLHDL", "LDLDIRECT" in the last 72 hours. Thyroid Function Tests No results for input(s): "TSH", "T4TOTAL", "T3FREE", "THYROIDAB" in the last 72 hours.  Invalid input(s): "FREET3"  Telemetry    AF rate controlled - Personally Reviewed  ECG    No new tracing as of 02/04/23 - Personally Reviewed  Radiology    CT Angio Chest/Abd/Pel for Dissection W and/or Wo Contrast  Result Date: 02/03/2023 CLINICAL DATA:  Recent TAVR a shin with  chest pain. CT scan 12/03/2022 EXAM: CT ANGIOGRAPHY CHEST, ABDOMEN AND PELVIS TECHNIQUE: Non-contrast CT of the chest was initially obtained. Multidetector CT imaging through the chest, abdomen and pelvis was performed using the standard protocol during bolus administration of intravenous contrast. Multiplanar reconstructed images and MIPs were obtained and reviewed to evaluate the vascular anatomy. RADIATION DOSE REDUCTION: This exam was performed according to the departmental dose-optimization program which includes automated exposure control, adjustment of the mA and/or kV according to patient size and/or use of iterative reconstruction technique. CONTRAST:  58m OMNIPAQUE IOHEXOL 350 MG/ML SOLN COMPARISON:  CT scan 12/03/2022 FINDINGS: CTA CHEST FINDINGS Cardiovascular: The heart is borderline enlarged but stable. No pericardial effusion. Surgical changes from recent TAVR. No complicating features are identified. No aortic aneurysm or dissection. Stable atherosclerotic calcifications mainly involving the aortic arch and descending thoracic aorta. Stable three-vessel coronary artery calcifications. The pulmonary arteries are unremarkable. No findings suspicious for pulmonary embolism. Significant reflux of contrast down the IVC and into the hepatic veins could be due to tricuspid regurgitation. Mediastinum/Nodes: No mediastinal or hilar mass or lymphadenopathy the. Moderate-sized hiatal hernia again noted. Lungs/Pleura: Moderate-sized bilateral pleural effusions, right larger than left with overlying atelectasis. No pulmonary edema. No pneumothorax. Musculoskeletal: No breast masses, supraclavicular or axillary adenopathy. Mild diffuse body wall edema noted. The bony thorax is intact. No bone lesions or fractures. Review of the MIP images confirms the above findings. CTA ABDOMEN AND PELVIS FINDINGS VASCULAR Aorta: Normal caliber. No dissection. Advanced atherosclerotic calcification. Celiac: Moderate ostial  calcifications but no significant stenosis. SMA: Ostial calcifications but no stenosis or aneurysm. Renals: Bilateral renal artery calcifications with moderate stenosis bilaterally. Second smaller left renal artery is unremarkable. IMA: Patent Inflow: Advanced atherosclerotic calcifications but no significant stenosis, aneurysm or dissection. Veins: Grossly normal. Review of the MIP images confirms the above findings. NON-VASCULAR Hepatobiliary: No hepatic lesions or intrahepatic biliary dilatation. The gallbladder is grossly normal. Suspect changes of hepatic congestion given the amount of reflux of contrast. The gallbladder is grossly normal. No common bile duct dilatation. Pancreas: No mass, inflammation ductal dilatation. Spleen: Normal size.  No focal lesions. Adrenals/Urinary Tract: The adrenal glands and kidneys are grossly normal. The bladder is unremarkable. Stomach/Bowel: The stomach, duodenum, small bowel and colon are grossly normal. No obvious obstructive findings. Lymphatic: No abdominal or pelvic lymphadenopathy. Reproductive: Surgically absent. Other: There is a large extraperitoneal hematoma on the right side extending from the pelvis poly up to the liver. It measures approximately 20 x 13 x 6.5 cm. No large inguinal hematoma. There is also a moderate amount of simple appearing fluid surrounding the liver and  spleen and also in the pelvis. Diffuse body wall edema is also noted. Musculoskeletal: No significant bony findings. Review of the MIP images confirms the above findings. IMPRESSION: 1. Surgical changes from recent TAVR. No complicating features are identified. 2. Large extraperitoneal abdominal/pelvic hematoma on the right side. It measures approximately 20 x 13 x 6.5 cm. 3. Moderate-sized bilateral pleural effusions, right larger than left with overlying atelectasis. 4. Advanced vascular disease but no aneurysm, dissection or significant stenosis. 5. Suspect changes of hepatic congestion  given the amount of reflux of contrast down the IVC and into the hepatic veins. 6. Moderate amount of free abdominal/pelvic fluid. 7. Diffuse body wall edema. These results will be called to the ordering clinician or representative by the Radiologist Assistant, and communication documented in the PACS or [employer]. Electronically Signed   By: Marijo Sanes M.D.   On: 02/03/2023 13:18   DG Chest Port 1 View  Result Date: 02/03/2023 CLINICAL DATA:  cp, recent tavr, abdominal pain since last night EXAM: PORTABLE CHEST 1 VIEW COMPARISON:  01/23/2023 chest radiograph. FINDINGS: Aortic valve prosthesis in place. Surgical clips overlie left axilla. Stable cardiomediastinal silhouette with mild to moderate cardiomegaly. No pneumothorax. Stable small right pleural effusion. No left pleural effusion. No overt pulmonary edema. Streaky hazy bibasilar lung opacities, favor atelectasis. IMPRESSION: 1. Stable mild to moderate cardiomegaly without overt pulmonary edema. 2. Stable small right pleural effusion. 3. Streaky hazy bibasilar lung opacities, favor atelectasis. Electronically Signed   By: Ilona Sorrel M.D.   On: 02/03/2023 11:13    Cardiac Studies   [facility]  TAVR OPERATIVE NOTE     Date of Procedure:                01/27/2023   Preoperative Diagnosis:      Severe Aortic Stenosis    Postoperative Diagnosis:    Same    Procedure:        Transcatheter Aortic Valve Replacement - TransSubclavian Approach             Edwards Sapien 3 THV (size 23 mm, model # Q151231, serial # [id number] )              Co-Surgeons:                        Lauree Chandler, MD and Gilford Raid , MD    Anesthesiologist:                  Tobias Alexander   Echocardiographer:              Croitoru   Pre-operative Echo Findings: Severe aortic stenosis Normal left ventricular systolic function   Post-operative Echo Findings: No paravalvular leak Normal left ventricular systolic function    _____________   Echo 01/28/23:    1. Left ventricular ejection fraction, by estimation, is 65 to 70%. The  left ventricle has normal function. The left ventricle has no regional  wall motion abnormalities. There is mild concentric left ventricular  hypertrophy. Left ventricular diastolic  function could not be evaluated. There is the interventricular septum is  flattened in diastole ('D' shaped left ventricle), consistent with right  ventricular volume overload.   2. Right ventricular systolic function is mildly reduced. The right  ventricular size is moderately enlarged. There is mildly elevated  pulmonary artery systolic pressure. The estimated right ventricular  systolic pressure is 0000000 mmHg.   3. Left atrial size was severely dilated.  4. Right atrial size was severely dilated.   5. The mitral valve is normal in structure. Mild mitral valve  regurgitation.   6. The tricuspid valve is abnormal. Tricuspid valve regurgitation is  severe.   7. The aortic valve has been repaired/replaced. Aortic valve  regurgitation is not visualized. There is a 23 mm Sapien prosthetic (TAVR)  valve present in the aortic position. Procedure Date: 01/27/2023. Echo  findings are consistent with normal structure  and function of the aortic valve prosthesis. Aortic valve mean gradient  measures 6.0 mmHg. Aortic valve Vmax measures 1.69 m/s. Aortic valve  acceleration time measures 53 msec.   8. The inferior vena cava is dilated in size with <50% respiratory  variability, suggesting right atrial pressure of 15 mmHg.   Comparison(s): Prior images reviewed side by side. Tricuspid insufficiency  is worse.   Patient Profile     [name] is a 87 y.o. female with a hx of persistent atrial fibrillation, CAD with prior PCI, chronic diastolic CHF, HTN, HLD, DM diet controlled and severe aortic stenosis who recently underwent TAVR 01/27/23 and was discharged 01/29/23.    Assessment & Plan    Large  peritoneal hematoma: Pt recently underwent TAVR 2/6 and discharged 2/8. Pt called today with severe right sided abdominal pain. CT imaging showed a large extraperitoneal hematoma measuring 20 x 13 x 6.5 cm. She was treated with pain medication and [facility] was consulted. Hb remains stable at 9.5>>8.6>>8.6. Pain improved today. Plan to hold Eliquis at least 1 week possibly 2 weeks depending on her clinical progress.  She has follow up with [facility] team next week. Appreciate [facility]VS following along.    CAD s/p PCI: Pre TAVR R/LHC with patent pLAD and pRCA stents with no other occlusive disease noted. HsT on ED arrival noted to be elevated at 367. Denies anginal pain. EKG with lateral changes however noted on prior EKG tracings.    Severe AS: s/p successful TAVR with a 23 mm Edwards Sapien 3 THV via the [place] approach on 01/27/23. Post operative echo showed stable valve function with a mean gradient at 82mHg and AVA at 2.92cm2. Continue holding Eliquis as above.  Post op anemia: Post TAVR CBC returned with a Hb at 6.3 however no other overt s/s of acute bleeding. Patient received 2u PRBCs with improvement in Hb to 8.7.  Felt to be dilutional per Dr. BCyndia Bent Hb trending 9.5>>8.6>>8.6.    Persistent atrial fibrillation: Eliquis held post TAVR until 2/8 due to anemia. Continue holding x 1-2 weeks.    Chronic diastolic CHF: Appears euvolic on exam however BNP elevated. Will given one dose Lasix today then can resume PRN dosing at discharge.    HTN: Stable   DM (diet controlled): SSI while inpatient.    Signed, JKathyrn Drown NP  02/04/2023, 9:35 AM  Pager [phone]  Patient seen, examined. Available data reviewed. Agree with findings, assessment, and plan as outlined by JKathyrn Drown NP.  The patient is alert, oriented, elderly woman in no distress.  HEENT is normal, JVP is normal, lungs are clear bilaterally, heart is regular rate and rhythm with a soft ejection murmur at the right upper sternal border, abdomen exam is  unchanged from yesterday with tenderness over the right abdomen, extremities have no edema.  Hemoglobin is stable as outlined above.  We will keep her off of oral anticoagulation while she heals from her extraperitoneal hematoma.  Appreciate vascular surgery evaluation.  Otherwise appears clinically stable today.  She  had a lot of pain with standing up and will need another 24 hours in the hospital for pain management and medical stabilization.  Sherren Mocha, M.D. 02/04/2023 1:02 PM

## 2023-02-04 NOTE — Progress Notes (Signed)
Mobility Specialist Progress Note:   02/04/23 1035  Mobility  Activity Stood at bedside  Level of Assistance Standby assist, set-up cues, supervision of patient - no hands on  Assistive Device Front wheel walker  Activity Response Tolerated fair  Mobility Referral Yes  $Mobility charge 1 Mobility   Pt received in bed and agreeable. C/o pain in right side throughout. Stood 3 times for ~15mn each time. Standing tolerance limited d/t increased pain with standing. Pt returned to bed with all needs met, call bell in reach, and family in room.   KAndrey CampanileMobility Specialist Please contact via SecureChat or  Rehab office at [phone]

## 2023-02-04 NOTE — Progress Notes (Signed)
PROGRESS NOTE    [name]  [id number] DOB: 17-Oct-1936 DOA: 02/03/2023 PCP: Baruch Gouty, FNP    Brief Narrative:  87 year old with history of hypertension, hyperlipidemia, coronary artery disease, persistent A-fib on chronic anticoagulation, severe aortic valve stenosis who underwent TAVR on 2/6 presented with right abdominal and flank pain.  In the emergency department noted to be afebrile with elevated blood pressures.  Hemoglobin 8.9.  Electrolytes normal.  Magnesium 1.5.  CT scan of the chest abdomen pelvis showed 20 x 13 cm extraperitoneal hematoma on the right side.  Eppie Gibson was given.  Admitted to the hospital with cardiology and vascular surgery consultation.   Assessment & Plan:   Large spontaneous extraperitoneal hematoma: TAVR 2/6 Eliquis last dose 2/30 morning, received Kcentra for reversal of Eliquis. Right extrapleural hematoma 20 x 13 cm in size, currently stable. Hemoglobin remains stable so far.  Significant abdominal pain. Check globin every 12 hours, recheck in the evening.  Pain medications with IV and oral opiates. Seen by vascular surgery, recommended conservative management. Followed by cardiology, off anticoagulation for now. Anticipate home without anticoagulation.  Severe aortic stenosis status post TAVR: Outpatient follow-up.  Persistent A-fib: Continue Coreg.  Holding Eliquis as above.  Electrolytes including hypocalcemia, hypokalemia and hypomagnesemia: Replaced and adequate.  Essential hypertension: Blood pressure stable.  Hyperlipidemia: On statin.   DVT prophylaxis: SCDs Start: 02/03/23 1709   Code Status: Full code Family Communication: Daughter at bedside Disposition Plan: Status is: Observation The patient will require care spanning > 2 midnights and should be moved to inpatient because: Significant abdominal pain, hematoma monitoring,     Consultants:  Cardiology Vascular surgery  Procedures:  None  Antimicrobials:   None   Subjective: Patient seen and examined.  While she was resting she complained of mild right groin pain.  When she mobilized around she had more pain on the right flank and groin.  Denies any nausea vomiting.  Urinating normally.  Daughter at the bedside.  Objective: Vitals:   02/03/23 2344 02/04/23 0353 02/04/23 0649 02/04/23 0823  BP: (!) 143/99   (!) 144/67  Pulse: 87 81 83 79  Resp: 17 20 19 13  $ Temp: 98.4 F (36.9 C) 98.6 F (37 C)  97.8 F (36.6 C)  TempSrc: Oral Oral  Oral  SpO2: 95% 97% 94% 97%  Weight:   60.1 kg   Height:        Intake/Output Summary (Last 24 hours) at 02/04/2023 1342 Last data filed at 02/04/2023 0651 Gross per 24 hour  Intake 45.21 ml  Output 250 ml  Net -204.79 ml   Filed Weights   02/03/23 1018 02/03/23 2005 02/04/23 0649  Weight: 63.5 kg 60.1 kg 60.1 kg    Examination:  General exam: Appears calm and comfortable, not in any distress.  Anxious. Respiratory system: Clear to auscultation. Respiratory effort normal. Cardiovascular system: S1 & S2 heard, RRR.  Soft systolic murmur at the apex. Gastrointestinal system: Abdomen is nondistended, soft and nontender. No organomegaly or masses felt. Normal bowel sounds heard. No palpable tenderness or induration. Central nervous system: Alert and oriented. No focal neurological deficits. Extremities: Symmetric 5 x 5 power. Skin: No rashes, lesions or ulcers Psychiatry: Judgement and insight appear normal. Mood & affect appropriate.     Data Reviewed: I have personally reviewed following labs and imaging studies  CBC: Recent Labs  Lab 01/29/23 0059 02/03/23 1120 02/03/23 1201 02/03/23 1837 02/04/23 0446  WBC 7.2 6.6  --   --  8.2  NEUTROABS  --  5.2  --   --   --   HGB 8.7* 8.9* 9.5* 8.6* 8.6*  HCT 26.2* 27.7* 28.0* 27.5* 26.6*  MCV 88.5 93.0  --   --  91.4  PLT 142* 165  --   --  [id number]   Basic Metabolic Panel: Recent Labs  Lab 01/29/23 0059 02/03/23 1201 02/03/23 1320  02/04/23 0446  NA 134* 138 136 137  K 3.3* 5.0 3.4* 4.0  CL 98 103 102 99  CO2 26  --  24 26  GLUCOSE 128* 122* 121* 112*  BUN 16 14 11 11  $ CREATININE 0.98 0.60 0.68 0.78  CALCIUM 8.2*  --  8.5* 8.7*  MG  --   --  1.5* 2.0   GFR: Estimated Creatinine Clearance: 46.3 mL/min (by C-G formula based on SCr of 0.78 mg/dL). Liver Function Tests: Recent Labs  Lab 02/03/23 1320  AST 17  ALT 8  ALKPHOS 53  BILITOT 1.4*  PROT 6.5  ALBUMIN 3.5   Recent Labs  Lab 02/03/23 1320  LIPASE 39   No results for input(s): "AMMONIA" in the last 168 hours. Coagulation Profile: Recent Labs  Lab 02/03/23 1152  INR 1.8*   Cardiac Enzymes: No results for input(s): "CKTOTAL", "CKMB", "CKMBINDEX", "TROPONINI" in the last 168 hours. BNP (last 3 results) No results for input(s): "PROBNP" in the last 8760 hours. HbA1C: No results for input(s): "HGBA1C" in the last 72 hours. CBG: No results for input(s): "GLUCAP" in the last 168 hours. Lipid Profile: No results for input(s): "CHOL", "HDL", "LDLCALC", "TRIG", "CHOLHDL", "LDLDIRECT" in the last 72 hours. Thyroid Function Tests: No results for input(s): "TSH", "T4TOTAL", "FREET4", "T3FREE", "THYROIDAB" in the last 72 hours. Anemia Panel: No results for input(s): "VITAMINB12", "FOLATE", "FERRITIN", "TIBC", "IRON", "RETICCTPCT" in the last 72 hours. Sepsis Labs: No results for input(s): "PROCALCITON", "LATICACIDVEN" in the last 168 hours.  No results found for this or any previous visit (from the past 240 hour(s)).       Radiology Studies: CT Angio Chest/Abd/Pel for Dissection W and/or Wo Contrast  Result Date: 02/03/2023 CLINICAL DATA:  Recent TAVR a shin with chest pain. CT scan 12/03/2022 EXAM: CT ANGIOGRAPHY CHEST, ABDOMEN AND PELVIS TECHNIQUE: Non-contrast CT of the chest was initially obtained. Multidetector CT imaging through the chest, abdomen and pelvis was performed using the standard protocol during bolus administration of  intravenous contrast. Multiplanar reconstructed images and MIPs were obtained and reviewed to evaluate the vascular anatomy. RADIATION DOSE REDUCTION: This exam was performed according to the departmental dose-optimization program which includes automated exposure control, adjustment of the mA and/or kV according to patient size and/or use of iterative reconstruction technique. CONTRAST:  26m OMNIPAQUE IOHEXOL 350 MG/ML SOLN COMPARISON:  CT scan 12/03/2022 FINDINGS: CTA CHEST FINDINGS Cardiovascular: The heart is borderline enlarged but stable. No pericardial effusion. Surgical changes from recent TAVR. No complicating features are identified. No aortic aneurysm or dissection. Stable atherosclerotic calcifications mainly involving the aortic arch and descending thoracic aorta. Stable three-vessel coronary artery calcifications. The pulmonary arteries are unremarkable. No findings suspicious for pulmonary embolism. Significant reflux of contrast down the IVC and into the hepatic veins could be due to tricuspid regurgitation. Mediastinum/Nodes: No mediastinal or hilar mass or lymphadenopathy the. Moderate-sized hiatal hernia again noted. Lungs/Pleura: Moderate-sized bilateral pleural effusions, right larger than left with overlying atelectasis. No pulmonary edema. No pneumothorax. Musculoskeletal: No breast masses, supraclavicular or axillary adenopathy. Mild diffuse body wall edema noted. The bony thorax is intact. No  bone lesions or fractures. Review of the MIP images confirms the above findings. CTA ABDOMEN AND PELVIS FINDINGS VASCULAR Aorta: Normal caliber. No dissection. Advanced atherosclerotic calcification. Celiac: Moderate ostial calcifications but no significant stenosis. SMA: Ostial calcifications but no stenosis or aneurysm. Renals: Bilateral renal artery calcifications with moderate stenosis bilaterally. Second smaller left renal artery is unremarkable. IMA: Patent Inflow: Advanced atherosclerotic  calcifications but no significant stenosis, aneurysm or dissection. Veins: Grossly normal. Review of the MIP images confirms the above findings. NON-VASCULAR Hepatobiliary: No hepatic lesions or intrahepatic biliary dilatation. The gallbladder is grossly normal. Suspect changes of hepatic congestion given the amount of reflux of contrast. The gallbladder is grossly normal. No common bile duct dilatation. Pancreas: No mass, inflammation ductal dilatation. Spleen: Normal size.  No focal lesions. Adrenals/Urinary Tract: The adrenal glands and kidneys are grossly normal. The bladder is unremarkable. Stomach/Bowel: The stomach, duodenum, small bowel and colon are grossly normal. No obvious obstructive findings. Lymphatic: No abdominal or pelvic lymphadenopathy. Reproductive: Surgically absent. Other: There is a large extraperitoneal hematoma on the right side extending from the pelvis poly up to the liver. It measures approximately 20 x 13 x 6.5 cm. No large inguinal hematoma. There is also a moderate amount of simple appearing fluid surrounding the liver and spleen and also in the pelvis. Diffuse body wall edema is also noted. Musculoskeletal: No significant bony findings. Review of the MIP images confirms the above findings. IMPRESSION: 1. Surgical changes from recent TAVR. No complicating features are identified. 2. Large extraperitoneal abdominal/pelvic hematoma on the right side. It measures approximately 20 x 13 x 6.5 cm. 3. Moderate-sized bilateral pleural effusions, right larger than left with overlying atelectasis. 4. Advanced vascular disease but no aneurysm, dissection or significant stenosis. 5. Suspect changes of hepatic congestion given the amount of reflux of contrast down the IVC and into the hepatic veins. 6. Moderate amount of free abdominal/pelvic fluid. 7. Diffuse body wall edema. These results will be called to the ordering clinician or representative by the Radiologist Assistant, and communication  documented in the PACS or [employer]. Electronically Signed   By: Marijo Sanes M.D.   On: 02/03/2023 13:18   DG Chest Port 1 View  Result Date: 02/03/2023 CLINICAL DATA:  cp, recent tavr, abdominal pain since last night EXAM: PORTABLE CHEST 1 VIEW COMPARISON:  01/23/2023 chest radiograph. FINDINGS: Aortic valve prosthesis in place. Surgical clips overlie left axilla. Stable cardiomediastinal silhouette with mild to moderate cardiomegaly. No pneumothorax. Stable small right pleural effusion. No left pleural effusion. No overt pulmonary edema. Streaky hazy bibasilar lung opacities, favor atelectasis. IMPRESSION: 1. Stable mild to moderate cardiomegaly without overt pulmonary edema. 2. Stable small right pleural effusion. 3. Streaky hazy bibasilar lung opacities, favor atelectasis. Electronically Signed   By: Ilona Sorrel M.D.   On: 02/03/2023 11:13        Scheduled Meds:  diphenhydrAMINE  50 mg Oral QHS   And   acetaminophen  1,000 mg Oral QHS   atorvastatin  20 mg Oral QPM   carvedilol  25 mg Oral BID   hydrALAZINE  100 mg Oral BID   pantoprazole  40 mg Oral BID AC   sodium chloride flush  3 mL Intravenous Q12H   Continuous Infusions:   LOS: 0 days    Time spent: 35 minutes    Barb Merino, MD [facility] Pager [phone]

## 2023-02-04 NOTE — Progress Notes (Signed)
  [facility]   MULTIDISCIPLINARY HEART VALVE TEAM   Post TAVR follow up appointment moved form 02/06/23 to 02/13/23 at [place] in [place] NP-C Structural Heart Team  Pager: [phone] Phone: [phone]

## 2023-02-04 NOTE — Progress Notes (Signed)
  Progress Note    02/04/2023 8:39 AM * No surgery found *  Subjective: Feeling better and slept well  Vitals:   02/04/23 0649 02/04/23 0823  BP:    Pulse: 83   Resp: 19   Temp:  97.8 F (36.6 C)  SpO2: 94%     Physical Exam: Awake alert oriented Nonlabored respirations Abdomen is soft Bilateral lower extremities are warm and well-perfused  CBC    Component Value Date/Time   WBC 8.2 02/04/2023 0446   RBC 2.91 (L) 02/04/2023 0446   HGB 8.6 (L) 02/04/2023 0446   HGB 9.9 (L) 11/12/2022 1549   HCT 26.6 (L) 02/04/2023 0446   HCT 30.9 (L) 11/12/2022 1549   PLT 161 02/04/2023 0446   PLT 211 11/12/2022 1549   MCV 91.4 02/04/2023 0446   MCV 86 11/12/2022 1549   MCH 29.6 02/04/2023 0446   MCHC 32.3 02/04/2023 0446   RDW 16.5 (H) 02/04/2023 0446   RDW 13.5 11/12/2022 1549   LYMPHSABS 0.6 (L) 02/03/2023 1120   LYMPHSABS 0.9 09/02/2022 1528   MONOABS 0.6 02/03/2023 1120   EOSABS 0.2 02/03/2023 1120   EOSABS 0.1 09/02/2022 1528   BASOSABS 0.0 02/03/2023 1120   BASOSABS 0.0 09/02/2022 1528    BMET    Component Value Date/Time   NA 137 02/04/2023 0446   NA 145 (H) 11/12/2022 1549   K 4.0 02/04/2023 0446   CL 99 02/04/2023 0446   CO2 26 02/04/2023 0446   GLUCOSE 112 (H) 02/04/2023 0446   BUN 11 02/04/2023 0446   BUN 19 11/12/2022 1549   CREATININE 0.78 02/04/2023 0446   CALCIUM 8.7 (L) 02/04/2023 0446   GFRNONAA >60 02/04/2023 0446   GFRAA >60 09/01/2020 1444    INR    Component Value Date/Time   INR 1.8 (H) 02/03/2023 1152     Intake/Output Summary (Last 24 hours) at 02/04/2023 0839 Last data filed at 02/04/2023 0350 Gross per 24 hour  Intake 45.21 ml  Output 250 ml  Net -204.79 ml     Assessment/plan:  87 y.o. female is s/p TAVR complicated by right retroperitoneal hematoma on anticoagulation.  Plan to hold anticoagulation and patient appears to be improving with stable H&H and hemodynamics.  Vascular surgery available as needed.    Sary Bogie C.  Donzetta Matters, MD [facility] Office: [phone] Pager: [phone]  02/04/2023 8:39 AM

## 2023-02-05 DIAGNOSIS — I97621 Postprocedural hematoma of a circulatory system organ or structure following other procedure: Secondary | ICD-10-CM | POA: Diagnosis not present

## 2023-02-05 DIAGNOSIS — R1031 Right lower quadrant pain: Secondary | ICD-10-CM | POA: Diagnosis present

## 2023-02-05 DIAGNOSIS — E785 Hyperlipidemia, unspecified: Secondary | ICD-10-CM | POA: Diagnosis not present

## 2023-02-05 DIAGNOSIS — Z953 Presence of xenogenic heart valve: Secondary | ICD-10-CM | POA: Diagnosis not present

## 2023-02-05 DIAGNOSIS — Z79899 Other long term (current) drug therapy: Secondary | ICD-10-CM | POA: Diagnosis not present

## 2023-02-05 DIAGNOSIS — I110 Hypertensive heart disease with heart failure: Secondary | ICD-10-CM | POA: Diagnosis not present

## 2023-02-05 DIAGNOSIS — Z809 Family history of malignant neoplasm, unspecified: Secondary | ICD-10-CM | POA: Diagnosis not present

## 2023-02-05 DIAGNOSIS — Z955 Presence of coronary angioplasty implant and graft: Secondary | ICD-10-CM | POA: Diagnosis not present

## 2023-02-05 DIAGNOSIS — Z87891 Personal history of nicotine dependence: Secondary | ICD-10-CM | POA: Diagnosis not present

## 2023-02-05 DIAGNOSIS — I4819 Other persistent atrial fibrillation: Secondary | ICD-10-CM | POA: Diagnosis not present

## 2023-02-05 DIAGNOSIS — Z7901 Long term (current) use of anticoagulants: Secondary | ICD-10-CM | POA: Diagnosis not present

## 2023-02-05 DIAGNOSIS — D6859 Other primary thrombophilia: Secondary | ICD-10-CM | POA: Diagnosis not present

## 2023-02-05 DIAGNOSIS — I25119 Atherosclerotic heart disease of native coronary artery with unspecified angina pectoris: Secondary | ICD-10-CM | POA: Diagnosis present

## 2023-02-05 DIAGNOSIS — J9811 Atelectasis: Secondary | ICD-10-CM | POA: Diagnosis not present

## 2023-02-05 DIAGNOSIS — K219 Gastro-esophageal reflux disease without esophagitis: Secondary | ICD-10-CM | POA: Diagnosis present

## 2023-02-05 DIAGNOSIS — Z8249 Family history of ischemic heart disease and other diseases of the circulatory system: Secondary | ICD-10-CM | POA: Diagnosis not present

## 2023-02-05 DIAGNOSIS — K683 Retroperitoneal hematoma: Secondary | ICD-10-CM | POA: Diagnosis not present

## 2023-02-05 DIAGNOSIS — Y838 Other surgical procedures as the cause of abnormal reaction of the patient, or of later complication, without mention of misadventure at the time of the procedure: Secondary | ICD-10-CM | POA: Diagnosis present

## 2023-02-05 DIAGNOSIS — E876 Hypokalemia: Secondary | ICD-10-CM | POA: Diagnosis not present

## 2023-02-05 DIAGNOSIS — I252 Old myocardial infarction: Secondary | ICD-10-CM | POA: Diagnosis not present

## 2023-02-05 DIAGNOSIS — Y713 Surgical instruments, materials and cardiovascular devices (including sutures) associated with adverse incidents: Secondary | ICD-10-CM | POA: Diagnosis present

## 2023-02-05 DIAGNOSIS — I5032 Chronic diastolic (congestive) heart failure: Secondary | ICD-10-CM | POA: Diagnosis not present

## 2023-02-05 DIAGNOSIS — E118 Type 2 diabetes mellitus with unspecified complications: Secondary | ICD-10-CM | POA: Diagnosis not present

## 2023-02-05 DIAGNOSIS — Z885 Allergy status to narcotic agent status: Secondary | ICD-10-CM | POA: Diagnosis not present

## 2023-02-05 LAB — HEMOGLOBIN AND HEMATOCRIT, BLOOD
HCT: 25.5 % — ABNORMAL LOW (ref 36.0–46.0)
Hemoglobin: 8 g/dL — ABNORMAL LOW (ref 12.0–15.0)

## 2023-02-05 MED ORDER — OXYCODONE HCL 5 MG PO TABS: 5.0000 mg | ORAL_TABLET | Freq: Four times a day (QID) | ORAL | 0 refills | Status: AC | PRN

## 2023-02-05 NOTE — Progress Notes (Signed)
Mobility Specialist Progress Note   02/05/23 1045  Mobility  Activity Ambulated with assistance in room;Transferred from bed to chair  Level of Assistance Contact guard assist, steadying assist  Assistive Device Front wheel walker  Distance Ambulated (ft) 25 ft  Range of Motion/Exercises Active;All extremities  Activity Response Tolerated well   Patient received in supine and agreeable to participate. Presented with deficits to strength, balance and coordination secondary to pain. Required minimal HHA for bed mobility and stood with min A + verbal cues for hand placement. Ambulated min guard with slow steady gait.  Tolerated without incident but limited by fatigue and pain. Was left in recliner with all needs met, call bell in reach.   [place] Emil Weigold, BS EXP Mobility Specialist Please contact via SecureChat or Rehab office at [phone]

## 2023-02-05 NOTE — Discharge Summary (Signed)
Physician Discharge Summary  [name] [id number] DOB: [birth year] DOA: 02/03/2023  PCP: Baruch Gouty, FNP  Admit date: 02/03/2023 Discharge date: 02/05/2023  Admitted From: Home Disposition: Home with home health, family support  Recommendations for Outpatient Follow-up:  Follow up with PCP in 1-2 weeks Please obtain BMP/CBC in one week Cardiology office to schedule follow-up  Home Health: PT/OT/RN Equipment/Devices: None  Discharge Condition: Fair CODE STATUS: Full code Diet recommendation: Low-salt and low-carb diet, nutritional supplements  Discharge summary: 87 year old with history of hypertension, hyperlipidemia, coronary artery disease, persistent A-fib on chronic anticoagulation, severe aortic valve stenosis who underwent TAVR on 2/6 presented with right abdominal and flank pain.  In the emergency department noted to be afebrile with elevated blood pressures.  Hemoglobin 8.9.  Electrolytes normal.  Magnesium 1.5.  CT scan of the chest abdomen pelvis showed 20 x 13 cm extraperitoneal hematoma on the right side.  Eppie Gibson was given.  Admitted to the hospital with cardiology and vascular surgery consultation.  Large spontaneous extraperitoneal hematoma after femoral puncture during TAVR 2/6.  Acquired thrombophilia on Eliquis.  Eliquis last dose 2/30 morning, received Kcentra for reversal of Eliquis. Right extrapleural hematoma 20 x 13 cm in size, currently stable.  Hemoglobin remained stable. Still has intermittent pain on stretching.  She will use Tylenol for mild pain, oxycodone for moderate to severe pain. Monitor in the hospital for 48 hours without any drop in hemoglobin or orthostatic symptoms. Seen by vascular surgery, recommended conservative management. Followed by cardiology, off anticoagulation for now until outpatient follow-up.   Severe aortic stenosis status post TAVR: Outpatient follow-up.   Persistent A-fib: Continue Coreg.  Holding Eliquis as  above.   Electrolytes including hypocalcemia, hypokalemia and hypomagnesemia: Replaced and adequate.   Essential hypertension: Blood pressure stable.   Hyperlipidemia: On statin.  Stable for discharge.  Discharge Diagnoses:  Principal Problem:   Hematoma of extraperitoneal space Active Problems:   Severe calcific aortic valve stenosis   S/P TAVR (transcatheter aortic valve replacement)   CAD S/P percutaneous coronary angioplasty   Elevated troponin   Persistent atrial fibrillation (HCC)   Hypomagnesemia   Hypokalemia   Hypocalcemia   Postoperative anemia   Essential hypertension   Diet-controlled diabetes mellitus ([place])   Hyperlipidemia   Retroperitoneal hematoma    Discharge Instructions  Discharge Instructions     Call MD for:  difficulty breathing, headache or visual disturbances   Complete by: As directed    Call MD for:  persistant dizziness or light-headedness   Complete by: As directed    Call MD for:  severe uncontrolled pain   Complete by: As directed    Diet - low sodium heart healthy   Complete by: As directed    Increase activity slowly   Complete by: As directed       Allergies as of 02/05/2023       Reactions   Valium Nausea And Vomiting        Medication List     STOP taking these medications    apixaban 2.5 MG Tabs tablet Commonly known as: Eliquis   hydrocortisone 2.5 % rectal cream Commonly known as: ANUSOL-HC   traMADol 50 MG tablet Commonly known as: ULTRAM       TAKE these medications    atorvastatin 20 MG tablet Commonly known as: LIPITOR TAKE ONE (1) TABLET EACH DAY What changed: See the new instructions.   carvedilol 25 MG tablet Commonly known as: COREG TAKE ONE TABLET BY MOUTH TWICE  DAILY   diphenhydramine-acetaminophen 25-500 MG Tabs tablet Commonly known as: TYLENOL PM Take 2 tablets by mouth at bedtime.   furosemide 40 MG tablet Commonly known as: LASIX Take 1 tablet (40 mg total) by mouth daily as  needed for fluid.   hydrALAZINE 100 MG tablet Commonly known as: APRESOLINE TAKE ONE (1) TABLET BY MOUTH TWO (2) TIMES DAILY What changed: See the new instructions.   loperamide 2 MG tablet Commonly known as: IMODIUM A-D Take 2 mg by mouth 4 (four) times daily as needed for diarrhea or loose stools.   Nitrostat 0.4 MG SL tablet Generic drug: nitroGLYCERIN DISSOLVE 1 TAB UNDER TOUNGE FOR CHEST PAIN. MAY REPEAT EVERY 5 MINUTES FOR 3 DOSES. IF NO RELIEF CALL 911 OR GO TO ER What changed: See the new instructions.   oxyCODONE 5 MG immediate release tablet Commonly known as: Roxicodone Take 1 tablet (5 mg total) by mouth every 6 (six) hours as needed for up to 5 days for severe pain or moderate pain.   pantoprazole 40 MG tablet Commonly known as: PROTONIX TAKE 1 TABLET BY MOUTH TWICE DAILY BEFORE MEALS        Follow-up Information     Tommie Raymond, NP Follow up on 02/13/2023.   Specialty: Cardiology Why: @ 10am. Please arrive at 945am. Contact information: [address] [place] [address] [phone]                Allergies  Allergen Reactions   Valium Nausea And Vomiting    Consultations: Cardiology Vascular surgery   Procedures/Studies: CT Angio Chest/Abd/Pel for Dissection W and/or Wo Contrast  Result Date: 02/03/2023 CLINICAL DATA:  Recent TAVR a shin with chest pain. CT scan 12/03/2022 EXAM: CT ANGIOGRAPHY CHEST, ABDOMEN AND PELVIS TECHNIQUE: Non-contrast CT of the chest was initially obtained. Multidetector CT imaging through the chest, abdomen and pelvis was performed using the standard protocol during bolus administration of intravenous contrast. Multiplanar reconstructed images and MIPs were obtained and reviewed to evaluate the vascular anatomy. RADIATION DOSE REDUCTION: This exam was performed according to the departmental dose-optimization program which includes automated exposure control, adjustment of the mA and/or kV according to  patient size and/or use of iterative reconstruction technique. CONTRAST:  45m OMNIPAQUE IOHEXOL 350 MG/ML SOLN COMPARISON:  CT scan 12/03/2022 FINDINGS: CTA CHEST FINDINGS Cardiovascular: The heart is borderline enlarged but stable. No pericardial effusion. Surgical changes from recent TAVR. No complicating features are identified. No aortic aneurysm or dissection. Stable atherosclerotic calcifications mainly involving the aortic arch and descending thoracic aorta. Stable three-vessel coronary artery calcifications. The pulmonary arteries are unremarkable. No findings suspicious for pulmonary embolism. Significant reflux of contrast down the IVC and into the hepatic veins could be due to tricuspid regurgitation. Mediastinum/Nodes: No mediastinal or hilar mass or lymphadenopathy the. Moderate-sized hiatal hernia again noted. Lungs/Pleura: Moderate-sized bilateral pleural effusions, right larger than left with overlying atelectasis. No pulmonary edema. No pneumothorax. Musculoskeletal: No breast masses, supraclavicular or axillary adenopathy. Mild diffuse body wall edema noted. The bony thorax is intact. No bone lesions or fractures. Review of the MIP images confirms the above findings. CTA ABDOMEN AND PELVIS FINDINGS VASCULAR Aorta: Normal caliber. No dissection. Advanced atherosclerotic calcification. Celiac: Moderate ostial calcifications but no significant stenosis. SMA: Ostial calcifications but no stenosis or aneurysm. Renals: Bilateral renal artery calcifications with moderate stenosis bilaterally. Second smaller left renal artery is unremarkable. IMA: Patent Inflow: Advanced atherosclerotic calcifications but no significant stenosis, aneurysm or dissection. Veins: Grossly normal.  Review of the MIP images confirms the above findings. NON-VASCULAR Hepatobiliary: No hepatic lesions or intrahepatic biliary dilatation. The gallbladder is grossly normal. Suspect changes of hepatic congestion given the amount of  reflux of contrast. The gallbladder is grossly normal. No common bile duct dilatation. Pancreas: No mass, inflammation ductal dilatation. Spleen: Normal size.  No focal lesions. Adrenals/Urinary Tract: The adrenal glands and kidneys are grossly normal. The bladder is unremarkable. Stomach/Bowel: The stomach, duodenum, small bowel and colon are grossly normal. No obvious obstructive findings. Lymphatic: No abdominal or pelvic lymphadenopathy. Reproductive: Surgically absent. Other: There is a large extraperitoneal hematoma on the right side extending from the pelvis poly up to the liver. It measures approximately 20 x 13 x 6.5 cm. No large inguinal hematoma. There is also a moderate amount of simple appearing fluid surrounding the liver and spleen and also in the pelvis. Diffuse body wall edema is also noted. Musculoskeletal: No significant bony findings. Review of the MIP images confirms the above findings. IMPRESSION: 1. Surgical changes from recent TAVR. No complicating features are identified. 2. Large extraperitoneal abdominal/pelvic hematoma on the right side. It measures approximately 20 x 13 x 6.5 cm. 3. Moderate-sized bilateral pleural effusions, right larger than left with overlying atelectasis. 4. Advanced vascular disease but no aneurysm, dissection or significant stenosis. 5. Suspect changes of hepatic congestion given the amount of reflux of contrast down the IVC and into the hepatic veins. 6. Moderate amount of free abdominal/pelvic fluid. 7. Diffuse body wall edema. These results will be called to the ordering clinician or representative by the Radiologist Assistant, and communication documented in the PACS or [employer]. Electronically Signed   By: Marijo Sanes M.D.   On: 02/03/2023 13:18   DG Chest Port 1 View  Result Date: 02/03/2023 CLINICAL DATA:  cp, recent tavr, abdominal pain since last night EXAM: PORTABLE CHEST 1 VIEW COMPARISON:  01/23/2023 chest radiograph. FINDINGS: Aortic  valve prosthesis in place. Surgical clips overlie left axilla. Stable cardiomediastinal silhouette with mild to moderate cardiomegaly. No pneumothorax. Stable small right pleural effusion. No left pleural effusion. No overt pulmonary edema. Streaky hazy bibasilar lung opacities, favor atelectasis. IMPRESSION: 1. Stable mild to moderate cardiomegaly without overt pulmonary edema. 2. Stable small right pleural effusion. 3. Streaky hazy bibasilar lung opacities, favor atelectasis. Electronically Signed   By: Ilona Sorrel M.D.   On: 02/03/2023 11:13   ECHOCARDIOGRAM COMPLETE  Result Date: 01/28/2023    ECHOCARDIOGRAM REPORT   Patient Name:   [name] Date of Exam: 01/28/2023 Medical Rec #:  [id number]         Height:       65.5 in Accession #:    [id number]        Weight:       141.1 lb Date of Birth:  Mar 09, 1936         BSA:          1.715 m Patient Age:    37 years          BP:           118/60 mmHg Patient Gender: F                 HR:           79 bpm. Exam Location:  Inpatient Procedure: 2D Echo, Cardiac Doppler and Color Doppler Indications:    I35.0 Nonrheumatic aortic (valve) stenosis  History:        Patient has prior history of Echocardiogram  examinations, most                 recent 01/27/2023. CHF, CAD, Abnormal ECG, Aortic Valve Disease,                 Arrythmias:Atrial Fibrillation; Risk Factors:Dyslipidemia and                 Hypertension. TAVR procedure.                 Aortic Valve: 23 mm Sapien prosthetic, stented (TAVR) valve is                 present in the aortic position. Procedure Date: 01/27/2023.  Sonographer:    Roseanna Rainbow RDCS Referring Phys: Alphonsa Overall MCDANIEL IMPRESSIONS  1. Left ventricular ejection fraction, by estimation, is 65 to 70%. The left ventricle has normal function. The left ventricle has no regional wall motion abnormalities. There is mild concentric left ventricular hypertrophy. Left ventricular diastolic function could not be evaluated. There is the interventricular septum  is flattened in diastole ('D' shaped left ventricle), consistent with right ventricular volume overload.  2. Right ventricular systolic function is mildly reduced. The right ventricular size is moderately enlarged. There is mildly elevated pulmonary artery systolic pressure. The estimated right ventricular systolic pressure is 0000000 mmHg.  3. Left atrial size was severely dilated.  4. Right atrial size was severely dilated.  5. The mitral valve is normal in structure. Mild mitral valve regurgitation.  6. The tricuspid valve is abnormal. Tricuspid valve regurgitation is severe.  7. The aortic valve has been repaired/replaced. Aortic valve regurgitation is not visualized. There is a 23 mm Sapien prosthetic (TAVR) valve present in the aortic position. Procedure Date: 01/27/2023. Echo findings are consistent with normal structure and function of the aortic valve prosthesis. Aortic valve mean gradient measures 6.0 mmHg. Aortic valve Vmax measures 1.69 m/s. Aortic valve acceleration time measures 53 msec.  8. The inferior vena cava is dilated in size with <50% respiratory variability, suggesting right atrial pressure of 15 mmHg. Comparison(s): Prior images reviewed side by side. Tricuspid insufficiency is worse. FINDINGS  Left Ventricle: Left ventricular ejection fraction, by estimation, is 65 to 70%. The left ventricle has normal function. The left ventricle has no regional wall motion abnormalities. The left ventricular internal cavity size was normal in size. There is  mild concentric left ventricular hypertrophy. The interventricular septum is flattened in diastole ('D' shaped left ventricle), consistent with right ventricular volume overload. Left ventricular diastolic function could not be evaluated due to atrial fibrillation. Left ventricular diastolic function could not be evaluated. Right Ventricle: The right ventricular size is moderately enlarged. No increase in right ventricular wall thickness. Right ventricular  systolic function is mildly reduced. There is mildly elevated pulmonary artery systolic pressure. The tricuspid regurgitant velocity is 2.66 m/s, and with an assumed right atrial pressure of 15 mmHg, the estimated right ventricular systolic pressure is 0000000 mmHg. Left Atrium: Left atrial size was severely dilated. Right Atrium: Right atrial size was severely dilated. Pericardium: Trivial pericardial effusion is present. The pericardial effusion is localized near the right atrium. Mitral Valve: The mitral valve is normal in structure. Mild mitral annular calcification. Mild mitral valve regurgitation, with centrally-directed jet. Tricuspid Valve: There is severe leaflet malcoaptation. The tricuspid valve is abnormal. Tricuspid valve regurgitation is severe. Aortic Valve: The aortic valve has been repaired/replaced. Aortic valve regurgitation is not visualized. Aortic valve mean gradient measures 6.0 mmHg. Aortic valve peak gradient measures  11.4 mmHg. Aortic valve area, by VTI measures 2.92 cm. There is a 23 mm Sapien prosthetic, stented (TAVR) valve present in the aortic position. Procedure Date: 01/27/2023. Echo findings are consistent with normal structure and function of the aortic valve prosthesis. Pulmonic Valve: The pulmonic valve was grossly normal. Pulmonic valve regurgitation is not visualized. No evidence of pulmonic stenosis. Aorta: The aortic root is normal in size and structure. Venous: The inferior vena cava is dilated in size with less than 50% respiratory variability, suggesting right atrial pressure of 15 mmHg. The inferior vena cava and the hepatic vein show a pattern of systolic flow reversal, suggestive of tricuspid regurgitation. IAS/Shunts: No atrial level shunt detected by color flow Doppler.  LEFT VENTRICLE PLAX 2D LVIDd:         3.40 cm LVIDs:         2.10 cm LV PW:         1.30 cm LV IVS:        1.00 cm LVOT diam:     2.40 cm LV SV:         85 LV SV Index:   50 LVOT Area:     4.52 cm  RIGHT  VENTRICLE            IVC RV S prime:     8.92 cm/s  IVC diam: 2.40 cm TAPSE (M-mode): 1.0 cm LEFT ATRIUM              Index        RIGHT ATRIUM           Index LA diam:        5.30 cm  3.09 cm/m   RA Area:     34.50 cm LA Vol (A2C):   111.0 ml 64.71 ml/m  RA Volume:   143.00 ml 83.37 ml/m LA Vol (A4C):   85.4 ml  49.79 ml/m LA Biplane Vol: 103.0 ml 60.05 ml/m  AORTIC VALVE AV Area (Vmax):    2.84 cm AV Area (Vmean):   2.84 cm AV Area (VTI):     2.92 cm AV Vmax:           169.00 cm/s AV Vmean:          111.000 cm/s AV VTI:            0.291 m AV Peak Grad:      11.4 mmHg AV Mean Grad:      6.0 mmHg LVOT Vmax:         106.00 cm/s LVOT Vmean:        69.800 cm/s LVOT VTI:          0.188 m LVOT/AV VTI ratio: 0.65  AORTA Ao Root diam: 3.20 cm Ao Asc diam:  3.30 cm MITRAL VALVE                TRICUSPID VALVE MV Area (PHT): 3.72 cm     TR Peak grad:   28.3 mmHg MV Decel Time: 204 msec     TR Vmax:        266.00 cm/s MV E velocity: 120.00 cm/s                             SHUNTS                             Systemic VTI:  0.19 m  Systemic Diam: 2.40 cm Sanda Klein MD Electronically signed by Sanda Klein MD Signature Date/Time: 01/28/2023/12:00:39 PM    Final    ECHO TEE  Result Date: 01/27/2023    TRANSESOPHOGEAL ECHO REPORT   Patient Name:   [name] Date of Exam: 01/27/2023 Medical Rec #:  [id number]         Height:       65.5 in Accession #:    [id number]        Weight:       141.1 lb Date of Birth:  09-17-1936         BSA:          1.715 m Patient Age:    61 years          BP:           173/78 mmHg Patient Gender: F                 HR:           95 bpm. Exam Location:  Inpatient Procedure: Transesophageal Echo, Color Doppler and Cardiac Doppler Indications:     Aortic stenosis, severe  History:         Patient has prior history of Echocardiogram examinations, most                  recent 06/20/2022. CAD and Previous Myocardial Infarction,                  Aortic Valve  Disease, Arrythmias:Atrial Fibrillation; Risk                  Factors:Hypertension, Diabetes and Dyslipidemia.                  Aortic Valve: 23 mm Sapien prosthetic, stented (TAVR) valve is                  present in the aortic position. Procedure Date: 01/27/2023.  Sonographer:     Darlina Sicilian RDCS Referring Phys:  [place] Diagnosing Phys: Sanda Klein MD PROCEDURE: After discussion of the risks and benefits of a TEE, an informed consent was obtained from the patient. The patient was intubated. The transesophogeal probe was passed without difficulty through the esophogus of the patient. Imaged were obtained with the patient in a supine position. Sedation performed by different physician. The patient was monitored while under deep sedation. Anesthestetic sedation was provided intravenously by Anesthesiology: 37m of Propofol, 647mof Lidocaine. Image quality was good. The patient developed Respiratory depression and no complications during the procedure.   PRE-PROCEDURE FINDINGS Normal left ventricular systolic function and regional wall motion. Estimated LVEF 60% Trileaflet aortic valve with severe calcific stenosis. Aortic valve peak gradient 34 mm Hg, mean gradient 18 mm Hg, dimensionless index 0.28, calculated valve area 0.88 cm sq (0.51 cm sq/m sq BSA). Severe paradoxical low flow, low gradient aortic stenosis. Trivial aortic insufficiency. There is mild mitral insufficiency. There is severe tricuspid insufficiency. Trivial circumferential pericardial effusion. POST-PROCEDURE FINDINGS Normal left ventricular systolic function and regional wall motion. Estimated LVEF 65% Well deployed 23 mm Edwards S3U stent valve (TAVR). Aortic valve peak gradient 4 mm Hg, mean gradient 2 mm Hg, dimensionless index 0.92, calculated valve area 2.89 cm sq (1.68 cm sq/m sq BSA), acceleration time 92 ms. Trivial intravalvular leak, towards the atrial septum aspect of the valve, clearly seen as  originating from the commisure between the neo-left and neo-noncoronary cusps. Tricuspid insufficiency  remains unchanged, severe. Unchanged trivial pericardial effusion.  IMPRESSIONS  1. Left ventricular ejection fraction, by estimation, is 60 to 65%. The left ventricle has normal function. The left ventricle has no regional wall motion abnormalities. There is mild concentric left ventricular hypertrophy. Left ventricular diastolic function could not be evaluated.  2. Right ventricular systolic function is mildly reduced. The right ventricular size is mildly enlarged. There is moderately elevated pulmonary artery systolic pressure. The estimated right ventricular systolic pressure is 123456 mmHg.  3. Left atrial size was severely dilated. No left atrial/left atrial appendage thrombus was detected.  4. Right atrial size was severely dilated. Dilated coronary sinus.  5. The mitral valve is grossly normal. Mild mitral valve regurgitation. No evidence of mitral stenosis.  6. The tricuspid valve is abnormal. Tricuspid valve regurgitation is severe.  7. The aortic valve is tricuspid. There is severe calcifcation of the aortic valve. There is severe thickening of the aortic valve. Aortic valve regurgitation is trivial. Severe aortic valve stenosis. There is a 23 mm Sapien prosthetic (TAVR) valve present in the aortic position. Procedure Date: 01/27/2023. Aortic valve mean gradient measures 2.0 mmHg. Aortic valve Vmax measures 1.05 m/s. Aortic valve acceleration time measures 92 msec.  8. There is mild (Grade II) layered plaque involving the descending aorta. FINDINGS  Left Ventricle: Left ventricular ejection fraction, by estimation, is 60 to 65%. The left ventricle has normal function. The left ventricle has no regional wall motion abnormalities. The left ventricular internal cavity size was normal in size. There is  mild concentric left ventricular hypertrophy. Left ventricular diastolic function could not be evaluated due  to atrial fibrillation. Left ventricular diastolic function could not be evaluated. Right Ventricle: The right ventricular size is mildly enlarged. No increase in right ventricular wall thickness. Right ventricular systolic function is mildly reduced. There is moderately elevated pulmonary artery systolic pressure. The tricuspid regurgitant velocity is 2.92 m/s, and with an assumed right atrial pressure of 15 mmHg, the estimated right ventricular systolic pressure is 123456 mmHg. Left Atrium: Left atrial size was severely dilated. No left atrial/left atrial appendage thrombus was detected. Right Atrium: Right atrial size was severely dilated. Dilated coronary sinus. Pericardium: Trivial pericardial effusion is present. Mitral Valve: The mitral valve is grossly normal. Mild mitral valve regurgitation, with centrally-directed jet. No evidence of mitral valve stenosis. Tricuspid Valve: There is poor leaflet coaptation, especially between the anterior and posterior leaflets. The tricuspid valve is abnormal. Tricuspid valve regurgitation is severe. No evidence of tricuspid stenosis. Aortic Valve: The aortic valve is tricuspid. There is severe calcifcation of the aortic valve. There is severe thickening of the aortic valve. Aortic valve regurgitation is trivial. Severe aortic stenosis is present. Aortic valve mean gradient measures 2.0 mmHg. Aortic valve peak gradient measures 4.4 mmHg. Aortic valve area, by VTI measures 2.66 cm. There is a 23 mm Sapien prosthetic, stented (TAVR) valve present in the aortic position. Procedure Date: 01/27/2023. Pulmonic Valve: The pulmonic valve was grossly normal. Pulmonic valve regurgitation is trivial. No evidence of pulmonic stenosis. Aorta: The aortic root and ascending aorta are structurally normal, with no evidence of dilitation and the aortic root, ascending aorta and aortic arch are all structurally normal, with no evidence of dilitation or obstruction. There is mild (Grade II)  layered plaque involving the descending aorta. IAS/Shunts: No atrial level shunt detected by color flow Doppler. Additional Comments: There is a small pleural effusion in the left lateral region. LEFT VENTRICLE PLAX 2D LVOT diam:  2.00 cm LV SV:         53 LV SV Index:   31 LVOT Area:     3.14 cm  AORTIC VALVE AV Area (Vmax):    2.61 cm AV Area (Vmean):   2.67 cm AV Area (VTI):     2.66 cm AV Vmax:           105.00 cm/s AV Vmean:          68.500 cm/s AV VTI:            0.198 m AV Peak Grad:      4.4 mmHg AV Mean Grad:      2.0 mmHg LVOT Vmax:         87.15 cm/s LVOT Vmean:        58.166 cm/s LVOT VTI:          0.167 m LVOT/AV VTI ratio: 0.85 TRICUSPID VALVE TR Peak grad:   34.1 mmHg TR Vmax:        292.00 cm/s  SHUNTS Systemic VTI:  0.17 m Systemic Diam: 2.00 cm Sanda Klein MD Electronically signed by Sanda Klein MD Signature Date/Time: 01/27/2023/1:51:47 PM    Final    EP STUDY  Result Date: 01/27/2023 See surgical note for result.  Structural Heart Procedure  Result Date: 01/27/2023 See surgical note for result.  DG Chest 2 View  Result Date: 01/23/2023 CLINICAL DATA:  Preadmit for TAVR procedure. History of aortic stenosis, shortness of breath. EXAM: CHEST - 2 VIEW COMPARISON:  Chest x-ray dated 11/17/2022. Chest CT dated 12/03/2022. FINDINGS: Persistent opacity at the RIGHT lung base, likely a combination of atelectasis and pleural effusion. Probable small LEFT pleural effusion. Upper lungs are clear. No evidence of consolidating pneumonia. No pneumothorax is seen. Cardiomegaly. Atherosclerosis of the thoracic aorta. No acute-appearing osseous abnormality. IMPRESSION: 1. Persistent opacity at the RIGHT lung base, likely a combination of atelectasis and pleural effusion, as also demonstrated on the chest CT of 12/03/2022. 2. Probable small LEFT pleural effusion. 3. Cardiomegaly. 4. Aortic atherosclerosis. Electronically Signed   By: Franki Cabot M.D.   On: 01/23/2023 11:34   (Echo, Carotid,  EGD, Colonoscopy, ERCP)    Subjective: Patient seen and examined in the morning rounds.  2 daughters were at the bedside.  Patient denied any complaints at rest.  She still gets significant pain when attempting to get out of the bed and is stretching her back.  Denies any nausea vomiting.  Bowel and urine is normal. We discussed about discharging home and prescribing pain medications, patient and family felt comfortable going home.   Discharge Exam: Vitals:   02/05/23 0444 02/05/23 0804  BP: (!) 157/70 (!) 107/42  Pulse: 78 79  Resp: 17 16  Temp: 98.6 F (37 C) 97.6 F (36.4 C)  SpO2: 95% 96%   Vitals:   02/04/23 2113 02/05/23 0024 02/05/23 0444 02/05/23 0804  BP: 124/61 (!) 155/76 (!) 157/70 (!) 107/42  Pulse: 71 83 78 79  Resp: 18 18 17 16  $ Temp: 98.1 F (36.7 C) 98 F (36.7 C) 98.6 F (37 C) 97.6 F (36.4 C)  TempSrc: Oral Oral Oral Oral  SpO2: 96% 98% 95% 96%  Weight:   65.1 kg   Height:        General: Pt is alert, awake, not in acute distress, on room air.  Mildly anxious. Cardiovascular: RRR, S1/S2 +, no rubs, no gallops Respiratory: CTA bilaterally, no wheezing, no rhonchi Abdominal: Soft, NT, ND, bowel sounds +  Extremities: no edema, no cyanosis    The results of significant diagnostics from this hospitalization (including imaging, microbiology, ancillary and laboratory) are listed below for reference.     Microbiology: No results found for this or any previous visit (from the past 240 hour(s)).   Labs: BNP (last 3 results) Recent Labs    02/03/23 1715  BNP [id number]*   Basic Metabolic Panel: Recent Labs  Lab 02/03/23 1201 02/03/23 1320 02/04/23 0446  NA 138 136 137  K 5.0 3.4* 4.0  CL 103 102 99  CO2  --  24 26  GLUCOSE 122* 121* 112*  BUN 14 11 11  $ CREATININE 0.60 0.68 0.78  CALCIUM  --  8.5* 8.7*  MG  --  1.5* 2.0   Liver Function Tests: Recent Labs  Lab 02/03/23 1320  AST 17  ALT 8  ALKPHOS 53  BILITOT 1.4*  PROT 6.5  ALBUMIN  3.5   Recent Labs  Lab 02/03/23 1320  LIPASE 39   No results for input(s): "AMMONIA" in the last 168 hours. CBC: Recent Labs  Lab 02/03/23 1120 02/03/23 1201 02/03/23 1837 02/04/23 0446 02/04/23 1839 02/05/23 0217  WBC 6.6  --   --  8.2  --   --   NEUTROABS 5.2  --   --   --   --   --   HGB 8.9* 9.5* 8.6* 8.6* 8.4* 8.0*  HCT 27.7* 28.0* 27.5* 26.6* 25.8* 25.5*  MCV 93.0  --   --  91.4  --   --   PLT 165  --   --  161  --   --    Cardiac Enzymes: No results for input(s): "CKTOTAL", "CKMB", "CKMBINDEX", "TROPONINI" in the last 168 hours. BNP: Invalid input(s): "POCBNP" CBG: No results for input(s): "GLUCAP" in the last 168 hours. D-Dimer No results for input(s): "DDIMER" in the last 72 hours. Hgb A1c No results for input(s): "HGBA1C" in the last 72 hours. Lipid Profile No results for input(s): "CHOL", "HDL", "LDLCALC", "TRIG", "CHOLHDL", "LDLDIRECT" in the last 72 hours. Thyroid function studies No results for input(s): "TSH", "T4TOTAL", "T3FREE", "THYROIDAB" in the last 72 hours.  Invalid input(s): "FREET3" Anemia work up No results for input(s): "VITAMINB12", "FOLATE", "FERRITIN", "TIBC", "IRON", "RETICCTPCT" in the last 72 hours. Urinalysis    Component Value Date/Time   COLORURINE YELLOW 02/03/2023 1436   APPEARANCEUR CLEAR 02/03/2023 1436   LABSPEC >1.046 (H) 02/03/2023 1436   PHURINE 7.0 02/03/2023 1436   GLUCOSEU NEGATIVE 02/03/2023 1436   HGBUR NEGATIVE 02/03/2023 1436   BILIRUBINUR NEGATIVE 02/03/2023 1436   KETONESUR NEGATIVE 02/03/2023 1436   PROTEINUR NEGATIVE 02/03/2023 1436   UROBILINOGEN 0.2 09/03/2011 1901   NITRITE NEGATIVE 02/03/2023 1436   LEUKOCYTESUR NEGATIVE 02/03/2023 1436   Sepsis Labs Recent Labs  Lab 02/03/23 1120 02/04/23 0446  WBC 6.6 8.2   Microbiology No results found for this or any previous visit (from the past 240 hour(s)).   Time coordinating discharge:  32 minutes  SIGNED:   Barb Merino, MD  [facility] 02/05/2023, 9:27 AM

## 2023-02-05 NOTE — Plan of Care (Signed)

## 2023-02-05 NOTE — Progress Notes (Addendum)
[place] VALVE TEAM  Patient Name: [name] Date of Encounter: 02/05/2023  Primary Cardiologist: Glenetta Hew, MD  / Dr. Angelena Form, MD and Dr. Cyndia Bent, MD (TAVR)     [facility] Problem List     Principal Problem:   Hematoma of extraperitoneal space Active Problems:   Essential hypertension   Hyperlipidemia   Severe calcific aortic valve stenosis   CAD S/P percutaneous coronary angioplasty   Persistent atrial fibrillation (HCC)   Diet-controlled diabetes mellitus (HCC)   S/P TAVR (transcatheter aortic valve replacement)   Hypomagnesemia   Elevated troponin   Hypokalemia   Hypocalcemia   Postoperative anemia  Subjective   Feeling worse today due to poor sleep and pain. No chest pain or SOB.   Inpatient Medications    Scheduled Meds:  diphenhydrAMINE  50 mg Oral QHS   And   acetaminophen  1,000 mg Oral QHS   atorvastatin  20 mg Oral QPM   carvedilol  25 mg Oral BID   hydrALAZINE  100 mg Oral BID   pantoprazole  40 mg Oral BID AC   sodium chloride flush  3 mL Intravenous Q12H   Continuous Infusions:  PRN Meds: acetaminophen **OR** acetaminophen, albuterol, furosemide, HYDROcodone-acetaminophen, HYDROmorphone (DILAUDID) injection, loperamide, ondansetron **OR** ondansetron (ZOFRAN) IV   Vital Signs    Vitals:   02/04/23 2113 02/05/23 0024 02/05/23 0444 02/05/23 0804  BP: 124/61 (!) 155/76 (!) 157/70 (!) 107/42  Pulse: 71 83 78 79  Resp: 18 18 17 16  $ Temp: 98.1 F (36.7 C) 98 F (36.7 C) 98.6 F (37 C) 97.6 F (36.4 C)  TempSrc: Oral Oral Oral Oral  SpO2: 96% 98% 95% 96%  Weight:   65.1 kg   Height:        Intake/Output Summary (Last 24 hours) at 02/05/2023 0910 Last data filed at 02/05/2023 [id number] Gross per 24 hour  Intake 240 ml  Output 200 ml  Net 40 ml   Filed Weights   02/03/23 2005 02/04/23 0649 02/05/23 0444  Weight: 60.1 kg 60.1 kg 65.1 kg   Physical Exam   General: Well developed, well  nourished, NAD Neck: Negative for carotid bruits. No JVD Lungs:Clear to ausculation bilaterally. No wheezes, rales, or rhonchi. Breathing is unlabored. Cardiovascular: RRR with S1 S2. No murmurs Abdomen: Soft, non-tender, non-distended. No obvious abdominal masses. Extremities: No edema. Neuro: Alert and oriented. No focal deficits. No facial asymmetry. MAE spontaneously. Psych: Responds to questions appropriately with normal affect.    Labs    CBC Recent Labs    02/03/23 1120 02/03/23 1201 02/04/23 0446 02/04/23 1839 02/05/23 0217  WBC 6.6  --  8.2  --   --   NEUTROABS 5.2  --   --   --   --   HGB 8.9*   < > 8.6* 8.4* 8.0*  HCT 27.7*   < > 26.6* 25.8* 25.5*  MCV 93.0  --  91.4  --   --   PLT 165  --  161  --   --    < > = values in this interval not displayed.   Basic Metabolic Panel Recent Labs    02/03/23 1320 02/04/23 0446  NA 136 137  K 3.4* 4.0  CL 102 99  CO2 24 26  GLUCOSE 121* 112*  BUN 11 11  CREATININE 0.68 0.78  CALCIUM 8.5* 8.7*  MG 1.5* 2.0   Liver Function Tests Recent Labs  02/03/23 1320  AST 17  ALT 8  ALKPHOS 53  BILITOT 1.4*  PROT 6.5  ALBUMIN 3.5   Recent Labs    02/03/23 1320  LIPASE 39   Cardiac Enzymes No results for input(s): "CKTOTAL", "CKMB", "CKMBINDEX", "TROPONINI" in the last 72 hours. BNP Invalid input(s): "POCBNP" D-Dimer No results for input(s): "DDIMER" in the last 72 hours. Hemoglobin A1C No results for input(s): "HGBA1C" in the last 72 hours. Fasting Lipid Panel No results for input(s): "CHOL", "HDL", "LDLCALC", "TRIG", "CHOLHDL", "LDLDIRECT" in the last 72 hours. Thyroid Function Tests No results for input(s): "TSH", "T4TOTAL", "T3FREE", "THYROIDAB" in the last 72 hours.  Invalid input(s): "FREET3"  Telemetry    AF - Personally Reviewed  ECG    No new tracing as of 02/05/23 - Personally Reviewed  Radiology    CT Angio Chest/Abd/Pel for Dissection W and/or Wo Contrast  Result Date:  02/03/2023 CLINICAL DATA:  Recent TAVR a shin with chest pain. CT scan 12/03/2022 EXAM: CT ANGIOGRAPHY CHEST, ABDOMEN AND PELVIS TECHNIQUE: Non-contrast CT of the chest was initially obtained. Multidetector CT imaging through the chest, abdomen and pelvis was performed using the standard protocol during bolus administration of intravenous contrast. Multiplanar reconstructed images and MIPs were obtained and reviewed to evaluate the vascular anatomy. RADIATION DOSE REDUCTION: This exam was performed according to the departmental dose-optimization program which includes automated exposure control, adjustment of the mA and/or kV according to patient size and/or use of iterative reconstruction technique. CONTRAST:  63m OMNIPAQUE IOHEXOL 350 MG/ML SOLN COMPARISON:  CT scan 12/03/2022 FINDINGS: CTA CHEST FINDINGS Cardiovascular: The heart is borderline enlarged but stable. No pericardial effusion. Surgical changes from recent TAVR. No complicating features are identified. No aortic aneurysm or dissection. Stable atherosclerotic calcifications mainly involving the aortic arch and descending thoracic aorta. Stable three-vessel coronary artery calcifications. The pulmonary arteries are unremarkable. No findings suspicious for pulmonary embolism. Significant reflux of contrast down the IVC and into the hepatic veins could be due to tricuspid regurgitation. Mediastinum/Nodes: No mediastinal or hilar mass or lymphadenopathy the. Moderate-sized hiatal hernia again noted. Lungs/Pleura: Moderate-sized bilateral pleural effusions, right larger than left with overlying atelectasis. No pulmonary edema. No pneumothorax. Musculoskeletal: No breast masses, supraclavicular or axillary adenopathy. Mild diffuse body wall edema noted. The bony thorax is intact. No bone lesions or fractures. Review of the MIP images confirms the above findings. CTA ABDOMEN AND PELVIS FINDINGS VASCULAR Aorta: Normal caliber. No dissection. Advanced  atherosclerotic calcification. Celiac: Moderate ostial calcifications but no significant stenosis. SMA: Ostial calcifications but no stenosis or aneurysm. Renals: Bilateral renal artery calcifications with moderate stenosis bilaterally. Second smaller left renal artery is unremarkable. IMA: Patent Inflow: Advanced atherosclerotic calcifications but no significant stenosis, aneurysm or dissection. Veins: Grossly normal. Review of the MIP images confirms the above findings. NON-VASCULAR Hepatobiliary: No hepatic lesions or intrahepatic biliary dilatation. The gallbladder is grossly normal. Suspect changes of hepatic congestion given the amount of reflux of contrast. The gallbladder is grossly normal. No common bile duct dilatation. Pancreas: No mass, inflammation ductal dilatation. Spleen: Normal size.  No focal lesions. Adrenals/Urinary Tract: The adrenal glands and kidneys are grossly normal. The bladder is unremarkable. Stomach/Bowel: The stomach, duodenum, small bowel and colon are grossly normal. No obvious obstructive findings. Lymphatic: No abdominal or pelvic lymphadenopathy. Reproductive: Surgically absent. Other: There is a large extraperitoneal hematoma on the right side extending from the pelvis poly up to the liver. It measures approximately 20 x 13 x 6.5 cm. No large inguinal hematoma. There  is also a moderate amount of simple appearing fluid surrounding the liver and spleen and also in the pelvis. Diffuse body wall edema is also noted. Musculoskeletal: No significant bony findings. Review of the MIP images confirms the above findings. IMPRESSION: 1. Surgical changes from recent TAVR. No complicating features are identified. 2. Large extraperitoneal abdominal/pelvic hematoma on the right side. It measures approximately 20 x 13 x 6.5 cm. 3. Moderate-sized bilateral pleural effusions, right larger than left with overlying atelectasis. 4. Advanced vascular disease but no aneurysm, dissection or significant  stenosis. 5. Suspect changes of hepatic congestion given the amount of reflux of contrast down the IVC and into the hepatic veins. 6. Moderate amount of free abdominal/pelvic fluid. 7. Diffuse body wall edema. These results will be called to the ordering clinician or representative by the Radiologist Assistant, and communication documented in the PACS or [employer]. Electronically Signed   By: Marijo Sanes M.D.   On: 02/03/2023 13:18   DG Chest Port 1 View  Result Date: 02/03/2023 CLINICAL DATA:  cp, recent tavr, abdominal pain since last night EXAM: PORTABLE CHEST 1 VIEW COMPARISON:  01/23/2023 chest radiograph. FINDINGS: Aortic valve prosthesis in place. Surgical clips overlie left axilla. Stable cardiomediastinal silhouette with mild to moderate cardiomegaly. No pneumothorax. Stable small right pleural effusion. No left pleural effusion. No overt pulmonary edema. Streaky hazy bibasilar lung opacities, favor atelectasis. IMPRESSION: 1. Stable mild to moderate cardiomegaly without overt pulmonary edema. 2. Stable small right pleural effusion. 3. Streaky hazy bibasilar lung opacities, favor atelectasis. Electronically Signed   By: Ilona Sorrel M.D.   On: 02/03/2023 11:13    Cardiac Studies   [facility]  TAVR OPERATIVE NOTE     Date of Procedure:                01/27/2023   Preoperative Diagnosis:      Severe Aortic Stenosis    Postoperative Diagnosis:    Same    Procedure:        Transcatheter Aortic Valve Replacement - TransSubclavian Approach             Edwards Sapien 3 THV (size 23 mm, model # T562222, serial # [id number] )              Co-Surgeons:                        Lauree Chandler, MD and Gilford Raid , MD    Anesthesiologist:                  Tobias Alexander   Echocardiographer:              Croitoru   Pre-operative Echo Findings: Severe aortic stenosis Normal left ventricular systolic function   Post-operative Echo Findings: No paravalvular leak Normal  left ventricular systolic function   _____________   Echo 01/28/23:    1. Left ventricular ejection fraction, by estimation, is 65 to 70%. The  left ventricle has normal function. The left ventricle has no regional  wall motion abnormalities. There is mild concentric left ventricular  hypertrophy. Left ventricular diastolic  function could not be evaluated. There is the interventricular septum is  flattened in diastole ('D' shaped left ventricle), consistent with right  ventricular volume overload.   2. Right ventricular systolic function is mildly reduced. The right  ventricular size is moderately enlarged. There is mildly elevated  pulmonary artery systolic pressure. The estimated right ventricular  systolic  pressure is 43.3 mmHg.   3. Left atrial size was severely dilated.   4. Right atrial size was severely dilated.   5. The mitral valve is normal in structure. Mild mitral valve  regurgitation.   6. The tricuspid valve is abnormal. Tricuspid valve regurgitation is  severe.   7. The aortic valve has been repaired/replaced. Aortic valve  regurgitation is not visualized. There is a 23 mm Sapien prosthetic (TAVR)  valve present in the aortic position. Procedure Date: 01/27/2023. Echo  findings are consistent with normal structure  and function of the aortic valve prosthesis. Aortic valve mean gradient  measures 6.0 mmHg. Aortic valve Vmax measures 1.69 m/s. Aortic valve  acceleration time measures 53 msec.   8. The inferior vena cava is dilated in size with <50% respiratory  variability, suggesting right atrial pressure of 15 mmHg.   Comparison(s): Prior images reviewed side by side. Tricuspid insufficiency  is worse.   Patient Profile     JAYDN HAZEN is a 87 y.o. female with a hx of persistent atrial fibrillation, CAD with prior PCI, chronic diastolic CHF, HTN, HLD, DM diet controlled and severe aortic stenosis who recently underwent TAVR 01/27/23 and was discharged 01/29/23.      Assessment & Plan    Large peritoneal hematoma: Pt recently underwent TAVR 2/6 and discharged 2/8. Pt called today with severe right sided abdominal pain. CT imaging showed a large extraperitoneal hematoma measuring 20 x 13 x 6.5 cm. She was treated with pain medication and VVS was consulted. Hb remains stable at 9.5>>8.6>>8.6. Pain improved today. Plan to hold Eliquis at least 1 week possibly 2 weeks depending on her clinical progress.  She has follow up with [facility] team next week. Appreciate VVS following along. Will have her pre-medicated with Tylenol at the least prior to PT/OT. Hopeful for discharge soon once pain is better controlled with movement.    CAD s/p PCI: Pre TAVR R/LHC with patent pLAD and pRCA stents with no other occlusive disease noted. HsT on ED arrival noted to be elevated at 367. Denies anginal pain. EKG with lateral changes however noted on prior EKG tracings.    Severe AS: s/p successful TAVR with a 23 mm Edwards Sapien 3 THV via the [place] approach on 01/27/23. Post operative echo showed stable valve function with a mean gradient at 37mHg and AVA at 2.92cm2. Continue holding Eliquis as above.   Post op anemia: Post TAVR CBC returned with a Hb at 6.3 however no other overt s/s of acute bleeding. Patient received 2u PRBCs with improvement in Hb to 8.7.  Felt to be dilutional per Dr. BCyndia Bent Hb trending 9.5>>8.6>>8.6>>8.0.    Persistent atrial fibrillation: Eliquis held post TAVR until 2/8 due to anemia. Continue holding x 1-2 weeks.    Chronic diastolic CHF: Appears euvolic on exam however BNP elevated. Will given one dose Lasix today then can resume PRN dosing at discharge.    HTN: Stable   DM (diet controlled): SSI while inpatient.   Signed, JKathyrn Drown NP  02/05/2023, 9:10 AM  Pager [phone]  Patient seen, examined. Available data reviewed. Agree with findings, assessment, and plan as outlined by JKathyrn Drown NP.  Patient is independently interviewed and examined.   She is alert, oriented, elderly woman in no distress.  HEENT is normal, JVP is normal, lungs are clear bilaterally, heart is irregularly irregular with 2/6 ejection murmur at the right upper sternal border, abdomen is soft on the left side, but firm in  the right lower quadrant with associated tenderness to palpation.  Extremities have no edema.  Telemetry shows atrial fibrillation with controlled ventricular rate.  The patient appears stable without significant drop in her hemoglobin over the last 48 hours.  She remains off of apixaban.  We have arranged cardiology follow-up in 1 week and we can consider reinitiation of apixaban at that time pending her exam and clinical course.  Her pain seems to be improved and she is comfortable at rest.  She has significant pain with sitting upright or bending forward.  Otherwise she appears clinically stable for hospital discharge.  Appreciate care by vascular surgery and TRH teams.  Sherren Mocha, M.D. 02/05/2023 9:52 AM

## 2023-02-05 NOTE — Progress Notes (Signed)
Patient given discharge instructions and stated understanding.  Patient left via wheelchair.

## 2023-02-05 NOTE — TOC Transition Note (Signed)
Transition of Care (TOC) - CM/SW Discharge Note Marvetta Gibbons RN, BSN Transitions of Care Unit 4E- RN Case Manager See Treatment Team for direct phone #   Patient Details  Name: [name] MRN: [id number] Date of Birth: [birth year]  Transition of Care [facility]) CM/SW Contact:  Dawayne Patricia, RN Phone Number: 02/05/2023, 9:57 AM   Clinical Narrative:    Pt stable for transition home today, [place] orders have been placed for HHRN/PT/OT.  Pt was active with [facility] prior to admit for HHPT.  Call made to Angie with [facility] and confirmed that they will continue to service for [facility] needs- and are able to add RN/OT to previous PT orders. [facility] to f/u with pt for resumption of HH on discharge.   Cm spoke with pt and niece at bedside. Pt confirmed she wants to continue [facility] w/ [facility] and will be returning to her home with family support.  Niece reports she will be transporting pt home as her daughter had to work today.   No further TOC needs noted.    Final next level of care: Home w Home Health Services Barriers to Discharge: No Barriers Identified   Patient Goals and CMS Choice CMS Medicare.gov Compare Post Acute Care list provided to:: Patient Choice offered to / list presented to : Patient, Adult Children  Discharge Placement                 Home w/ [facility]        Discharge Plan and Services Additional resources added to the After Visit Summary for     Discharge Planning Services: CM Consult Post Acute Care Choice: Home Health, Resumption of Svcs/PTA Provider          DME Arranged: N/A DME Agency: NA       HH Arranged: RN, PT, OT HH Agency: [place] Date HH Agency Contacted: 02/05/23 Time [place]: [phone] Representative spoke with at [place]: [place] Determinants of Health (SDOH) Interventions SDOH Screenings   Food Insecurity: No Food Insecurity (01/30/2023)  Housing: Low Risk  (06/18/2022)  Transportation Needs: No  Transportation Needs (01/30/2023)  Alcohol Screen: Low Risk  (06/18/2022)  Depression (PHQ2-9): High Risk (08/20/2022)  Financial Resource Strain: Low Risk  (06/18/2022)  Physical Activity: Inactive (06/18/2022)  Social Connections: Moderately Integrated (06/18/2022)  Stress: No Stress Concern Present (06/18/2022)  Tobacco Use: Medium Risk (02/03/2023)     Readmission Risk Interventions    01/29/2023   10:27 AM  Readmission Risk Prevention Plan  Post Dischage Appt Complete  Medication Screening Complete  Transportation Screening Complete

## 2023-02-06 ENCOUNTER — Ambulatory Visit: Payer: Medicare Other

## 2023-02-06 ENCOUNTER — Telehealth: Payer: Self-pay

## 2023-02-06 NOTE — Transitions of Care (Post Inpatient/ED Visit) (Signed)
   02/06/2023  Name: [name] MRN: [id number] DOB: 19-Jun-1936  Today's TOC FU Call Status: Today's TOC FU Call Status:: Successful TOC FU Call Competed TOC FU Call Complete Date: 02/06/23  Transition Care Management Follow-up Telephone Call Date of Discharge: 02/05/23 Discharge Facility: [facility] Type of Discharge: Inpatient Admission Primary Inpatient Discharge Diagnosis:: Hematoma of Extraperitoneal [place], Pleural effusion How have you been since you were released from the hospital?: Better Any questions or concerns?: Yes Patient Questions/Concerns:: Spoke with daughter Colletta [place] who inquired about [facility] services and what patient getting. Per notes, RN,PT,OT Patient Questions/Concerns Addressed: Other: (This Probation officer answered daughters questionss)  Items Reviewed: Did you receive and understand the discharge instructions provided?: Yes Medications obtained and verified?: Yes (Medications Reviewed) Any new allergies since your discharge?: No Dietary orders reviewed?: No Do you have support at home?: Yes People in Home: child(ren), adult Name of Support/Comfort Primary Source: Marijo Conception  Home Care and Equipment/Supplies: [place] Ordered?: Yes Name of [place]:: [place] Has Agency set up a time to come to your home?: No EMR reviewed for [place] Orders: Orders present/patient has not received call (refer to CM for follow-up) (Patient already being seen by [facility]) Any new equipment or medical supplies ordered?: No  Functional Questionnaire: Do you need assistance with bathing/showering or dressing?: Yes Do you need assistance with meal preparation?: Yes Do you need assistance with eating?: No Do you have difficulty maintaining continence: No Do you need assistance with getting out of bed/getting out of a chair/moving?: Yes Do you have difficulty managing or taking your medications?: No  Folllow up appointments reviewed: PCP Follow-up  appointment confirmed?: [facility] Follow-up appointment confirmed?: Yes Date of Specialist follow-up appointment?: 02/13/23 Follow-Up Specialty Provider:: Kathyrn Drown NP, cardiology Do you need transportation to your follow-up appointment?: No Do you understand care options if your condition(s) worsen?: Yes-patient verbalized understanding  SDOH Interventions Today    Flowsheet Row Most Recent Value  SDOH Interventions   Food Insecurity Interventions Intervention Not Indicated  Housing Interventions Intervention Not Indicated       Johnney Killian, RN, BSN, CCM Care Management Coordinator [place] Phone: [phone]: [phone]

## 2023-02-08 DIAGNOSIS — I2510 Atherosclerotic heart disease of native coronary artery without angina pectoris: Secondary | ICD-10-CM | POA: Diagnosis not present

## 2023-02-08 DIAGNOSIS — E1159 Type 2 diabetes mellitus with other circulatory complications: Secondary | ICD-10-CM | POA: Diagnosis not present

## 2023-02-08 DIAGNOSIS — T8189XD Other complications of procedures, not elsewhere classified, subsequent encounter: Secondary | ICD-10-CM | POA: Diagnosis not present

## 2023-02-08 DIAGNOSIS — I159 Secondary hypertension, unspecified: Secondary | ICD-10-CM | POA: Diagnosis not present

## 2023-02-08 DIAGNOSIS — I5032 Chronic diastolic (congestive) heart failure: Secondary | ICD-10-CM | POA: Diagnosis not present

## 2023-02-08 DIAGNOSIS — I4819 Other persistent atrial fibrillation: Secondary | ICD-10-CM | POA: Diagnosis not present

## 2023-02-11 DIAGNOSIS — I4819 Other persistent atrial fibrillation: Secondary | ICD-10-CM | POA: Diagnosis not present

## 2023-02-11 DIAGNOSIS — E1159 Type 2 diabetes mellitus with other circulatory complications: Secondary | ICD-10-CM | POA: Diagnosis not present

## 2023-02-11 DIAGNOSIS — T8189XD Other complications of procedures, not elsewhere classified, subsequent encounter: Secondary | ICD-10-CM | POA: Diagnosis not present

## 2023-02-11 DIAGNOSIS — I159 Secondary hypertension, unspecified: Secondary | ICD-10-CM | POA: Diagnosis not present

## 2023-02-11 DIAGNOSIS — I2510 Atherosclerotic heart disease of native coronary artery without angina pectoris: Secondary | ICD-10-CM | POA: Diagnosis not present

## 2023-02-11 DIAGNOSIS — I5032 Chronic diastolic (congestive) heart failure: Secondary | ICD-10-CM | POA: Diagnosis not present

## 2023-02-12 NOTE — Progress Notes (Signed)
HE[facility] [place]                                     Cardiology Office Note:    Date:  02/13/2023   ID:  [name], DOB 04/12/1936, MRN [id number]  PCP:  Baruch Gouty, [facility] Cardiologist:  Glenetta Hew, MD / Dr. Angelena Form, MD and Dr. Cyndia Bent, MD (TAVR)  [facility] Electrophysiologist:  None   Referring MD: Baruch Gouty, FNP   Chief Complaint  Patient presents with   Follow-up    TOC s/p TAVR; hospital follow up   History of Present Illness:    [name] is a 87 y.o. female with a hx of  persistent atrial fibrillation, CAD with prior PCI, chronic diastolic CHF, HTN, HLD, DM diet controlled and severe aortic stenosis who presented to [name] for planned TAVR.    [name] has CAD and had a bare metal stent placed in the RCA in 1999 and another distal bare metal stent placed in the distal RCA in 2012. Last cardiac cath in 2012 with severe in stent restenosis in distal RCA stent treated with cutting balloon angioplasty. She has been followed over the past year for severe aortic stenosis. Echo 11/2021 with normal LV function, mild MR and severe low flow/low gradient AS with mean gradient 27 mmHg, AVA 0.8 cm2. She was called at that time and referral to the structural heart clinic was made but she had other family issues and did not schedule this appt. Most recent echo 06/20/22 with LVEF=65-70%, mild MR as well as severe low flow/low gradient AS with mean gradient 27 mmHg, AVA 0.72 cm2, DI 0.23, SVI 34. She had a recent upper endoscopy and was found to have gastritis with no ulcer.     She was seen by Dr. Angelena Form in the structural heart clinic for TAVR work up on 10/06/22. Plan was to proceed with [facility] but the patient wanted to go home and discuss this with her family. She eventually underwent cardiac catheterization which showed non-obstructive CAD with widely patent bare-metal stents in the mid and distal RCA,  and trivial disease in the LCx system.   CT imaging performed with no preclusive issues. The patient was evaluated by the multidisciplinary valve team and felt to have severe, symptomatic aortic stenosis and to be a suitable candidate.    She is now s/p successful TAVR with a 23 mm Edwards Sapien 3 THV via the [place] approach on 01/27/23. Post operative echo showed stable valve function with worsening TR however likely due to acute volume overload with PRBCs. She had post op anemia with a Hb at 6.3 however no other overt s/s of acute bleeding. Patient received 2u PRBCs with improvement in Hb to 8.7.  Felt to be dilutional per Dr. Cyndia Bent. Hb trended 9.5>>8.6>>8.6. She was kept additional time due to this and was ultimately discharged home with her daughter.    She then called the office with severe right sided abdominal pain. CT imaging showed a large extraperitoneal hematoma measuring 20 x 13 x 6.5 cm. She was treated with pain medication and VVS was consulted with no surgical recommendations. Plan was to hold Eliquis at least 1 week possibly 2 weeks depending on her clinical progress.    Today she is here and although fatigued, she feels much better in terms of  her SOB and LE edema post TAVR. She has been working with PT in her home and is tolerating this well. Her groin pain is gone and will only occasionally need to use Tylenol. EKG today shows rated controlled AF. We discussed when to restart her Eliquis. 2 weeks will be 2/29 however I would like to see where her Hb is before committing to a restart date. Dr. Donzetta Matters did not indicate repeating imaging. She denies chest pain, palpitations, SOB, LE edema, orthopnea, dizziness, or syncope. No bleeding in stool or urine.    Past Medical History:  Diagnosis Date   A-fib [name])    Anxiety    CAD S/P percutaneous coronary angioplasty 1990s, 11/28/2011    a) midRCA PCI 1999 (NIR BMS 3.0 mm x 32 mm); distal  RCA BMS Sept 2012 (Vision BMS 2.5 mm x 15 mm); 12/02: RCA  ISR --> Cutting PTCA    Coronary stent restenosis due to progression of disease 11/30/2011   Dyslipidemia, goal LDL below 70 11/28/2011   Essential hypertension    On ACE inhibitor and ARB? As well as atenolol and hydralazine   Gastric ulcer 11/2018   GERD (gastroesophageal reflux disease)    History of blood transfusion    without reaction   Moderate aortic stenosis by prior echocardiogram 08/2011; 08/2013   Echo 11/09/18: EF 60-65%. Gr 2 DD.  No RWMA.  Mild-Mod AS (mean gradient 19 mmHg). Mild-Mod PAH. 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 MAngelena Formand Dr. BCyndia Bent  Type 2 diabetes mellitus with complication, without long-term current use of insulin [place]    no longer taking diabetic medication - diet controlled    Past Surgical History:  Procedure Laterality Date   ABDOMINAL HYSTERECTOMY     BIOPSY  06/03/2018   Procedure: BIOPSY;  Surgeon: RRogene Houston MD;  [facility];  Service: Endoscopy;;  gastric    BIOPSY  09/15/2022   Procedure: BIOPSY;  Surgeon: CEloise Harman DO;  [facility];  Service: Endoscopy;;   BREAST BIOPSY     CARDIAC CATHETERIZATION  2012   performed for angina   CARDIAC CATHETERIZATION     CARDIOVERSION N/A 10/24/2020   Procedure: CARDIOVERSION;  Surgeon: SJerline Pain MD;  [facility];  Service: Cardiovascular;  Laterality: N/A;   CORONARY ANGIOPLASTY  11/2006   Cutting Balloon of stent in the RCA   ESOPHAGOGASTRODUODENOSCOPY N/A 06/03/2018   Procedure: ESOPHAGOGASTRODUODENOSCOPY (EGD);  Surgeon: RRogene Houston MD;  [facility]  END[facility];  Service: Endoscopy;  Laterality: N/A;  1:40   ESOPHAGOGASTRODUODENOSCOPY N/A 12/02/2018   Procedure: ESOPHAGOGASTRODUODENOSCOPY (EGD);  Surgeon: RRogene Houston MD;  [facility];  Service: Endoscopy;  Laterality: N/A;  255   ESOPHAGOGASTRODUODENOSCOPY (EGD) WITH PROPOFOL N/A 09/15/2022   Procedure: ESOPHAGOGASTRODUODENOSCOPY (EGD) WITH PROPOFOL;  Surgeon: CEloise Harman DO;  [facility];  Service: Endoscopy;  Laterality: N/A;  1:00pm, asa 3   LEFT HEART CATHETERIZATION WITH CORONARY ANGIOGRAM N/A 11/27/2011   Procedure: LEFT HEART CATHETERIZATION WITH CORONARY ANGIOGRAM;  Surgeon: DLeonie Man  MD;  Location: [place] C[facility] LAB;  Service: Cardiovascular;  Laterality: N/A;   PERCUTANEOUS CORONARY STENT INTERVENTION (PCI-S)  1999   Mid RCA - NIR DMS 3.0 mm x 30 mm, dRCA Vision BMS 2.5 mm x 15 mm    RIGHT HEART CATH AND CORONARY ANGIOGRAPHY N/A 11/24/2022   Procedure: RIGHT HEART CATH AND CORONARY ANGIOGRAPHY;  Surgeon: Leonie Man, MD;  Location: [place] CV LAB;  Service: Cardiovascular;  Laterality: N/A;   TEE WITHOUT CARDIOVERSION N/A 01/27/2023   Procedure: TRANSESOPHAGEAL ECHOCARDIOGRAM;  Surgeon: Burnell Blanks, MD;  Location: [place];  Service: Open Heart Surgery;  Laterality: N/A;   TRANSTHORACIC ECHOCARDIOGRAM  10/2018   EF 60-65%. Gr 2 DD.  No RWMA.  Mild-Mod AS  (mean gradient 19 mmHg). Mild-Mod PAH.   TRANSTHORACIC ECHOCARDIOGRAM  03/01/2020   EF 70 to 75%.  No or WMA.  Mild LVH.  GRII DD with severe LA enlargement.  Mildly elevated PAP.  Mild MR.  Moderate AS (mean gradient 17 mmHg) with dimensionless index of 0.32.   TUBAL LIGATION      Current Medications: Current Meds  Medication Sig   atorvastatin (LIPITOR) 20 MG tablet TAKE ONE (1) TABLET EACH DAY   carvedilol (COREG) 25 MG tablet TAKE ONE TABLET BY MOUTH TWICE DAILY   diphenhydramine-acetaminophen (TYLENOL PM) 25-500 MG TABS tablet Take 2 tablets by mouth at bedtime.    furosemide (LASIX) 40 MG tablet Take 1 tablet (40 mg total) by mouth daily as needed for fluid.   hydrALAZINE (APRESOLINE) 100 MG tablet TAKE ONE (1) TABLET BY MOUTH TWO (2) TIMES DAILY   loperamide (IMODIUM A-D) 2 MG tablet Take 2 mg by mouth 4 (four) times daily as needed for diarrhea or loose stools.   NITROSTAT 0.4 MG SL tablet DISSOLVE 1 TAB UNDER TOUNGE FOR CHEST PAIN. MAY REPEAT EVERY 5 MINUTES FOR 3 DOSES. IF NO RELIEF CALL 911 OR GO TO ER   pantoprazole (PROTONIX) 40 MG tablet TAKE 1 TABLET BY MOUTH TWICE DAILY BEFORE MEALS   amoxicillin (AMOXIL) 500 MG capsule Take 4 capsules (2,000 mg total) by mouth as directed. Take 4 tablets 1 hour prior to dental work, including cleanings.     Allergies:   Valium   Social History   Socioeconomic History   Marital status: Widowed    Spouse name: Carloyn Manner   Number of children: 3   Years of education: Not on file   Highest education level: Not on file  Occupational History   Occupation: Retired Marine scientist  Tobacco Use   Smoking status: Former   Smokeless tobacco: Never   Tobacco comments:    Quit approximately 1993  Vaping Use   Vaping Use: Never used  Substance and Sexual Activity   Alcohol use: No   Drug use: No   Sexual activity: Not Currently    Birth control/protection: Post-menopausal  Other Topics Concern   Not on file  Social History Narrative   She is a married mother of 54, 2 daughters, 1 son who all live out of state but talks to weekly.   Step-son and his family live next door. Sees often.    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    Quit smoking in '99, denies alcohol.   She volunteers for [employer].   Social Determinants of Health   Financial Resource Strain: Low Risk  (06/18/2022)   Overall Financial Resource Strain (CARDIA)    Difficulty of Paying Living Expenses: Not  hard at all  Food Insecurity: No Food Insecurity (02/06/2023)   Hunger Vital Sign    Worried About Running Out of Food in the Last Year:  Never true    Ran Out of Food in the Last Year: Never true  Transportation Needs: No Transportation Needs (01/30/2023)   PRAPARE - Hydrologist (Medical): No    Lack of Transportation (Non-Medical): No  Physical Activity: Inactive (06/18/2022)   Exercise Vital Sign    Days of Exercise per Week: 0 days    Minutes of Exercise per Session: 0 min  Stress: No Stress Concern Present (06/18/2022)   [place]    Feeling of Stress : Only a little  Social Connections: Moderately Integrated (06/18/2022)   Social Connection and Isolation Panel [NHANES]    Frequency of Communication with Friends and Family: More than three times a week    Frequency of Social Gatherings with Friends and Family: More than three times a week    Attends Religious Services: 1 to 4 times per year    Active Member of [employer] or Organizations: No    Attends Music therapist: Never    Marital Status: Married     Family History: The patient's family history includes Cancer in her mother; Heart Problems in her brother; Heart disease in her brother, father, maternal grandfather, and maternal grandmother; Nephrolithiasis in her brother.  ROS:   Please see the history of present illness.    All other systems reviewed and are negative.  EKGs/Labs/Other Studies Reviewed:    The following studies were reviewed today:   CT 02/03/23:  IMPRESSION: 1. Surgical changes from recent TAVR. No complicating features are identified. 2. Large extraperitoneal abdominal/pelvic hematoma on the right side. It measures approximately 20 x 13 x 6.5 cm. 3. Moderate-sized bilateral pleural effusions, right larger than left with overlying atelectasis. 4. Advanced vascular disease but no aneurysm, dissection or significant stenosis. 5. Suspect changes of hepatic congestion given the amount of reflux of contrast down the IVC and into the  hepatic veins. 6. Moderate amount of free abdominal/pelvic fluid. 7. Diffuse body wall edema.   These results will be called to the ordering clinician or representative by the Radiologist Assistant, and communication documented in the PACS or [employer].    [facility]  TAVR OPERATIVE NOTE     Date of Procedure:                01/27/2023   Preoperative Diagnosis:      Severe Aortic Stenosis    Postoperative Diagnosis:    Same    Procedure:        Transcatheter Aortic Valve Replacement - TransSubclavian Approach             Edwards Sapien 3 THV (size 23 mm, model # Q151231, serial # [id number] )              Co-Surgeons:                        Lauree Chandler, MD and Gilford Raid , MD    Anesthesiologist:                  Tobias Alexander   Echocardiographer:              Croitoru   Pre-operative Echo Findings: Severe aortic stenosis Normal left ventricular systolic function   Post-operative  Echo Findings: No paravalvular leak Normal left ventricular systolic function   _____________   Echo 01/28/23:    1. Left ventricular ejection fraction, by estimation, is 65 to 70%. The  left ventricle has normal function. The left ventricle has no April  wall motion abnormalities. There is mild concentric left ventricular  hypertrophy. Left ventricular diastolic  function could not be evaluated. There is the interventricular septum is  flattened in diastole ('D' shaped left ventricle), consistent with right  ventricular volume overload.   2. Right ventricular systolic function is mildly reduced. The right  ventricular size is moderately enlarged. There is mildly elevated  pulmonary artery systolic pressure. The estimated right ventricular  systolic pressure is 0000000 mmHg.   3. Left atrial size was severely dilated.   4. Right atrial size was severely dilated.   5. The mitral valve is normal in structure. Mild mitral valve  regurgitation.   6. The tricuspid  valve is abnormal. Tricuspid valve regurgitation is  severe.   7. The aortic valve has been repaired/replaced. Aortic valve  regurgitation is not visualized. There is a 23 mm Sapien prosthetic (TAVR)  valve present in the aortic position. Procedure Date: 01/27/2023. Echo  findings are consistent with normal structure  and function of the aortic valve prosthesis. Aortic valve mean gradient  measures 6.0 mmHg. Aortic valve Vmax measures 1.69 m/s. Aortic valve  acceleration time measures 53 msec.   8. The inferior vena cava is dilated in size with <50% respiratory  variability, suggesting right atrial pressure of 15 mmHg.   Comparison(s): Prior images reviewed side by side. Tricuspid insufficiency  is worse.   EKG:  EKG is ordered today.  The ekg ordered today demonstrates AF with HR 89bpm  Recent Labs: 02/03/2023: ALT 8; B Natriuretic Peptide 760.6 02/04/2023: BUN 11; Creatinine, Ser 0.78; Magnesium 2.0; Platelets 161; Potassium 4.0; Sodium 137 02/05/2023: Hemoglobin 8.0   Recent Lipid Panel    Component Value Date/Time   CHOL 137 03/19/2022 1358   TRIG 96 03/19/2022 1358   HDL 69 03/19/2022 1358   CHOLHDL 2.0 03/19/2022 1358   LDLCALC 50 03/19/2022 1358   Physical Exam:    VS:  BP (!) 160/58   Pulse 89   Ht '5\' 5"'$  (1.651 m)   Wt 131 lb 6.4 oz (59.6 kg)   SpO2 98%   BMI 21.87 kg/m     Wt Readings from Last 3 Encounters:  02/13/23 131 lb 6.4 oz (59.6 kg)  02/05/23 143 lb 8.3 oz (65.1 kg)  01/29/23 139 lb (63 kg)    General: Well developed, well nourished, NAD Lungs:Clear to ausculation bilaterally. No wheezes, rales, or rhonchi. Breathing is unlabored. Cardiovascular: Irregularly irregular. No murmurs Abdomen: Soft, non-tender, non-distended. No obvious abdominal masses. Extremities: No edema.  Neuro: Alert and oriented. No focal deficits. No facial asymmetry. MAE spontaneously. Psych: Responds to questions appropriately with normal affect.     ASSESSMENT/PLAN:     Severe AS: s/p successful TAVR with a 23 mm Edwards Sapien 3 THV via the [place] approach on 01/27/23. Post operative echo showed stable valve function with worsening TR however likely due to acute volume overload with PRBCs. We will follow with one month post TAVR echo. Groin sites stable with soft abdomen today. Continue to hold Eliquis through at least 2/29 although we will recheck CBC today before making that decision. She will require lifelong dental SBE with amoxicillin. Plan one month follow up with echo.    Large peritoneal hematoma: Called  the office with acute abdominal pain post TAVR. CT imaging showed a large extraperitoneal hematoma measuring 20 x 13 x 6.5 cm. She was treated with pain medication and VVS was consulted. As above, continue to hold Eliquis at least through 2/29 however will obtain CBC today before making that change.    CAD s/p PCI: Pre TAVR R/LHC with patent pLAD and pRCA stents with no other occlusive disease noted. Denies anginal symptoms.     Post op anemia: Post TAVR CBC returned with a Hb at 6.3 with no other overt s/s of acute bleeding. Patient received 2u PRBCs with improvement in Hb to 8.7.  Felt to be dilutional per Dr. Cyndia Bent. Hb trending 9.5>>8.6>>8.6. Obtain CBC today.    Persistent atrial fibrillation: Eliquis held post TAVR until 2/29 due to anemia. EKG with AF with rate control today.    Chronic diastolic CHF: Appears euvolic on exam    HTN: Elevated today but reports BP readings at home WNL.   { Medication Adjustments/Labs and Tests Ordered: Current medicines are reviewed at length with the patient today.  Concerns regarding medicines are outlined above.  Orders Placed This Encounter  Procedures   CBC   EKG 12-Lead   Meds ordered this encounter  Medications   amoxicillin (AMOXIL) 500 MG capsule    Sig: Take 4 capsules (2,000 mg total) by mouth as directed. Take 4 tablets 1 hour prior to dental work, including cleanings.    Dispense:  12 capsule     Refill:  12    Order Specific Question:   Supervising Provider    Answer:   Sherren Mocha [id number]    Patient Instructions  Medication Instructions:  Your physician recommends that you continue on your current medications as directed. Please refer to the Current Medication list given to you today.  *If you need a refill on your cardiac medications before your next appointment, please call your pharmacy*   Lab Work: TODAY: CBC If you have labs (blood work) drawn today and your tests are completely normal, you will receive your results only by: [place] (if you have MyChart) OR A paper copy in the mail If you have any lab test that is abnormal or we need to change your treatment, we will call you to review the results.   Testing/Procedures: NONE   Follow-Up: At [facility], you and your health needs are our priority.  As part of our continuing mission to provide you with exceptional heart care, we have created designated Provider Care Teams.  These Care Teams include your primary Cardiologist (physician) and Advanced Practice Providers (APPs -  Physician Assistants and Nurse Practitioners) who all work together to provide you with the care you need, when you need it.  We recommend signing up for the patient portal called "MyChart".  Sign up information is provided on this After Visit Summary.  MyChart is used to connect with patients for Virtual Visits (Telemedicine).  Patients are able to view lab/test results, encounter notes, upcoming appointments, etc.  Non-urgent messages can be sent to your provider as well.   To learn more about what you can do with MyChart, go to [email or web address].    Your next appointment:   KEEP SCHEDULED FOLLOW-UP    Signed, Kathyrn Drown, NP  02/13/2023 1:05 PM    [facility]

## 2023-02-13 ENCOUNTER — Ambulatory Visit: Payer: Medicare Other | Attending: Cardiology | Admitting: Cardiology

## 2023-02-13 VITALS — BP 160/58 | HR 89 | Ht 65.0 in | Wt 131.4 lb

## 2023-02-13 DIAGNOSIS — K683 Retroperitoneal hematoma: Secondary | ICD-10-CM | POA: Diagnosis not present

## 2023-02-13 DIAGNOSIS — D62 Acute posthemorrhagic anemia: Secondary | ICD-10-CM | POA: Diagnosis not present

## 2023-02-13 DIAGNOSIS — I2510 Atherosclerotic heart disease of native coronary artery without angina pectoris: Secondary | ICD-10-CM | POA: Diagnosis not present

## 2023-02-13 DIAGNOSIS — I350 Nonrheumatic aortic (valve) stenosis: Secondary | ICD-10-CM | POA: Diagnosis not present

## 2023-02-13 DIAGNOSIS — Z9861 Coronary angioplasty status: Secondary | ICD-10-CM

## 2023-02-13 DIAGNOSIS — I10 Essential (primary) hypertension: Secondary | ICD-10-CM

## 2023-02-13 DIAGNOSIS — Z952 Presence of prosthetic heart valve: Secondary | ICD-10-CM

## 2023-02-13 DIAGNOSIS — D649 Anemia, unspecified: Secondary | ICD-10-CM | POA: Diagnosis not present

## 2023-02-13 LAB — CBC
Hematocrit: 29.1 % — ABNORMAL LOW (ref 34.0–46.6)
Hemoglobin: 9.5 g/dL — ABNORMAL LOW (ref 11.1–15.9)
MCH: 28.8 pg (ref 26.6–33.0)
MCHC: 32.6 g/dL (ref 31.5–35.7)
MCV: 88 fL (ref 79–97)
Platelets: 257 10*3/uL (ref 150–450)
RBC: 3.3 x10E6/uL — ABNORMAL LOW (ref 3.77–5.28)
RDW: 17.5 % — ABNORMAL HIGH (ref 11.7–15.4)
WBC: 5.9 10*3/uL (ref 3.4–10.8)

## 2023-02-13 MED ORDER — AMOXICILLIN 500 MG PO CAPS: 2000.0000 mg | ORAL_CAPSULE | ORAL | 12 refills | Status: DC

## 2023-02-13 NOTE — Patient Instructions (Addendum)
Medication Instructions:  Your physician recommends that you continue on your current medications as directed. Please refer to the Current Medication list given to you today.  *If you need a refill on your cardiac medications before your next appointment, please call your pharmacy*   Lab Work: TODAY: CBC If you have labs (blood work) drawn today and your tests are completely normal, you will receive your results only by: [place] (if you have MyChart) OR A paper copy in the mail If you have any lab test that is abnormal or we need to change your treatment, we will call you to review the results.   Testing/Procedures: NONE   Follow-Up: At [facility], you and your health needs are our priority.  As part of our continuing mission to provide you with exceptional heart care, we have created designated Provider Care Teams.  These Care Teams include your primary Cardiologist (physician) and Advanced Practice Providers (APPs -  Physician Assistants and Nurse Practitioners) who all work together to provide you with the care you need, when you need it.  We recommend signing up for the patient portal called "MyChart".  Sign up information is provided on this After Visit Summary.  MyChart is used to connect with patients for Virtual Visits (Telemedicine).  Patients are able to view lab/test results, encounter notes, upcoming appointments, etc.  Non-urgent messages can be sent to your provider as well.   To learn more about what you can do with MyChart, go to [email or web address].    Your next appointment:   KEEP SCHEDULED FOLLOW-UP

## 2023-02-13 NOTE — Progress Notes (Signed)
[facility] [place]                                     Cardiology Office Note:    Date:  02/13/2023   ID:  [name], DOB 1936-06-25, MRN [id number]  PCP:  Baruch Gouty, [place] [facility] Cardiologist:  Glenetta Hew, MD / Dr. Angelena Form, MD and Dr. Cyndia Bent, MD (TAVR)  [facility] Electrophysiologist:  None   Referring MD: Baruch Gouty, FNP   Chief Complaint  Patient presents with   Follow-up    TOC s/p TAVR; hospital follow up   History of Present Illness:    [name] is a 87 y.o. female with a hx of  persistent atrial fibrillation, CAD with prior PCI, chronic diastolic CHF, HTN, HLD, DM diet controlled and severe aortic stenosis who presented to [facility] for planned TAVR and is seen today for TOC follow up.    [name] has CAD and had a bare metal stent placed in the RCA in 1999 and another distal bare metal stent placed in the distal RCA in 2012. Last cardiac cath in 2012 with severe in stent restenosis in distal RCA stent treated with cutting balloon angioplasty. She has been followed over the past year for severe aortic stenosis. Echo 11/2021 with normal LV function, mild MR and severe low flow/low gradient AS with mean gradient 27 mmHg, AVA 0.8 cm2. She was called at that time and referral to the structural heart clinic was made but she had other family issues and did not schedule this appt. Most recent echo 06/20/22 with LVEF=65-70%, mild MR as well as severe low flow/low gradient AS with mean gradient 27 mmHg, AVA 0.72 cm2, DI 0.23, SVI 34. She had a recent upper endoscopy and was found to have gastritis with no ulcer.     She was seen by Dr. Angelena Form in the structural heart clinic for TAVR work up on 10/06/22. Plan was to proceed with [facility] but the patient wanted to go home and discuss this with her family. She eventually underwent cardiac catheterization which showed non-obstructive CAD with widely patent bare-metal  stents in the mid and distal RCA, and trivial disease in the LCx system.    CT imaging performed with no preclusive issues. The patient was evaluated by the multidisciplinary valve team and felt to have severe, symptomatic aortic stenosis and to be a suitable candidate.   She is now s/p successful TAVR with a 23 mm Edwards Sapien 3 THV via the  approach on 01/27/23. Post operative echo showed stable valve function with worsening TR however likely due to acute volume overload with PRBCs. She had post op anemia with a Hb at 6.3 however no other overt s/s of acute bleeding. Patient received 2u PRBCs with improvement in Hb to 8.7.  Felt to be dilutional per Dr. Cyndia Bent. Hb trended 9.5>>8.6>>8.6. She was kept additional time due to this and was ultimately discharged home with her daughter.    She then called the office with severe right sided abdominal pain. CT imaging showed a large extraperitoneal hematoma measuring 20 x 13 x 6.5 cm. She was treated with pain medication and VVS was consulted with no surgical recommendations. Plan was to hold Eliquis at least 1 week possibly 2 weeks depending on her clinical progress.   Today she is here and although fatigued,  she feels much better in terms of her SOB and LE edema post TAVR. She has been working with PT in her home and is tolerating this well. Her groin pain is gone and will only occasionally need to use Tylenol. EKG today shows rated controlled AF. We discussed when to restart her Eliquis. 2 weeks will be 2/29 however I would like to see where her Hb is before committing to a restart date. Dr. Donzetta Matters did not indicate repeating imaging. She denies chest pain, palpitations, SOB, LE edema, orthopnea, dizziness, or syncope. No bleeding in stool or urine.   Past Medical History:  Diagnosis Date   A-fib [facility])    Anxiety    CAD S/P percutaneous coronary angioplasty 1990s, 11/28/2011    a) midRCA PCI 1999 (NIR BMS 3.0 mm x 32 mm); distal  RCA BMS Sept 2012 (Vision  BMS 2.5 mm x 15 mm); 12/02: RCA ISR --> Cutting PTCA    Coronary stent restenosis due to progression of disease 11/30/2011   Dyslipidemia, goal LDL below 70 11/28/2011   Essential hypertension    On ACE inhibitor and ARB? As well as atenolol and hydralazine   Gastric ulcer 11/2018   GERD (gastroesophageal reflux disease)    History of blood transfusion    without reaction   Moderate aortic stenosis by prior echocardiogram 08/2011; 08/2013   Echo 11/09/18: EF 60-65%. Gr 2 DD.  No RWMA.  Mild-Mod AS (mean gradient 19 mmHg). Mild-Mod PAH. 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 MAngelena Formand Dr. BCyndia Bent  Type 2 diabetes mellitus with complication, without long-term current use of insulin ([place]    no longer taking diabetic medication - diet controlled    Past Surgical History:  Procedure Laterality Date   ABDOMINAL HYSTERECTOMY     BIOPSY  06/03/2018   Procedure: BIOPSY;  Surgeon: RRogene Houston MD;  [facility];  Service: Endoscopy;;  gastric    BIOPSY  09/15/2022   Procedure: BIOPSY;  Surgeon: CEloise Harman DO;  [facility];  Service: Endoscopy;;   BREAST BIOPSY     CARDIAC CATHETERIZATION  2012   performed for angina   CARDIAC CATHETERIZATION     CARDIOVERSION N/A 10/24/2020   Procedure: CARDIOVERSION;  Surgeon: SJerline Pain MD;  [facility];  Service: Cardiovascular;  Laterality: N/A;   CORONARY ANGIOPLASTY  11/2006   Cutting Balloon of stent in the RCA   ESOPHAGOGASTRODUODENOSCOPY N/A 06/03/2018   Procedure: ESOPHAGOGASTRODUODENOSCOPY (EGD);  Surgeon:  RRogene Houston MD;  [facility];  Service: Endoscopy;  Laterality: N/A;  1:40   ESOPHAGOGASTRODUODENOSCOPY N/A 12/02/2018   Procedure: ESOPHAGOGASTRODUODENOSCOPY (EGD);  Surgeon: RRogene Houston MD;  [facility];  Service: Endoscopy;  Laterality: N/A;  255   ESOPHAGOGASTRODUODENOSCOPY (EGD) WITH PROPOFOL N/A 09/15/2022   Procedure: ESOPHAGOGASTRODUODENOSCOPY (EGD) WITH PROPOFOL;  Surgeon: CEloise Harman DO;  [facility];  Service: Endoscopy;  Laterality: N/A;  1:00pm, asa 3   LEFT HEART CATHETERIZATION WITH CORONARY ANGIOGRAM N/A 11/27/2011   Procedure: LEFT HEART CATHETERIZATION WITH CORONARY  Cyril Loosen;  Surgeon: Leonie Man, MD;  Location: [facility] CATH LAB;  Service: Cardiovascular;  Laterality: N/A;   PERCUTANEOUS CORONARY STENT INTERVENTION (PCI-S)  1999   Mid RCA - NIR DMS 3.0 mm x 30 mm, dRCA Vision BMS 2.5 mm x 15 mm    RIGHT HEART CATH AND CORONARY ANGIOGRAPHY N/A 11/24/2022   Procedure: RIGHT HEART CATH AND CORONARY ANGIOGRAPHY;  Surgeon: Leonie Man, MD;  Location: [place] CV LAB;  Service: Cardiovascular;  Laterality: N/A;   TEE WITHOUT CARDIOVERSION N/A 01/27/2023   Procedure: TRANSESOPHAGEAL ECHOCARDIOGRAM;  Surgeon: Burnell Blanks, MD;  Location: [place];  Service: Open Heart Surgery;  Laterality: N/A;   TRANSTHORACIC ECHOCARDIOGRAM  10/2018   EF 60-65%. Gr 2 DD.  No RWMA.  Mild-Mod AS  (mean gradient 19 mmHg). Mild-Mod PAH.   TRANSTHORACIC ECHOCARDIOGRAM  03/01/2020   EF 70 to 75%.  No or WMA.  Mild LVH.  GRII DD with severe LA enlargement.  Mildly elevated PAP.  Mild MR.  Moderate AS (mean gradient 17 mmHg) with dimensionless index of 0.32.   TUBAL LIGATION      Current Medications: Current Meds  Medication Sig   atorvastatin (LIPITOR) 20 MG tablet TAKE ONE (1) TABLET EACH DAY   carvedilol (COREG) 25 MG tablet TAKE ONE TABLET BY MOUTH TWICE DAILY   diphenhydramine-acetaminophen (TYLENOL PM) 25-500 MG TABS tablet Take 2  tablets by mouth at bedtime.   furosemide (LASIX) 40 MG tablet Take 1 tablet (40 mg total) by mouth daily as needed for fluid.   hydrALAZINE (APRESOLINE) 100 MG tablet TAKE ONE (1) TABLET BY MOUTH TWO (2) TIMES DAILY   loperamide (IMODIUM A-D) 2 MG tablet Take 2 mg by mouth 4 (four) times daily as needed for diarrhea or loose stools.   NITROSTAT 0.4 MG SL tablet DISSOLVE 1 TAB UNDER TOUNGE FOR CHEST PAIN. MAY REPEAT EVERY 5 MINUTES FOR 3 DOSES. IF NO RELIEF CALL 911 OR GO TO ER   pantoprazole (PROTONIX) 40 MG tablet TAKE 1 TABLET BY MOUTH TWICE DAILY BEFORE MEALS   amoxicillin (AMOXIL) 500 MG capsule Take 4 capsules (2,000 mg total) by mouth as directed. Take 4 tablets 1 hour prior to dental work, including cleanings.     Allergies:   Valium   Social History   Socioeconomic History   Marital status: Widowed    Spouse name: Carloyn Manner   Number of children: 3   Years of education: Not on file   Highest education level: Not on file  Occupational History   Occupation: Retired Marine scientist  Tobacco Use   Smoking status: Former   Smokeless tobacco: Never   Tobacco comments:    Quit approximately 1993  Vaping Use   Vaping Use: Never used  Substance and Sexual Activity   Alcohol use: No   Drug use: No   Sexual activity: Not Currently    Birth control/protection: Post-menopausal  Other Topics Concern   Not on file  Social History Narrative   She is a married mother of 37, 2 daughters, 1 son who all live out of state but talks to weekly.   Step-son and his family live next door. Sees often.    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    Quit smoking in '99, denies alcohol.   She volunteers for [employer].   Social Determinants of Health   Financial Resource Strain: Low Risk  (06/18/2022)   Overall Financial Resource Strain (CARDIA)  Difficulty of Paying Living Expenses: Not hard at all  Food Insecurity: No Food Insecurity (02/06/2023)   Hunger Vital Sign    Worried About Running  Out of Food in the Last Year: Never true    Ran Out of Food in the Last Year: Never true  Transportation Needs: No Transportation Needs (01/30/2023)   PRAPARE - Hydrologist (Medical): No    Lack of Transportation (Non-Medical): No  Physical Activity: Inactive (06/18/2022)   Exercise Vital Sign    Days of Exercise per Week: 0 days    Minutes of Exercise per Session: 0 min  Stress: No Stress Concern Present (06/18/2022)   [place]    Feeling of Stress : Only a little  Social Connections: Moderately Integrated (06/18/2022)   Social Connection and Isolation Panel [NHANES]    Frequency of Communication with Friends and Family: More than three times a week    Frequency of Social Gatherings with Friends and Family: More than three times a week    Attends Religious Services: 1 to 4 times per year    Active Member of [employer] or Organizations: No    Attends Music therapist: Never    Marital Status: Married     Family History: The patient's family history includes Cancer in her mother; Heart Problems in her brother; Heart disease in her brother, father, maternal grandfather, and maternal grandmother; Nephrolithiasis in her brother.  ROS:   Please see the history of present illness.    All other systems reviewed and are negative.  EKGs/Labs/Other Studies Reviewed:    The following studies were reviewed today:  CT 02-09-2023:  IMPRESSION: 1. Surgical changes from recent TAVR. No complicating features are identified. 2. Large extraperitoneal abdominal/pelvic hematoma on the right side. It measures approximately 20 x 13 x 6.5 cm. 3. Moderate-sized bilateral pleural effusions, right larger than left with overlying atelectasis. 4. Advanced vascular disease but no aneurysm, dissection or significant stenosis. 5. Suspect changes of hepatic congestion given the amount of reflux of contrast  down the IVC and into the hepatic veins. 6. Moderate amount of free abdominal/pelvic fluid. 7. Diffuse body wall edema.   These results will be called to the ordering clinician or representative by the Radiologist Assistant, and communication documented in the PACS or [employer].     [facility]  TAVR OPERATIVE NOTE     Date of Procedure:                01/27/2023   Preoperative Diagnosis:      Severe Aortic Stenosis    Postoperative Diagnosis:    Same    Procedure:        Transcatheter Aortic Valve Replacement - TransSubclavian Approach             Edwards Sapien 3 THV (size 23 mm, model # Q151231, serial # [id number] )              Co-Surgeons:                        Lauree Chandler, MD and Gilford Raid , MD    Anesthesiologist:                  Tobias Alexander   Echocardiographer:              Croitoru   Pre-operative Echo Findings: Severe aortic stenosis Normal left  ventricular systolic function   Post-operative Echo Findings: No paravalvular leak Normal left ventricular systolic function   _____________   Echo 01/28/23:    1. Left ventricular ejection fraction, by estimation, is 65 to 70%. The  left ventricle has normal function. The left ventricle has no regional  wall motion abnormalities. There is mild concentric left ventricular  hypertrophy. Left ventricular diastolic  function could not be evaluated. There is the interventricular septum is  flattened in diastole ('D' shaped left ventricle), consistent with right  ventricular volume overload.   2. Right ventricular systolic function is mildly reduced. The right  ventricular size is moderately enlarged. There is mildly elevated  pulmonary artery systolic pressure. The estimated right ventricular  systolic pressure is 0000000 mmHg.   3. Left atrial size was severely dilated.   4. Right atrial size was severely dilated.   5. The mitral valve is normal in structure. Mild mitral valve   regurgitation.   6. The tricuspid valve is abnormal. Tricuspid valve regurgitation is  severe.   7. The aortic valve has been repaired/replaced. Aortic valve  regurgitation is not visualized. There is a 23 mm Sapien prosthetic (TAVR)  valve present in the aortic position. Procedure Date: 01/27/2023. Echo  findings are consistent with normal structure  and function of the aortic valve prosthesis. Aortic valve mean gradient  measures 6.0 mmHg. Aortic valve Vmax measures 1.69 m/s. Aortic valve  acceleration time measures 53 msec.   8. The inferior vena cava is dilated in size with <50% respiratory  variability, suggesting right atrial pressure of 15 mmHg.   Comparison(s): Prior images reviewed side by side. Tricuspid insufficiency  is worse.   EKG:  EKG is ordered today.  The ekg ordered today demonstrates atrial fibrillation with HR 89bpm.   Recent Labs: 02/03/2023: ALT 8; B Natriuretic Peptide 760.6 02/04/2023: BUN 11; Creatinine, Ser 0.78; Magnesium 2.0; Platelets 161; Potassium 4.0; Sodium 137 02/05/2023: Hemoglobin 8.0  Recent Lipid Panel    Component Value Date/Time   CHOL 137 03/19/2022 1358   TRIG 96 03/19/2022 1358   HDL 69 03/19/2022 1358   CHOLHDL 2.0 03/19/2022 1358   LDLCALC 50 03/19/2022 1358   Physical Exam:    VS:  BP (!) 160/58   Pulse 89   Ht '5\' 5"'$  (1.651 m)   Wt 131 lb 6.4 oz (59.6 kg)   SpO2 98%   BMI 21.87 kg/m     Wt Readings from Last 3 Encounters:  02/13/23 131 lb 6.4 oz (59.6 kg)  02/05/23 143 lb 8.3 oz (65.1 kg)  01/29/23 139 lb (63 kg)    General: Well developed, well nourished, NAD Lungs:Clear to ausculation bilaterally. No wheezes, rales, or rhonchi. Breathing is unlabored. Cardiovascular: Irregularly irregular. No murmurs Abdomen: Soft, non-tender, non-distended. No obvious abdominal masses. Extremities: No edema.  Neuro: Alert and oriented. No focal deficits. No facial asymmetry. MAE spontaneously. Psych: Responds to questions appropriately  with normal affect.    ASSESSMENT/PLAN:    Severe AS: s/p successful TAVR with a 23 mm Edwards Sapien 3 THV via the [place] approach on 01/27/23. Post operative echo showed stable valve function with worsening TR however likely due to acute volume overload with PRBCs. We will follow with one month post TAVR echo. Groin sites stable with soft abdomen today. Continue to hold Eliquis through at least 2/29 although we will recheck CBC today before making that decision. She will require lifelong dental SBE with amoxicillin. Plan one month follow up with echo.  Large peritoneal hematoma: Called the office with acute abdominal pain post TAVR. CT imaging showed a large extraperitoneal hematoma measuring 20 x 13 x 6.5 cm. She was treated with pain medication and VVS was consulted. As above, continue to hold Eliquis at least through 2/29 however will obtain CBC today before making that change.    CAD s/p PCI: Pre TAVR R/LHC with patent pLAD and pRCA stents with no other occlusive disease noted. Denies anginal symptoms.     Post op anemia: Post TAVR CBC returned with a Hb at 6.3 with no other overt s/s of acute bleeding. Patient received 2u PRBCs with improvement in Hb to 8.7.  Felt to be dilutional per Dr. Cyndia Bent. Hb trending 9.5>>8.6>>8.6. Obtain CBC today.    Persistent atrial fibrillation: Eliquis held post TAVR until 2/29 due to anemia. EKG with AF with rate control today.    Chronic diastolic CHF: Appears euvolic on exam    HTN: Elevated today but reports BP readings at home WNL.     Medication Adjustments/Labs and Tests Ordered: Current medicines are reviewed at length with the patient today.  Concerns regarding medicines are outlined above.  Orders Placed This Encounter  Procedures   CBC   EKG 12-Lead   Meds ordered this encounter  Medications   amoxicillin (AMOXIL) 500 MG capsule    Sig: Take 4 capsules (2,000 mg total) by mouth as directed. Take 4 tablets 1 hour prior to dental work, including  cleanings.    Dispense:  12 capsule    Refill:  12    Order Specific Question:   Supervising Provider    Answer:   Sherren Mocha [id number]    Patient Instructions  Medication Instructions:  Your physician recommends that you continue on your current medications as directed. Please refer to the Current Medication list given to you today.  *If you need a refill on your cardiac medications before your next appointment, please call your pharmacy*   Lab Work: TODAY: CBC If you have labs (blood work) drawn today and your tests are completely normal, you will receive your results only by: [facility] (if you have MyChart) OR A paper copy in the mail If you have any lab test that is abnormal or we need to change your treatment, we will call you to review the results.   Testing/Procedures: NONE   Follow-Up: At [facility], you and your health needs are our priority.  As part of our continuing mission to provide you with exceptional heart care, we have created designated Provider Care Teams.  These Care Teams include your primary Cardiologist (physician) and Advanced Practice Providers (APPs -  Physician Assistants and Nurse Practitioners) who all work together to provide you with the care you need, when you need it.  We recommend signing up for the patient portal called "MyChart".  Sign up information is provided on this After Visit Summary.  MyChart is used to connect with patients for Virtual Visits (Telemedicine).  Patients are able to view lab/test results, encounter notes, upcoming appointments, etc.  Non-urgent messages can be sent to your provider as well.   To learn more about what you can do with MyChart, go to [email or web address].    Your next appointment:   KEEP SCHEDULED FOLLOW-UP    Signed, Kathyrn Drown, NP  02/13/2023 1:03 PM    [facility]

## 2023-02-16 DIAGNOSIS — I2510 Atherosclerotic heart disease of native coronary artery without angina pectoris: Secondary | ICD-10-CM | POA: Diagnosis not present

## 2023-02-16 DIAGNOSIS — T8189XD Other complications of procedures, not elsewhere classified, subsequent encounter: Secondary | ICD-10-CM | POA: Diagnosis not present

## 2023-02-16 DIAGNOSIS — I4819 Other persistent atrial fibrillation: Secondary | ICD-10-CM | POA: Diagnosis not present

## 2023-02-16 DIAGNOSIS — I5032 Chronic diastolic (congestive) heart failure: Secondary | ICD-10-CM | POA: Diagnosis not present

## 2023-02-16 DIAGNOSIS — I159 Secondary hypertension, unspecified: Secondary | ICD-10-CM | POA: Diagnosis not present

## 2023-02-16 DIAGNOSIS — E1159 Type 2 diabetes mellitus with other circulatory complications: Secondary | ICD-10-CM | POA: Diagnosis not present

## 2023-02-17 ENCOUNTER — Ambulatory Visit (INDEPENDENT_AMBULATORY_CARE_PROVIDER_SITE_OTHER): Payer: Medicare Other

## 2023-02-17 ENCOUNTER — Ambulatory Visit: Payer: Medicare Other | Admitting: Internal Medicine

## 2023-02-17 DIAGNOSIS — I5032 Chronic diastolic (congestive) heart failure: Secondary | ICD-10-CM | POA: Diagnosis not present

## 2023-02-17 DIAGNOSIS — E1169 Type 2 diabetes mellitus with other specified complication: Secondary | ICD-10-CM | POA: Diagnosis not present

## 2023-02-17 DIAGNOSIS — I081 Rheumatic disorders of both mitral and tricuspid valves: Secondary | ICD-10-CM

## 2023-02-17 DIAGNOSIS — I159 Secondary hypertension, unspecified: Secondary | ICD-10-CM | POA: Diagnosis not present

## 2023-02-17 DIAGNOSIS — I4819 Other persistent atrial fibrillation: Secondary | ICD-10-CM | POA: Diagnosis not present

## 2023-02-17 DIAGNOSIS — K661 Hemoperitoneum: Secondary | ICD-10-CM | POA: Diagnosis not present

## 2023-02-17 DIAGNOSIS — E785 Hyperlipidemia, unspecified: Secondary | ICD-10-CM | POA: Diagnosis not present

## 2023-02-17 DIAGNOSIS — I2510 Atherosclerotic heart disease of native coronary artery without angina pectoris: Secondary | ICD-10-CM | POA: Diagnosis not present

## 2023-02-17 DIAGNOSIS — E1159 Type 2 diabetes mellitus with other circulatory complications: Secondary | ICD-10-CM

## 2023-02-17 DIAGNOSIS — D62 Acute posthemorrhagic anemia: Secondary | ICD-10-CM

## 2023-02-18 DIAGNOSIS — I4819 Other persistent atrial fibrillation: Secondary | ICD-10-CM | POA: Diagnosis not present

## 2023-02-18 DIAGNOSIS — E1159 Type 2 diabetes mellitus with other circulatory complications: Secondary | ICD-10-CM | POA: Diagnosis not present

## 2023-02-18 DIAGNOSIS — I2510 Atherosclerotic heart disease of native coronary artery without angina pectoris: Secondary | ICD-10-CM | POA: Diagnosis not present

## 2023-02-18 DIAGNOSIS — I5032 Chronic diastolic (congestive) heart failure: Secondary | ICD-10-CM | POA: Diagnosis not present

## 2023-02-18 DIAGNOSIS — I159 Secondary hypertension, unspecified: Secondary | ICD-10-CM | POA: Diagnosis not present

## 2023-02-18 DIAGNOSIS — T8189XD Other complications of procedures, not elsewhere classified, subsequent encounter: Secondary | ICD-10-CM | POA: Diagnosis not present

## 2023-02-19 DIAGNOSIS — T8189XD Other complications of procedures, not elsewhere classified, subsequent encounter: Secondary | ICD-10-CM | POA: Diagnosis not present

## 2023-02-19 DIAGNOSIS — I159 Secondary hypertension, unspecified: Secondary | ICD-10-CM | POA: Diagnosis not present

## 2023-02-19 DIAGNOSIS — I2510 Atherosclerotic heart disease of native coronary artery without angina pectoris: Secondary | ICD-10-CM | POA: Diagnosis not present

## 2023-02-19 DIAGNOSIS — I4819 Other persistent atrial fibrillation: Secondary | ICD-10-CM | POA: Diagnosis not present

## 2023-02-19 DIAGNOSIS — I5032 Chronic diastolic (congestive) heart failure: Secondary | ICD-10-CM | POA: Diagnosis not present

## 2023-02-19 DIAGNOSIS — E1159 Type 2 diabetes mellitus with other circulatory complications: Secondary | ICD-10-CM | POA: Diagnosis not present

## 2023-02-24 DIAGNOSIS — B351 Tinea unguium: Secondary | ICD-10-CM | POA: Diagnosis not present

## 2023-02-24 DIAGNOSIS — L84 Corns and callosities: Secondary | ICD-10-CM | POA: Diagnosis not present

## 2023-02-25 DIAGNOSIS — I5032 Chronic diastolic (congestive) heart failure: Secondary | ICD-10-CM | POA: Diagnosis not present

## 2023-02-25 DIAGNOSIS — E1159 Type 2 diabetes mellitus with other circulatory complications: Secondary | ICD-10-CM | POA: Diagnosis not present

## 2023-02-25 DIAGNOSIS — T8189XD Other complications of procedures, not elsewhere classified, subsequent encounter: Secondary | ICD-10-CM | POA: Diagnosis not present

## 2023-02-25 DIAGNOSIS — I2510 Atherosclerotic heart disease of native coronary artery without angina pectoris: Secondary | ICD-10-CM | POA: Diagnosis not present

## 2023-02-25 DIAGNOSIS — I4819 Other persistent atrial fibrillation: Secondary | ICD-10-CM | POA: Diagnosis not present

## 2023-02-25 DIAGNOSIS — I159 Secondary hypertension, unspecified: Secondary | ICD-10-CM | POA: Diagnosis not present

## 2023-03-03 DIAGNOSIS — T8189XD Other complications of procedures, not elsewhere classified, subsequent encounter: Secondary | ICD-10-CM | POA: Diagnosis not present

## 2023-03-03 DIAGNOSIS — I5032 Chronic diastolic (congestive) heart failure: Secondary | ICD-10-CM | POA: Diagnosis not present

## 2023-03-03 DIAGNOSIS — I2510 Atherosclerotic heart disease of native coronary artery without angina pectoris: Secondary | ICD-10-CM | POA: Diagnosis not present

## 2023-03-03 DIAGNOSIS — I4819 Other persistent atrial fibrillation: Secondary | ICD-10-CM | POA: Diagnosis not present

## 2023-03-03 DIAGNOSIS — I159 Secondary hypertension, unspecified: Secondary | ICD-10-CM | POA: Diagnosis not present

## 2023-03-03 DIAGNOSIS — E1159 Type 2 diabetes mellitus with other circulatory complications: Secondary | ICD-10-CM | POA: Diagnosis not present

## 2023-03-03 NOTE — Progress Notes (Unsigned)
[facility]                                     Cardiology Office Note:    Date:  03/05/2023   ID:  [name], DOB 1936-08-16, MRN [id number]  PCP:  Baruch Gouty, [facility] Cardiologist:  Glenetta Hew, MD /Dr. Angelena Form, MD and Dr. Cyndia Bent, MD (TAVR)   [facility] Electrophysiologist:  None   Referring MD: Baruch Gouty, FNP   Chief Complaint  Patient presents with   Labs Only   Follow-up    1 month s/p TAVR   History of Present Illness:    [name] is a 87 y.o. female with a hx of  persistent atrial fibrillation, CAD with prior PCI, chronic diastolic CHF, HTN, HLD, DM diet controlled and severe aortic stenosis who presented to [facility] for planned TAVR and is seen today for one month follow up.    [name] has CAD and had a bare metal stent placed in the RCA in 1999 and another distal bare metal stent placed in the distal RCA in 2012. Last cardiac cath in 2012 with severe in stent restenosis in distal RCA stent treated with cutting balloon angioplasty. She has been followed over the past year for severe aortic stenosis. Echo 11/2021 with normal LV function, mild MR and severe low flow/low gradient AS with mean gradient 27 mmHg, AVA 0.8 cm2. She was called at that time and referral to the structural heart clinic was made but she had other family issues and did not schedule this appt. Most recent echo 06/20/22 with LVEF=65-70%, mild MR as well as severe low flow/low gradient AS with mean gradient 27 mmHg, AVA 0.72 cm2, DI 0.23, SVI 34. She had a recent upper endoscopy and was found to have gastritis with no ulcer.     She was seen by Dr. Angelena Form in the structural heart clinic for TAVR work up on 10/06/22. Plan was to proceed with [facility] but the patient wanted to go home and discuss this with her family. She eventually underwent cardiac catheterization which showed non-obstructive CAD with widely patent  bare-metal stents in the mid and distal RCA, and trivial disease in the LCx system.    CT imaging performed with no preclusive issues. The patient was evaluated by the multidisciplinary valve team and felt to have severe, symptomatic aortic stenosis and to be a suitable candidate.    She is now s/p successful TAVR with a 23 mm Edwards Sapien 3 THV via the [place] approach on 01/27/23. Post operative echo showed stable valve function with worsening TR however likely due to acute volume overload with PRBCs. She had post op anemia with a Hb at 6.3 however no other overt s/s of acute bleeding. Patient received 2u PRBCs with improvement in Hb to 8.7.  Felt to be dilutional per Dr. Cyndia Bent. Hb trended 9.5>>8.6>>8.6. She was kept additional time due to this and was ultimately discharged home with her daughter.    She then called the office with severe right sided abdominal pain. CT imaging showed a large extraperitoneal hematoma measuring 20 x 13 x 6.5 cm. She was treated with pain medication and VVS was consulted with no surgical recommendations. Plan was to hold Eliquis at least 1 week possibly 2 weeks depending on her clinical progress.    In follow  up, she continued to have extreme fatigue and color was very poor on exam. Hb was stable. Today she is here with her daughter and reports that she is doing much better. Her color has improved and she is increasing her activity. She has minimal abdominal pain. She denies chest pain, palpitations, SOB, LE edema, orthopnea, dizziness, or syncope. No bleeding in stool or urine. We discussed participating in Golf and she is interested.   Past Medical History:  Diagnosis Date   A-fib [facility])    Anxiety    CAD S/P percutaneous coronary angioplasty 1990s, 11/28/2011    a) midRCA PCI 1999 (NIR BMS 3.0 mm x 32 mm); distal  RCA BMS Sept 2012 (Vision BMS 2.5 mm x 15 mm); 12/02: RCA ISR --> Cutting PTCA    Coronary stent restenosis due to progression of disease 11/30/2011    Dyslipidemia, goal LDL below 70 11/28/2011   Essential hypertension    On ACE inhibitor and ARB? As well as atenolol and hydralazine   Gastric ulcer 11/2018   GERD (gastroesophageal reflux disease)    History of blood transfusion    without reaction   Moderate aortic stenosis by prior echocardiogram 08/2011; 08/2013   Echo 11/09/18: EF 60-65%. Gr 2 DD.  No RWMA.  Mild-Mod AS (mean gradient 19 mmHg). Mild-Mod PAH. 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 MAngelena Formand Dr. BCyndia Bent  Type 2 diabetes mellitus with complication, without long-term [place] use of insulin [place]    no longer taking diabetic medication - diet controlled    Past Surgical History:  Procedure Laterality Date   ABDOMINAL HYSTERECTOMY     BIOPSY  06/03/2018   Procedure: BIOPSY;  Surgeon: RRogene Houston MD;  [facility];  Service: Endoscopy;;  gastric    BIOPSY  09/15/2022   Procedure: BIOPSY;  Surgeon: CEloise Harman DO;  [facility];  Service: Endoscopy;;   BREAST BIOPSY     CARDIAC CATHETERIZATION  2012   performed for angina   CARDIAC CATHETERIZATION     CARDIOVERSION N/A 10/24/2020   Procedure: CARDIOVERSION;  Surgeon: SJerline Pain MD;  [facility];  Service: Cardiovascular;  Laterality: N/A;   CORONARY ANGIOPLASTY  11/2006   Cutting Balloon of stent in the RCA   ESOPHAGOGASTRODUODENOSCOPY N/A 06/03/2018   Procedure: ESOPHAGOGASTRODUODENOSCOPY (EGD);  Surgeon: RRogene Houston MD;  [facility];  Service: Endoscopy;  Laterality: N/A;  1:40   ESOPHAGOGASTRODUODENOSCOPY N/A  12/02/2018   Procedure: ESOPHAGOGASTRODUODENOSCOPY (EGD);  Surgeon: RRogene Houston MD;  [facility];  Service: Endoscopy;  Laterality: N/A;  255   ESOPHAGOGASTRODUODENOSCOPY (EGD) WITH PROPOFOL N/A 09/15/2022   Procedure: ESOPHAGOGASTRODUODENOSCOPY (EGD) WITH PROPOFOL;  Surgeon: CEloise Harman DO;  [facility];  Service: Endoscopy;  Laterality: N/A;  1:00pm, asa 3   LEFT HEART CATHETERIZATION WITH CORONARY ANGIOGRAM N/A 11/27/2011   Procedure: LEFT HEART CATHETERIZATION WITH CORONARY ANGIOGRAM;  Surgeon: DLeonie Man MD;  [facility];  Service: Cardiovascular;  Laterality: N/A;   PERCUTANEOUS CORONARY STENT INTERVENTION (PCI-S)  1999   Mid RCA - NIR DMS 3.0 mm x 30 mm, dRCA Vision BMS 2.5 mm x 15 mm    RIGHT HEART CATH AND CORONARY ANGIOGRAPHY N/A 11/24/2022   Procedure: RIGHT HEART CATH AND CORONARY ANGIOGRAPHY;  Surgeon: Leonie Man, MD;  Location: [place] [facility];  Service: Cardiovascular;  Laterality: N/A;   TEE WITHOUT CARDIOVERSION N/A 01/27/2023   Procedure: TRANSESOPHAGEAL ECHOCARDIOGRAM;  Surgeon: Burnell Blanks, MD;  Location: [place];  Service: Open Heart Surgery;  Laterality: N/A;   TRANSTHORACIC ECHOCARDIOGRAM  10/2018   EF 60-65%. Gr 2 DD.  No RWMA.  Mild-Mod AS  (mean gradient 19 mmHg). Mild-Mod PAH.   TRANSTHORACIC ECHOCARDIOGRAM  03/01/2020   EF 70 to 75%.  No or WMA.  Mild LVH.  GRII DD with severe LA enlargement.  Mildly elevated PAP.  Mild MR.  Moderate AS (mean gradient 17 mmHg) with dimensionless index of 0.32.   TUBAL LIGATION      Current Medications: Current Meds  Medication Sig   amoxicillin (AMOXIL) 500 MG capsule Take 4 capsules (2,000 mg total) by mouth as directed. Take 4 tablets 1 hour prior to dental work, including cleanings.   atorvastatin (LIPITOR) 20 MG tablet TAKE ONE (1) TABLET EACH DAY   carvedilol (COREG) 25 MG tablet TAKE ONE TABLET BY MOUTH TWICE DAILY   diphenhydramine-acetaminophen (TYLENOL PM)  25-500 MG TABS tablet Take 2 tablets by mouth at bedtime.   ELIQUIS 2.5 MG TABS tablet Take 2.5 mg by mouth 2 (two) times daily.   furosemide (LASIX) 40 MG tablet Take 1 tablet (40 mg total) by mouth daily as needed for fluid.   hydrALAZINE (APRESOLINE) 100 MG tablet TAKE ONE (1) TABLET BY MOUTH TWO (2) TIMES DAILY   loperamide (IMODIUM A-D) 2 MG tablet Take 2 mg by mouth 4 (four) times daily as needed for diarrhea or loose stools.   NITROSTAT 0.4 MG SL tablet DISSOLVE 1 TAB UNDER TOUNGE FOR CHEST PAIN. MAY REPEAT EVERY 5 MINUTES FOR 3 DOSES. IF NO RELIEF CALL 911 OR GO TO ER   pantoprazole (PROTONIX) 40 MG tablet TAKE 1 TABLET BY MOUTH TWICE DAILY BEFORE MEALS     Allergies:   Valium   Social History   Socioeconomic History   Marital status: Widowed    Spouse name: Carloyn Manner   Number of children: 3   Years of education: Not on file   Highest education level: Not on file  Occupational History   Occupation: Retired Marine scientist  Tobacco Use   Smoking status: Former   Smokeless tobacco: Never   Tobacco comments:    Quit approximately 1993  Vaping Use   Vaping Use: Never used  Substance and Sexual Activity   Alcohol use: No   Drug use: No   Sexual activity: Not Currently    Birth control/protection: Post-menopausal  Other Topics Concern   Not on file  Social History Narrative   She is a married mother of 46, 2 daughters, 1 son who all live out of state but talks to weekly.   Step-son and his family live next door. Sees often.    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    Quit smoking in '99, denies alcohol.   She volunteers for [employer].   Social Determinants of Health   Financial Resource Strain: Low Risk  (06/18/2022)   Overall Financial Resource Strain (CARDIA)    Difficulty of Paying Living Expenses: Not hard at all  Food Insecurity: No Food Insecurity (02/06/2023)   Hunger Vital Sign    Worried About Running Out of Food in the Last Year: Never true    Ran Out of Food  in the Last Year: Never true  Transportation Needs: No Transportation Needs (01/30/2023)   PRAPARE - Hydrologist (Medical): No    Lack of Transportation (Non-Medical): No  Physical Activity: Inactive (06/18/2022)   Exercise Vital Sign    Days of Exercise per Week: 0 days    Minutes of Exercise per Session: 0 min  Stress: No Stress Concern Present (06/18/2022)   [place]    Feeling of Stress : Only a little  Social Connections: Moderately Integrated (06/18/2022)   Social Connection and Isolation Panel [NHANES]    Frequency of Communication with Friends and Family: More than three times a week    Frequency of Social Gatherings with Friends and Family: More than three times a week    Attends Religious Services: 1 to 4 times per year    Active Member of [employer] or Organizations: No    Attends Music therapist: Never    Marital Status: Married     Family History: The patient's family history includes Cancer in her mother; Heart Problems in her brother; Heart disease in her brother, father, maternal grandfather, and maternal grandmother; Nephrolithiasis in her brother.  ROS:   Please see the history of present illness.    All other systems reviewed and are negative.  EKGs/Labs/Other Studies Reviewed:    The following studies were reviewed today:  Echocardiogram 03/04/23:    1. Left ventricular ejection fraction, by estimation, is 70 to 75%. The  left ventricle has hyperdynamic function. The left ventricle has no  regional wall motion abnormalities. Left ventricular diastolic parameters  are indeterminate.   2. Right ventricular systolic function is normal. The right ventricular  size is normal. There is moderately elevated pulmonary artery systolic  pressure.   3. Left atrial size was severely dilated.   4. Right atrial size was severely dilated.   5. The mitral valve is normal  in structure. Trivial mitral valve  regurgitation. No evidence of mitral stenosis.   6. Tricuspid valve regurgitation is severe.   7. The aortic valve has been repaired/replaced. Aortic valve  regurgitation is mild. No aortic stenosis is present. There is a 23 mm  Sapien prosthetic (TAVR) valve present in the aortic position. Aortic  regurgitation PHT measures 335 msec. Aortic valve  area, by VTI measures 1.36 cm. Aortic valve mean gradient measures 9.0  mmHg. Aortic valve Vmax measures 2.03 m/s.   8. The inferior vena cava is normal in size with greater than 50%  respiratory variability, suggesting right atrial pressure of 3 mmHg.   9. Cannot exclude a small PFO.    CT 02/03/23:  IMPRESSION: 1. Surgical changes from recent TAVR. No complicating features are identified. 2. Large extraperitoneal abdominal/pelvic hematoma on the right side. It measures approximately 20 x 13 x 6.5 cm. 3. Moderate-sized bilateral pleural effusions, right larger than left with overlying atelectasis. 4. Advanced vascular disease but no aneurysm, dissection or significant stenosis. 5. Suspect changes of hepatic congestion given the amount of reflux of contrast down the IVC and into the hepatic veins. 6. Moderate amount of free abdominal/pelvic fluid. 7. Diffuse body wall edema.   These results will be called to the ordering clinician or representative by the Radiologist Assistant, and  communication documented in the PACS or [employer].       [facility]  TAVR OPERATIVE NOTE     Date of Procedure:                01/27/2023   Preoperative Diagnosis:      Severe Aortic Stenosis    Postoperative Diagnosis:    Same    Procedure:        Transcatheter Aortic Valve Replacement - TransSubclavian Approach             Edwards Sapien 3 THV (size 23 mm, model # T562222, serial # [id number] )              Co-Surgeons:                        Lauree Chandler, MD and Gilford Raid ,  MD    Anesthesiologist:                  Tobias Alexander   Echocardiographer:              Croitoru   Pre-operative Echo Findings: Severe aortic stenosis Normal left ventricular systolic function   Post-operative Echo Findings: No paravalvular leak Normal left ventricular systolic function   _____________   Echo 01/28/23:    1. Left ventricular ejection fraction, by estimation, is 65 to 70%. The  left ventricle has normal function. The left ventricle has no regional  wall motion abnormalities. There is mild concentric left ventricular  hypertrophy. Left ventricular diastolic  function could not be evaluated. There is the interventricular septum is  flattened in diastole ('D' shaped left ventricle), consistent with right  ventricular volume overload.   2. Right ventricular systolic function is mildly reduced. The right  ventricular size is moderately enlarged. There is mildly elevated  pulmonary artery systolic pressure. The estimated right ventricular  systolic pressure is 0000000 mmHg.   3. Left atrial size was severely dilated.   4. Right atrial size was severely dilated.   5. The mitral valve is normal in structure. Mild mitral valve  regurgitation.   6. The tricuspid valve is abnormal. Tricuspid valve regurgitation is  severe.   7. The aortic valve has been repaired/replaced. Aortic valve  regurgitation is not visualized. There is a 23 mm Sapien prosthetic (TAVR)  valve present in the aortic position. Procedure Date: 01/27/2023. Echo  findings are consistent with normal structure  and function of the aortic valve prosthesis. Aortic valve mean gradient  measures 6.0 mmHg. Aortic valve Vmax measures 1.69 m/s. Aortic valve  acceleration time measures 53 msec.   8. The inferior vena cava is dilated in size with <50% respiratory  variability, suggesting right atrial pressure of 15 mmHg.   Comparison(s): Prior images reviewed side by side. Tricuspid insufficiency  is worse.    EKG:  EKG  is not ordered today.    Recent Labs: 02/03/2023: ALT 8; B Natriuretic Peptide 760.6 02/04/2023: BUN 11; Creatinine, Ser 0.78; Magnesium 2.0; Potassium 4.0; Sodium 137 03/04/2023: Hemoglobin 9.8; Platelets 182  Recent Lipid Panel    Component Value Date/Time   CHOL 137 03/19/2022 1358   TRIG 96 03/19/2022 1358   HDL 69 03/19/2022 1358   CHOLHDL 2.0 03/19/2022 1358   LDLCALC 50 03/19/2022 1358    Physical Exam:    VS:  BP (!) 140/60   Pulse 78   Ht 5' 5.5" (1.664 m)   Wt 125 lb 12.8  oz (57.1 kg)   SpO2 99%   BMI 20.62 kg/m     Wt Readings from Last 3 Encounters:  03/04/23 125 lb 12.8 oz (57.1 kg)  02/13/23 131 lb 6.4 oz (59.6 kg)  02/05/23 143 lb 8.3 oz (65.1 kg)    General: Well developed, well nourished, NAD Lungs:Clear to ausculation bilaterally. No wheezes, rales, or rhonchi. Breathing is unlabored. Cardiovascular: RRR with S1 S2. + flow murmur Abdomen: Soft, non-tender, non-distended. No obvious abdominal masses. Extremities: No edema.  Neuro: Alert and oriented. No focal deficits. No facial asymmetry. MAE spontaneously. Psych: Responds to questions appropriately with normal affect.    ASSESSMENT/PLAN:    Severe AS: Pt doing well with NYHA class I symptoms s/p successful TAVR with a 23 mm Edwards Sapien 3 THV via the [place] approach on 01/27/23. Site healing very well. Echo today with mild AR and stable valve functioning with mean gradient at 68mHg and AVA by VTI at 1.36cm2. There is trivial MR, and severe TR. Continue Eliquis monotherapy. CBC today with stable Hb. She will require lifelong dental SBE with amoxicillin.    Large peritoneal hematoma: CT imaging showed a large extraperitoneal hematoma measuring 20 x 13 x 6.5 cm. She was treated with pain medication and VVS was consulted. Minimal pain. CBC stable today.    CAD s/p PCI: Pre TAVR R/LHC with patent pLAD and pRCA stents with no other occlusive disease noted. Denies anginal symptoms.     Persistent atrial  fibrillation: Eliquis restarted after peritoneal hematoma with stable Hb today. Would recommend follow up with PCP for treatment of anemia.     Chronic diastolic CHF: Appears euvolic on exam. No change.    HTN: Stable with no change today.   Medication Adjustments/Labs and Tests Ordered: Current medicines are reviewed at length with the patient today.  Concerns regarding medicines are outlined above.  Orders Placed This Encounter  Procedures   CBC   No orders of the defined types were placed in this encounter.   Patient Instructions  Medication Instructions:  Your physician recommends that you continue on your current medications as directed. Please refer to the Current Medication list given to you today.  *If you need a refill on your cardiac medications before your next appointment, please call your pharmacy*   Lab Work: TODAY: CBC If you have labs (blood work) drawn today and your tests are completely normal, you will receive your results only by: [place](if you have MyChart) OR A paper copy in the mail If you have any lab test that is abnormal or we need to change your treatment, we will call you to review the results.   Testing/Procedures: NONE   Follow-Up: [facility] our [facility].  As part of our continuing mission to provide you with exceptional heart care, we have created designated Provider Care Teams.  These Care Teams include your primary Cardiologist (physician) and Advanced Practice Providers (APPs -  Physician Assistants and Nurse Practitioners) who all work together to provide you with the care you need, when you need it.  We recommend signing up for the patient portal called "MyChart".  Sign up information is provided on this After Visit Summary.  MyChart is used to connect with patients for Virtual Visits (Telemedicine).  Patients are able to view lab/test results, encounter notes, upcoming appointments, etc.   Non-urgent messages can be sent to your provider as well.   To learn more about what you  can do with MyChart, go to [email or web address].    Your next appointment:   KEEP SCHEDULED FOLLOW-UP  FOLLOW-UP WITH DR. HARDING IN AUG OR SEPT 2024   Signed, Kathyrn Drown, NP  03/05/2023 10:54 AM    [facility]

## 2023-03-04 ENCOUNTER — Ambulatory Visit (INDEPENDENT_AMBULATORY_CARE_PROVIDER_SITE_OTHER): Payer: Medicare Other | Admitting: Cardiology

## 2023-03-04 ENCOUNTER — Ambulatory Visit (HOSPITAL_COMMUNITY): Payer: Medicare Other | Attending: Cardiovascular Disease

## 2023-03-04 ENCOUNTER — Telehealth: Payer: Self-pay | Admitting: Cardiology

## 2023-03-04 VITALS — BP 140/60 | HR 78 | Ht 65.5 in | Wt 125.8 lb

## 2023-03-04 DIAGNOSIS — D62 Acute posthemorrhagic anemia: Secondary | ICD-10-CM

## 2023-03-04 DIAGNOSIS — Z9861 Coronary angioplasty status: Secondary | ICD-10-CM | POA: Insufficient documentation

## 2023-03-04 DIAGNOSIS — K683 Retroperitoneal hematoma: Secondary | ICD-10-CM | POA: Insufficient documentation

## 2023-03-04 DIAGNOSIS — I350 Nonrheumatic aortic (valve) stenosis: Secondary | ICD-10-CM

## 2023-03-04 DIAGNOSIS — I110 Hypertensive heart disease with heart failure: Secondary | ICD-10-CM

## 2023-03-04 DIAGNOSIS — I2510 Atherosclerotic heart disease of native coronary artery without angina pectoris: Secondary | ICD-10-CM | POA: Insufficient documentation

## 2023-03-04 DIAGNOSIS — D649 Anemia, unspecified: Secondary | ICD-10-CM | POA: Insufficient documentation

## 2023-03-04 DIAGNOSIS — Z952 Presence of prosthetic heart valve: Secondary | ICD-10-CM

## 2023-03-04 DIAGNOSIS — I5032 Chronic diastolic (congestive) heart failure: Secondary | ICD-10-CM

## 2023-03-04 LAB — ECHOCARDIOGRAM COMPLETE
AR max vel: 1.3 cm2
AV Area VTI: 1.36 cm2
AV Area mean vel: 1.35 cm2
AV Mean grad: 9 mmHg
AV Peak grad: 16.5 mmHg
Ao pk vel: 2.03 m/s
Area-P 1/2: 3.7 cm2
MV M vel: 4.89 m/s
MV Peak grad: 95.6 mmHg
P 1/2 time: 335 msec
S' Lateral: 2 cm

## 2023-03-04 NOTE — Patient Instructions (Addendum)
Medication Instructions:  Your physician recommends that you continue on your current medications as directed. Please refer to the Current Medication list given to you today.  *If you need a refill on your cardiac medications before your next appointment, please call your pharmacy*   Lab Work: TODAY: CBC If you have labs (blood work) drawn today and your tests are completely normal, you will receive your results only by: [place] (if you have MyChart) OR A paper copy in the mail If you have any lab test that is abnormal or we need to change your treatment, we will call you to review the results.   Testing/Procedures: NONE   Follow-Up: At [facility], you and your health needs are our priority.  As part of our continuing mission to provide you with exceptional heart care, we have created designated Provider Care Teams.  These Care Teams include your primary Cardiologist (physician) and Advanced Practice Providers (APPs -  Physician Assistants and Nurse Practitioners) who all work together to provide you with the care you need, when you need it.  We recommend signing up for the patient portal called "MyChart".  Sign up information is provided on this After Visit Summary.  MyChart is used to connect with patients for Virtual Visits (Telemedicine).  Patients are able to view lab/test results, encounter notes, upcoming appointments, etc.  Non-urgent messages can be sent to your provider as well.   To learn more about what you can do with MyChart, go to [email or web address].    Your next appointment:   KEEP SCHEDULED FOLLOW-UP  FOLLOW-UP WITH DR. HARDING IN AUG OR SEPT 2024

## 2023-03-05 ENCOUNTER — Other Ambulatory Visit: Payer: Self-pay | Admitting: Family Medicine

## 2023-03-05 DIAGNOSIS — E1159 Type 2 diabetes mellitus with other circulatory complications: Secondary | ICD-10-CM

## 2023-03-05 LAB — CBC
Hematocrit: 30.7 % — ABNORMAL LOW (ref 34.0–46.6)
Hemoglobin: 9.8 g/dL — ABNORMAL LOW (ref 11.1–15.9)
MCH: 29.5 pg (ref 26.6–33.0)
MCHC: 31.9 g/dL (ref 31.5–35.7)
MCV: 93 fL (ref 79–97)
Platelets: 182 10*3/uL (ref 150–450)
RBC: 3.32 x10E6/uL — ABNORMAL LOW (ref 3.77–5.28)
RDW: 15.3 % (ref 11.7–15.4)
WBC: 5.3 10*3/uL (ref 3.4–10.8)

## 2023-03-09 ENCOUNTER — Encounter: Payer: Self-pay | Admitting: Gastroenterology

## 2023-03-09 ENCOUNTER — Ambulatory Visit: Payer: Medicare Other | Admitting: Gastroenterology

## 2023-03-09 VITALS — BP 158/70 | HR 87 | Temp 97.7°F | Ht 65.0 in | Wt 126.6 lb

## 2023-03-09 DIAGNOSIS — R159 Full incontinence of feces: Secondary | ICD-10-CM

## 2023-03-09 DIAGNOSIS — D649 Anemia, unspecified: Secondary | ICD-10-CM

## 2023-03-09 DIAGNOSIS — K219 Gastro-esophageal reflux disease without esophagitis: Secondary | ICD-10-CM

## 2023-03-09 NOTE — Patient Instructions (Addendum)
Continue pantoprazole 40mg  twice daily. Trial of imodium 1/2 tablet (1mg ) once daily, take 3 hours after supper OR breakfast, which ever is most convenient. This is to help with fecal incontinence.  Use diaper rash cream to your bottom to help redness/irritation from moisture.  Recheck CBC in 6 weeks.  Return office visit in 6-8 weeks. Call sooner if any questions or concerns.

## 2023-03-09 NOTE — Progress Notes (Signed)
GI Office Note    Referring Provider: Baruch Gouty, FNP Primary Care Physician:  Baruch Gouty, [place]  Primary Gastroenterologist: Garfield Cornea, MD   Chief Complaint   Chief Complaint  Patient presents with   Follow-up    Doing better with incontinence, having issues with belching.     History of Present Illness   [name] is a 87 y.o. female presenting today for follow up.  Patient seen in 08/2022 for epigastric pain, melena. She was heme positive at that time. Started on pantoprazole. Hgb with slight decline from 10.7 to 9.9. she has history of gastric ulcers in 2019 in setting of BC powders. No NSAIDs at this time. She is chronically on Eliquis. Plans for EGD but due to aortic stenosis she required urgent cardiology follow up/clearance before we could schedule. EGD completed 08/2022: noted to have gastritis, no h pylori. Plans for capsule endoscopy if Hgb drops less than 10. Patient completed abdominal u/s 10/2022 which showed nodular liver border.   Seen back in 12/2022 for follow up. Complains of episodes of fecal incontinence, intermittent, sometimes formed stools. On rectal exam, moderately good sphincter tone, poor squeeze, no impaction. Started 2mg  imodium BID to help improve continence. Plans for colonoscopy after aortic valve repair.   Patient did not try imodium, for fear of developing constipation. Currently having 1-2 stools daily, more formed. Although this morning stools were loose. Overall feels like incontinence is better. Has issues with urinary incontinence. Stays wet, wears depends, and puts pads inside the depends as well. In bed about 12 hours in the evening and stays wetter at this time. Has noted some redness in the perineal area. No significant abdominal pain at this time. Some short lived lower abdominal pain with jolting, going down her unpaved road. She continues to have a lot of belching. Notes drinking a lot of diet [facility]. Recently started  to use straws. Belching mostly after meals.    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 Per cardiology note, she had post op anemia with a Hb at 6.3 however no other overt s/s of acute bleeding. Patient received 2u PRBCs with improvement in Hb to 8.7.  Felt to be dilutional per Dr. Cyndia Bent. Hb trended 9.5>>8.6>>8.6. She was kept additional time due to this and was ultimately discharged home with her daughter. She then called the office with severe right sided abdominal pain. CT imaging showed a large extraperitoneal hematoma measuring 20 x 13 x 6.5 cm. She was treated with pain medication and [facility]VS was consulted with no surgical recommendations. Plan was to hold Eliquis at least 1 week possibly 2 weeks depending on her clinical progress.   She is back on Eliquis at this time.   10/2022: 140 pounds 12/2022: 140 pounds 01/2023: 131 pounds Today: 126 pounds  CTA chest/abd/pelvis 02/03/23: IMPRESSION: 1. Surgical changes from recent TAVR. No complicating features are identified. 2. Large extraperitoneal abdominal/pelvic hematoma on the right side. It measures approximately 20 x 13 x 6.5 cm. 3. Moderate-sized bilateral pleural effusions, right larger than left with overlying atelectasis. 4. Advanced vascular disease but no aneurysm, dissection or significant stenosis. 5. Suspect changes of hepatic congestion given the amount of reflux of contrast down the IVC and into the hepatic veins. 6. Moderate amount of free abdominal/pelvic fluid. 7. Diffuse body wall edema.     Component     Latest Ref Rng 01/23/2023 01/28/2023 01/29/2023 02/03/2023 02/04/2023 02/05/2023 02/13/2023  Hemoglobin     11.1 -  15.9 g/dL 9.8 (L)  7.8 (L)  8.7 (L)  8.6 (L)  8.4 (L)  8.0 (L)  9.5 (L)   Hemoglobin       6.3 (LL)   9.5 (L)  8.6 (L)     Hemoglobin         8.9 (L)      HCT     34.0 - 46.6 % 32.6 (L)  23.6 (L)  26.2 (L)  27.5 (L)  25.8 (L)  25.5 (L)  29.1 (L)   HCT       19.5 (L)   28.0 (L)  26.6 (L)     HCT         27.7  (L)       Component     Latest Ref Rng 03/04/2023  Hemoglobin     11.1 - 15.9 g/dL 9.8 (L)   HCT     34.0 - 46.6 % 30.7 (L)       EGD November 2023 for abdominal pain: -Gastritis, reactive gastropathy on path, negative for H. pylori  Medications   Current Outpatient Medications  Medication Sig Dispense Refill   atorvastatin (LIPITOR) 20 MG tablet TAKE ONE (1) TABLET EACH DAY 90 tablet 2   carvedilol (COREG) 25 MG tablet TAKE ONE TABLET BY MOUTH TWICE DAILY 180 tablet 3   diphenhydramine-acetaminophen (TYLENOL PM) 25-500 MG TABS tablet Take 2 tablets by mouth at bedtime.     ELIQUIS 2.5 MG TABS tablet Take 2.5 mg by mouth 2 (two) times daily.     furosemide (LASIX) 40 MG tablet Take 1 tablet (40 mg total) by mouth daily as needed for fluid. 30 tablet 3   hydrALAZINE (APRESOLINE) 100 MG tablet Take 1 tablet (100 mg total) by mouth 2 (two) times daily. (NEEDS TO BE SEEN BEFORE NEXT REFILL) 60 tablet 0   loperamide (IMODIUM A-D) 2 MG tablet Take 2 mg by mouth 4 (four) times daily as needed for diarrhea or loose stools.     NITROSTAT 0.4 MG SL tablet DISSOLVE 1 TAB UNDER TOUNGE FOR CHEST PAIN. MAY REPEAT EVERY 5 MINUTES FOR 3 DOSES. IF NO RELIEF CALL 911 OR GO TO ER 25 tablet 3   pantoprazole (PROTONIX) 40 MG tablet TAKE 1 TABLET BY MOUTH TWICE DAILY BEFORE MEALS 60 tablet 3   sertraline (ZOLOFT) 25 MG tablet Take 25 mg by mouth daily.     amoxicillin (AMOXIL) 500 MG capsule Take 4 capsules (2,000 mg total) by mouth as directed. Take 4 tablets 1 hour prior to dental work, including cleanings. (Patient not taking: Reported on 03/09/2023) 12 capsule 12   No current facility-administered medications for this visit.    Allergies   Allergies as of 03/09/2023 - Review Complete 03/09/2023  Allergen Reaction Noted   Valium Nausea And Vomiting 11/27/2011      Review of Systems   General: Negative for anorexia,  fever, chills, fatigue. See hpi ENT: Negative for hoarseness, difficulty  swallowing , nasal congestion. CV: Negative for chest pain, angina, palpitations, dyspnea on exertion, peripheral edema.  Respiratory: Negative for dyspnea at rest, dyspnea on exertion, cough, sputum, wheezing.  GI: See history of present illness. GU:  Negative for dysuria, hematuria, urinary incontinence, urinary frequency, nocturnal urination.  Endo: Negative for unusual weight change.     Physical Exam   BP (!) 158/70 (BP Location: Right Arm, Patient Position: Sitting, Cuff Size: Normal)   Pulse 87   Temp 97.7 F (36.5 C) (Oral)   Ht  5\' 5"  (1.651 m)   Wt 126 lb 9.6 oz (57.4 kg)   SpO2 98%   BMI 21.07 kg/m    General: Well-nourished, well-developed in no acute distress. Slightly Pale.  Eyes: No icterus. Mouth: Oropharyngeal mucosa moist and pink  Lungs: Clear to auscultation bilaterally.  Heart: Regular rate and rhythm, no murmurs rubs or gallops.  Abdomen: Bowel sounds are normal, nontender, nondistended, no hepatosplenomegaly or masses,  no abdominal bruits or hernia , no rebound or guarding.  Rectal: mild erythema diffusely in perineal area. No breakdown. Extremities: No lower extremity edema. No clubbing or deformities. Neuro: Alert and oriented x 4   Skin: Warm and dry, no jaundice.   Psych: Alert and cooperative, normal mood and affect.  Labs   Lab Results  Component Value Date   WBC 5.3 03/04/2023   HGB 9.8 (L) 03/04/2023   HCT 30.7 (L) 03/04/2023   MCV 93 03/04/2023   PLT 182 03/04/2023   Lab Results  Component Value Date   CREATININE 0.78 02/04/2023   BUN 11 02/04/2023   NA 137 02/04/2023   K 4.0 02/04/2023   CL 99 02/04/2023   CO2 26 02/04/2023   Lab Results  Component Value Date   ALT 8 02/03/2023   AST 17 02/03/2023   ALKPHOS 53 02/03/2023   BILITOT 1.4 (H) 02/03/2023   Lab Results  Component Value Date   INR 1.8 (H) 02/03/2023   INR 1.5 (H) 01/23/2023   INR 1.17 09/04/2011   Lab Results  Component Value Date   LIPASE 39 02/03/2023     Imaging Studies   ECHOCARDIOGRAM COMPLETE  Result Date: 03/04/2023    ECHOCARDIOGRAM REPORT   Patient Name:   JENIYAH CAUTHORN Date of Exam: 03/04/2023 Medical Rec #:  [id number]         Height:       65.0 in Accession #:    [id number]        Weight:       131.4 lb Date of Birth:  1936/03/20         BSA:          1.655 m Patient Age:    18 years          BP:           160/58 mmHg Patient Gender: F                 HR:           86 bpm. Exam Location:  [address] Procedure: 2D Echo, 3D Echo, Cardiac Doppler and Color Doppler Indications:     Z95.2 Status Post TAVR  History:         Patient has prior history of Echocardiogram examinations, most                  recent 01/28/2023. CAD and Previous Myocardial Infarction, Aortic                  Valve Disease, Arrythmias:Atrial Fibrillation,                  Signs/Symptoms:Dizziness/Lightheadedness; Risk Factors:Family                  History of Coronary Artery Disease, Hypertension, Diabetes,                  Dyslipidemia and Former Smoker. Aortic Stenosis status post  TAVR (01-27-23, 80mm Edwards S3UR), NSTEMI.                  Aortic Valve: 23 mm Sapien prosthetic, stented (TAVR) valve is                  present in the aortic position.  Sonographer:     Deliah Boston RDCS Referring Phys:  Alphonsa Overall MCDANIEL Diagnosing Phys: Skeet Latch MD IMPRESSIONS  1. Left ventricular ejection fraction, by estimation, is 70 to 75%. The left ventricle has hyperdynamic function. The left ventricle has no regional wall motion abnormalities. Left ventricular diastolic parameters are indeterminate.  2. Right ventricular systolic function is normal. The right ventricular size is normal. There is moderately elevated pulmonary artery systolic pressure.  3. Left atrial size was severely dilated.  4. Right atrial size was severely dilated.  5. The mitral valve is normal in structure. Trivial mitral valve regurgitation. No evidence of mitral stenosis.  6.  Tricuspid valve regurgitation is severe.  7. The aortic valve has been repaired/replaced. Aortic valve regurgitation is mild. No aortic stenosis is present. There is a 23 mm Sapien prosthetic (TAVR) valve present in the aortic position. Aortic regurgitation PHT measures 335 msec. Aortic valve area, by VTI measures 1.36 cm. Aortic valve mean gradient measures 9.0 mmHg. Aortic valve Vmax measures 2.03 m/s.  8. The inferior vena cava is normal in size with greater than 50% respiratory variability, suggesting right atrial pressure of 3 mmHg.  9. Cannot exclude a small PFO. FINDINGS  Left Ventricle: Left ventricular ejection fraction, by estimation, is 70 to 75%. The left ventricle has hyperdynamic function. The left ventricle has no regional wall motion abnormalities. The left ventricular internal cavity size was normal in size. There is no left ventricular hypertrophy. Left ventricular diastolic function could not be evaluated due to atrial fibrillation. Left ventricular diastolic parameters are indeterminate. Right Ventricle: The right ventricular size is normal. No increase in right ventricular wall thickness. Right ventricular systolic function is normal. There is moderately elevated pulmonary artery systolic pressure. The tricuspid regurgitant velocity is 3.38 m/s, and with an assumed right atrial pressure of 3 mmHg, the estimated right ventricular systolic pressure is [id number] mmHg. Left Atrium: Left atrial size was severely dilated. Right Atrium: Right atrial size was severely dilated. Pericardium: There is no evidence of pericardial effusion. Mitral Valve: The mitral valve is normal in structure. There is mild thickening of the mitral valve leaflet(s). Trivial mitral valve regurgitation. No evidence of mitral valve stenosis. Tricuspid Valve: The tricuspid valve is normal in structure. Tricuspid valve regurgitation is severe. No evidence of tricuspid stenosis. Aortic Valve: The aortic valve has been  repaired/replaced. Aortic valve regurgitation is mild. Aortic regurgitation PHT measures 335 msec. No aortic stenosis is present. Aortic valve mean gradient measures 9.0 mmHg. Aortic valve peak gradient measures 16.5 mmHg. Aortic valve area, by VTI measures 1.36 cm. There is a 23 mm Sapien prosthetic, stented (TAVR) valve present in the aortic position. Pulmonic Valve: The pulmonic valve was normal in structure. Pulmonic valve regurgitation is not visualized. No evidence of pulmonic stenosis. Aorta: The aortic root is normal in size and structure. Venous: The inferior vena cava is normal in size with greater than 50% respiratory variability, suggesting right atrial pressure of 3 mmHg. IAS/Shunts: Cannot exclude a small PFO.  LEFT VENTRICLE PLAX 2D LVIDd:         3.50 cm   Diastology LVIDs:         2.00  cm   LV e' medial:    9.89 cm/s LV PW:         1.10 cm   LV E/e' medial:  14.4 LV IVS:        1.00 cm   LV e' lateral:   12.60 cm/s LVOT diam:     1.90 cm   LV E/e' lateral: 11.3 LV SV:         57 LV SV Index:   34 LVOT Area:     2.84 cm                           3D Volume EF:                          3D EF:        63 %                          LV EDV:       99 ml                          LV ESV:       37 ml                          LV SV:        62 ml RIGHT VENTRICLE RV Basal diam:  4.90 cm RV Mid diam:    4.00 cm RV S prime:     18.10 cm/s TAPSE (M-mode): 1.8 cm RVSP:           53.7 mmHg LEFT ATRIUM              Index        RIGHT ATRIUM           Index LA diam:        5.60 cm  3.38 cm/m   RA Pressure: 8.00 mmHg LA Vol (A2C):   103.0 ml 62.25 ml/m  RA Area:     37.40 cm LA Vol (A4C):   106.0 ml 64.06 ml/m  RA Volume:   163.00 ml 98.51 ml/m LA Biplane Vol: 108.0 ml 65.27 ml/m  AORTIC VALVE AV Area (Vmax):    1.30 cm AV Area (Vmean):   1.35 cm AV Area (VTI):     1.36 cm AV Vmax:           203.00 cm/s AV Vmean:          140.000 cm/s AV VTI:            0.418 m AV Peak Grad:      16.5 mmHg AV Mean Grad:       9.0 mmHg LVOT Vmax:         93.43 cm/s LVOT Vmean:        66.600 cm/s LVOT VTI:          0.201 m LVOT/AV VTI ratio: 0.48 AI PHT:            335 msec  AORTA Ao Root diam: 2.90 cm Ao Asc diam:  3.20 cm MITRAL VALVE                TRICUSPID VALVE MV Area (PHT): cm          TR Peak grad:   45.7 mmHg MV Decel  Time: 205 msec     TR Vmax:        338.00 cm/s MR Peak grad: 95.6 mmHg     Estimated RAP:  8.00 mmHg MR Mean grad: 64.0 mmHg     RVSP:           53.7 mmHg MR Vmax:      489.00 cm/s MR Vmean:     377.0 cm/s    SHUNTS MV E velocity: 142.00 cm/s  Systemic VTI:  0.20 m                             Systemic Diam: 1.90 cm Skeet Latch MD Electronically signed by Skeet Latch MD Signature Date/Time: 03/04/2023/10:23:16 PM    Final (Updated)     Assessment   GERD: complains of belching. No longer having epigastric pain. No heartburn. Appetite improved. EGD up to date. Recommend decreasing carbonated beverages.   Anemia: Hgb normal 02/2022. In 07/2022 Hgb 9-10 range. We evaluated for melena last fall, was heme positive at that time as well. EGD findings as outlined above. Colonoscopy advised but on hold until after TAVR, she just completed last month, complicated by extraperitoneal hematoma. Also requiring prbcs after her TAVR. Recent Hgb back to near her baseline. No overt GI bleeding.   Fecal incontinence: somewhat less of an issue right now. Felt to be due to weakened sphincteric mechanisms. She did not try imodium for fear of constipation. Willing to try low dose ie 1mg  daily to start out with.   Perianal irritation/rash: likely due to persistent moisture/urinary incontinence. Mild erythema with no skin breakdown on exam today. Recommend changing depends/pads more regularly especially in the evenings, sometimes going 12 hours at a time.   Abnormal liver on u/s: irregular liver margin. Normal spleen. Moderate ascites. All noted on u/s in 10/2022. CTA Abd 11/2022: slightly shrunken appearance of liver and  nodular contour s/o underlying cirrhosis. CTA 01/2023: question of changes of hepatic congestion given amt of reflux of contrast down IVC and into hepatic veins. No evidence of portal hypertension on EGD. Recommend conservative approach.   The patient was found to have elevated blood pressure when vital signs were checked in the office. The blood pressure was rechecked by the nursing staff and it was found be persistently elevated >140/90 mmHg. I personally advised to the patient to follow up closely with his PCP for hypertension control.   PLAN   Continue pantoprazole 40mg  BID. Trial of imodium 1/2 tablet once daily.  Trial of diaper rash cream apply as needed. Try to stay dry, change pads/depends on regular basis.  Recheck CBC in 6 weeks.  Return ov in 6-8 weeks, consider further work up of anemia at that time.   Laureen Ochs. Bobby Rumpf, [place], [facility]

## 2023-03-11 ENCOUNTER — Other Ambulatory Visit: Payer: Medicare Other

## 2023-03-11 DIAGNOSIS — K219 Gastro-esophageal reflux disease without esophagitis: Secondary | ICD-10-CM | POA: Diagnosis not present

## 2023-03-11 DIAGNOSIS — D649 Anemia, unspecified: Secondary | ICD-10-CM | POA: Diagnosis not present

## 2023-03-12 LAB — CBC WITH DIFFERENTIAL/PLATELET
Basophils Absolute: 0 10*3/uL (ref 0.0–0.2)
Basos: 1 %
EOS (ABSOLUTE): 0.1 10*3/uL (ref 0.0–0.4)
Eos: 2 %
Hematocrit: 30.4 % — ABNORMAL LOW (ref 34.0–46.6)
Hemoglobin: 9.6 g/dL — ABNORMAL LOW (ref 11.1–15.9)
Immature Grans (Abs): 0 10*3/uL (ref 0.0–0.1)
Immature Granulocytes: 0 %
Lymphocytes Absolute: 0.7 10*3/uL (ref 0.7–3.1)
Lymphs: 17 %
MCH: 29.4 pg (ref 26.6–33.0)
MCHC: 31.6 g/dL (ref 31.5–35.7)
MCV: 93 fL (ref 79–97)
Monocytes Absolute: 0.4 10*3/uL (ref 0.1–0.9)
Monocytes: 8 %
Neutrophils Absolute: 3.1 10*3/uL (ref 1.4–7.0)
Neutrophils: 72 %
Platelets: 182 10*3/uL (ref 150–450)
RBC: 3.27 x10E6/uL — ABNORMAL LOW (ref 3.77–5.28)
RDW: 15.1 % (ref 11.7–15.4)
WBC: 4.3 10*3/uL (ref 3.4–10.8)

## 2023-03-20 ENCOUNTER — Other Ambulatory Visit: Payer: Self-pay | Admitting: Family Medicine

## 2023-03-23 ENCOUNTER — Other Ambulatory Visit: Payer: Self-pay

## 2023-03-23 ENCOUNTER — Telehealth: Payer: Self-pay

## 2023-03-23 DIAGNOSIS — D649 Anemia, unspecified: Secondary | ICD-10-CM

## 2023-03-23 NOTE — Telephone Encounter (Signed)
Rakes patient Last office visit 08/20/22 Medication listed as historical and requires provider's approval

## 2023-03-23 NOTE — Telephone Encounter (Signed)
  Prescription Request  03/23/2023  Is this a "Controlled Substance" medicine? no  Have you seen your PCP in the last 2 weeks? no  If YES, route message to pool  -  If NO, patient needs to be scheduled for appointment.  What is the name of the medication or equipment? ELIQUIS 2.5 MG TABS tablet   Have you contacted your pharmacy to request a refill? yes   Which pharmacy would you like this sent to? [facility], pt has already missed one dose   Patient notified that their request is being sent to the clinical staff for review and that they should receive a response within 2 business days.

## 2023-03-23 NOTE — Telephone Encounter (Signed)
Received voicemail from the patient's daughter, [name] requesting a call.  Left message to call back.

## 2023-03-24 MED ORDER — APIXABAN 2.5 MG PO TABS: 2.5000 mg | ORAL_TABLET | Freq: Two times a day (BID) | ORAL | 3 refills | Status: DC

## 2023-03-24 NOTE — Telephone Encounter (Signed)
Stephanie requests Eliquis refills for her mom.  Refills sent.

## 2023-03-24 NOTE — Addendum Note (Signed)
Addended by: Harland German A on: 03/24/2023 02:00 PM   Modules accepted: Orders

## 2023-04-20 ENCOUNTER — Other Ambulatory Visit: Payer: Self-pay

## 2023-04-20 DIAGNOSIS — D649 Anemia, unspecified: Secondary | ICD-10-CM

## 2023-05-05 ENCOUNTER — Ambulatory Visit (INDEPENDENT_AMBULATORY_CARE_PROVIDER_SITE_OTHER): Payer: Medicare Other

## 2023-05-05 ENCOUNTER — Ambulatory Visit (INDEPENDENT_AMBULATORY_CARE_PROVIDER_SITE_OTHER): Payer: Medicare Other | Admitting: Nurse Practitioner

## 2023-05-05 ENCOUNTER — Encounter: Payer: Self-pay | Admitting: Nurse Practitioner

## 2023-05-05 VITALS — BP 95/48 | HR 82 | Temp 97.2°F | Resp 20 | Ht 65.0 in | Wt 127.0 lb

## 2023-05-05 DIAGNOSIS — R051 Acute cough: Secondary | ICD-10-CM

## 2023-05-05 DIAGNOSIS — R059 Cough, unspecified: Secondary | ICD-10-CM | POA: Diagnosis not present

## 2023-05-05 DIAGNOSIS — D649 Anemia, unspecified: Secondary | ICD-10-CM | POA: Diagnosis not present

## 2023-05-05 MED ORDER — BENZONATATE 100 MG PO CAPS: 100.0000 mg | ORAL_CAPSULE | Freq: Three times a day (TID) | ORAL | 0 refills | Status: DC | PRN

## 2023-05-05 MED ORDER — AZITHROMYCIN 250 MG PO TABS: ORAL_TABLET | ORAL | 0 refills | Status: DC

## 2023-05-05 NOTE — Progress Notes (Signed)
Subjective:    Patient ID: [name], female    DOB: Jul 29, 1936, 87 y.o.   MRN: [id number]  Persistent cough x 5 days; started off dry but started getting thick, tan sputum yesterday; no fever, chills, SOB or chest pain; does have some rhinorrhea. Has tried OTC allergy and cough medicine with no relief.   Cough This is a new problem. The current episode started in the past 7 days. The problem has been unchanged. The problem occurs every few minutes. The cough is Productive of sputum. Associated symptoms include rhinorrhea. Pertinent negatives include no chills, fever, headaches, myalgias, nasal congestion, postnasal drip or sore throat. Nothing aggravates the symptoms. She has tried OTC cough suppressant for the symptoms. The treatment provided no relief.      Review of Systems  Constitutional:  Negative for chills and fever.  HENT:  Positive for rhinorrhea. Negative for postnasal drip and sore throat.   Respiratory:  Positive for cough.   Musculoskeletal:  Negative for myalgias.  Neurological:  Negative for headaches.       Objective:   Physical Exam Vitals reviewed.  Constitutional:      Appearance: Normal appearance.  Cardiovascular:     Rate and Rhythm: Normal rate and regular rhythm.  Pulmonary:     Effort: Pulmonary effort is normal.     Breath sounds: Rales (insp throughout- louder on right then left) present.  Skin:    General: Skin is warm.  Neurological:     General: No focal deficit present.     Mental Status: She is alert and oriented to person, place, and time.  Psychiatric:        Mood and Affect: Mood normal.        Behavior: Behavior normal.    BP (!) 95/48   Pulse 82   Temp (!) 97.2 F (36.2 C) (Temporal)   Resp 20   Ht 5\' 5"  (1.651 m)   Wt 127 lb (57.6 kg)   SpO2 99%   BMI 21.13 kg/m   Chest xray- radiology report pending-Preliminary reading by Paulene Floor, FNP  [facility]       Assessment & Plan:   [name] in today with chief  complaint of Cough and Nasal Congestion   1. Acute cough 1. Take meds as prescribed 2. Use a cool mist humidifier especially during the winter months and when heat has been humid. 3. Use saline nose sprays frequently 4. Saline irrigations of the nose can be very helpful if done frequently.  * 4X daily for 1 week*  * Use of a nettie pot can be helpful with this. Follow directions with this* 5. Drink plenty of fluids 6. Keep thermostat turn down low 7.For any cough or congestion- tessalon perles 8. For fever or aces or pains- take tylenol or ibuprofen appropriate for age and weight.  * for fevers greater than 101 orally you may alternate ibuprofen and tylenol every  3 hours.    - azithromycin (ZITHROMAX Z-PAK) 250 MG tablet; As directed  Dispense: 6 tablet; Refill: 0 - benzonatate (TESSALON PERLES) 100 MG capsule; Take 1 capsule (100 mg total) by mouth 3 (three) times daily as needed for cough.  Dispense: 20 capsule; Refill: 0 - DG Chest 2 View    The above assessment and management plan was discussed with the patient. The patient verbalized understanding of and has agreed to the management plan. Patient is aware to call the clinic if symptoms persist or worsen. Patient is  aware when to return to the clinic for a follow-up visit. Patient educated on when it is appropriate to go to the emergency department.   Mary-Margaret Hassell Done, FNP

## 2023-05-06 LAB — CBC WITH DIFFERENTIAL/PLATELET
Basophils Absolute: 0 10*3/uL (ref 0.0–0.2)
Basos: 1 %
EOS (ABSOLUTE): 0.2 10*3/uL (ref 0.0–0.4)
Eos: 3 %
Hematocrit: 28.5 % — ABNORMAL LOW (ref 34.0–46.6)
Hemoglobin: 8.9 g/dL — ABNORMAL LOW (ref 11.1–15.9)
Immature Grans (Abs): 0 10*3/uL (ref 0.0–0.1)
Immature Granulocytes: 0 %
Lymphocytes Absolute: 0.7 10*3/uL (ref 0.7–3.1)
Lymphs: 14 %
MCH: 28.4 pg (ref 26.6–33.0)
MCHC: 31.2 g/dL — ABNORMAL LOW (ref 31.5–35.7)
MCV: 91 fL (ref 79–97)
Monocytes Absolute: 0.5 10*3/uL (ref 0.1–0.9)
Monocytes: 10 %
Neutrophils Absolute: 3.8 10*3/uL (ref 1.4–7.0)
Neutrophils: 72 %
Platelets: 242 10*3/uL (ref 150–450)
RBC: 3.13 x10E6/uL — ABNORMAL LOW (ref 3.77–5.28)
RDW: 13.7 % (ref 11.7–15.4)
WBC: 5.2 10*3/uL (ref 3.4–10.8)

## 2023-05-08 ENCOUNTER — Other Ambulatory Visit: Payer: Self-pay | Admitting: Family Medicine

## 2023-05-12 ENCOUNTER — Telehealth: Payer: Self-pay | Admitting: Cardiology

## 2023-05-12 ENCOUNTER — Other Ambulatory Visit: Payer: Self-pay

## 2023-05-12 DIAGNOSIS — D649 Anemia, unspecified: Secondary | ICD-10-CM

## 2023-05-12 NOTE — Telephone Encounter (Signed)
   Patient Name: [name]  DOB: [birth year] MRN: [id number]  Primary Cardiologist: Bryan Lemma, MD  Chart reviewed as part of pre-operative protocol coverage.   Simple dental extractions (i.e. 1-2 teeth) are considered low risk procedures per guidelines and generally do not require any specific cardiac clearance. It is also generally accepted that for simple extractions and dental cleanings, there is no need to interrupt blood thinner therapy.  SBE prophylaxis is required for the patient from a cardiac standpoint.  I will route this recommendation to the requesting party via Epic fax function and remove from pre-op pool.  Please call with questions.  Joylene Grapes, NP 05/12/2023, 3:47 PM

## 2023-05-12 NOTE — Telephone Encounter (Signed)
   Pre-operative Risk Assessment    Patient Name: [name]  DOB: 03/01/1936 MRN: [id number]     Request for Surgical Clearance    Procedure:  Dental Extraction - Amount of Teeth to be Pulled:  1  Date of Surgery:  Clearance 05/12/23                                 Surgeon:  Dr. Juanetta Gosling    Surgeon's Group or Practice Name:  [facility]  Phone number:  [phone] Fax number:  [phone]   Type of Clearance Requested:   - Medical  - Pharmacy:  Hold        Type of Anesthesia:  Local     Additional requests/questions:      SignedFilomena Jungling   05/12/2023, 3:38 PM

## 2023-05-13 NOTE — Telephone Encounter (Signed)
I will forward back to pre op APP for review.

## 2023-05-13 NOTE — Telephone Encounter (Signed)
[name] called from Dr. [facility] office called to confirm to say that patient tooth will be surgically extracted, that is will not be a simple extraction.  That  we still do not want patient to hold her blood therapy.

## 2023-05-14 MED ORDER — AMOXICILLIN 500 MG PO CAPS: ORAL_CAPSULE | ORAL | 5 refills | Status: DC

## 2023-05-14 NOTE — Telephone Encounter (Signed)
   Patient Name: [name]  DOB: Apr 26, 1936 MRN: [id number]  Primary Cardiologist: Bryan Lemma, MD  Clinical pharmacists have reviewed the patient's past medical history, labs, and current medications as part of preoperative protocol coverage. The following recommendations have been made:   Patient does require pre-op antibiotics for dental procedure given hx of TAVR 01/2023. I do not see that she has an active rx on file. Please let pt know that rx for amoxicillin has been sent to her pharmacy.  Still should be ok to continue anticoag for single dental extraction unless dentist feels strongly about anticoag hold, in which case can hold Eliquis for 1 day prior to procedure.     I will route this recommendation to the requesting party via Epic fax function and remove from pre-op pool.  Please call with questions.  Napoleon Form, Leodis Rains, NP 05/14/2023, 12:31 PM

## 2023-05-14 NOTE — Telephone Encounter (Signed)
Patient with diagnosis of afib on Eliquis for anticoagulation.    Procedure: single dental extraction Date of procedure: 05/12/23  CHA2DS2-VASc Score = 7  This indicates a 11.2% annual risk of stroke. 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 I do not see that she has an active rx on file. Please let pt know that rx for amoxicillin has been sent to her pharmacy.  Still should be ok to continue anticoag for single dental extraction unless dentist feels strongly about anticoag hold, in which case can hold Eliquis for 1 day prior to procedure.   **This guidance is not considered finalized until pre-operative APP has relayed final recommendations.**

## 2023-05-14 NOTE — Telephone Encounter (Signed)
I will send back to pre op APP and pre op Pharm-d as pt has called today checking on clearance.

## 2023-05-14 NOTE — Telephone Encounter (Signed)
Caller is following-up on status of patient's clearance for extractions.

## 2023-05-14 NOTE — Addendum Note (Signed)
Addended by: Franciso Dierks E on: 05/14/2023 12:25 PM   Modules accepted: Orders

## 2023-05-19 ENCOUNTER — Ambulatory Visit: Payer: Medicare Other | Admitting: Gastroenterology

## 2023-05-26 ENCOUNTER — Ambulatory Visit: Payer: Medicare Other | Admitting: Internal Medicine

## 2023-05-26 ENCOUNTER — Ambulatory Visit: Payer: Medicare Other | Admitting: Gastroenterology

## 2023-05-26 ENCOUNTER — Encounter: Payer: Self-pay | Admitting: Gastroenterology

## 2023-05-26 VITALS — BP 198/79 | HR 77 | Temp 98.0°F | Ht 65.0 in | Wt 125.2 lb

## 2023-05-26 DIAGNOSIS — D649 Anemia, unspecified: Secondary | ICD-10-CM | POA: Diagnosis not present

## 2023-05-26 NOTE — Progress Notes (Signed)
GI Office Note    Referring Provider: Sonny Masters, FNP Primary Care Physician:  Sonny Masters, FNP  Primary Gastroenterologist: Roetta Sessions, MD   Chief Complaint   Chief Complaint  Patient presents with   Follow-up    Doing better    History of Present Illness   [name] is a 87 y.o. female presenting today for follow-up.  Last seen in March 2024.  EGD September 2023, noted to have gastritis, no H. pylori.  Abdominal ultrasound November 2023 showed nodular liver border.  History of fecal incontinence, intermittent, sometimes formed stools.  On rectal exam, moderately good sphincter tone, poor squeeze, no impaction.  Started Imodium 2 mg twice daily to improve continence.  Plans for colonoscopy after aortic valve repair.  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 Per cardiology note, she had post op anemia with a Hb at 6.3 however no other overt s/s of acute bleeding. Patient received 2u PRBCs with improvement in Hb to 8.7.  Felt to be dilutional per Dr. Laneta Simmers. Hb trended 9.5>>8.6>>8.6. She was kept additional time due to this and was ultimately discharged home with her daughter. She then called the office with severe right sided abdominal pain. CT imaging showed a large extraperitoneal hematoma measuring 20 x 13 x 6.5 cm. Suspected changes of hepatic congestion given amount of contrast down the IVC and into hepatic veins. She was treated with pain medication and [facility] was consulted with no surgical recommendations. Plan was to hold Eliquis at least 1 week possibly 2 weeks depending on her clinical progress.    Today:doing better. Appetite returning. Less fatigue but still feels tired. Less swelling. No postprandial abd pain. Notes abd pain with bouncing in a care, present since 10/2022. Stool consistency much better. Taking imodium one per day. Stools more formed, not hard. However not having BM daily. No melena, brbpr. No hb, dysphagia, vomiting.  EGD November 2023 for  abdominal pain: -Gastritis, reactive gastropathy on path, negative for H. pylori   Medications   Current Outpatient Medications  Medication Sig Dispense Refill   apixaban (ELIQUIS) 2.5 MG TABS tablet Take 1 tablet (2.5 mg total) by mouth 2 (two) times daily. 180 tablet 3   atorvastatin (LIPITOR) 20 MG tablet TAKE ONE (1) TABLET EACH DAY 90 tablet 2   benazepril (LOTENSIN) 40 MG tablet Take 40 mg by mouth daily.     carvedilol (COREG) 25 MG tablet TAKE ONE TABLET BY MOUTH TWICE DAILY 180 tablet 3   diphenhydramine-acetaminophen (TYLENOL PM) 25-500 MG TABS tablet Take 2 tablets by mouth at bedtime.     furosemide (LASIX) 40 MG tablet Take 1 tablet (40 mg total) by mouth daily as needed for fluid. 30 tablet 3   hydrALAZINE (APRESOLINE) 100 MG tablet Take 1 tablet (100 mg total) by mouth 2 (two) times daily. (NEEDS TO BE SEEN BEFORE NEXT REFILL) 60 tablet 0   loperamide (IMODIUM A-D) 2 MG tablet Take 2 mg by mouth 4 (four) times daily as needed for diarrhea or loose stools.     NITROSTAT 0.4 MG SL tablet DISSOLVE 1 TAB UNDER TOUNGE FOR CHEST PAIN. MAY REPEAT EVERY 5 MINUTES FOR 3 DOSES. IF NO RELIEF CALL 911 OR GO TO ER 25 tablet 3   pantoprazole (PROTONIX) 40 MG tablet TAKE 1 TABLET BY MOUTH TWICE DAILY BEFORE MEALS 60 tablet 3   sertraline (ZOLOFT) 25 MG tablet Take 25 mg by mouth daily.     No current facility-administered medications for  this visit.    Allergies   Allergies as of 05/26/2023 - Review Complete 05/26/2023  Allergen Reaction Noted   Valium Nausea And Vomiting 11/27/2011      Review of Systems   General: Negative for anorexia, weight loss, fever, chills, fatigue, weakness. ENT: Negative for hoarseness, difficulty swallowing , nasal congestion. CV: Negative for chest pain, angina, palpitations, dyspnea on exertion, peripheral edema.  Respiratory: Negative for dyspnea at rest, dyspnea on exertion, cough, sputum, wheezing.  GI: See history of present illness. GU:   Negative for dysuria, hematuria, urinary incontinence, urinary frequency, nocturnal urination.  Endo: Negative for unusual weight change.     Physical Exam   BP (!) 198/79   Pulse 77   Temp 98 F (36.7 C) (Oral)   Ht 5\' 5"  (1.651 m)   Wt 125 lb 3.2 oz (56.8 kg)   SpO2 97%   BMI 20.83 kg/m    General: Well-nourished, well-developed in no acute distress. Slightly pale. Eyes: No icterus. Mouth: Oropharyngeal mucosa moist and pink , no lesions erythema or exudate. Lungs: Clear to auscultation bilaterally.  Heart: Regular rate and rhythm, no murmurs rubs or gallops.  Abdomen: Bowel sounds are normal, nontender, nondistended, no hepatosplenomegaly or masses,  no abdominal bruits or hernia , no rebound or guarding.  Rectal: not performed. Extremities: trace bilateral lower extremity edema. No clubbing or deformities. Neuro: Alert and oriented x 4   Skin: Warm and dry, no jaundice.   Psych: Alert and cooperative, normal mood and affect.  Labs   Lab Results  Component Value Date   CREATININE 0.78 02/04/2023   BUN 11 02/04/2023   NA 137 02/04/2023   K 4.0 02/04/2023   CL 99 02/04/2023   CO2 26 02/04/2023   Lab Results  Component Value Date   ALT 8 02/03/2023   AST 17 02/03/2023   ALKPHOS 53 02/03/2023   BILITOT 1.4 (H) 02/03/2023   Lab Results  Component Value Date   WBC 5.2 05/05/2023   HGB 8.9 (L) 05/05/2023   HCT 28.5 (L) 05/05/2023   MCV 91 05/05/2023   PLT 242 05/05/2023   Lab Results  Component Value Date   LIPASE 39 02/03/2023    Imaging Studies   DG Chest 2 View  Result Date: 05/09/2023 CLINICAL DATA:  cough EXAM: CHEST - 2 VIEW COMPARISON:  02/03/2023 FINDINGS: Cardiac silhouette enlarged. No evidence of pneumothorax or pleural effusion. No evidence of pulmonary edema. Scattered calcific densities overlying the lungs and hila consistent with old granulomatous disease. Osseous structures are osteopenic. There is an aortic valve prosthesis. IMPRESSION:  Enlarged cardiac silhouette. Otherwise no acute cardiopulmonary process identified. Electronically Signed   By: Layla Maw M.D.   On: 05/09/2023 13:43    Assessment   GERD: doing better.   Anemia: Hgb normal 02/2022. In 07/2022 Hgb 9-10 range. We evaluated for melena last fall, was heme positive at that time as well. EGD findings as outlined above. Colonoscopy advised but on hold until after TAVR, she just completed earlier this year, complicated by extraperitoneal hematoma. Also requiring prbcs after her TAVR.  No overt GI bleeding. Due for updated labs. patient's daughter prefers holding off on colonoscopy if possible.   Fecal continence: improved with imodium. Some days no stools, will have her back down to 1/2 tablet to 1 tablet daily.   Abnormal liver on imagine: suspected cirrhosis. No portal HTN on EGD. Conservative approach recommended due to age.   The patient was found to have elevated  blood pressure when vital signs were checked in the office. The blood pressure was rechecked by the nursing staff and it was found be persistently elevated >140/90 mmHg. I personally advised to the patient to follow up closely with his PCP for hypertension control.  PLAN   Update labs. Continue pantoprazole BID before meals. Imodium 1/2 to 1 tablet daily.   Leanna Battles. Melvyn Neth, MHS, PA-C [facility]

## 2023-05-26 NOTE — Patient Instructions (Signed)
Please have labs done at your convenience. Once results return we will be in touch with further recommendations. I will be on vacation next week, so it may be the week June 17th before you hear from [place]!

## 2023-05-28 ENCOUNTER — Other Ambulatory Visit: Payer: Medicare Other

## 2023-05-28 DIAGNOSIS — D649 Anemia, unspecified: Secondary | ICD-10-CM | POA: Diagnosis not present

## 2023-05-28 DIAGNOSIS — B351 Tinea unguium: Secondary | ICD-10-CM | POA: Diagnosis not present

## 2023-05-28 DIAGNOSIS — M79676 Pain in unspecified toe(s): Secondary | ICD-10-CM | POA: Diagnosis not present

## 2023-05-29 LAB — CBC WITH DIFFERENTIAL/PLATELET
Basophils Absolute: 0 10*3/uL (ref 0.0–0.2)
Basos: 1 %
EOS (ABSOLUTE): 0.2 10*3/uL (ref 0.0–0.4)
Eos: 4 %
Hematocrit: 31.5 % — ABNORMAL LOW (ref 34.0–46.6)
Hemoglobin: 10 g/dL — ABNORMAL LOW (ref 11.1–15.9)
Immature Grans (Abs): 0 10*3/uL (ref 0.0–0.1)
Immature Granulocytes: 0 %
Lymphocytes Absolute: 0.9 10*3/uL (ref 0.7–3.1)
Lymphs: 16 %
MCH: 28.7 pg (ref 26.6–33.0)
MCHC: 31.7 g/dL (ref 31.5–35.7)
MCV: 90 fL (ref 79–97)
Monocytes Absolute: 0.4 10*3/uL (ref 0.1–0.9)
Monocytes: 7 %
Neutrophils Absolute: 3.9 10*3/uL (ref 1.4–7.0)
Neutrophils: 72 %
Platelets: 207 10*3/uL (ref 150–450)
RBC: 3.49 x10E6/uL — ABNORMAL LOW (ref 3.77–5.28)
RDW: 14.9 % (ref 11.7–15.4)
WBC: 5.5 10*3/uL (ref 3.4–10.8)

## 2023-05-29 LAB — IRON,TIBC AND FERRITIN PANEL
Ferritin: 160 ng/mL — ABNORMAL HIGH (ref 15–150)
Iron Saturation: 20 % (ref 15–55)
Iron: 51 ug/dL (ref 27–139)
Total Iron Binding Capacity: 252 ug/dL (ref 250–450)
UIBC: 201 ug/dL (ref 118–369)

## 2023-06-09 ENCOUNTER — Other Ambulatory Visit: Payer: Self-pay | Admitting: Family Medicine

## 2023-06-09 ENCOUNTER — Other Ambulatory Visit: Payer: Self-pay | Admitting: Gastroenterology

## 2023-06-09 DIAGNOSIS — K921 Melena: Secondary | ICD-10-CM

## 2023-06-09 DIAGNOSIS — Z8711 Personal history of peptic ulcer disease: Secondary | ICD-10-CM

## 2023-06-24 ENCOUNTER — Ambulatory Visit (INDEPENDENT_AMBULATORY_CARE_PROVIDER_SITE_OTHER): Payer: Medicare Other

## 2023-06-24 VITALS — Ht 65.0 in | Wt 125.0 lb

## 2023-06-24 DIAGNOSIS — Z0000 Encounter for general adult medical examination without abnormal findings: Secondary | ICD-10-CM | POA: Diagnosis not present

## 2023-06-24 DIAGNOSIS — Z780 Asymptomatic menopausal state: Secondary | ICD-10-CM | POA: Diagnosis not present

## 2023-06-24 NOTE — Progress Notes (Signed)
Subjective:   [name] is a 87 y.o. female who presents for an Initial Medicare Annual Wellness Visit.  Visit Complete: Virtual  I connected with  [name] on 06/24/23 by a audio enabled telemedicine application and verified that I am speaking with the correct person using two identifiers.  Patient Location: Home  Provider Location: Home Office  I discussed the limitations of evaluation and management by telemedicine. The patient expressed understanding and agreed to proceed.  Patient Medicare AWV questionnaire was completed by the patient on 06/24/2023; I have confirmed that all information answered by patient is correct and no changes since this date.  Review of Systems     Cardiac Risk Factors include: advanced age (>87men, >64 women);dyslipidemia;hypertension     Objective:    Today's Vitals   06/24/23 1200  Weight: 125 lb (56.7 kg)  Height: 5\' 5"  (1.651 m)   Body mass index is 20.8 kg/m.     06/24/2023   12:05 PM 02/03/2023   10:21 AM 01/27/2023    8:25 AM 11/24/2022    6:52 AM 11/17/2022   11:39 AM 09/15/2022   11:33 AM 06/18/2022   12:03 PM  Advanced Directives  Does Patient Have a Medical Advance Directive? Yes No No No No No Yes  Type of Estate agent of [place];Living will      Healthcare Power of [place];Living will  Copy of Healthcare Power of Attorney in Chart? No - copy requested      No - copy requested  Would patient like information on creating a medical advance directive?   No - Patient declined No - Patient declined No - Patient declined No - Patient declined     Current Medications (verified) Outpatient Encounter Medications as of 06/24/2023  Medication Sig   apixaban (ELIQUIS) 2.5 MG TABS tablet Take 1 tablet (2.5 mg total) by mouth 2 (two) times daily.   atorvastatin (LIPITOR) 20 MG tablet TAKE ONE (1) TABLET EACH DAY   benazepril (LOTENSIN) 40 MG tablet Take 40 mg by mouth daily.   carvedilol (COREG) 25 MG  tablet Take 1 tablet (25 mg total) by mouth 2 (two) times daily. (NEEDS TO BE SEEN BEFORE NEXT REFILL)   diphenhydramine-acetaminophen (TYLENOL PM) 25-500 MG TABS tablet Take 2 tablets by mouth at bedtime.   furosemide (LASIX) 40 MG tablet Take 1 tablet (40 mg total) by mouth daily as needed for fluid.   hydrALAZINE (APRESOLINE) 100 MG tablet Take 1 tablet (100 mg total) by mouth 2 (two) times daily. (NEEDS TO BE SEEN BEFORE NEXT REFILL)   loperamide (IMODIUM A-D) 2 MG tablet Take 2 mg by mouth 4 (four) times daily as needed for diarrhea or loose stools.   NITROSTAT 0.4 MG SL tablet DISSOLVE 1 TAB UNDER TOUNGE FOR CHEST PAIN. MAY REPEAT EVERY 5 MINUTES FOR 3 DOSES. IF NO RELIEF CALL 911 OR GO TO ER   pantoprazole (PROTONIX) 40 MG tablet TAKE 1 TABLET BY MOUTH TWICE DAILY BEFORE MEALS   sertraline (ZOLOFT) 25 MG tablet Take 25 mg by mouth daily.   No facility-administered encounter medications on file as of 06/24/2023.    Allergies (verified) Valium   History: Past Medical History:  Diagnosis Date   A-fib (HCC)    Anxiety    CAD S/P percutaneous coronary angioplasty 1990s, 11/28/2011    a) midRCA PCI 1999 (NIR BMS 3.0 mm x 32 mm); distal  RCA BMS Sept 2012 (Vision BMS 2.5 mm x 15 mm); 12/02:  RCA ISR --> Cutting PTCA    Coronary stent restenosis due to progression of disease 11/30/2011   Dyslipidemia, goal LDL below 70 11/28/2011   Essential hypertension    On ACE inhibitor and ARB? As well as atenolol and hydralazine   Gastric ulcer 11/2018   GERD (gastroesophageal reflux disease)    History of blood transfusion    without reaction   Moderate aortic stenosis by prior echocardiogram 08/2011; 08/2013   Echo 11/09/18: EF 60-65%. Gr 2 DD.  No RWMA.  Mild-Mod AS (mean gradient 19 mmHg). Mild-Mod PAH. 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 Clifton James and Dr. Laneta Simmers   Type 2 diabetes mellitus with complication, without long-term current use of insulin (HCC)    no longer taking diabetic medication - diet controlled   Past Surgical History:  Procedure Laterality Date   ABDOMINAL HYSTERECTOMY     BIOPSY  06/03/2018   Procedure: BIOPSY;  Surgeon: Malissa Hippo, MD;  Location: [facility];  Service: Endoscopy;;  gastric    BIOPSY  09/15/2022   Procedure: BIOPSY;  Surgeon: Lanelle Bal, DO;  Location: [facility];  Service: Endoscopy;;   BREAST BIOPSY     CARDIAC CATHETERIZATION  2012   performed for angina   CARDIAC CATHETERIZATION     CARDIOVERSION N/A 10/24/2020   Procedure: CARDIOVERSION;  Surgeon: Jake Bathe, MD;  Location: [facility];  Service: Cardiovascular;  Laterality: N/A;   CORONARY ANGIOPLASTY  11/2006   Cutting Balloon of stent in the RCA   ESOPHAGOGASTRODUODENOSCOPY N/A 06/03/2018   Procedure: ESOPHAGOGASTRODUODENOSCOPY (EGD);  Surgeon: Malissa Hippo, MD;  Location: [facility];  Service: Endoscopy;  Laterality: N/A;  1:40   ESOPHAGOGASTRODUODENOSCOPY N/A 12/02/2018   Procedure: ESOPHAGOGASTRODUODENOSCOPY (EGD);  Surgeon: Malissa Hippo, MD;  Location: [facility];  Service: Endoscopy;  Laterality: N/A;  255   ESOPHAGOGASTRODUODENOSCOPY (EGD) WITH PROPOFOL N/A 09/15/2022   Procedure: ESOPHAGOGASTRODUODENOSCOPY (EGD) WITH PROPOFOL;  Surgeon: Lanelle Bal, DO;  Location: [facility];  Service: Endoscopy;  Laterality: N/A;  1:00pm, asa 3   LEFT HEART CATHETERIZATION WITH CORONARY ANGIOGRAM N/A 11/27/2011   Procedure: LEFT HEART CATHETERIZATION WITH CORONARY ANGIOGRAM;  Surgeon: Marykay Lex, MD;  Location: [facility] CATH LAB;  Service: Cardiovascular;   Laterality: N/A;   PERCUTANEOUS CORONARY STENT INTERVENTION (PCI-S)  1999   Mid RCA - NIR DMS 3.0 mm x 30 mm, dRCA Vision BMS 2.5 mm x 15 mm    RIGHT HEART CATH AND CORONARY ANGIOGRAPHY N/A 11/24/2022   Procedure: RIGHT HEART CATH AND CORONARY ANGIOGRAPHY;  Surgeon: Marykay Lex, MD;  Location: [facility] INVASIVE CV LAB;  Service: Cardiovascular;  Laterality: N/A;   TEE WITHOUT CARDIOVERSION N/A 01/27/2023   Procedure: TRANSESOPHAGEAL ECHOCARDIOGRAM;  Surgeon: Kathleene Hazel, MD;  Location: [facility] OR;  Service: Open Heart Surgery;  Laterality: N/A;   TRANSTHORACIC ECHOCARDIOGRAM  10/2018   EF 60-65%. Gr 2 DD.  No RWMA.  Mild-Mod AS  (mean gradient 19 mmHg). Mild-Mod PAH.   TRANSTHORACIC ECHOCARDIOGRAM  03/01/2020   EF 70 to 75%.  No  or WMA.  Mild LVH.  GRII DD with severe LA enlargement.  Mildly elevated PAP.  Mild MR.  Moderate AS (mean gradient 17 mmHg) with dimensionless index of 0.32.   TUBAL LIGATION     Family History  Problem Relation Age of Onset   Cancer Mother    Heart disease Father    Heart disease Maternal Grandmother    Heart disease Maternal Grandfather    Heart disease Brother    Nephrolithiasis Brother        accidental death   Heart Problems Brother        CABG   Social History   Socioeconomic History   Marital status: Widowed    Spouse name: Channing Mutters   Number of children: 3   Years of education: Not on file   Highest education level: Not on file  Occupational History   Occupation: Retired Engineer, civil (consulting)  Tobacco Use   Smoking status: Former   Smokeless tobacco: Never   Tobacco comments:    Quit approximately 1993  Vaping Use   Vaping Use: Never used  Substance and Sexual Activity   Alcohol use: No   Drug use: No   Sexual activity: Not Currently    Birth control/protection: Post-menopausal  Other Topics Concern   Not on file  Social History Narrative   She is a married mother of 3, 2 daughters, 1 son who all live out of state but talks to weekly.   Step-son and  his family live next door. Sees often.    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    Quit smoking in '99, denies alcohol.   She volunteers for [employer].   Social Determinants of Health   Financial Resource Strain: Low Risk  (06/24/2023)   Overall Financial Resource Strain (CARDIA)    Difficulty of Paying Living Expenses: Not hard at all  Food Insecurity: No Food Insecurity (06/24/2023)   Hunger Vital Sign    Worried About Running Out of Food in the Last Year: Never true    Ran Out of Food in the Last Year: Never true  Transportation Needs: No Transportation Needs (06/24/2023)   PRAPARE - Administrator, Civil Service (Medical): No    Lack of Transportation (Non-Medical): No  Physical Activity: Inactive (06/24/2023)   Exercise Vital Sign    Days of Exercise per Week: 0 days    Minutes of Exercise per Session: 0 min  Stress: No Stress Concern Present (06/24/2023)   [employer] of Occupational Health - Occupational Stress Questionnaire    Feeling of Stress : Not at all  Social Connections: Moderately Isolated (06/24/2023)   Social Connection and Isolation Panel [NHANES]    Frequency of Communication with Friends and Family: More than three times a week    Frequency of Social Gatherings with Friends and Family: More than three times a week    Attends Religious Services: 1 to 4 times per year    Active Member of [employer] or Organizations: No    Attends Banker Meetings: Never    Marital Status: Widowed    Tobacco Counseling Counseling given: Not Answered Tobacco comments: Quit approximately 1993   Clinical Intake:  Pre-visit preparation completed: Yes  Pain : No/denies pain     Nutritional Risks: None Diabetes: No  How often do you need to have someone help you when you read instructions, pamphlets, or other written materials from your doctor or pharmacy?: 1 - Never  Interpreter  Needed?: No  Information entered by :: Renie Ora,  LPN   Activities of Daily Living    06/24/2023   12:05 PM 01/27/2023    8:27 AM  In your present state of health, do you have any difficulty performing the following activities:  Hearing? 0 0  Vision? 0 0  Difficulty concentrating or making decisions? 0 0  Walking or climbing stairs? 0 0  Dressing or bathing? 0 0  Doing errands, shopping? 0   Preparing Food and eating ? N   Using the Toilet? N   In the past six months, have you accidently leaked urine? Y   Do you have problems with loss of bowel control? Y   Managing your Medications? Y   Managing your Finances? Y   Housekeeping or managing your Housekeeping? Y     Patient Care Team: Sonny Masters, FNP as PCP - General (Family Medicine) Marykay Lex, MD as PCP - Cardiology (Cardiology) Michaelle Copas, MD as Referring Physician (Optometry)  Indicate any recent Medical Services you may have received from other than [facility] providers in the past year (date may be approximate).     Assessment:   This is a routine wellness examination for [place].  Hearing/Vision screen Vision Screening - Comments:: Wears rx glasses - up to date with routine eye exams with  Dr. Nedra Hai  Dietary issues and exercise activities discussed:     Goals Addressed             This Visit's Progress    DIET - INCREASE WATER INTAKE         Depression Screen    06/24/2023   12:04 PM 05/05/2023   11:59 AM 08/20/2022   12:21 PM 08/06/2022   11:42 AM 06/18/2022   11:58 AM 05/20/2022   10:15 AM 05/01/2022    2:11 PM  PHQ 2/9 Scores  PHQ - 2 Score 0 5 6 0 1 2 0  PHQ- 9 Score 0 13 13 2 2 5  0    Fall Risk    06/24/2023   12:01 PM 05/05/2023   11:59 AM 08/20/2022   12:22 PM 08/06/2022   11:43 AM 06/18/2022   12:04 PM  Fall Risk   Falls in the past year? 1 1 0 0 0  Number falls in past yr: 1 0   0  Injury with Fall? 1 0   0  Risk for fall due to : History of fall(s);Impaired balance/gait;Orthopedic patient History of fall(s)   Impaired  balance/gait;Impaired mobility  Follow up Education provided;Falls prevention discussed;Falls evaluation completed Education provided   Falls prevention discussed    MEDICARE RISK AT HOME:  Medicare Risk at Home - 06/24/23 1201     Any stairs in or around the home? Yes    If so, are there any without handrails? No    Home free of loose throw rugs in walkways, pet beds, electrical cords, etc? Yes    Adequate lighting in your home to reduce risk of falls? Yes    Life alert? No    Use of a cane, walker or w/c? No    Grab bars in the bathroom? Yes    Shower chair or bench in shower? Yes    Elevated toilet seat or a handicapped toilet? Yes             TIMED UP AND GO:  Was the test performed? No    Cognitive Function:  06/24/2023   12:06 PM 06/18/2022   12:06 PM  6CIT Screen  What Year? 0 points 0 points  What month? 0 points 0 points  What time? 0 points 0 points  Count back from 20 0 points 0 points  Months in reverse 0 points 2 points  Repeat phrase 0 points 0 points  Total Score 0 points 2 points    Immunizations Immunization History  Administered Date(s) Administered   Influenza-Unspecified 12/11/2020   Moderna Covid-19 Vaccine Bivalent Booster 6yrs & up 11/27/2020, 06/13/2021, 10/12/2021   Moderna Sars-Covid-2 Vaccination 02/15/2020, 03/14/2020   Pneumococcal Conjugate,unspecified 09/27/2008   Pneumococcal Conjugate-13 03/09/2014   Pneumococcal-Unspecified 09/24/2008   Td 10/07/2011   Tdap 10/07/2011   Zoster Recombinant(Shingrix) 07/30/2020, 12/11/2020   Zoster, Live 11/18/2011    TDAP status: Due, Education has been provided regarding the importance of this vaccine. Advised may receive this vaccine at local pharmacy or Health Dept. Aware to provide a copy of the vaccination record if obtained from local pharmacy or Health Dept. Verbalized acceptance and understanding.  Flu Vaccine status: Declined, Education has been provided regarding the  importance of this vaccine but patient still declined. Advised may receive this vaccine at local pharmacy or Health Dept. Aware to provide a copy of the vaccination record if obtained from local pharmacy or Health Dept. Verbalized acceptance and understanding.  Pneumococcal vaccine status: Up to date  Covid-19 vaccine status: Completed vaccines  Qualifies for Shingles Vaccine? Yes   Zostavax completed Yes   Shingrix Completed?: Yes  Screening Tests Health Maintenance  Topic Date Due   FOOT EXAM  Never done   OPHTHALMOLOGY EXAM  Never done   Diabetic kidney evaluation - Urine ACR  Never done   DEXA SCAN  Never done   DTaP/Tdap/Td (3 - Td or Tdap) 10/06/2021   COVID-19 Vaccine (6 - 2023-24 season) 08/22/2022   HEMOGLOBIN A1C  02/06/2023   Pneumonia Vaccine 73+ Years old (2 of 2 - PPSV23 or PCV20) 07/22/2023 (Originally 03/10/2015)   INFLUENZA VACCINE  07/23/2023   Diabetic kidney evaluation - eGFR measurement  02/05/2024   Medicare Annual Wellness (AWV)  06/23/2024   Zoster Vaccines- Shingrix  Completed   HPV VACCINES  Aged Out    Health Maintenance  Health Maintenance Due  Topic Date Due   FOOT EXAM  Never done   OPHTHALMOLOGY EXAM  Never done   Diabetic kidney evaluation - Urine ACR  Never done   DEXA SCAN  Never done   DTaP/Tdap/Td (3 - Td or Tdap) 10/06/2021   COVID-19 Vaccine (6 - 2023-24 season) 08/22/2022   HEMOGLOBIN A1C  02/06/2023    Colorectal cancer screening: No longer required.   Mammogram status: No longer required due to age .  Bone Density status: Ordered 06/24/2023. Pt provided with contact info and advised to call to schedule appt.  Lung Cancer Screening: (Low Dose CT Chest recommended if Age 22-80 years, 20 pack-year currently smoking OR have quit w/in 15years.) does not qualify.   Lung Cancer Screening Referral: n/a  Additional Screening:  Hepatitis C Screening: does not qualify;   Vision Screening: Recommended annual ophthalmology exams for  early detection of glaucoma and other disorders of the eye. Is the patient up to date with their annual eye exam?  Yes  Who is the provider or what is the name of the office in which the patient attends annual eye exams? Dr.Lee  If pt is not established with a provider, would they like to be referred  to a provider to establish care? No .   Dental Screening: Recommended annual dental exams for proper oral hygiene    Community Resource Referral / Chronic Care Management: CRR required this visit?  No   CCM required this visit?  No     Plan:     I have personally reviewed and noted the following in the patient's chart:   Medical and social history Use of alcohol, tobacco or illicit drugs  Current medications and supplements including opioid prescriptions. Patient is not currently taking opioid prescriptions. Functional ability and status Nutritional status Physical activity Advanced directives List of other physicians Hospitalizations, surgeries, and ER visits in previous 12 months Vitals Screenings to include cognitive, depression, and falls Referrals and appointments  In addition, I have reviewed and discussed with patient certain preventive protocols, quality metrics, and best practice recommendations. A written personalized care plan for preventive services as well as general preventive health recommendations were provided to patient.     Lorrene Reid, LPN   12/27/1094   After Visit Summary: (MyChart) Due to this being a telephonic visit, the after visit summary with patients personalized plan was offered to patient via MyChart   Nurse Notes: none

## 2023-06-24 NOTE — Patient Instructions (Signed)
Ms. Fecteau , Thank you for taking time to come for your Medicare Wellness Visit. I appreciate your ongoing commitment to your health goals. Please review the following plan we discussed and let me know if I can assist you in the future.   These are the goals we discussed:  Goals      DIET - INCREASE WATER INTAKE     Exercise 3x per week (30 min per time)     Continue to move as tolerated.        This is a list of the screening recommended for you and due dates:  Health Maintenance  Topic Date Due   Complete foot exam   Never done   Eye exam for diabetics  Never done   Yearly kidney health urinalysis for diabetes  Never done   DEXA scan (bone density measurement)  Never done   DTaP/Tdap/Td vaccine (3 - Td or Tdap) 10/06/2021   COVID-19 Vaccine (6 - 2023-24 season) 08/22/2022   Hemoglobin A1C  02/06/2023   Pneumonia Vaccine (2 of 2 - PPSV23 or PCV20) 07/22/2023*   Flu Shot  07/23/2023   Yearly kidney function blood test for diabetes  02/05/2024   Medicare Annual Wellness Visit  06/23/2024   Zoster (Shingles) Vaccine  Completed   HPV Vaccine  Aged Out  *Topic was postponed. The date shown is not the original due date.    Advanced directives: Advance directive discussed with you today. I have provided a copy for you to complete at home and have notarized. Once this is complete please bring a copy in to our office so we can scan it into your chart. Information on Advanced Care Planning can be found at [facility] [place] Advance Health Care Directives Advance Health Care Directives ([email or web address]    Conditions/risks identified: Aim for 30 minutes of exercise or brisk walking, 6-8 glasses of water, and 5 servings of fruits and vegetables each day.   Next appointment: Follow up in one year for your annual wellness visit    Preventive Care 65 Years and Older, Female Preventive care refers to lifestyle choices and visits with your health care provider that can promote  health and wellness. What does preventive care include? A yearly physical exam. This is also called an annual well check. Dental exams once or twice a year. Routine eye exams. Ask your health care provider how often you should have your eyes checked. Personal lifestyle choices, including: Daily care of your teeth and gums. Regular physical activity. Eating a healthy diet. Avoiding tobacco and drug use. Limiting alcohol use. Practicing safe sex. Taking low-dose aspirin every day. Taking vitamin and mineral supplements as recommended by your health care provider. What happens during an annual well check? The services and screenings done by your health care provider during your annual well check will depend on your age, overall health, lifestyle risk factors, and family history of disease. Counseling  Your health care provider may ask you questions about your: Alcohol use. Tobacco use. Drug use. Emotional well-being. Home and relationship well-being. Sexual activity. Eating habits. History of falls. Memory and ability to understand (cognition). Work and work Astronomer. Reproductive health. Screening  You may have the following tests or measurements: Height, weight, and BMI. Blood pressure. Lipid and cholesterol levels. These may be checked every 5 years, or more frequently if you are over 54 years old. Skin check. Lung cancer screening. You may have this screening every year starting at age 26 if you have  a 30-pack-year history of smoking and currently smoke or have quit within the past 15 years. Fecal occult blood test (FOBT) of the stool. You may have this test every year starting at age 49. Flexible sigmoidoscopy or colonoscopy. You may have a sigmoidoscopy every 5 years or a colonoscopy every 10 years starting at age 76. Hepatitis C blood test. Hepatitis B blood test. Sexually transmitted disease (STD) testing. Diabetes screening. This is done by checking your blood sugar  (glucose) after you have not eaten for a while (fasting). You may have this done every 1-3 years. Bone density scan. This is done to screen for osteoporosis. You may have this done starting at age 38. Mammogram. This may be done every 1-2 years. Talk to your health care provider about how often you should have regular mammograms. Talk with your health care provider about your test results, treatment options, and if necessary, the need for more tests. Vaccines  Your health care provider may recommend certain vaccines, such as: Influenza vaccine. This is recommended every year. Tetanus, diphtheria, and acellular pertussis (Tdap, Td) vaccine. You may need a Td booster every 10 years. Zoster vaccine. You may need this after age 57. Pneumococcal 13-valent conjugate (PCV13) vaccine. One dose is recommended after age 70. Pneumococcal polysaccharide (PPSV23) vaccine. One dose is recommended after age 5. Talk to your health care provider about which screenings and vaccines you need and how often you need them. This information is not intended to replace advice given to you by your health care provider. Make sure you discuss any questions you have with your health care provider. Document Released: 01/04/2016 Document Revised: 08/27/2016 Document Reviewed: 10/09/2015 Elsevier Interactive Patient Education  2017 [employer] Prevention in the Home Falls can cause injuries. They can happen to people of all ages. There are many things you can do to make your home safe and to help prevent falls. What can I do on the outside of my home? Regularly fix the edges of walkways and driveways and fix any cracks. Remove anything that might make you trip as you walk through a door, such as a raised step or threshold. Trim any bushes or trees on the path to your home. Use bright outdoor lighting. Clear any walking paths of anything that might make someone trip, such as rocks or tools. Regularly check to see  if handrails are loose or broken. Make sure that both sides of any steps have handrails. Any raised decks and porches should have guardrails on the edges. Have any leaves, snow, or ice cleared regularly. Use sand or salt on walking paths during winter. Clean up any spills in your garage right away. This includes oil or grease spills. What can I do in the bathroom? Use night lights. Install grab bars by the toilet and in the tub and shower. Do not use towel bars as grab bars. Use non-skid mats or decals in the tub or shower. If you need to sit down in the shower, use a plastic, non-slip stool. Keep the floor dry. Clean up any water that spills on the floor as soon as it happens. Remove soap buildup in the tub or shower regularly. Attach bath mats securely with double-sided non-slip rug tape. Do not have throw rugs and other things on the floor that can make you trip. What can I do in the bedroom? Use night lights. Make sure that you have a light by your bed that is easy to reach. Do not use any  sheets or blankets that are too big for your bed. They should not hang down onto the floor. Have a firm chair that has side arms. You can use this for support while you get dressed. Do not have throw rugs and other things on the floor that can make you trip. What can I do in the kitchen? Clean up any spills right away. Avoid walking on wet floors. Keep items that you use a lot in easy-to-reach places. If you need to reach something above you, use a strong step stool that has a grab bar. Keep electrical cords out of the way. Do not use floor polish or wax that makes floors slippery. If you must use wax, use non-skid floor wax. Do not have throw rugs and other things on the floor that can make you trip. What can I do with my stairs? Do not leave any items on the stairs. Make sure that there are handrails on both sides of the stairs and use them. Fix handrails that are broken or loose. Make sure that  handrails are as long as the stairways. Check any carpeting to make sure that it is firmly attached to the stairs. Fix any carpet that is loose or worn. Avoid having throw rugs at the top or bottom of the stairs. If you do have throw rugs, attach them to the floor with carpet tape. Make sure that you have a light switch at the top of the stairs and the bottom of the stairs. If you do not have them, ask someone to add them for you. What else can I do to help prevent falls? Wear shoes that: Do not have high heels. Have rubber bottoms. Are comfortable and fit you well. Are closed at the toe. Do not wear sandals. If you use a stepladder: Make sure that it is fully opened. Do not climb a closed stepladder. Make sure that both sides of the stepladder are locked into place. Ask someone to hold it for you, if possible. Clearly mark and make sure that you can see: Any grab bars or handrails. First and last steps. Where the edge of each step is. Use tools that help you move around (mobility aids) if they are needed. These include: Canes. Walkers. Scooters. Crutches. Turn on the lights when you go into a dark area. Replace any light bulbs as soon as they burn out. Set up your furniture so you have a clear path. Avoid moving your furniture around. If any of your floors are uneven, fix them. If there are any pets around you, be aware of where they are. Review your medicines with your doctor. Some medicines can make you feel dizzy. This can increase your chance of falling. Ask your doctor what other things that you can do to help prevent falls. This information is not intended to replace advice given to you by your health care provider. Make sure you discuss any questions you have with your health care provider. Document Released: 10/04/2009 Document Revised: 05/15/2016 Document Reviewed: 01/12/2015 Elsevier Interactive Patient Education  2017 [employer].

## 2023-07-10 ENCOUNTER — Other Ambulatory Visit: Payer: Self-pay | Admitting: Family Medicine

## 2023-07-29 ENCOUNTER — Ambulatory Visit (INDEPENDENT_AMBULATORY_CARE_PROVIDER_SITE_OTHER): Payer: Medicare Other | Admitting: Family Medicine

## 2023-07-29 ENCOUNTER — Ambulatory Visit (INDEPENDENT_AMBULATORY_CARE_PROVIDER_SITE_OTHER): Payer: Medicare Other

## 2023-07-29 ENCOUNTER — Encounter: Payer: Self-pay | Admitting: Family Medicine

## 2023-07-29 VITALS — BP 150/85 | HR 80 | Temp 97.8°F | Ht 65.0 in | Wt 132.0 lb

## 2023-07-29 DIAGNOSIS — E785 Hyperlipidemia, unspecified: Secondary | ICD-10-CM

## 2023-07-29 DIAGNOSIS — E119 Type 2 diabetes mellitus without complications: Secondary | ICD-10-CM | POA: Diagnosis not present

## 2023-07-29 DIAGNOSIS — I152 Hypertension secondary to endocrine disorders: Secondary | ICD-10-CM

## 2023-07-29 DIAGNOSIS — I5032 Chronic diastolic (congestive) heart failure: Secondary | ICD-10-CM

## 2023-07-29 DIAGNOSIS — D649 Anemia, unspecified: Secondary | ICD-10-CM | POA: Diagnosis not present

## 2023-07-29 DIAGNOSIS — I110 Hypertensive heart disease with heart failure: Secondary | ICD-10-CM

## 2023-07-29 DIAGNOSIS — M810 Age-related osteoporosis without current pathological fracture: Secondary | ICD-10-CM | POA: Diagnosis not present

## 2023-07-29 DIAGNOSIS — E1159 Type 2 diabetes mellitus with other circulatory complications: Secondary | ICD-10-CM

## 2023-07-29 DIAGNOSIS — K5901 Slow transit constipation: Secondary | ICD-10-CM

## 2023-07-29 DIAGNOSIS — F339 Major depressive disorder, recurrent, unspecified: Secondary | ICD-10-CM

## 2023-07-29 DIAGNOSIS — Z780 Asymptomatic menopausal state: Secondary | ICD-10-CM | POA: Diagnosis not present

## 2023-07-29 DIAGNOSIS — R1084 Generalized abdominal pain: Secondary | ICD-10-CM

## 2023-07-29 DIAGNOSIS — E1169 Type 2 diabetes mellitus with other specified complication: Secondary | ICD-10-CM

## 2023-07-29 DIAGNOSIS — Z23 Encounter for immunization: Secondary | ICD-10-CM

## 2023-07-29 DIAGNOSIS — K5900 Constipation, unspecified: Secondary | ICD-10-CM | POA: Diagnosis not present

## 2023-07-29 LAB — BAYER DCA HB A1C WAIVED: HB A1C (BAYER DCA - WAIVED): 5.7 % — ABNORMAL HIGH (ref 4.8–5.6)

## 2023-07-29 MED ORDER — CARVEDILOL 25 MG PO TABS
25.0000 mg | ORAL_TABLET | Freq: Two times a day (BID) | ORAL | 1 refills | Status: DC
Start: 2023-07-29 — End: 2024-03-24

## 2023-07-29 MED ORDER — FUROSEMIDE 40 MG PO TABS
40.0000 mg | ORAL_TABLET | Freq: Every day | ORAL | 3 refills | Status: DC | PRN
Start: 2023-07-29 — End: 2023-11-05

## 2023-07-29 MED ORDER — SERTRALINE HCL 25 MG PO TABS: 25.0000 mg | ORAL_TABLET | Freq: Every day | ORAL | 1 refills | Status: DC

## 2023-07-29 NOTE — Patient Instructions (Signed)

## 2023-07-29 NOTE — Progress Notes (Signed)
Subjective:  Patient ID: [name], female    DOB: 11-28-1936, 87 y.o.   MRN: [id number]  Patient Care Team: Sonny Masters, FNP as PCP - General (Family Medicine) Marykay Lex, MD as PCP - Cardiology (Cardiology) Michaelle Copas, MD as Referring Physician (Optometry)   Chief Complaint:  Medical Management of Chronic Issues and Annual Exam (Abd pain and bloating that has been going 6 months that has been on and off. )   HPI: [name] is a 87 y.o. female presenting on 07/29/2023 for Medical Management of Chronic Issues and Annual Exam (Abd pain and bloating that has been going 6 months that has been on and off. )   Pt has not been to office in several months. She had a TAVR 01/2023 and subsequently developed a large abdominal hematoma and had to be readmitted. This has since subsided but she reports continued right sided abdominal pain and bloating. States that this pain is worse with certain movements and positions. States she has had irregular bowel habits also. Was seen by GI and placed on imodium due to recurrent diarrhea and fecal incontinence. States now she only passes "rabbit pellets" when she has a bowel movement. States this has been ongoing for several weeks.   1. Diet-controlled diabetes mellitus (HCC) Does do well with diet and stays active. She had her eye exam with Dr. Conley Rolls, those records have been requested. She denies polyuria, polyphagia, or polydipsia.   2. Hyperlipidemia associated with type 2 diabetes mellitus (HCC) Compliant with medications - Yes Current medications - atorvastatin  Side effects from medications - No Diet - general Exercise - active daily  3. Hypertension associated with diabetes (HCC) Complaint with meds - Yes Current Medications - lotensin, coreg, lasix  Checking BP at home ranging 110-150/60-80 Exercising Regularly - no but active daily Watching Salt intake - Yes Pertinent ROS:  Headache - No Fatigue - No Visual  Disturbances - No Chest pain - No Dyspnea - No Palpitations - No LE edema - Yes They report good compliance with medications and can restate their regimen by memory. No medication side effects.  BP Readings from Last 3 Encounters:  07/29/23 (!) 150/85  05/26/23 (!) 198/79  05/05/23 (!) 95/48    4. Hypertensive heart disease with chronic diastolic congestive heart failure [facility]) She is followed by cardiology on a regular basis. She does have some slight lower extremity swelling at times, takes lasix with resolution of swelling. No chest pain, palpitations, shortness of breath, orthopnea, or PND.   5. Depression, recurrent (HCC) Doing well on her sertraline but feels this is making her tired. She has been taking every morning, has not tried dosing at night. Denies other adverse side effects. No SI or HI.     07/29/2023   11:07 AM 06/24/2023   12:04 PM 05/05/2023   11:59 AM 08/20/2022   12:21 PM 08/06/2022   11:42 AM  Depression screen PHQ 2/9  Decreased Interest 1 0 3 3 0  Down, Depressed, Hopeless 1 0 2 3 0  PHQ - 2 Score 2 0 5 6 0  Altered sleeping 2 0 2 3 2   Tired, decreased energy 3 0 3 3 0  Change in appetite 1 0 2 1 0  Feeling bad or failure about yourself  0 0 0 0 0  Trouble concentrating 0 0 1 0 0  Moving slowly or fidgety/restless 0 0 0 0 0  Suicidal thoughts 0  0 0 0 0  PHQ-9 Score 8 0 13 13 2   Difficult doing work/chores Somewhat difficult Not difficult at all Somewhat difficult  Somewhat difficult      07/29/2023   11:07 AM 05/05/2023   12:00 PM 08/20/2022   12:21 PM 08/06/2022   11:42 AM  GAD 7 : Generalized Anxiety Score  Nervous, Anxious, on Edge 3 0 1 3  Control/stop worrying 3 0 3 3  Worry too much - different things 3 0 0 3  Trouble relaxing 0 0 3 3  Restless 0 0 0 0  Easily annoyed or irritable 0 0 0 2  Afraid - awful might happen 0 0 0 0  Total GAD 7 Score 9 0 7 14  Anxiety Difficulty Somewhat difficult Not difficult at all Very difficult Very difficult           Relevant past medical, surgical, family, and social history reviewed and updated as indicated.  Allergies and medications reviewed and updated. Data reviewed: Chart in Epic.   Past Medical History:  Diagnosis Date   A-fib [facility])    Anxiety    CAD S/P percutaneous coronary angioplasty 1990s, 11/28/2011    a) midRCA PCI 1999 (NIR BMS 3.0 mm x 32 mm); distal  RCA BMS Sept 2012 (Vision BMS 2.5 mm x 15 mm); 12/02: RCA ISR --> Cutting PTCA    Coronary stent restenosis due to progression of disease 11/30/2011   Dyslipidemia, goal LDL below 70 11/28/2011   Essential hypertension    On ACE inhibitor and ARB? As well as atenolol and hydralazine   Gastric ulcer 11/2018   GERD (gastroesophageal reflux disease)    History of blood transfusion    without reaction   Moderate aortic stenosis by prior echocardiogram 08/2011; 08/2013   Echo 11/09/18: EF 60-65%. Gr 2 DD.  No RWMA.  Mild-Mod AS (mean gradient 19 mmHg). Mild-Mod PAH. 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 Clifton James and Dr. Laneta Simmers   Type 2 diabetes mellitus with complication, without long-term current use of insulin (HCC)    no longer taking diabetic medication - diet controlled    Past Surgical History:  Procedure Laterality Date   ABDOMINAL HYSTERECTOMY     BIOPSY  06/03/2018   Procedure: BIOPSY;  Surgeon: Malissa Hippo, MD;  [facility]: [facility];  Service: Endoscopy;;  gastric    BIOPSY  09/15/2022   Procedure: BIOPSY;  Surgeon: Lanelle Bal, DO;  Location: [facility];  Service: Endoscopy;;   BREAST BIOPSY     CARDIAC  CATHETERIZATION  2012   performed for angina   CARDIAC CATHETERIZATION     CARDIOVERSION N/A 10/24/2020   Procedure: CARDIOVERSION;  Surgeon: Jake Bathe, MD;  Location: [facility];  Service: Cardiovascular;  Laterality: N/A;   CORONARY ANGIOPLASTY  11/2006   Cutting Balloon of stent in the RCA   ESOPHAGOGASTRODUODENOSCOPY N/A 06/03/2018   Procedure: ESOPHAGOGASTRODUODENOSCOPY (EGD);  Surgeon: Malissa Hippo, MD;  Location: [facility];  Service: Endoscopy;  Laterality: N/A;  1:40   ESOPHAGOGASTRODUODENOSCOPY N/A 12/02/2018   Procedure: ESOPHAGOGASTRODUODENOSCOPY (  EGD);  Surgeon: Malissa Hippo, MD;  [facility]: [facility];  Service: Endoscopy;  Laterality: N/A;  255   ESOPHAGOGASTRODUODENOSCOPY (EGD) WITH PROPOFOL N/A 09/15/2022   Procedure: ESOPHAGOGASTRODUODENOSCOPY (EGD) WITH PROPOFOL;  Surgeon: Lanelle Bal, DO;  Location: [facility];  Service: Endoscopy;  Laterality: N/A;  1:00pm, asa 3   LEFT HEART CATHETERIZATION WITH CORONARY ANGIOGRAM N/A 11/27/2011   Procedure: LEFT HEART CATHETERIZATION WITH CORONARY ANGIOGRAM;  Surgeon: Marykay Lex, MD;  Location: [facility] CATH LAB;  Service: Cardiovascular;  Laterality: N/A;   PERCUTANEOUS CORONARY STENT INTERVENTION (PCI-S)  1999   Mid RCA - NIR DMS 3.0 mm x 30 mm, dRCA Vision BMS 2.5 mm x 15 mm    RIGHT HEART CATH AND CORONARY ANGIOGRAPHY N/A 11/24/2022   Procedure: RIGHT HEART CATH AND CORONARY ANGIOGRAPHY;  Surgeon: Marykay Lex, MD;  Location: [facility] INVASIVE CV LAB;  Service: Cardiovascular;  Laterality: N/A;   TEE WITHOUT CARDIOVERSION N/A 01/27/2023   Procedure: TRANSESOPHAGEAL ECHOCARDIOGRAM;  Surgeon: Kathleene Hazel, MD;  Location: [facility] OR;  Service: Open Heart Surgery;  Laterality: N/A;   TRANSTHORACIC ECHOCARDIOGRAM  10/2018   EF 60-65%. Gr 2 DD.  No RWMA.  Mild-Mod AS  (mean gradient 19 mmHg). Mild-Mod PAH.   TRANSTHORACIC ECHOCARDIOGRAM  03/01/2020   EF 70 to 75%.  No or WMA.  Mild LVH.  GRII DD with severe LA  enlargement.  Mildly elevated PAP.  Mild MR.  Moderate AS (mean gradient 17 mmHg) with dimensionless index of 0.32.   TUBAL LIGATION      Social History   Socioeconomic History   Marital status: Widowed    Spouse name: Channing Mutters   Number of children: 3   Years of education: Not on file   Highest education level: Not on file  Occupational History   Occupation: Retired Engineer, civil (consulting)  Tobacco Use   Smoking status: Former   Smokeless tobacco: Never   Tobacco comments:    Quit approximately 1993  Vaping Use   Vaping status: Never Used  Substance and Sexual Activity   Alcohol use: No   Drug use: No   Sexual activity: Not Currently    Birth control/protection: Post-menopausal  Other Topics Concern   Not on file  Social History Narrative   She is a married mother of 3, 2 daughters, 1 son who all live out of state but talks to weekly.   Step-son and his family live next door. Sees often.    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    Quit smoking in '99, denies alcohol.   She volunteers for [employer].   Social Determinants of Health   Financial Resource Strain: Low Risk  (06/24/2023)   Overall Financial Resource Strain (CARDIA)    Difficulty of Paying Living Expenses: Not hard at all  Food Insecurity: No Food Insecurity (06/24/2023)   Hunger Vital Sign    Worried About Running Out of Food in the Last Year: Never true    Ran Out of Food in the Last Year: Never true  Transportation Needs: No Transportation Needs (06/24/2023)   PRAPARE - Administrator, Civil Service (Medical): No    Lack of Transportation (Non-Medical): No  Physical Activity: Inactive (06/24/2023)   Exercise Vital Sign    Days of Exercise per Week: 0 days    Minutes of Exercise per Session: 0 min  Stress: No Stress Concern Present (06/24/2023)   [employer] of Occupational Health - Occupational  Stress Questionnaire    Feeling of Stress : Not at all  Social Connections: Moderately Isolated (06/24/2023)    Social Connection and Isolation Panel [NHANES]    Frequency of Communication with Friends and Family: More than three times a week    Frequency of Social Gatherings with Friends and Family: More than three times a week    Attends Religious Services: 1 to 4 times per year    Active Member of [employer] or Organizations: No    Attends Banker Meetings: Never    Marital Status: Widowed  Intimate Partner Violence: Not At Risk (06/24/2023)   Humiliation, Afraid, Rape, and Kick questionnaire    Fear of Current or Ex-Partner: No    Emotionally Abused: No    Physically Abused: No    Sexually Abused: No    Outpatient Encounter Medications as of 07/29/2023  Medication Sig   apixaban (ELIQUIS) 2.5 MG TABS tablet Take 1 tablet (2.5 mg total) by mouth 2 (two) times daily.   atorvastatin (LIPITOR) 20 MG tablet TAKE ONE (1) TABLET EACH DAY   benazepril (LOTENSIN) 40 MG tablet Take 40 mg by mouth daily.   diphenhydramine-acetaminophen (TYLENOL PM) 25-500 MG TABS tablet Take 2 tablets by mouth at bedtime.   hydrALAZINE (APRESOLINE) 100 MG tablet Take 1 tablet (100 mg total) by mouth 2 (two) times daily. (NEEDS TO BE SEEN BEFORE NEXT REFILL)   loperamide (IMODIUM A-D) 2 MG tablet Take 2 mg by mouth 4 (four) times daily as needed for diarrhea or loose stools.   NITROSTAT 0.4 MG SL tablet DISSOLVE 1 TAB UNDER TOUNGE FOR CHEST PAIN. MAY REPEAT EVERY 5 MINUTES FOR 3 DOSES. IF NO RELIEF CALL 911 OR GO TO ER   pantoprazole (PROTONIX) 40 MG tablet TAKE 1 TABLET BY MOUTH TWICE DAILY BEFORE MEALS   [DISCONTINUED] carvedilol (COREG) 25 MG tablet Take 1 tablet (25 mg total) by mouth 2 (two) times daily. (NEEDS TO BE SEEN BEFORE NEXT REFILL)   [DISCONTINUED] furosemide (LASIX) 40 MG tablet Take 1 tablet (40 mg total) by mouth daily as needed for fluid.   [DISCONTINUED] sertraline (ZOLOFT) 25 MG tablet Take 25 mg by mouth daily.   carvedilol (COREG) 25 MG tablet Take 1 tablet (25 mg total) by mouth 2 (two) times  daily.   furosemide (LASIX) 40 MG tablet Take 1 tablet (40 mg total) by mouth daily as needed for fluid.   sertraline (ZOLOFT) 25 MG tablet Take 1 tablet (25 mg total) by mouth at bedtime.   No facility-administered encounter medications on file as of 07/29/2023.    Allergies  Allergen Reactions   Valium Nausea And Vomiting    Review of Systems  Constitutional:  Positive for activity change, appetite change and fatigue. Negative for chills, diaphoresis, fever and unexpected weight change.  HENT: Negative.    Eyes: Negative.  Negative for photophobia and visual disturbance.  Respiratory:  Negative for cough, chest tightness and shortness of breath.   Cardiovascular:  Positive for leg swelling. Negative for chest pain and palpitations.  Gastrointestinal:  Positive for abdominal distention, abdominal pain and constipation. Negative for anal bleeding, blood in stool, diarrhea, nausea, rectal pain and vomiting.  Endocrine: Negative.  Negative for cold intolerance, heat intolerance, polydipsia, polyphagia and polyuria.  Genitourinary:  Negative for decreased urine volume, difficulty urinating, dysuria, frequency and urgency.  Musculoskeletal:  Negative for arthralgias and myalgias.  Skin: Negative.   Allergic/Immunologic: Negative.   Neurological:  Negative for dizziness, tremors, seizures, syncope, facial  asymmetry, speech difficulty, weakness, light-headedness, numbness and headaches.  Hematological: Negative.   Psychiatric/Behavioral:  Positive for sleep disturbance. Negative for agitation, behavioral problems, confusion, decreased concentration, dysphoric mood, hallucinations, self-injury and suicidal ideas. The patient is nervous/anxious. The patient is not hyperactive.   All other systems reviewed and are negative.       Objective:  BP (!) 150/85   Pulse 80   Temp 97.8 F (36.6 C) (Temporal)   Ht 5\' 5"  (1.651 m)   Wt 132 lb (59.9 kg)   SpO2 98%   BMI 21.97 kg/m    Wt Readings  from Last 3 Encounters:  07/29/23 132 lb (59.9 kg)  06/24/23 125 lb (56.7 kg)  05/26/23 125 lb 3.2 oz (56.8 kg)    Physical Exam Vitals and nursing note reviewed.  Constitutional:      General: She is not in acute distress.    Appearance: Normal appearance. She is well-developed and well-groomed. She is not ill-appearing, toxic-appearing or diaphoretic.  HENT:     Head: Normocephalic and atraumatic.     Jaw: There is normal jaw occlusion.     Right Ear: Hearing normal.     Left Ear: Hearing normal.     Nose: Nose normal.     Mouth/Throat:     Lips: Pink.     Mouth: Mucous membranes are moist.     Pharynx: Oropharynx is clear. Uvula midline.  Eyes:     General: Lids are normal.     Extraocular Movements: Extraocular movements intact.     Conjunctiva/sclera: Conjunctivae normal.     Pupils: Pupils are equal, round, and reactive to light.  Neck:     Thyroid: No thyroid mass, thyromegaly or thyroid tenderness.     Vascular: No carotid bruit or JVD.     Trachea: Trachea and phonation normal.  Cardiovascular:     Rate and Rhythm: Normal rate. Rhythm irregularly irregular.     Chest Wall: PMI is not displaced.     Pulses: Normal pulses.     Heart sounds: Murmur heard.     Systolic murmur is present with a grade of 1/6.     No friction rub. No gallop.  Pulmonary:     Effort: Pulmonary effort is normal. No respiratory distress.     Breath sounds: Normal breath sounds. No wheezing.  Abdominal:     General: Bowel sounds are normal. There is no distension or abdominal bruit.     Palpations: Abdomen is soft. There is no hepatomegaly or splenomegaly.     Tenderness: There is abdominal tenderness in the right upper quadrant, right lower quadrant, epigastric area and periumbilical area. There is no right CVA tenderness, left CVA tenderness, guarding or rebound. Negative signs include Murphy's sign, Rovsing's sign, McBurney's sign, psoas sign and obturator sign.     Hernia: No hernia is  present.  Musculoskeletal:        General: Normal range of motion.     Cervical back: Normal range of motion and neck supple.     Right lower leg: 1+ Edema present.     Left lower leg: 1+ Edema present.  Lymphadenopathy:     Cervical: No cervical adenopathy.  Skin:    General: Skin is warm and dry.     Capillary Refill: Capillary refill takes less than 2 seconds.     Coloration: Skin is not cyanotic, jaundiced or pale.     Findings: No rash.  Neurological:     General: No focal  deficit present.     Mental Status: She is alert and oriented to person, place, and time.     Sensory: Sensation is intact.     Motor: Motor function is intact.     Coordination: Coordination is intact.     Gait: Gait is intact.     Deep Tendon Reflexes: Reflexes are normal and symmetric.  Psychiatric:        Attention and Perception: Attention and perception normal.        Mood and Affect: Mood and affect normal.        Speech: Speech normal.        Behavior: Behavior normal. Behavior is cooperative.        Thought Content: Thought content normal.        Cognition and Memory: Cognition and memory normal.        Judgment: Judgment normal.     Results for orders placed or performed in visit on 07/29/23  Bayer DCA Hb A1c Waived  Result Value Ref Range   HB A1C (BAYER DCA - WAIVED) 5.7 (H) 4.8 - 5.6 %     X-Ray: KUB: significant stool burden. No acute findings. Preliminary x-ray reading by Kari Baars, FNP-C, [facility].   Pertinent labs & imaging results that were available during my care of the patient were reviewed by me and considered in my medical decision making.  Assessment & Plan:  Ymani was seen today for medical management of chronic issues and annual exam.  Diagnoses and all orders for this visit:  Diet-controlled diabetes mellitus (HCC) A1C 5.7, continue with diet and exercise. Vaccinations updated today. Other labs pending.  -     Bayer DCA Hb A1c Waived -     Microalbumin /  creatinine urine ratio -     Pneumococcal conjugate vaccine 20-valent (Prevnar 20)  Hyperlipidemia associated with type 2 diabetes mellitus (HCC) Diet encouraged - increase intake of fresh fruits and vegetables, increase intake of lean proteins. Bake, broil, or grill foods. Avoid fried, greasy, and fatty foods. Avoid fast foods. Increase intake of fiber-rich whole grains. Exercise encouraged - at least 150 minutes per week and advance as tolerated.  Goal BMI < 25. Continue medications as prescribed. Follow up in 3-6 months as discussed.  -     Lipid panel -     CMP14+EGFR  Hypertension associated with diabetes (HCC) BP well controlled. Changes were not made in regimen today. Goal BP is 130/80. Pt aware to report any persistent high or low readings. DASH diet and exercise encouraged. Exercise at least 150 minutes per week and increase as tolerated. Goal BMI > 25. Stress management encouraged. Avoid nicotine and tobacco product use. Avoid excessive alcohol and NSAID's. Avoid more than 2000 mg of sodium daily. Medications as prescribed. Follow up as scheduled.  -     CBC with Differential/Platelet -     CMP14+EGFR -     carvedilol (COREG) 25 MG tablet; Take 1 tablet (25 mg total) by mouth 2 (two) times daily.  Hypertensive heart disease with chronic diastolic congestive heart failure (HCC) Well compensated. Labs pending. Continue to follow up with cardiology and continue current medications.  -     Lipid panel -     CBC with Differential/Platelet -     CMP14+EGFR -     furosemide (LASIX) 40 MG tablet; Take 1 tablet (40 mg total) by mouth daily as needed for fluid.  Depression, recurrent (HCC) Will change dosing to bedtime to see if  this helps to mitigate fatigue.  -     sertraline (ZOLOFT) 25 MG tablet; Take 1 tablet (25 mg total) by mouth at bedtime.  Generalized abdominal pain Slow transit constipation KUB with significant stool burden, will notify pt if radiology reading differs. Miralax  use discussed in detail to achieve regular bowel movements daily. Report new, worsening, or persistent symptoms.  -     DG Abd 1 View  Need for vaccination Vaccinations updated today.  -     Td vaccine greater than or equal to 7yo preservative free IM     Continue all other maintenance medications.  Follow up plan: Return in about 3 months (around 10/29/2023) for DM.   Continue healthy lifestyle choices, including diet (rich in fruits, vegetables, and lean proteins, and low in salt and simple carbohydrates) and exercise (at least 30 minutes of moderate physical activity daily).  Educational handout given for DASH diet, HTN  The above assessment and management plan was discussed with the patient. The patient verbalized understanding of and has agreed to the management plan. Patient is aware to call the clinic if they develop any new symptoms or if symptoms persist or worsen. Patient is aware when to return to the clinic for a follow-up visit. Patient educated on when it is appropriate to go to the emergency department.   Kari Baars, FNP-C [facility] [phone]

## 2023-08-10 ENCOUNTER — Other Ambulatory Visit: Payer: Self-pay | Admitting: Family Medicine

## 2023-08-10 NOTE — Addendum Note (Signed)
Addended by: Angela Adam on: 08/10/2023 08:43 AM   Modules accepted: Orders

## 2023-08-13 DIAGNOSIS — H903 Sensorineural hearing loss, bilateral: Secondary | ICD-10-CM | POA: Diagnosis not present

## 2023-08-17 ENCOUNTER — Other Ambulatory Visit: Payer: Medicare Other

## 2023-08-18 ENCOUNTER — Other Ambulatory Visit: Payer: Medicare Other

## 2023-08-18 DIAGNOSIS — D649 Anemia, unspecified: Secondary | ICD-10-CM

## 2023-08-19 ENCOUNTER — Encounter (HOSPITAL_COMMUNITY): Payer: Self-pay

## 2023-08-19 ENCOUNTER — Emergency Department (HOSPITAL_COMMUNITY): Payer: Medicare Other

## 2023-08-19 ENCOUNTER — Emergency Department (HOSPITAL_COMMUNITY)
Admission: EM | Admit: 2023-08-19 | Discharge: 2023-08-19 | Disposition: A | Discharge status: Home / Self Care | Payer: Medicare Other | Attending: Emergency Medicine | Admitting: Emergency Medicine

## 2023-08-19 ENCOUNTER — Other Ambulatory Visit: Payer: Self-pay

## 2023-08-19 DIAGNOSIS — D649 Anemia, unspecified: Secondary | ICD-10-CM | POA: Diagnosis not present

## 2023-08-19 DIAGNOSIS — K449 Diaphragmatic hernia without obstruction or gangrene: Secondary | ICD-10-CM | POA: Diagnosis not present

## 2023-08-19 DIAGNOSIS — K7469 Other cirrhosis of liver: Secondary | ICD-10-CM | POA: Diagnosis not present

## 2023-08-19 DIAGNOSIS — Z7901 Long term (current) use of anticoagulants: Secondary | ICD-10-CM | POA: Diagnosis not present

## 2023-08-19 DIAGNOSIS — R1031 Right lower quadrant pain: Secondary | ICD-10-CM | POA: Insufficient documentation

## 2023-08-19 DIAGNOSIS — K766 Portal hypertension: Secondary | ICD-10-CM | POA: Diagnosis not present

## 2023-08-19 LAB — COMPREHENSIVE METABOLIC PANEL
ALT: 12 U/L (ref 0–44)
AST: 19 U/L (ref 15–41)
Albumin: 4.1 g/dL (ref 3.5–5.0)
Alkaline Phosphatase: 86 U/L (ref 38–126)
Anion gap: 12 (ref 5–15)
BUN: 21 mg/dL (ref 8–23)
CO2: 26 mmol/L (ref 22–32)
Calcium: 9.5 mg/dL (ref 8.9–10.3)
Chloride: 101 mmol/L (ref 98–111)
Creatinine, Ser: 0.71 mg/dL (ref 0.44–1.00)
GFR, Estimated: >60 mL/min (ref >60)
Glucose, Bld: 120 mg/dL — ABNORMAL HIGH (ref 70–99)
Potassium: 4.4 mmol/L (ref 3.5–5.1)
Sodium: 139 mmol/L (ref 135–145)
Total Bilirubin: 0.5 mg/dL (ref 0.3–1.2)
Total Protein: 7.5 g/dL (ref 6.5–8.1)

## 2023-08-19 LAB — CBC WITH DIFFERENTIAL/PLATELET
Basophils Absolute: 0 10*3/uL (ref 0.0–0.2)
Basos: 1 %
EOS (ABSOLUTE): 0.7 10*3/uL — ABNORMAL HIGH (ref 0.0–0.4)
Eos: 13 %
Hematocrit: 27.1 % — ABNORMAL LOW (ref 34.0–46.6)
Hemoglobin: 8.6 g/dL — CL (ref 11.1–15.9)
Immature Grans (Abs): 0 10*3/uL (ref 0.0–0.1)
Immature Granulocytes: 0 %
Lymphocytes Absolute: 0.9 10*3/uL (ref 0.7–3.1)
Lymphs: 16 %
MCH: 29.3 pg (ref 26.6–33.0)
MCHC: 31.7 g/dL (ref 31.5–35.7)
MCV: 92 fL (ref 79–97)
Monocytes Absolute: 0.5 10*3/uL (ref 0.1–0.9)
Monocytes: 8 %
Neutrophils Absolute: 3.3 10*3/uL (ref 1.4–7.0)
Neutrophils: 62 %
Platelets: 197 10*3/uL (ref 150–450)
RBC: 2.94 x10E6/uL — ABNORMAL LOW (ref 3.77–5.28)
RDW: 13.3 % (ref 11.7–15.4)
WBC: 5.4 10*3/uL (ref 3.4–10.8)

## 2023-08-19 LAB — CBC
HCT: 29.6 % — ABNORMAL LOW (ref 36.0–46.0)
Hemoglobin: 9.2 g/dL — ABNORMAL LOW (ref 12.0–15.0)
MCH: 29.2 pg (ref 26.0–34.0)
MCHC: 31.1 g/dL (ref 30.0–36.0)
MCV: 94 fL (ref 80.0–100.0)
Platelets: 215 10*3/uL (ref 150–400)
RBC: 3.15 MIL/uL — ABNORMAL LOW (ref 3.87–5.11)
RDW: 15 % (ref 11.5–15.5)
WBC: 5.8 10*3/uL (ref 4.0–10.5)
nRBC: 0 % (ref 0.0–0.2)

## 2023-08-19 LAB — TYPE AND SCREEN
ABO/RH(D): O POS
Antibody Screen: NEGATIVE

## 2023-08-19 MED ORDER — IRON (FERROUS SULFATE) 325 (65 FE) MG PO TABS: 325.0000 mg | ORAL_TABLET | Freq: Every day | ORAL | 3 refills | Status: DC

## 2023-08-19 MED ORDER — IOHEXOL 300 MG/ML  SOLN
100.0000 mL | Freq: Once | INTRAMUSCULAR | Status: AC | PRN
  Administered 2023-08-19: 100 mL via INTRAVENOUS

## 2023-08-19 NOTE — ED Provider Notes (Signed)
[place] EMERGENCY DEPARTMENT AT [facility] Provider Note   CSN: [id number] Arrival date & time: 08/19/23  1642     History  Chief Complaint  Patient presents with   Abnormal Lab    [name] is a 87 y.o. female.   Abnormal Lab Patient send for anemia.  Hemoglobin 8.6 yesterday.  Has had some dark stools but that has been for a while.  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  Did have anemia with that.  Hemoglobin had been 9.7  3 weeks ago.  2 months ago it was 10.  Does not feel more lightheaded or dizzy.  Is on Eliquis for atrial fibrillation.     Home Medications Prior to Admission medications   Medication Sig Start Date End Date Taking? Authorizing Provider  apixaban (ELIQUIS) 2.5 MG TABS tablet Take 1 tablet (2.5 mg total) by mouth 2 (two) times daily. 03/24/23 03/18/24  Georgie Chard D, NP  atorvastatin (LIPITOR) 20 MG tablet TAKE ONE (1) TABLET EACH DAY 12/08/22   Sonny Masters, FNP  benazepril (LOTENSIN) 40 MG tablet Take 40 mg by mouth daily. 03/05/23   [provider]  carvedilol (COREG) 25 MG tablet Take 1 tablet (25 mg total) by mouth 2 (two) times daily. 07/29/23   Sonny Masters, FNP  diphenhydramine-acetaminophen (TYLENOL PM) 25-500 MG TABS tablet Take 2 tablets by mouth at bedtime.    [provider]  furosemide (LASIX) 40 MG tablet Take 1 tablet (40 mg total) by mouth daily as needed for fluid. 07/29/23   Sonny Masters, FNP  hydrALAZINE (APRESOLINE) 100 MG tablet TAKE ONE (1) TABLET BY MOUTH TWO (2) TIMES DAILY 08/10/23   Rakes, Doralee Albino, FNP  Iron, Ferrous Sulfate, 325 (65 Fe) MG TABS Take 325 mg by mouth daily. 08/19/23   Sonny Masters, FNP  loperamide (IMODIUM A-D) 2 MG tablet Take 2 mg by mouth 4 (four) times daily as needed for diarrhea or loose stools.    [provider]  NITROSTAT 0.4 MG SL tablet DISSOLVE 1 TAB UNDER TOUNGE FOR CHEST PAIN. MAY REPEAT EVERY 5 MINUTES FOR 3 DOSES. IF NO RELIEF  CALL 911 OR GO TO ER 11/24/16   Marykay Lex, MD  pantoprazole (PROTONIX) 40 MG tablet TAKE 1 TABLET BY MOUTH TWICE DAILY BEFORE MEALS 06/09/23   Tiffany Kocher, PA-C  sertraline (ZOLOFT) 25 MG tablet Take 1 tablet (25 mg total) by mouth at bedtime. 07/29/23   Sonny Masters, FNP      Allergies    Valium    Review of Systems   Review of Systems  Physical Exam Updated Vital Signs BP (!) 195/102 (BP Location: Left Arm)   Pulse 92   Temp 97.9 F (36.6 C) (Oral)   Resp 16   Ht 5\' 5"  (1.651 m)   Wt 56.7 kg   SpO2 96%   BMI 20.80 kg/m  Physical Exam Vitals and nursing note reviewed.  HENT:     Head: Normocephalic.  Cardiovascular:     Rate and Rhythm: Normal rate.  Abdominal:     Tenderness: There is abdominal tenderness.     Comments: Right lower quadrant tenderness without rebound or guarding.  No hernia palpated.  Genitourinary:    Rectum: Guaiac result negative.  Skin:    General: Skin is warm.  Neurological:     Mental Status: She is alert and oriented to person, place, and time.  ED Results / Procedures / Treatments   Labs (all labs ordered are listed, but only abnormal results are displayed) Labs Reviewed  COMPREHENSIVE METABOLIC PANEL - Abnormal; Notable for the following components:      Result Value   Glucose, Bld 120 (*)    All other components within normal limits  CBC - Abnormal; Notable for the following components:   RBC 3.15 (*)    Hemoglobin 9.2 (*)    HCT 29.6 (*)    All other components within normal limits  POC OCCULT BLOOD, ED - Normal  TYPE AND SCREEN    EKG None  Radiology CT ABDOMEN PELVIS W CONTRAST  Result Date: 08/19/2023 CLINICAL DATA:  Retroperitoneal bleed suspected. Low hemoglobin with weakness and dark stools. EXAM: CT ABDOMEN AND PELVIS WITH CONTRAST TECHNIQUE: Multidetector CT imaging of the abdomen and pelvis was performed using the standard protocol following bolus administration of intravenous contrast. RADIATION DOSE  REDUCTION: This exam was performed according to the departmental dose-optimization program which includes automated exposure control, adjustment of the mA and/or kV according to patient size and/or use of iterative reconstruction technique. CONTRAST:  OMNIPAQUE IOHEXOL 300 MG/ML  SOLN COMPARISON:  02/03/2023. FINDINGS: Lower chest: The heart is enlarged and coronary artery calcifications are noted. Atelectasis is noted at the lung bases with trace bilateral pleural effusions. Hepatobiliary: The liver small with a nodular contour suggesting underlying cirrhosis. The liver enhances heterogenously and superimposed fatty infiltration is noted. Gallbladder is without stones. No biliary ductal dilatation. Pancreas: Unremarkable. No pancreatic ductal dilatation or surrounding inflammatory changes. Spleen: Normal in size without focal abnormality. Adrenals/Urinary Tract: No adrenal nodule or mass. The kidneys enhance symmetrically. No renal calculus or hydronephrosis. The bladder is unremarkable. Stomach/Bowel: There is a moderate hiatal hernia. Stomach is within normal limits. Appendix appears normal. No evidence of bowel wall thickening, distention, or inflammatory changes. No free air or pneumatosis. Vascular/Lymphatic: Aortic atherosclerosis. A few gastric varices are noted. No enlarged abdominal or pelvic lymph nodes. Reproductive: Status post hysterectomy. No adnexal masses. Other: No evidence of retroperitoneal hematoma large ascites is noted in all 4 quadrants. There is a fat containing inguinal hernia on the left. Musculoskeletal: Degenerative changes are present in the thoracolumbar spine. IMPRESSION: 1. No evidence of retroperitoneal hemorrhage. 2. Morphologic changes of cirrhosis and portal hypertension. 3. Large ascites. 4. Trace bilateral pleural effusions with atelectasis. 5. Moderate hiatal hernia. 6. Cardiomegaly with coronary artery calcifications. 7. Aortic atherosclerosis. Electronically Signed    By: Thornell Sartorius M.D.   On: 08/19/2023 22:38    Procedures Procedures    Medications Ordered in ED Medications  iohexol (OMNIPAQUE) 300 MG/ML solution 100 mL (100 mLs Intravenous Contrast Given 08/19/23 2212)    ED Course/ Medical Decision Making/ A&P                                 Medical Decision Making Amount and/or Complexity of Data Reviewed Labs: ordered. Radiology: ordered. ECG/medicine tests: ordered.  Risk Prescription drug management.   Patient with anemia.  Sent in to rule out GI bleed or worsening anemia.  Reviewed notes and has had anemia recently.  Hemoglobin mildly increased today compared to yesterday.  No GI bleed.  Does have worsening ascites and does have potential cirrhosis.  Follow-up with GI.  Do not think we need admission to the hospital.  Will discharge.  I reviewed discharge notes  Final Clinical Impression(s) / ED Diagnoses Final diagnoses:  Anemia, unspecified type    Rx / DC Orders ED Discharge Orders     None         Benjiman Core, MD 08/19/23 2249

## 2023-08-19 NOTE — ED Triage Notes (Signed)
Pt had blood work yesterday and was called today and told to come to ER due to HGB 8.6. pt states she has been weak and has noticed dark stools.

## 2023-08-19 NOTE — Discharge Instructions (Signed)
You also have more fluid in your abdomen before.  Follow-up with your doctors for this.

## 2023-08-25 ENCOUNTER — Other Ambulatory Visit: Payer: Self-pay | Admitting: Family Medicine

## 2023-08-26 ENCOUNTER — Other Ambulatory Visit: Payer: Self-pay | Admitting: Gastroenterology

## 2023-08-26 DIAGNOSIS — Z8711 Personal history of peptic ulcer disease: Secondary | ICD-10-CM

## 2023-08-26 DIAGNOSIS — K921 Melena: Secondary | ICD-10-CM

## 2023-09-03 ENCOUNTER — Telehealth: Payer: Self-pay

## 2023-09-03 NOTE — Telephone Encounter (Signed)
Transition Care Management Unsuccessful Follow-up Telephone Call  Date of discharge and from where:  [name] 8/28  Attempts:  1st Attempt  Reason for unsuccessful TCM follow-up call:  No answer/busy   [name] [place]  [facility], [facility] Guide, Phone: [phone] Website: [email or web address]

## 2023-09-04 ENCOUNTER — Telehealth: Payer: Self-pay

## 2023-09-04 NOTE — Telephone Encounter (Signed)
Transition Care Management Unsuccessful Follow-up Telephone Call  Date of discharge and from where:  [name] 8/28  Attempts:  2nd Attempt  Reason for unsuccessful TCM follow-up call:  No answer/busy   Lenard Forth [place]  [facility], [facility] Guide, Phone: [phone] Website: [email or web address]

## 2023-09-10 DIAGNOSIS — M79676 Pain in unspecified toe(s): Secondary | ICD-10-CM | POA: Diagnosis not present

## 2023-09-10 DIAGNOSIS — B351 Tinea unguium: Secondary | ICD-10-CM | POA: Diagnosis not present

## 2023-09-16 ENCOUNTER — Ambulatory Visit: Payer: Medicare Other | Admitting: Cardiology

## 2023-09-21 ENCOUNTER — Telehealth: Payer: Self-pay | Admitting: Family Medicine

## 2023-09-21 NOTE — Telephone Encounter (Signed)
Returned call, no answer.

## 2023-09-28 NOTE — Telephone Encounter (Signed)
Na  NO CALL BACK  This encounter will closed

## 2023-10-06 ENCOUNTER — Other Ambulatory Visit: Payer: Self-pay | Admitting: Family Medicine

## 2023-10-06 NOTE — Telephone Encounter (Signed)
Error in charting.

## 2023-10-29 ENCOUNTER — Ambulatory Visit: Payer: Medicare Other | Admitting: Family Medicine

## 2023-10-30 ENCOUNTER — Ambulatory Visit: Payer: Medicare Other | Admitting: Family Medicine

## 2023-10-31 ENCOUNTER — Other Ambulatory Visit: Payer: Self-pay | Admitting: Family Medicine

## 2023-11-04 ENCOUNTER — Ambulatory Visit: Payer: Medicare Other | Admitting: Family Medicine

## 2023-11-05 ENCOUNTER — Ambulatory Visit (INDEPENDENT_AMBULATORY_CARE_PROVIDER_SITE_OTHER): Payer: Medicare Other | Admitting: Family Medicine

## 2023-11-05 ENCOUNTER — Encounter: Payer: Self-pay | Admitting: Family Medicine

## 2023-11-05 VITALS — BP 136/72 | HR 92 | Temp 97.4°F | Ht 65.0 in | Wt 134.4 lb

## 2023-11-05 DIAGNOSIS — I152 Hypertension secondary to endocrine disorders: Secondary | ICD-10-CM

## 2023-11-05 DIAGNOSIS — E1159 Type 2 diabetes mellitus with other circulatory complications: Secondary | ICD-10-CM

## 2023-11-05 DIAGNOSIS — R7303 Prediabetes: Secondary | ICD-10-CM | POA: Diagnosis not present

## 2023-11-05 DIAGNOSIS — E119 Type 2 diabetes mellitus without complications: Secondary | ICD-10-CM

## 2023-11-05 DIAGNOSIS — R7989 Other specified abnormal findings of blood chemistry: Secondary | ICD-10-CM | POA: Diagnosis not present

## 2023-11-05 DIAGNOSIS — D649 Anemia, unspecified: Secondary | ICD-10-CM

## 2023-11-05 DIAGNOSIS — I110 Hypertensive heart disease with heart failure: Secondary | ICD-10-CM

## 2023-11-05 DIAGNOSIS — I4819 Other persistent atrial fibrillation: Secondary | ICD-10-CM | POA: Diagnosis not present

## 2023-11-05 DIAGNOSIS — E1169 Type 2 diabetes mellitus with other specified complication: Secondary | ICD-10-CM

## 2023-11-05 DIAGNOSIS — E785 Hyperlipidemia, unspecified: Secondary | ICD-10-CM

## 2023-11-05 DIAGNOSIS — I5032 Chronic diastolic (congestive) heart failure: Secondary | ICD-10-CM

## 2023-11-05 LAB — BAYER DCA HB A1C WAIVED: HB A1C (BAYER DCA - WAIVED): 5.6 % (ref 4.8–5.6)

## 2023-11-05 MED ORDER — BENAZEPRIL HCL 40 MG PO TABS
40.0000 mg | ORAL_TABLET | Freq: Every day | ORAL | 2 refills | Status: DC
Start: 2023-11-05 — End: 2024-07-25

## 2023-11-05 MED ORDER — FUROSEMIDE 40 MG PO TABS: 40.0000 mg | ORAL_TABLET | Freq: Every day | ORAL | 3 refills | Status: DC | PRN

## 2023-11-05 NOTE — Patient Instructions (Addendum)

## 2023-11-05 NOTE — Progress Notes (Signed)
Subjective:  Patient ID: [name], female    DOB: [birth year], 87 y.o.   MRN: [id number]  Patient Care Team: Sonny Masters, FNP as PCP - General (Family Medicine) Marykay Lex, MD as PCP - Cardiology (Cardiology) Michaelle Copas, MD as Referring Physician (Optometry)   Chief Complaint:  Medical Management of Chronic Issues   HPI: [name] is a 87 y.o. female presenting on 11/05/2023 for Medical Management of Chronic Issues   Discussed the use of AI scribe software for clinical note transcription with the patient, who gave verbal consent to proceed.  History of Present Illness   The patient, known with a history of aortic valve replacement, atrial fibrillation, and gastrointestinal issues, presents with ongoing gastrointestinal symptoms. She reports incontinence of urine and stool, which has been persistent and uncontrollable. The patient also mentions experiencing diarrhea, with up to four episodes in the morning. She notes that her stools have been dark recently, a change from the previously reported black stools following a heart procedure and the development of a hematoma on her abdomen. The patient is currently taking iron supplements.  The patient also reports fluctuating blood pressure, which she has been monitoring at home. She denies any associated symptoms during periods of high or low blood pressure. She is currently on a regimen of hydralazine, carvedilol, and benazepril, with Lasix taken as needed. The patient notes occasional swelling in her left foot, which is not constant but appears to be more significant than usual.  The patient also mentions experiencing pain in her abdomen, particularly when going over a bump in her car. The pain is described as being located across the top of her abdomen and has been present since her valve replacement surgery. She denies any new or worsening symptoms related to this pain.  The patient is scheduled to see a  gastroenterologist in the coming weeks for further evaluation of her gastrointestinal symptoms. She is also due for an echocardiogram and a cardiology appointment in the near future. The patient is currently managing her medications with the help of her daughter, and she reports no changes in her medication regimen, except for the addition of an iron tablet.          Relevant past medical, surgical, family, and social history reviewed and updated as indicated.  Allergies and medications reviewed and updated. Data reviewed: Chart in Epic.   Past Medical History:  Diagnosis Date   A-fib [facility])    Anxiety    CAD S/P percutaneous coronary angioplasty 1990s, 11/28/2011    a) midRCA PCI 1999 (NIR BMS 3.0 mm x 32 mm); distal  RCA BMS Sept 2012 (Vision BMS 2.5 mm x 15 mm); 12/02: RCA ISR --> Cutting PTCA    Coronary stent restenosis due to progression of disease 11/30/2011   Dyslipidemia, goal LDL below 70 11/28/2011   Essential hypertension    On ACE inhibitor and ARB? As well as atenolol and hydralazine   Gastric ulcer 11/2018   GERD (gastroesophageal reflux disease)    History of blood transfusion    without reaction   Moderate aortic stenosis by prior echocardiogram 08/2011; 08/2013   Echo 11/09/18: EF 60-65%. Gr 2 DD.  No RWMA.  Mild-Mod AS (mean gradient 19 mmHg). Mild-Mod PAH. 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 Clifton James and Dr. Laneta Simmers   Type 2 diabetes mellitus with complication, without long-term current use of insulin (HCC)    no longer taking diabetic medication - diet controlled    Past  Surgical History:  Procedure Laterality Date   ABDOMINAL HYSTERECTOMY     BIOPSY  06/03/2018   Procedure: BIOPSY;  Surgeon: Malissa Hippo, MD;  Location: [facility];  Service: Endoscopy;;  gastric    BIOPSY  09/15/2022   Procedure: BIOPSY;  Surgeon: Lanelle Bal, DO;  Location: [facility];  Service: Endoscopy;;   BREAST BIOPSY     CARDIAC CATHETERIZATION  2012   performed for angina   CARDIAC CATHETERIZATION     CARDIOVERSION N/A 10/24/2020   Procedure: CARDIOVERSION;  Surgeon: Jake Bathe, MD;  Location: [facility] END[facility];  Service: Cardiovascular;  Laterality: N/A;   CORONARY ANGIOPLASTY  11/2006   Cutting Balloon of stent in the RCA   ESOPHAGOGASTRODUODENOSCOPY N/A 06/03/2018   Procedure: ESOPHAGOGASTRODUODENOSCOPY (EGD);  Surgeon: Malissa Hippo, MD;  Location: [facility];  Service: Endoscopy;  Laterality: N/A;  1:40   ESOPHAGOGASTRODUODENOSCOPY N/A 12/02/2018   Procedure: ESOPHAGOGASTRODUODENOSCOPY (EGD);  Surgeon: Malissa Hippo, MD;  Location: [facility];  Service: Endoscopy;  Laterality: N/A;  255   ESOPHAGOGASTRODUODENOSCOPY (EGD) WITH PROPOFOL N/A 09/15/2022   Procedure: ESOPHAGOGASTRODUODENOSCOPY (EGD) WITH PROPOFOL;  Surgeon: Lanelle Bal, DO;  Location: [facility];  Service: Endoscopy;  Laterality: N/A;  1:00pm, asa 3   LEFT HEART CATHETERIZATION WITH CORONARY ANGIOGRAM N/A 11/27/2011   Procedure: LEFT HEART CATHETERIZATION WITH CORONARY ANGIOGRAM;  Surgeon: Marykay Lex, MD;  Location: [facility] CATH LAB;  Service: Cardiovascular;  Laterality: N/A;   PERCUTANEOUS CORONARY STENT INTERVENTION (PCI-S)  1999   Mid RCA - NIR DMS 3.0 mm x 30 mm, dRCA Vision BMS 2.5 mm x 15 mm    RIGHT HEART CATH AND CORONARY ANGIOGRAPHY N/A 11/24/2022   Procedure: RIGHT HEART CATH AND CORONARY ANGIOGRAPHY;  Surgeon: Marykay Lex, MD;  Location: [facility] INVASIVE CV LAB;  Service: Cardiovascular;  Laterality: N/A;   TEE WITHOUT CARDIOVERSION N/A 01/27/2023   Procedure:  TRANSESOPHAGEAL ECHOCARDIOGRAM;  Surgeon: Kathleene Hazel, MD;  Location: [facility] OR;  Service: Open Heart Surgery;  Laterality: N/A;   TRANSTHORACIC ECHOCARDIOGRAM  10/2018   EF 60-65%. Gr 2 DD.  No RWMA.  Mild-Mod AS  (mean gradient 19 mmHg). Mild-Mod PAH.   TRANSTHORACIC ECHOCARDIOGRAM  03/01/2020   EF 70 to 75%.  No or WMA.  Mild LVH.  GRII DD with severe LA enlargement.  Mildly elevated PAP.  Mild MR.  Moderate AS (mean gradient 17 mmHg) with dimensionless index of 0.32.   TUBAL LIGATION      Social History   Socioeconomic History   Marital status: Widowed    Spouse name: Channing Mutters   Number of children: 3   Years of education: Not on file   Highest education level: Not on file  Occupational History   Occupation: Retired Engineer, civil (consulting)  Tobacco Use   Smoking status: Former   Smokeless tobacco: Never   Tobacco comments:    Quit approximately 1993  Vaping Use   Vaping status: Never Used  Substance and Sexual Activity   Alcohol use: No  Drug use: No   Sexual activity: Not Currently    Birth control/protection: Post-menopausal  Other Topics Concern   Not on file  Social History Narrative   She is a married mother of 3, 2 daughters, 1 son who all live out of state but talks to weekly.   Step-son and his family live next door. Sees often.    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    Quit smoking in '99, denies alcohol.   She volunteers for [employer].   Social Determinants of Health   Financial Resource Strain: Low Risk  (06/24/2023)   Overall Financial Resource Strain (CARDIA)    Difficulty of Paying Living Expenses: Not hard at all  Food Insecurity: No Food Insecurity (06/24/2023)   Hunger Vital Sign    Worried About Running Out of Food in the Last Year: Never true    Ran Out of Food in the Last Year: Never true  Transportation Needs: No Transportation Needs (06/24/2023)   PRAPARE - Administrator, Civil Service (Medical): No    Lack of Transportation  (Non-Medical): No  Physical Activity: Inactive (06/24/2023)   Exercise Vital Sign    Days of Exercise per Week: 0 days    Minutes of Exercise per Session: 0 min  Stress: No Stress Concern Present (06/24/2023)   [employer] of Occupational Health - Occupational Stress Questionnaire    Feeling of Stress : Not at all  Social Connections: Moderately Isolated (06/24/2023)   Social Connection and Isolation Panel [NHANES]    Frequency of Communication with Friends and Family: More than three times a week    Frequency of Social Gatherings with Friends and Family: More than three times a week    Attends Religious Services: 1 to 4 times per year    Active Member of [employer] or Organizations: No    Attends Banker Meetings: Never    Marital Status: Widowed  Intimate Partner Violence: Not At Risk (06/24/2023)   Humiliation, Afraid, Rape, and Kick questionnaire    Fear of Current or Ex-Partner: No    Emotionally Abused: No    Physically Abused: No    Sexually Abused: No    Outpatient Encounter Medications as of 11/05/2023  Medication Sig   apixaban (ELIQUIS) 2.5 MG TABS tablet Take 1 tablet (2.5 mg total) by mouth 2 (two) times daily.   atorvastatin (LIPITOR) 20 MG tablet TAKE ONE (1) TABLET EACH DAY   carvedilol (COREG) 25 MG tablet Take 1 tablet (25 mg total) by mouth 2 (two) times daily.   diphenhydramine-acetaminophen (TYLENOL PM) 25-500 MG TABS tablet Take 2 tablets by mouth at bedtime.   hydrALAZINE (APRESOLINE) 100 MG tablet TAKE ONE (1) TABLET BY MOUTH TWO (2) TIMES DAILY   Iron, Ferrous Sulfate, 325 (65 Fe) MG TABS Take 325 mg by mouth daily.   loperamide (IMODIUM A-D) 2 MG tablet Take 2 mg by mouth 4 (four) times daily as needed for diarrhea or loose stools.   NITROSTAT 0.4 MG SL tablet DISSOLVE 1 TAB UNDER TOUNGE FOR CHEST PAIN. MAY REPEAT EVERY 5 MINUTES FOR 3 DOSES. IF NO RELIEF CALL 911 OR GO TO ER   pantoprazole (PROTONIX) 40 MG tablet TAKE 1 TABLET BY MOUTH TWICE DAILY  BEFORE MEALS   sertraline (ZOLOFT) 25 MG tablet Take 1 tablet (25 mg total) by mouth at bedtime.   [DISCONTINUED] benazepril (LOTENSIN) 40 MG tablet TAKE ONE (1) TABLET BY MOUTH EVERY DAY   [  DISCONTINUED] furosemide (LASIX) 40 MG tablet Take 1 tablet (40 mg total) by mouth daily as needed for fluid.   benazepril (LOTENSIN) 40 MG tablet Take 1 tablet (40 mg total) by mouth daily.   furosemide (LASIX) 40 MG tablet Take 1 tablet (40 mg total) by mouth daily as needed for fluid.   No facility-administered encounter medications on file as of 11/05/2023.    Allergies  Allergen Reactions   Valium Nausea And Vomiting    Pertinent ROS per HPI, otherwise unremarkable      Objective:  BP 136/72   Pulse 92   Temp (!) 97.4 F (36.3 C)   Ht 5\' 5"  (1.651 m)   Wt 134 lb 6.4 oz (61 kg)   SpO2 98%   BMI 22.37 kg/m    Wt Readings from Last 3 Encounters:  11/05/23 134 lb 6.4 oz (61 kg)  08/19/23 125 lb (56.7 kg)  07/29/23 132 lb (59.9 kg)    Physical Exam Vitals and nursing note reviewed.  Constitutional:      Appearance: Normal appearance.  HENT:     Head: Normocephalic and atraumatic.     Nose: Nose normal.     Mouth/Throat:     Mouth: Mucous membranes are moist.     Pharynx: Oropharynx is clear.  Eyes:     Conjunctiva/sclera: Conjunctivae normal.     Pupils: Pupils are equal, round, and reactive to light.  Cardiovascular:     Rate and Rhythm: Normal rate. Rhythm irregularly irregular.     Heart sounds: Murmur heard.     Systolic murmur is present with a grade of 1/6.  Pulmonary:     Effort: Pulmonary effort is normal.     Breath sounds: Normal breath sounds.  Musculoskeletal:     Right lower leg: Edema (trace) present.     Left lower leg: 1+ Edema present.  Skin:    General: Skin is warm and dry.     Capillary Refill: Capillary refill takes less than 2 seconds.  Neurological:     General: No focal deficit present.     Mental Status: She is alert and oriented to person,  place, and time.     Gait: Gait abnormal (using rollator).  Psychiatric:        Mood and Affect: Mood normal.        Behavior: Behavior normal.        Thought Content: Thought content normal.        Judgment: Judgment normal.     Results for orders placed or performed during the hospital encounter of 08/19/23  Comprehensive metabolic panel  Result Value Ref Range   Sodium 139 135 - 145 mmol/L   Potassium 4.4 3.5 - 5.1 mmol/L   Chloride 101 98 - 111 mmol/L   CO2 26 22 - 32 mmol/L   Glucose, Bld 120 (H) 70 - 99 mg/dL   BUN 21 8 - 23 mg/dL   Creatinine, Ser 1.61 0.44 - 1.00 mg/dL   Calcium 9.5 8.9 - 09.6 mg/dL   Total Protein 7.5 6.5 - 8.1 g/dL   Albumin 4.1 3.5 - 5.0 g/dL   AST 19 15 - 41 U/L   ALT 12 0 - 44 U/L   Alkaline Phosphatase 86 38 - 126 U/L   Total Bilirubin 0.5 0.3 - 1.2 mg/dL   GFR, Estimated >04 >54 mL/min   Anion gap 12 5 - 15  CBC  Result Value Ref Range   WBC 5.8 4.0 - 10.5  K/uL   RBC 3.15 (L) 3.87 - 5.11 MIL/uL   Hemoglobin 9.2 (L) 12.0 - 15.0 g/dL   HCT 86.5 (L) 78.4 - 69.6 %   MCV 94.0 80.0 - 100.0 fL   MCH 29.2 26.0 - 34.0 pg   MCHC 31.1 30.0 - 36.0 g/dL   RDW 29.5 28.4 - 13.2 %   Platelets 215 150 - 400 K/uL   nRBC 0.0 0.0 - 0.2 %  Type and screen [facility]  Result Value Ref Range   ABO/RH(D) O POS    Antibody Screen NEG    Sample Expiration      08/22/2023,2359 Performed at [facility], [address], [place], [place] [address]        Pertinent labs & imaging results that were available during my care of the patient were reviewed by me and considered in my medical decision making.  Assessment & Plan:  Alaija was seen today for medical management of chronic issues.  Diagnoses and all orders for this visit:  Hyperlipidemia associated with type 2 diabetes mellitus (HCC) -     CMP14+EGFR -     Lipid panel  Hypertensive heart disease with chronic diastolic congestive heart failure (HCC) -     CMP14+EGFR -     Lipid panel -      Anemia Profile B -     benazepril (LOTENSIN) 40 MG tablet; Take 1 tablet (40 mg total) by mouth daily. -     furosemide (LASIX) 40 MG tablet; Take 1 tablet (40 mg total) by mouth daily as needed for fluid. -     Microalbumin / creatinine urine ratio  Hypertension associated with diabetes (HCC) -     Cancel: CBC with Differential/Platelet -     CMP14+EGFR -     Lipid panel -     Anemia Profile B -     benazepril (LOTENSIN) 40 MG tablet; Take 1 tablet (40 mg total) by mouth daily. -     furosemide (LASIX) 40 MG tablet; Take 1 tablet (40 mg total) by mouth daily as needed for fluid. -     Microalbumin / creatinine urine ratio  Anemia, unspecified type -     Anemia Profile B  Diet-controlled diabetes mellitus (HCC) -     Bayer DCA Hb A1c Waived -     CMP14+EGFR -     Lipid panel -     Anemia Profile B -     Microalbumin / creatinine urine ratio  Persistent atrial fibrillation (HCC) -     CMP14+EGFR -     Lipid panel -     Anemia Profile B     Assessment and Plan    Gastrointestinal Issues Chronic gastrointestinal symptoms including incontinence, diarrhea, and white stools. Reports black stools, possibly due to iron supplementation or previous hematoma. No current evidence of gastrointestinal bleeding per recent ER visit. - Continue ferrous sulfate 325 mg daily. - Follow up with Dr. Verlon Au for gastrointestinal evaluation.  Hypertension Significant blood pressure fluctuations. Currently on hydralazine, carvedilol, benazepril, and as-needed furosemide. Variability may be influenced by emotional stress or other factors. - Continue current antihypertensive medications. - Monitor blood pressure regularly.  Edema Intermittent left foot swelling, worse than the right. TED hose helps reduce swelling. - Continue using TED hose. - Use furosemide as needed for swelling.  Atrial Fibrillation Well-managed atrial fibrillation with occasional shortness of breath. No significant changes  or symptoms of heart failure. - Continue current management. - Follow  up with cardiology on February 12 for echocardiogram.  Diabetes Mellitus A1c is 5.6, indicating good control without medication. No increased hunger, thirst, or urination reported. - Continue current management without medication.  Hearing Loss Difficulty hearing, especially the TV. Awaiting hearing aids. Informed that the brain will adjust to the new hearing aids over time. - Proceed with getting hearing aids.  General Health Maintenance Well-supported by family members who assist with meals and medication management. Blood pressures recorded show significant fluctuations. - Obtain a urine sample to check kidney function. - Refill benazepril and furosemide prescriptions.  Follow-up - Follow up with Verlon Au for gastrointestinal issues. - Follow up with cardiology on February 12 for echocardiogram.       Continue all other maintenance medications.  Follow up plan: Return in about 3 months (around 02/05/2024), or if symptoms worsen or fail to improve, for DM.   Continue healthy lifestyle choices, including diet (rich in fruits, vegetables, and lean proteins, and low in salt and simple carbohydrates) and exercise (at least 30 minutes of moderate physical activity daily).  Educational handout given for DM  The above assessment and management plan was discussed with the patient. The patient verbalized understanding of and has agreed to the management plan. Patient is aware to call the clinic if they develop any new symptoms or if symptoms persist or worsen. Patient is aware when to return to the clinic for a follow-up visit. Patient educated on when it is appropriate to go to the emergency department.   Kari Baars, FNP-C [facility] [phone]

## 2023-11-06 LAB — ANEMIA PROFILE B
Basophils Absolute: 0 10*3/uL (ref 0.0–0.2)
Basos: 1 %
EOS (ABSOLUTE): 0.2 10*3/uL (ref 0.0–0.4)
Eos: 4 %
Ferritin: 71 ng/mL (ref 15–150)
Folate: >20 ng/mL (ref >3.0)
Hematocrit: 28.6 % — ABNORMAL LOW (ref 34.0–46.6)
Hemoglobin: 8.9 g/dL — CL (ref 11.1–15.9)
Immature Grans (Abs): 0 10*3/uL (ref 0.0–0.1)
Immature Granulocytes: 0 %
Iron Saturation: 12 % — ABNORMAL LOW (ref 15–55)
Iron: 34 ug/dL (ref 27–139)
Lymphocytes Absolute: 0.8 10*3/uL (ref 0.7–3.1)
Lymphs: 15 %
MCH: 28.2 pg (ref 26.6–33.0)
MCHC: 31.1 g/dL — ABNORMAL LOW (ref 31.5–35.7)
MCV: 91 fL (ref 79–97)
Monocytes Absolute: 0.5 10*3/uL (ref 0.1–0.9)
Monocytes: 9 %
Neutrophils Absolute: 3.9 10*3/uL (ref 1.4–7.0)
Neutrophils: 71 %
Platelets: 248 10*3/uL (ref 150–450)
RBC: 3.16 x10E6/uL — ABNORMAL LOW (ref 3.77–5.28)
RDW: 14.3 % (ref 11.7–15.4)
Retic Ct Pct: 2.1 % (ref 0.6–2.6)
Total Iron Binding Capacity: 281 ug/dL (ref 250–450)
UIBC: 247 ug/dL (ref 118–369)
Vitamin B-12: 414 pg/mL (ref 232–1245)
WBC: 5.5 10*3/uL (ref 3.4–10.8)

## 2023-11-06 LAB — CMP14+EGFR
ALT: 8 [IU]/L (ref 0–32)
AST: 19 [IU]/L (ref 0–40)
Albumin: 4.3 g/dL (ref 3.7–4.7)
Alkaline Phosphatase: 93 [IU]/L (ref 44–121)
BUN/Creatinine Ratio: 24 (ref 12–28)
BUN: 18 mg/dL (ref 8–27)
Bilirubin Total: 0.3 mg/dL (ref 0.0–1.2)
CO2: 21 mmol/L (ref 20–29)
Calcium: 9.8 mg/dL (ref 8.7–10.3)
Chloride: 103 mmol/L (ref 96–106)
Creatinine, Ser: 0.75 mg/dL (ref 0.57–1.00)
Globulin, Total: 3 g/dL (ref 1.5–4.5)
Glucose: 107 mg/dL — ABNORMAL HIGH (ref 70–99)
Potassium: 5 mmol/L (ref 3.5–5.2)
Sodium: 142 mmol/L (ref 134–144)
Total Protein: 7.3 g/dL (ref 6.0–8.5)
eGFR: 78 mL/min/{1.73_m2} (ref >59)

## 2023-11-06 LAB — LIPID PANEL
Chol/HDL Ratio: 2.6 ratio (ref 0.0–4.4)
Cholesterol, Total: 103 mg/dL (ref 100–199)
HDL: 40 mg/dL (ref >39)
LDL Chol Calc (NIH): 46 mg/dL (ref 0–99)
Triglycerides: 83 mg/dL (ref 0–149)
VLDL Cholesterol Cal: 17 mg/dL (ref 5–40)

## 2023-11-06 NOTE — Addendum Note (Signed)
Addended by: Lorelee Cover C on: 11/06/2023 03:30 PM   Modules accepted: Orders

## 2023-11-09 ENCOUNTER — Telehealth: Payer: Self-pay | Admitting: Family Medicine

## 2023-11-09 ENCOUNTER — Other Ambulatory Visit: Payer: Self-pay | Admitting: Family Medicine

## 2023-11-09 DIAGNOSIS — F339 Major depressive disorder, recurrent, unspecified: Secondary | ICD-10-CM

## 2023-11-09 NOTE — Telephone Encounter (Signed)
Called to ask why they want to stop meds what is the reason please get that information when patient calls back. NA   Austin Miles CMA

## 2023-11-09 NOTE — Telephone Encounter (Signed)
Copied from CRM [id number]. Topic: Clinical - Medication Question >> Nov 09, 2023 11:02 AM Raven B wrote: Reason for CRM: PT daughter  Darrold Junker wants to know if PT can discontinue/stop taking atorvastatin. Call back # [phone]

## 2023-11-10 ENCOUNTER — Encounter: Payer: Self-pay | Admitting: Gastroenterology

## 2023-11-10 ENCOUNTER — Ambulatory Visit: Payer: Medicare Other | Admitting: Gastroenterology

## 2023-11-10 VITALS — BP 171/68 | HR 85 | Temp 98.6°F | Ht 65.0 in | Wt 136.8 lb

## 2023-11-10 DIAGNOSIS — I864 Gastric varices: Secondary | ICD-10-CM

## 2023-11-10 DIAGNOSIS — K219 Gastro-esophageal reflux disease without esophagitis: Secondary | ICD-10-CM | POA: Diagnosis not present

## 2023-11-10 DIAGNOSIS — D649 Anemia, unspecified: Secondary | ICD-10-CM

## 2023-11-10 DIAGNOSIS — R159 Full incontinence of feces: Secondary | ICD-10-CM | POA: Diagnosis not present

## 2023-11-10 DIAGNOSIS — R188 Other ascites: Secondary | ICD-10-CM | POA: Diagnosis not present

## 2023-11-10 DIAGNOSIS — K7460 Unspecified cirrhosis of liver: Secondary | ICD-10-CM | POA: Diagnosis not present

## 2023-11-10 NOTE — Progress Notes (Signed)
GI Office Note    Referring Provider: Sonny Masters, FNP Primary Care Physician:  Sonny Masters, FNP  Primary Gastroenterologist: Roetta Sessions, MD   Chief Complaint   Chief Complaint  Patient presents with   Follow-up    Follow up on anemia. Pt had blood work done at [facility]    History of Present Illness   [name] is a 87 y.o. female presenting today for follow up. Last seen 05/2023. H/o GERD, anemia, fecal incontinence, abnormal LFTs with questionable cirrhosis on imaging on prior studies. Patient's daughters have previously requested holding off on colonoscopy if possible.  Today: still having trouble with anemia. She required prbcs after TAVR, in part due to extraperitoneal hematoma. Hgb 10 in 05/2023, down to 9.2 in 07/2023 and last week was 8.9. Iron sat 12% with normal ferritin of 71 and iron 34. She is chronically on iron. Stools dark on iron. Goes back to lab later this week for hgb recheck. Abdominal pain much improved from before. Appetite is good. Denies lightheadedness, fatigue. Only gets short of breath with taking a shower. Typically otherwise no issues. Chronic incontinence to urine and stool. Stools are more formed now however. No brbpr.     CT A/P with contrast 07/2023: IMPRESSION: 1. No evidence of retroperitoneal hemorrhage. 2. Morphologic changes of cirrhosis and portal hypertension (normal spleen, few gastric varices). 3. Large ascites. 4. Trace bilateral pleural effusions with atelectasis. 5. Moderate hiatal hernia. 6. Cardiomegaly with coronary artery calcifications. 7. Aortic atherosclerosis.  EGD November 2023 for abdominal pain: -Gastritis, reactive gastropathy on path, negative for H. Pylori  EGD 11/2018: - Normal esophagus. Healed esophagitis. - Z-line irregular, 37 cm from the incisors. - 2 cm hiatal hernia. - Erythematous mucosa in the antrum. - Two scars in the gastric antrum indicative of healed ulcers. - Normal  duodenal bulb and second portion of the duodenum. - No specimens collected.   EGD 05/2018 - LA Grade A reflux esophagitis. - 3 cm hiatal hernia. - Non-bleeding gastric ulcers. Biopsied. No H.pylori - Erosive gastropathy. - Normal duodenal bulb and second portion of the duodenum.  Colonoscopy 06/2012:normal colon   Medications   Current Outpatient Medications  Medication Sig Dispense Refill   apixaban (ELIQUIS) 2.5 MG TABS tablet Take 1 tablet (2.5 mg total) by mouth 2 (two) times daily. 180 tablet 3   atorvastatin (LIPITOR) 20 MG tablet TAKE ONE (1) TABLET EACH DAY 90 tablet 2   benazepril (LOTENSIN) 40 MG tablet Take 1 tablet (40 mg total) by mouth daily. 90 tablet 2   carvedilol (COREG) 25 MG tablet Take 1 tablet (25 mg total) by mouth 2 (two) times daily. 180 tablet 1   diphenhydramine-acetaminophen (TYLENOL PM) 25-500 MG TABS tablet Take 2 tablets by mouth at bedtime.     furosemide (LASIX) 40 MG tablet Take 1 tablet (40 mg total) by mouth daily as needed for fluid. 90 tablet 3   hydrALAZINE (APRESOLINE) 100 MG tablet TAKE ONE (1) TABLET BY MOUTH TWO (2) TIMES DAILY 60 tablet 0   Iron, Ferrous Sulfate, 325 (65 Fe) MG TABS Take 325 mg by mouth daily. 30 tablet 3   loperamide (IMODIUM A-D) 2 MG tablet Take 2 mg by mouth 4 (four) times daily as needed for diarrhea or loose stools.     NITROSTAT 0.4 MG SL tablet DISSOLVE 1 TAB UNDER TOUNGE FOR CHEST PAIN. MAY REPEAT EVERY 5 MINUTES FOR 3 DOSES. IF NO RELIEF CALL 911 OR GO  TO ER 25 tablet 3   pantoprazole (PROTONIX) 40 MG tablet TAKE 1 TABLET BY MOUTH TWICE DAILY BEFORE MEALS 60 tablet 3   sertraline (ZOLOFT) 25 MG tablet TAKE ONE TABLET BY MOUTH AT BEDTIME 90 tablet 0   No current facility-administered medications for this visit.    Allergies   Allergies as of 11/10/2023 - Review Complete 11/10/2023  Allergen Reaction Noted   Valium Nausea And Vomiting 11/27/2011      Review of Systems   General: Negative for anorexia, weight  loss, fever, chills, fatigue, weakness. ENT: Negative for hoarseness, difficulty swallowing , nasal congestion. CV: Negative for chest pain, angina, palpitations, dyspnea on exertion, peripheral edema.  Respiratory: Negative for dyspnea at rest, dyspnea on exertion, cough, sputum, wheezing.  GI: See history of present illness. GU:  Negative for dysuria, hematuria,  urinary frequency, nocturnal urination. +urinary incontinence Endo: Negative for unusual weight change.     Physical Exam   BP (!) 171/68   Pulse 85   Temp 98.6 F (37 C)   Ht 5\' 5"  (1.651 m)   Wt 136 lb 12.8 oz (62.1 kg)   BMI 22.76 kg/m    General: Well-nourished, well-developed in no acute distress.  Eyes: No icterus. Mouth: Oropharyngeal mucosa moist and pink , no lesions erythema or exudate. Lungs: Clear to auscultation bilaterally.  Heart: Regular rate and rhythm, no murmurs rubs or gallops.  Abdomen: Bowel sounds are normal, nontender, nondistended, no hepatosplenomegaly or masses,  no abdominal bruits or hernia , no rebound or guarding.  Rectal: not performed  Extremities: trace bilateral lower extremity edema. No clubbing or deformities. Neuro: Alert and oriented x 4   Skin: Warm and dry, no jaundice.   Psych: Alert and cooperative, normal mood and affect.  Labs   Lab Results  Component Value Date   NA 142 11/05/2023   CL 103 11/05/2023   K 5.0 11/05/2023   CO2 21 11/05/2023   BUN 18 11/05/2023   CREATININE 0.75 11/05/2023   EGFR 78 11/05/2023   CALCIUM 9.8 11/05/2023   ALBUMIN 4.3 11/05/2023   GLUCOSE 107 (H) 11/05/2023   Lab Results  Component Value Date   ALT 8 11/05/2023   AST 19 11/05/2023   ALKPHOS 93 11/05/2023   BILITOT 0.3 11/05/2023   Lab Results  Component Value Date   WBC 5.5 11/05/2023   HGB 8.9 (LL) 11/05/2023   HCT 28.6 (L) 11/05/2023   MCV 91 11/05/2023   PLT 248 11/05/2023   Lab Results  Component Value Date   IRON 34 11/05/2023   TIBC 281 11/05/2023   FERRITIN 71  11/05/2023   Lab Results  Component Value Date   HGBA1C 5.6 11/05/2023   Lab Results  Component Value Date   VITAMINB12 414 11/05/2023   Lab Results  Component Value Date   FOLATE >20.0 11/05/2023    Imaging Studies   No results found.  Assessment/Plan:   GERD/hiatal hernia:  -controlled on PPI BID   Fecal incontinence: -chronic, stable -consistency of stool improves with imodium, patient taking prn only right now  Anemia: -stools remain very dark, she will hold iron for one week to see if lighten up. I suspect stool color related to iron as oppose to true melena -monitor for overt GI bleeding -update labs as planned with PCP this week -patient desires holding off on colonoscopy at this time. If drop in Hgb further or persistent dark stools, could consider updating EGD.  Cirrhosis: -noted on more recent  imaging in the past year -no decompensated events to date, CT 07/2023 with few gastric varices, normal spleen, mention of large ascites in all 4 quadrants but no significant ascites on exam today, platelets normal. -patient is interested in following labs and hepatoma screening with u/s every six months.  -update labs at this time   Return ov in four months.   Leanna Battles. Melvyn Neth, MHS, PA-C [facility]

## 2023-11-10 NOTE — Patient Instructions (Signed)
We will have you update labs as discussed today. Please be sure to take orders when you go to the lab. We will be in touch with results as available. Please hold iron for about a week and see if your stools return to normal color. If your stools, stay black, please let us know as you may need repeat upper endoscopy. We will continue to monitor your liver every six months with labs and ultrasound.  Continue pantoprazole twice daily before meals. Use imodium 2mg  once or twice daily as needed to help control incontinence.  Return office visit in four months.

## 2023-11-12 ENCOUNTER — Other Ambulatory Visit: Payer: Medicare Other

## 2023-11-12 DIAGNOSIS — R7989 Other specified abnormal findings of blood chemistry: Secondary | ICD-10-CM | POA: Diagnosis not present

## 2023-11-12 DIAGNOSIS — I5032 Chronic diastolic (congestive) heart failure: Secondary | ICD-10-CM | POA: Diagnosis not present

## 2023-11-12 DIAGNOSIS — I152 Hypertension secondary to endocrine disorders: Secondary | ICD-10-CM | POA: Diagnosis not present

## 2023-11-12 DIAGNOSIS — D649 Anemia, unspecified: Secondary | ICD-10-CM

## 2023-11-12 DIAGNOSIS — K7460 Unspecified cirrhosis of liver: Secondary | ICD-10-CM | POA: Diagnosis not present

## 2023-11-12 DIAGNOSIS — K219 Gastro-esophageal reflux disease without esophagitis: Secondary | ICD-10-CM | POA: Diagnosis not present

## 2023-11-12 DIAGNOSIS — I110 Hypertensive heart disease with heart failure: Secondary | ICD-10-CM | POA: Diagnosis not present

## 2023-11-12 DIAGNOSIS — E119 Type 2 diabetes mellitus without complications: Secondary | ICD-10-CM | POA: Diagnosis not present

## 2023-11-12 DIAGNOSIS — R188 Other ascites: Secondary | ICD-10-CM | POA: Diagnosis not present

## 2023-11-12 DIAGNOSIS — E1159 Type 2 diabetes mellitus with other circulatory complications: Secondary | ICD-10-CM | POA: Diagnosis not present

## 2023-11-13 LAB — ANEMIA PROFILE B
Basophils Absolute: 0 10*3/uL (ref 0.0–0.2)
Basos: 1 %
EOS (ABSOLUTE): 0.2 10*3/uL (ref 0.0–0.4)
Eos: 3 %
Ferritin: 63 ng/mL (ref 15–150)
Folate: 17.2 ng/mL (ref >3.0)
Hematocrit: 30.7 % — ABNORMAL LOW (ref 34.0–46.6)
Hemoglobin: 9.4 g/dL — ABNORMAL LOW (ref 11.1–15.9)
Immature Grans (Abs): 0 10*3/uL (ref 0.0–0.1)
Immature Granulocytes: 0 %
Iron Saturation: 12 % — ABNORMAL LOW (ref 15–55)
Iron: 36 ug/dL (ref 27–139)
Lymphocytes Absolute: 0.8 10*3/uL (ref 0.7–3.1)
Lymphs: 15 %
MCH: 28.4 pg (ref 26.6–33.0)
MCHC: 30.6 g/dL — ABNORMAL LOW (ref 31.5–35.7)
MCV: 93 fL (ref 79–97)
Monocytes Absolute: 0.4 10*3/uL (ref 0.1–0.9)
Monocytes: 8 %
Neutrophils Absolute: 4 10*3/uL (ref 1.4–7.0)
Neutrophils: 73 %
Platelets: 259 10*3/uL (ref 150–450)
RBC: 3.31 x10E6/uL — ABNORMAL LOW (ref 3.77–5.28)
RDW: 14.5 % (ref 11.7–15.4)
Retic Ct Pct: 1.7 % (ref 0.6–2.6)
Total Iron Binding Capacity: 290 ug/dL (ref 250–450)
UIBC: 254 ug/dL (ref 118–369)
Vitamin B-12: 447 pg/mL (ref 232–1245)
WBC: 5.5 10*3/uL (ref 3.4–10.8)

## 2023-11-13 LAB — CMP14+EGFR
ALT: 7 [IU]/L (ref 0–32)
AST: 13 [IU]/L (ref 0–40)
Albumin: 4.3 g/dL (ref 3.7–4.7)
Alkaline Phosphatase: 89 [IU]/L (ref 44–121)
BUN/Creatinine Ratio: 19 (ref 12–28)
BUN: 15 mg/dL (ref 8–27)
Bilirubin Total: 0.5 mg/dL (ref 0.0–1.2)
CO2: 24 mmol/L (ref 20–29)
Calcium: 9.6 mg/dL (ref 8.7–10.3)
Chloride: 103 mmol/L (ref 96–106)
Creatinine, Ser: 0.8 mg/dL (ref 0.57–1.00)
Globulin, Total: 3.2 g/dL (ref 1.5–4.5)
Glucose: 110 mg/dL — ABNORMAL HIGH (ref 70–99)
Potassium: 4.5 mmol/L (ref 3.5–5.2)
Sodium: 140 mmol/L (ref 134–144)
Total Protein: 7.5 g/dL (ref 6.0–8.5)
eGFR: 72 mL/min/{1.73_m2} (ref >59)

## 2023-11-13 LAB — CBC WITH DIFFERENTIAL/PLATELET
Basophils Absolute: 0 10*3/uL (ref 0.0–0.2)
Basos: 1 %
EOS (ABSOLUTE): 0.2 10*3/uL (ref 0.0–0.4)
Eos: 4 %
Hematocrit: 31.1 % — ABNORMAL LOW (ref 34.0–46.6)
Hemoglobin: 9.6 g/dL — ABNORMAL LOW (ref 11.1–15.9)
Immature Grans (Abs): 0 10*3/uL (ref 0.0–0.1)
Immature Granulocytes: 0 %
Lymphocytes Absolute: 0.8 10*3/uL (ref 0.7–3.1)
Lymphs: 14 %
MCH: 28.2 pg (ref 26.6–33.0)
MCHC: 30.9 g/dL — ABNORMAL LOW (ref 31.5–35.7)
MCV: 92 fL (ref 79–97)
Monocytes Absolute: 0.4 10*3/uL (ref 0.1–0.9)
Monocytes: 7 %
Neutrophils Absolute: 4.3 10*3/uL (ref 1.4–7.0)
Neutrophils: 74 %
Platelets: 267 10*3/uL (ref 150–450)
RBC: 3.4 x10E6/uL — ABNORMAL LOW (ref 3.77–5.28)
RDW: 14.3 % (ref 11.7–15.4)
WBC: 5.7 10*3/uL (ref 3.4–10.8)

## 2023-11-13 LAB — COMPREHENSIVE METABOLIC PANEL
ALT: 8 [IU]/L (ref 0–32)
AST: 16 [IU]/L (ref 0–40)
Albumin: 4.3 g/dL (ref 3.7–4.7)
Alkaline Phosphatase: 88 [IU]/L (ref 44–121)
BUN/Creatinine Ratio: 18 (ref 12–28)
BUN: 15 mg/dL (ref 8–27)
Bilirubin Total: 0.4 mg/dL (ref 0.0–1.2)
CO2: 23 mmol/L (ref 20–29)
Calcium: 9.8 mg/dL (ref 8.7–10.3)
Chloride: 101 mmol/L (ref 96–106)
Creatinine, Ser: 0.84 mg/dL (ref 0.57–1.00)
Globulin, Total: 3.1 g/dL (ref 1.5–4.5)
Glucose: 110 mg/dL — ABNORMAL HIGH (ref 70–99)
Potassium: 4.4 mmol/L (ref 3.5–5.2)
Sodium: 141 mmol/L (ref 134–144)
Total Protein: 7.4 g/dL (ref 6.0–8.5)
eGFR: 68 mL/min/{1.73_m2} (ref >59)

## 2023-11-13 LAB — PROTIME-INR
INR: 1.2 (ref 0.9–1.2)
Prothrombin Time: 13.5 s — ABNORMAL HIGH (ref 9.1–12.0)

## 2023-11-13 LAB — AFP TUMOR MARKER: AFP, Serum, Tumor Marker: 1.9 ng/mL (ref 0.0–8.7)

## 2023-11-14 LAB — MICROALBUMIN / CREATININE URINE RATIO
Creatinine, Urine: 93 mg/dL
Microalb/Creat Ratio: 45 mg/g{creat} — ABNORMAL HIGH (ref 0–29)
Microalbumin, Urine: 42 ug/mL

## 2023-11-23 NOTE — Telephone Encounter (Signed)
NA °No call back  °This encounter will be closed  °

## 2023-11-27 ENCOUNTER — Other Ambulatory Visit: Payer: Self-pay | Admitting: Family Medicine

## 2023-12-04 ENCOUNTER — Other Ambulatory Visit: Payer: Self-pay | Admitting: Gastroenterology

## 2023-12-04 DIAGNOSIS — K921 Melena: Secondary | ICD-10-CM

## 2023-12-04 DIAGNOSIS — Z8711 Personal history of peptic ulcer disease: Secondary | ICD-10-CM

## 2023-12-10 DIAGNOSIS — M79676 Pain in unspecified toe(s): Secondary | ICD-10-CM | POA: Diagnosis not present

## 2023-12-10 DIAGNOSIS — L84 Corns and callosities: Secondary | ICD-10-CM | POA: Diagnosis not present

## 2023-12-10 DIAGNOSIS — E1142 Type 2 diabetes mellitus with diabetic polyneuropathy: Secondary | ICD-10-CM | POA: Diagnosis not present

## 2023-12-10 DIAGNOSIS — B351 Tinea unguium: Secondary | ICD-10-CM | POA: Diagnosis not present

## 2024-01-26 ENCOUNTER — Encounter: Payer: Self-pay | Admitting: Family Medicine

## 2024-01-26 ENCOUNTER — Ambulatory Visit: Payer: Medicare Other | Admitting: Family Medicine

## 2024-01-26 VITALS — BP 162/76 | HR 83 | Temp 97.9°F | Ht 65.0 in | Wt 135.2 lb

## 2024-01-26 DIAGNOSIS — R21 Rash and other nonspecific skin eruption: Secondary | ICD-10-CM | POA: Diagnosis not present

## 2024-01-26 MED ORDER — TRIAMCINOLONE ACETONIDE 0.1 % EX CREA
1.0000 | TOPICAL_CREAM | Freq: Two times a day (BID) | CUTANEOUS | 0 refills | Status: DC
Start: 2024-01-26 — End: 2024-02-05

## 2024-01-26 NOTE — Progress Notes (Signed)
 Subjective:  Patient ID: [name], female    DOB: Mar 31, 1936, 88 y.o.   MRN: [id number]  Patient Care Team: Severa Rock HERO, FNP as PCP - General (Family Medicine) Anner Alm ORN, MD as PCP - Cardiology (Cardiology) Ladora Ross Lacy Phebe, MD as Referring Physician (Optometry)   Chief Complaint:  Rash (Rash on right side of bottom for 1 week. Pt states it stings and burns)   HPI: [name] is a 88 y.o. female presenting on 01/26/2024 for Rash (Rash on right side of bottom for 1 week. Pt states it stings and burns)   Rash This is a new problem. The current episode started in the past 7 days. The problem has been waxing and waning since onset. Location: right buttock. The rash is characterized by blistering, itchiness, burning and redness. She was exposed to nothing. Pertinent negatives include no anorexia, congestion, cough, eye pain, facial edema, fatigue, fever, joint pain, nail changes, rhinorrhea, shortness of breath, sore throat or vomiting. Past treatments include anti-itch cream and moisturizer. The treatment provided no relief.     Relevant past medical, surgical, family, and social history reviewed and updated as indicated.  Allergies and medications reviewed and updated. Data reviewed: Chart in Epic.   Past Medical History:  Diagnosis Date   A-fib [facility])    Anxiety    CAD S/P percutaneous coronary angioplasty 1990s, 11/28/2011    a) midRCA PCI 1999 (NIR BMS 3.0 mm x 32 mm); distal  RCA BMS Sept 2012 (Vision BMS 2.5 mm x 15 mm); 12/02: RCA ISR --> Cutting PTCA    Coronary stent restenosis due to progression of disease 11/30/2011   Dyslipidemia, goal LDL below 70 11/28/2011   Essential hypertension    On ACE inhibitor and ARB? As well as atenolol  and hydralazine    Gastric ulcer 11/2018   GERD (gastroesophageal reflux disease)    History of blood transfusion    without reaction   Moderate aortic stenosis by prior echocardiogram 08/2011; 08/2013   Echo 11/09/18:  EF 60-65%. Gr 2 DD.  No RWMA.  Mild-Mod AS (mean gradient 19 mmHg). Mild-Mod PAH. 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 Verlin and Dr. Lucas   Type 2 diabetes mellitus with complication, without long-term current use of insulin  (HCC)    no longer taking diabetic medication - diet controlled    Past Surgical History:  Procedure Laterality Date   ABDOMINAL HYSTERECTOMY     BIOPSY  06/03/2018   Procedure: BIOPSY;  Surgeon: Golda Claudis PENNER, MD;  Location: [facility];  Service: Endoscopy;;  gastric    BIOPSY  09/15/2022   Procedure: BIOPSY;  Surgeon: Cindie Carlin POUR, DO;  Location: [facility];  Service: Endoscopy;;   BREAST BIOPSY     CARDIAC CATHETERIZATION  2012   performed for angina   CARDIAC CATHETERIZATION     CARDIOVERSION N/A 10/24/2020   Procedure: CARDIOVERSION;  Surgeon: Jeffrie Oneil BROCKS, MD;  Location: [facility];  Service: Cardiovascular;  Laterality: N/A;   CORONARY ANGIOPLASTY  11/2006   Cutting Balloon of stent  in the RCA   ESOPHAGOGASTRODUODENOSCOPY N/A 06/03/2018   Procedure: ESOPHAGOGASTRODUODENOSCOPY (EGD);  Surgeon: Golda Claudis PENNER, MD;  Location: [facility];  Service: Endoscopy;  Laterality: N/A;  1:40   ESOPHAGOGASTRODUODENOSCOPY N/A 12/02/2018   Procedure: ESOPHAGOGASTRODUODENOSCOPY (EGD);  Surgeon: Golda Claudis PENNER, MD;  Location: [facility];  Service: Endoscopy;  Laterality: N/A;  255   ESOPHAGOGASTRODUODENOSCOPY (EGD) WITH PROPOFOL  N/A 09/15/2022   Procedure: ESOPHAGOGASTRODUODENOSCOPY (EGD) WITH PROPOFOL ;  Surgeon: Cindie Carlin POUR, DO;  Location: [facility];   Service: Endoscopy;  Laterality: N/A;  1:00pm, asa 3   LEFT HEART CATHETERIZATION WITH CORONARY ANGIOGRAM N/A 11/27/2011   Procedure: LEFT HEART CATHETERIZATION WITH CORONARY ANGIOGRAM;  Surgeon: Alm LELON Clay, MD;  Location: [facility] CATH LAB;  Service: Cardiovascular;  Laterality: N/A;   PERCUTANEOUS CORONARY STENT INTERVENTION (PCI-S)  1999   Mid RCA - NIR DMS 3.0 mm x 30 mm, dRCA Vision BMS 2.5 mm x 15 mm    RIGHT HEART CATH AND CORONARY ANGIOGRAPHY N/A 11/24/2022   Procedure: RIGHT HEART CATH AND CORONARY ANGIOGRAPHY;  Surgeon: Clay Alm LELON, MD;  Location: [facility] INVASIVE CV LAB;  Service: Cardiovascular;  Laterality: N/A;   TEE WITHOUT CARDIOVERSION N/A 01/27/2023   Procedure: TRANSESOPHAGEAL ECHOCARDIOGRAM;  Surgeon: Verlin Lonni BIRCH, MD;  Location: [facility] OR;  Service: Open Heart Surgery;  Laterality: N/A;   TRANSTHORACIC ECHOCARDIOGRAM  10/2018   EF 60-65%. Gr 2 DD.  No RWMA.  Mild-Mod AS  (mean gradient 19 mmHg). Mild-Mod PAH.   TRANSTHORACIC ECHOCARDIOGRAM  03/01/2020   EF 70 to 75%.  No or WMA.  Mild LVH.  GRII DD with severe LA enlargement.  Mildly elevated PAP.  Mild MR.  Moderate AS (mean gradient 17 mmHg) with dimensionless index of 0.32.   TUBAL LIGATION      Social History   Socioeconomic History   Marital status: Widowed    Spouse name: Gaither   Number of children: 3   Years of education: Not on file   Highest education level: Not on file  Occupational History   Occupation: Retired engineer, civil (consulting)  Tobacco Use   Smoking status: Former   Smokeless tobacco: Never   Tobacco comments:    Quit approximately 1993  Vaping Use   Vaping status: Never Used  Substance and Sexual Activity   Alcohol use: No   Drug use: No   Sexual activity: Not Currently    Birth control/protection: Post-menopausal  Other Topics Concern   Not on file  Social History Narrative   She is a married mother of 3, 2 daughters, 1 son who all live out of state but talks to weekly.   Step-son and his family live  next door. Sees often.    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    Quit smoking in '99, denies alcohol.   She volunteers for [employer].   Social Drivers of Corporate Investment Banker Strain: Low Risk  (06/24/2023)   Overall Financial Resource Strain (CARDIA)    Difficulty of Paying Living Expenses: Not hard at all  Food Insecurity: No Food Insecurity (06/24/2023)   Hunger Vital Sign    Worried About Running Out of Food in the Last Year: Never true    Ran Out of Food in the Last Year: Never true  Transportation Needs: No Transportation Needs (06/24/2023)   PRAPARE - Administrator, Civil Service (Medical): No    Lack of Transportation (Non-Medical): No  Physical Activity: Inactive (  06/24/2023)   Exercise Vital Sign    Days of Exercise per Week: 0 days    Minutes of Exercise per Session: 0 min  Stress: No Stress Concern Present (06/24/2023)   [employer] of Occupational Health - Occupational Stress Questionnaire    Feeling of Stress : Not at all  Social Connections: Moderately Isolated (06/24/2023)   Social Connection and Isolation Panel [NHANES]    Frequency of Communication with Friends and Family: More than three times a week    Frequency of Social Gatherings with Friends and Family: More than three times a week    Attends Religious Services: 1 to 4 times per year    Active Member of [employer] or Organizations: No    Attends Banker Meetings: Never    Marital Status: Widowed  Intimate Partner Violence: Not At Risk (06/24/2023)   Humiliation, Afraid, Rape, and Kick questionnaire    Fear of Current or Ex-Partner: No    Emotionally Abused: No    Physically Abused: No    Sexually Abused: No    Outpatient Encounter Medications as of 01/26/2024  Medication Sig   apixaban  (ELIQUIS ) 2.5 MG TABS tablet Take 1 tablet (2.5 mg total) by mouth 2 (two) times daily.   atorvastatin  (LIPITOR) 20 MG tablet TAKE ONE (1) TABLET EACH DAY   benazepril  (LOTENSIN ) 40  MG tablet Take 1 tablet (40 mg total) by mouth daily.   carvedilol  (COREG ) 25 MG tablet Take 1 tablet (25 mg total) by mouth 2 (two) times daily.   diphenhydramine -acetaminophen  (TYLENOL  PM) 25-500 MG TABS tablet Take 2 tablets by mouth at bedtime.   furosemide  (LASIX ) 40 MG tablet Take 1 tablet (40 mg total) by mouth daily as needed for fluid.   hydrALAZINE  (APRESOLINE ) 100 MG tablet TAKE ONE (1) TABLET BY MOUTH TWO (2) TIMES DAILY   Iron , Ferrous Sulfate , 325 (65 Fe) MG TABS Take 325 mg by mouth daily.   loperamide  (IMODIUM  A-D) 2 MG tablet Take 2 mg by mouth 4 (four) times daily as needed for diarrhea or loose stools.   NITROSTAT  0.4 MG SL tablet DISSOLVE 1 TAB UNDER TOUNGE FOR CHEST PAIN. MAY REPEAT EVERY 5 MINUTES FOR 3 DOSES. IF NO RELIEF CALL 911 OR GO TO ER   pantoprazole  (PROTONIX ) 40 MG tablet TAKE 1 TABLET BY MOUTH TWICE DAILY BEFORE MEALS   sertraline  (ZOLOFT ) 25 MG tablet TAKE ONE TABLET BY MOUTH AT BEDTIME   triamcinolone  cream (KENALOG ) 0.1 % Apply 1 Application topically 2 (two) times daily.   No facility-administered encounter medications on file as of 01/26/2024.    Allergies  Allergen Reactions   Valium Nausea And Vomiting    Review of Systems  Constitutional:  Negative for fatigue and fever.  HENT:  Negative for congestion, rhinorrhea and sore throat.   Eyes:  Negative for pain.  Respiratory:  Negative for cough and shortness of breath.   Gastrointestinal:  Negative for anorexia and vomiting.  Musculoskeletal:  Negative for joint pain.  Skin:  Positive for rash. Negative for nail changes.  All other systems reviewed and are negative.       Objective:  BP (!) 162/76   Pulse 83   Temp 97.9 F (36.6 C) (Temporal)   Ht 5' 5 (1.651 m)   Wt 135 lb 3.2 oz (61.3 kg)   SpO2 99%   BMI 22.50 kg/m    Wt Readings from Last 3 Encounters:  01/26/24 135 lb 3.2 oz (61.3 kg)  11/10/23 136 lb  12.8 oz (62.1 kg)  11/05/23 134 lb 6.4 oz (61 kg)    Physical Exam Vitals  and nursing note reviewed.  Constitutional:      Appearance: She is ill-appearing (chronically ill).  HENT:     Head: Normocephalic and atraumatic.     Mouth/Throat:     Mouth: Mucous membranes are moist.  Eyes:     Conjunctiva/sclera: Conjunctivae normal.     Pupils: Pupils are equal, round, and reactive to light.  Cardiovascular:     Rate and Rhythm: Normal rate.  Pulmonary:     Effort: Pulmonary effort is normal.  Skin:    General: Skin is warm and dry.     Capillary Refill: Capillary refill takes less than 2 seconds.     Findings: Erythema and rash present.       Neurological:     General: No focal deficit present.     Mental Status: She is alert and oriented to person, place, and time.     Gait: Gait abnormal (using rollator).  Psychiatric:        Mood and Affect: Mood normal.        Thought Content: Thought content normal.        Judgment: Judgment normal.     Results for orders placed or performed in visit on 11/12/23  CMP14+EGFR   Collection Time: 11/12/23 12:58 PM  Result Value Ref Range   Glucose 110 (H) 70 - 99 mg/dL   BUN 15 8 - 27 mg/dL   Creatinine, Ser 9.19 0.57 - 1.00 mg/dL   eGFR 72 >40 fO/fpw/8.26   BUN/Creatinine Ratio 19 12 - 28   Sodium 140 134 - 144 mmol/L   Potassium 4.5 3.5 - 5.2 mmol/L   Chloride 103 96 - 106 mmol/L   CO2 24 20 - 29 mmol/L   Calcium  9.6 8.7 - 10.3 mg/dL   Total Protein 7.5 6.0 - 8.5 g/dL   Albumin 4.3 3.7 - 4.7 g/dL   Globulin, Total 3.2 1.5 - 4.5 g/dL   Bilirubin Total 0.5 0.0 - 1.2 mg/dL   Alkaline Phosphatase 89 44 - 121 IU/L   AST 13 0 - 40 IU/L   ALT 7 0 - 32 IU/L  Anemia Profile B   Collection Time: 11/12/23 12:58 PM  Result Value Ref Range   Total Iron  Binding Capacity 290 250 - 450 ug/dL   UIBC 745 881 - 630 ug/dL   Iron  36 27 - 139 ug/dL   Iron  Saturation 12 (L) 15 - 55 %   Ferritin 63 15 - 150 ng/mL   Vitamin B-12 447 232 - 1,245 pg/mL   Folate 17.2 >3.0 ng/mL   WBC 5.5 3.4 - 10.8 x10E3/uL   RBC 3.31  (L) 3.77 - 5.28 x10E6/uL   Hemoglobin 9.4 (L) 11.1 - 15.9 g/dL   Hematocrit 69.2 (L) 65.9 - 46.6 %   MCV 93 79 - 97 fL   MCH 28.4 26.6 - 33.0 pg   MCHC 30.6 (L) 31.5 - 35.7 g/dL   RDW 85.4 88.2 - 84.5 %   Platelets 259 150 - 450 x10E3/uL   Neutrophils 73 Not Estab. %   Lymphs 15 Not Estab. %   Monocytes 8 Not Estab. %   Eos 3 Not Estab. %   Basos 1 Not Estab. %   Neutrophils Absolute 4.0 1.4 - 7.0 x10E3/uL   Lymphocytes Absolute 0.8 0.7 - 3.1 x10E3/uL   Monocytes Absolute 0.4 0.1 - 0.9 x10E3/uL   EOS (  ABSOLUTE) 0.2 0.0 - 0.4 x10E3/uL   Basophils Absolute 0.0 0.0 - 0.2 x10E3/uL   Immature Granulocytes 0 Not Estab. %   Immature Grans (Abs) 0.0 0.0 - 0.1 x10E3/uL   Retic Ct Pct 1.7 0.6 - 2.6 %       Pertinent labs & imaging results that were available during my care of the patient were reviewed by me and considered in my medical decision making.  Assessment & Plan:  Kody was seen today for rash.  Diagnoses and all orders for this visit:  Erythematous rash Blistering rash Rash concerning for shingles. Has been more than 5 days since onset. Will treat with below. Aware to report new, worsening, or persistent symptoms.  -     triamcinolone  cream (KENALOG ) 0.1 %; Apply 1 Application topically 2 (two) times daily.     Continue all other maintenance medications.  Follow up plan: Return if symptoms worsen or fail to improve.   Continue healthy lifestyle choices, including diet (rich in fruits, vegetables, and lean proteins, and low in salt and simple carbohydrates) and exercise (at least 30 minutes of moderate physical activity daily).   The above assessment and management plan was discussed with the patient. The patient verbalized understanding of and has agreed to the management plan. Patient is aware to call the clinic if they develop any new symptoms or if symptoms persist or worsen. Patient is aware when to return to the clinic for a follow-up visit. Patient educated on  when it is appropriate to go to the emergency department.   Rosaline Bruns, FNP-C [facility] [phone]

## 2024-01-28 ENCOUNTER — Other Ambulatory Visit: Payer: Self-pay | Admitting: Family Medicine

## 2024-01-28 DIAGNOSIS — F339 Major depressive disorder, recurrent, unspecified: Secondary | ICD-10-CM

## 2024-02-03 ENCOUNTER — Ambulatory Visit (HOSPITAL_COMMUNITY): Payer: Medicare Other | Attending: Family Medicine

## 2024-02-03 ENCOUNTER — Ambulatory Visit: Payer: Medicare Other

## 2024-02-04 ENCOUNTER — Other Ambulatory Visit: Payer: Self-pay | Admitting: Physician Assistant

## 2024-02-04 ENCOUNTER — Telehealth (HOSPITAL_COMMUNITY): Payer: Self-pay | Admitting: Cardiology

## 2024-02-04 ENCOUNTER — Encounter: Payer: Self-pay | Admitting: Physician Assistant

## 2024-02-04 DIAGNOSIS — Z952 Presence of prosthetic heart valve: Secondary | ICD-10-CM

## 2024-02-04 NOTE — Telephone Encounter (Signed)
Patient was not a NO SHOW for her echocardiogram on 02/03/24. Patient left a voicemail in the Imaging Department.

## 2024-02-05 ENCOUNTER — Ambulatory Visit (INDEPENDENT_AMBULATORY_CARE_PROVIDER_SITE_OTHER): Payer: Medicare Other | Admitting: Family Medicine

## 2024-02-05 ENCOUNTER — Encounter: Payer: Self-pay | Admitting: Family Medicine

## 2024-02-05 VITALS — BP 183/75 | HR 84 | Temp 96.9°F | Ht 65.0 in | Wt 133.6 lb

## 2024-02-05 DIAGNOSIS — E785 Hyperlipidemia, unspecified: Secondary | ICD-10-CM | POA: Diagnosis not present

## 2024-02-05 DIAGNOSIS — E1159 Type 2 diabetes mellitus with other circulatory complications: Secondary | ICD-10-CM

## 2024-02-05 DIAGNOSIS — E1169 Type 2 diabetes mellitus with other specified complication: Secondary | ICD-10-CM | POA: Diagnosis not present

## 2024-02-05 DIAGNOSIS — I5032 Chronic diastolic (congestive) heart failure: Secondary | ICD-10-CM

## 2024-02-05 DIAGNOSIS — E559 Vitamin D deficiency, unspecified: Secondary | ICD-10-CM

## 2024-02-05 DIAGNOSIS — R21 Rash and other nonspecific skin eruption: Secondary | ICD-10-CM | POA: Diagnosis not present

## 2024-02-05 DIAGNOSIS — I110 Hypertensive heart disease with heart failure: Secondary | ICD-10-CM

## 2024-02-05 DIAGNOSIS — I4819 Other persistent atrial fibrillation: Secondary | ICD-10-CM

## 2024-02-05 DIAGNOSIS — E119 Type 2 diabetes mellitus without complications: Secondary | ICD-10-CM | POA: Diagnosis not present

## 2024-02-05 DIAGNOSIS — Z952 Presence of prosthetic heart valve: Secondary | ICD-10-CM

## 2024-02-05 DIAGNOSIS — I152 Hypertension secondary to endocrine disorders: Secondary | ICD-10-CM

## 2024-02-05 MED ORDER — TRIAMCINOLONE ACETONIDE 0.1 % EX CREA
1.0000 | TOPICAL_CREAM | Freq: Two times a day (BID) | CUTANEOUS | 0 refills | Status: DC
Start: 2024-02-05 — End: 2024-02-18

## 2024-02-05 NOTE — Progress Notes (Signed)
Subjective:  Patient ID: [name], female    DOB: 11/13/1936, 88 y.o.   MRN: [id number]  Patient Care Team: Sonny Masters, FNP as PCP - General (Family Medicine) Marykay Lex, MD as PCP - Cardiology (Cardiology) Michaelle Copas, MD as Referring Physician (Optometry)   Chief Complaint:  Diabetes (3 month follow up )   HPI: [name] is a 88 y.o. female presenting on 02/05/2024 for Diabetes (3 month follow up )   Discussed the use of AI scribe software for clinical note transcription with the patient, who gave verbal consent to proceed.  History of Present Illness   [name] is an 88 year old female with type 2 diabetes and atrial fibrillation who presents for a follow-up visit. She is accompanied by her daughter, Jasmine December.  She has type 2 diabetes, currently managed through diet. She has not been routinely checking her blood glucose levels. Her last hemoglobin A1c in November was 5.6, which is within the normal range, although she has had levels above 6.5 in the past, confirming her diagnosis of type 2 diabetes. She is not on any diabetes medication.  Her atrial fibrillation is stable and well-controlled. She was scheduled for a 2D echocardiogram earlier this week but had to reschedule due to inclement weather. She has an upcoming appointment for the echocardiogram next week. She recalls seeing a physician assistant in her cardiologist's office but has not seen her cardiologist in a while.  She recently experienced an episode of shingles on her buttocks, diagnosed last week. Her daughter, Jasmine December, has been applying triamcinolone cream twice daily, resulting in significant improvement. Initially, there were blisters and redness, but these have since healed considerably.  She has a history of iron deficiency and had temporarily stopped taking her iron supplements due to black stools, which resolved upon cessation. She has since resumed her iron supplementation.           Relevant past medical, surgical, family, and social history reviewed and updated as indicated.  Allergies and medications reviewed and updated. Data reviewed: Chart in Epic.   Past Medical History:  Diagnosis Date   A-fib [facility])    Anxiety    CAD S/P percutaneous coronary angioplasty 1990s, 11/28/2011    a) midRCA PCI 1999 (NIR BMS 3.0 mm x 32 mm); distal  RCA BMS Sept 2012 (Vision BMS 2.5 mm x 15 mm); 12/02: RCA ISR --> Cutting PTCA    Coronary stent restenosis due to progression of disease 11/30/2011   Dyslipidemia, goal LDL below 70 11/28/2011   Essential hypertension    On ACE inhibitor and ARB? As well as atenolol and hydralazine   Gastric ulcer 11/2018   GERD (gastroesophageal reflux disease)    History of blood transfusion    without reaction   Moderate aortic stenosis by prior echocardiogram 08/2011; 08/2013   Echo 11/09/18: EF 60-65%. Gr 2 DD.  No RWMA.  Mild-Mod AS (mean gradient 19 mmHg). Mild-Mod PAH. 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 Clifton James and Dr. Laneta Simmers   Type 2 diabetes  mellitus with complication, without long-term current use of insulin (HCC)    no longer taking diabetic medication - diet controlled    Past Surgical History:  Procedure Laterality Date   ABDOMINAL HYSTERECTOMY     BIOPSY  06/03/2018   Procedure: BIOPSY;  Surgeon: Malissa Hippo, MD;  Location: [facility];  Service: Endoscopy;;  gastric    BIOPSY  09/15/2022   Procedure: BIOPSY;  Surgeon: Lanelle Bal, DO;  Location: [facility];  Service: Endoscopy;;   BREAST BIOPSY     CARDIAC  CATHETERIZATION  2012   performed for angina   CARDIAC CATHETERIZATION     CARDIOVERSION N/A 10/24/2020   Procedure: CARDIOVERSION;  Surgeon: Jake Bathe, MD;  Location: [facility];  Service: Cardiovascular;  Laterality: N/A;   CORONARY ANGIOPLASTY  11/2006   Cutting Balloon of stent in the RCA   ESOPHAGOGASTRODUODENOSCOPY N/A 06/03/2018   Procedure: ESOPHAGOGASTRODUODENOSCOPY (EGD);  Surgeon: Malissa Hippo, MD;  Location: [facility];  Service: Endoscopy;  Laterality: N/A;  1:40   ESOPHAGOGASTRODUODENOSCOPY N/A 12/02/2018   Procedure: ESOPHAGOGASTRODUODENOSCOPY (EGD);  Surgeon: Malissa Hippo, MD;  [facility]: [facility];  Service: Endoscopy;  Laterality: N/A;  255   ESOPHAGOGASTRODUODENOSCOPY (EGD) WITH PROPOFOL N/A 09/15/2022   Procedure: ESOPHAGOGASTRODUODENOSCOPY (EGD) WITH PROPOFOL;  Surgeon: Lanelle Bal, DO;  Location: [facility];  Service: Endoscopy;  Laterality: N/A;  1:00pm, asa 3   LEFT HEART CATHETERIZATION WITH CORONARY ANGIOGRAM N/A 11/27/2011   Procedure: LEFT HEART CATHETERIZATION WITH CORONARY ANGIOGRAM;  Surgeon: Marykay Lex, MD;  Location: [facility] CATH LAB;  Service: Cardiovascular;  Laterality: N/A;   PERCUTANEOUS CORONARY STENT INTERVENTION (PCI-S)  1999   Mid RCA - NIR DMS 3.0 mm x 30 mm, dRCA Vision BMS 2.5 mm x 15 mm    RIGHT HEART CATH AND CORONARY ANGIOGRAPHY N/A 11/24/2022   Procedure: RIGHT HEART CATH AND CORONARY ANGIOGRAPHY;  Surgeon: Marykay Lex, MD;  Location: [facility] INVASIVE CV LAB;  Service: Cardiovascular;  Laterality: N/A;   TEE WITHOUT CARDIOVERSION N/A 01/27/2023   Procedure: TRANSESOPHAGEAL ECHOCARDIOGRAM;  Surgeon: Kathleene Hazel, MD;  Location: [facility] OR;  Service: Open Heart Surgery;  Laterality: N/A;   TRANSTHORACIC ECHOCARDIOGRAM  10/2018   EF 60-65%. Gr 2 DD.  No RWMA.  Mild-Mod AS  (mean gradient 19 mmHg). Mild-Mod PAH.   TRANSTHORACIC ECHOCARDIOGRAM  03/01/2020   EF 70 to 75%.  No or WMA.  Mild LVH.  GRII DD with severe LA  enlargement.  Mildly elevated PAP.  Mild MR.  Moderate AS (mean gradient 17 mmHg) with dimensionless index of 0.32.   TUBAL LIGATION      Social History   Socioeconomic History   Marital status: Widowed    Spouse name: Channing Mutters   Number of children: 3   Years of education: Not on file   Highest education level: Not on file  Occupational History   Occupation: Retired Engineer, civil (consulting)  Tobacco Use   Smoking status: Former   Smokeless tobacco: Never   Tobacco comments:    Quit approximately 1993  Vaping Use   Vaping status: Never Used  Substance and Sexual Activity   Alcohol use: No   Drug use: No   Sexual activity: Not Currently    Birth control/protection: Post-menopausal  Other Topics Concern   Not on file  Social History Narrative   She is a married mother of 3, 2 daughters, 1 son who all live out of state but talks to weekly.  Step-son and his family live next door. Sees often.    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    Quit smoking in '99, denies alcohol.   She volunteers for [employer].   Social Drivers of Corporate investment banker Strain: Low Risk  (06/24/2023)   Overall Financial Resource Strain (CARDIA)    Difficulty of Paying Living Expenses: Not hard at all  Food Insecurity: No Food Insecurity (06/24/2023)   Hunger Vital Sign    Worried About Running Out of Food in the Last Year: Never true    Ran Out of Food in the Last Year: Never true  Transportation Needs: No Transportation Needs (06/24/2023)   PRAPARE - Administrator, Civil Service (Medical): No    Lack of Transportation (Non-Medical): No  Physical Activity: Inactive (06/24/2023)   Exercise Vital Sign    Days of Exercise per Week: 0 days    Minutes of Exercise per Session: 0 min  Stress: No Stress Concern Present (06/24/2023)   [employer] of Occupational Health - Occupational Stress Questionnaire    Feeling of Stress : Not at all  Social Connections: Moderately Isolated (06/24/2023)    Social Connection and Isolation Panel [NHANES]    Frequency of Communication with Friends and Family: More than three times a week    Frequency of Social Gatherings with Friends and Family: More than three times a week    Attends Religious Services: 1 to 4 times per year    Active Member of [employer] or Organizations: No    Attends Banker Meetings: Never    Marital Status: Widowed  Intimate Partner Violence: Not At Risk (06/24/2023)   Humiliation, Afraid, Rape, and Kick questionnaire    Fear of Current or Ex-Partner: No    Emotionally Abused: No    Physically Abused: No    Sexually Abused: No    Outpatient Encounter Medications as of 02/05/2024  Medication Sig   apixaban (ELIQUIS) 2.5 MG TABS tablet Take 1 tablet (2.5 mg total) by mouth 2 (two) times daily.   atorvastatin (LIPITOR) 20 MG tablet TAKE ONE (1) TABLET EACH DAY   benazepril (LOTENSIN) 40 MG tablet Take 1 tablet (40 mg total) by mouth daily.   carvedilol (COREG) 25 MG tablet Take 1 tablet (25 mg total) by mouth 2 (two) times daily.   diphenhydramine-acetaminophen (TYLENOL PM) 25-500 MG TABS tablet Take 2 tablets by mouth at bedtime.   furosemide (LASIX) 40 MG tablet Take 1 tablet (40 mg total) by mouth daily as needed for fluid.   hydrALAZINE (APRESOLINE) 100 MG tablet TAKE ONE (1) TABLET BY MOUTH TWO (2) TIMES DAILY   Iron, Ferrous Sulfate, 325 (65 Fe) MG TABS Take 325 mg by mouth daily.   loperamide (IMODIUM A-D) 2 MG tablet Take 2 mg by mouth 4 (four) times daily as needed for diarrhea or loose stools.   NITROSTAT 0.4 MG SL tablet DISSOLVE 1 TAB UNDER TOUNGE FOR CHEST PAIN. MAY REPEAT EVERY 5 MINUTES FOR 3 DOSES. IF NO RELIEF CALL 911 OR GO TO ER   pantoprazole (PROTONIX) 40 MG tablet TAKE 1 TABLET BY MOUTH TWICE DAILY BEFORE MEALS   sertraline (ZOLOFT) 25 MG tablet TAKE ONE TABLET BY MOUTH AT BEDTIME   [DISCONTINUED] triamcinolone cream (KENALOG) 0.1 % Apply 1 Application topically 2 (two) times daily.    triamcinolone cream (KENALOG) 0.1 % Apply 1 Application topically 2 (two) times daily.   No facility-administered encounter medications on  file as of 02/05/2024.    Allergies  Allergen Reactions   Valium Nausea And Vomiting    Pertinent ROS per HPI, otherwise unremarkable      Objective:  BP (!) 183/75   Pulse 84   Temp (!) 96.9 F (36.1 C)   Ht 5\' 5"  (1.651 m)   Wt 133 lb 9.6 oz (60.6 kg)   SpO2 97%   BMI 22.23 kg/m    Wt Readings from Last 3 Encounters:  02/05/24 133 lb 9.6 oz (60.6 kg)  01/26/24 135 lb 3.2 oz (61.3 kg)  11/10/23 136 lb 12.8 oz (62.1 kg)    Physical Exam Vitals and nursing note reviewed.  Constitutional:      General: She is not in acute distress.    Appearance: Normal appearance. She is normal weight. She is ill-appearing (chronically ill). She is not toxic-appearing or diaphoretic.  HENT:     Head: Normocephalic and atraumatic.     Nose: Nose normal.     Mouth/Throat:     Mouth: Mucous membranes are moist.     Pharynx: Oropharynx is clear.  Eyes:     Conjunctiva/sclera: Conjunctivae normal.     Pupils: Pupils are equal, round, and reactive to light.  Cardiovascular:     Rate and Rhythm: Normal rate. Rhythm irregularly irregular.     Heart sounds: Murmur heard.     Systolic murmur is present with a grade of 1/6.     Comments: Crisp valve click Pulmonary:     Effort: Pulmonary effort is normal.     Breath sounds: Normal breath sounds.  Musculoskeletal:     Right lower leg: No edema.     Left lower leg: No edema.  Skin:    General: Skin is warm and dry.     Capillary Refill: Capillary refill takes less than 2 seconds.     Findings: Rash (healing rash to right buttock) present.  Neurological:     General: No focal deficit present.     Mental Status: She is alert and oriented to person, place, and time.  Psychiatric:        Mood and Affect: Mood normal.        Behavior: Behavior normal.        Thought Content: Thought content normal.         Judgment: Judgment normal.    Physical Exam   SKIN: Skin healing, previously blistered and red.        Results for orders placed or performed in visit on 11/12/23  CMP14+EGFR   Collection Time: 11/12/23 12:58 PM  Result Value Ref Range   Glucose 110 (H) 70 - 99 mg/dL   BUN 15 8 - 27 mg/dL   Creatinine, Ser 1.61 0.57 - 1.00 mg/dL   eGFR 72 >09 UE/AVW/0.98   BUN/Creatinine Ratio 19 12 - 28   Sodium 140 134 - 144 mmol/L   Potassium 4.5 3.5 - 5.2 mmol/L   Chloride 103 96 - 106 mmol/L   CO2 24 20 - 29 mmol/L   Calcium 9.6 8.7 - 10.3 mg/dL   Total Protein 7.5 6.0 - 8.5 g/dL   Albumin 4.3 3.7 - 4.7 g/dL   Globulin, Total 3.2 1.5 - 4.5 g/dL   Bilirubin Total 0.5 0.0 - 1.2 mg/dL   Alkaline Phosphatase 89 44 - 121 IU/L   AST 13 0 - 40 IU/L   ALT 7 0 - 32 IU/L  Anemia Profile B   Collection Time: 11/12/23 12:58 PM  Result Value Ref Range   Total Iron Binding Capacity 290 250 - 450 ug/dL   UIBC 045 409 - 811 ug/dL   Iron 36 27 - 914 ug/dL   Iron Saturation 12 (L) 15 - 55 %   Ferritin 63 15 - 150 ng/mL   Vitamin B-12 447 232 - 1,245 pg/mL   Folate 17.2 >3.0 ng/mL   WBC 5.5 3.4 - 10.8 x10E3/uL   RBC 3.31 (L) 3.77 - 5.28 x10E6/uL   Hemoglobin 9.4 (L) 11.1 - 15.9 g/dL   Hematocrit 78.2 (L) 95.6 - 46.6 %   MCV 93 79 - 97 fL   MCH 28.4 26.6 - 33.0 pg   MCHC 30.6 (L) 31.5 - 35.7 g/dL   RDW 21.3 08.6 - 57.8 %   Platelets 259 150 - 450 x10E3/uL   Neutrophils 73 Not Estab. %   Lymphs 15 Not Estab. %   Monocytes 8 Not Estab. %   Eos 3 Not Estab. %   Basos 1 Not Estab. %   Neutrophils Absolute 4.0 1.4 - 7.0 x10E3/uL   Lymphocytes Absolute 0.8 0.7 - 3.1 x10E3/uL   Monocytes Absolute 0.4 0.1 - 0.9 x10E3/uL   EOS (ABSOLUTE) 0.2 0.0 - 0.4 x10E3/uL   Basophils Absolute 0.0 0.0 - 0.2 x10E3/uL   Immature Granulocytes 0 Not Estab. %   Immature Grans (Abs) 0.0 0.0 - 0.1 x10E3/uL   Retic Ct Pct 1.7 0.6 - 2.6 %       Pertinent labs & imaging results that were available during my  care of the patient were reviewed by me and considered in my medical decision making.  Assessment & Plan:  [place] was seen today for diabetes.  Diagnoses and all orders for this visit:  Diet-controlled diabetes mellitus (HCC) -     Anemia Profile B -     CMP14+EGFR -     Lipid panel -     Thyroid Panel With TSH -     Hemoglobin A1c  Hyperlipidemia associated with type 2 diabetes mellitus (HCC) -     CMP14+EGFR -     Lipid panel  Hypertension associated with diabetes (HCC) -     CMP14+EGFR -     Lipid panel -     Thyroid Panel With TSH -     Hemoglobin A1c  S/P TAVR (transcatheter aortic valve replacement)  Persistent atrial fibrillation (HCC) -     Anemia Profile B -     CMP14+EGFR -     Lipid panel -     Magnesium  Hypomagnesemia -     Magnesium  Hypertensive heart disease with chronic diastolic congestive heart failure (HCC) -     Anemia Profile B -     CMP14+EGFR -     Lipid panel -     Thyroid Panel With TSH  Erythematous rash -     triamcinolone cream (KENALOG) 0.1 %; Apply 1 Application topically 2 (two) times daily.  Blistering rash -     triamcinolone cream (KENALOG) 0.1 %; Apply 1 Application topically 2 (two) times daily.  Vitamin D deficiency -     CMP14+EGFR -     VITAMIN D 25 Hydroxy (Vit-D Deficiency, Fractures)     Assessment and Plan    Type 2 Diabetes Mellitus Type 2 diabetes mellitus, currently diet-controlled. Last A1c in November was 5.6. Blood sugars typically range from 105 to 120. Discussed the importance of periodic A1c monitoring to prevent complications such as heart attack or stroke. -  Order A1c test - Plan for A1c testing every 6-8 months unless symptoms arise  Atrial Fibrillation Atrial fibrillation, well-controlled. No significant palpitations reported. Upcoming 2D echo scheduled for next Thursday. Follow-up with cardiology for results and further management. - Proceed with 2D echo - Follow up with cardiology for results  and further management  Shingles Shingles on the buttocks, currently healing well with significant improvement. Reports stinging and burning sensations. Discussed the use of A&D ointment mixed with triamcinolone cream to provide a skin barrier and aid healing. - Send in prescription for triamcinolone cream - Recommend mixing triamcinolone cream with A&D ointment for application  Iron Deficiency Anemia Iron deficiency anemia, previously noted with low iron levels. Stopped iron supplementation due to black tarry stools but resumed after stools normalized. Iron levels are slowly improving. - Continue current iron supplementation  General Health Maintenance General health maintenance discussed. - Continue current medications and health practices  Follow-up - Follow up with cardiology after 2D echo for results - Plan for A1c testing every 6-8 months unless symptoms arise.          Continue all other maintenance medications.  Follow up plan: Return in about 6 months (around 08/04/2024) for chronic follow up.   Continue healthy lifestyle choices, including diet (rich in fruits, vegetables, and lean proteins, and low in salt and simple carbohydrates) and exercise (at least 30 minutes of moderate physical activity daily).  Educational handout given for health maintenance   The above assessment and management plan was discussed with the patient. The patient verbalized understanding of and has agreed to the management plan. Patient is aware to call the clinic if they develop any new symptoms or if symptoms persist or worsen. Patient is aware when to return to the clinic for a follow-up visit. Patient educated on when it is appropriate to go to the emergency department.   Kari Baars, FNP-C [facility] [phone]

## 2024-02-06 LAB — ANEMIA PROFILE B
Basophils Absolute: 0 10*3/uL (ref 0.0–0.2)
Basos: 1 %
EOS (ABSOLUTE): 0.2 10*3/uL (ref 0.0–0.4)
Eos: 3 %
Ferritin: 70 ng/mL (ref 15–150)
Folate: >20 ng/mL (ref >3.0)
Hematocrit: 30.8 % — ABNORMAL LOW (ref 34.0–46.6)
Hemoglobin: 9.6 g/dL — ABNORMAL LOW (ref 11.1–15.9)
Immature Grans (Abs): 0.1 10*3/uL (ref 0.0–0.1)
Immature Granulocytes: 1 %
Iron Saturation: 14 % — ABNORMAL LOW (ref 15–55)
Iron: 40 ug/dL (ref 27–139)
Lymphocytes Absolute: 0.9 10*3/uL (ref 0.7–3.1)
Lymphs: 17 %
MCH: 27.8 pg (ref 26.6–33.0)
MCHC: 31.2 g/dL — ABNORMAL LOW (ref 31.5–35.7)
MCV: 89 fL (ref 79–97)
Monocytes Absolute: 0.4 10*3/uL (ref 0.1–0.9)
Monocytes: 9 %
Neutrophils Absolute: 3.6 10*3/uL (ref 1.4–7.0)
Neutrophils: 69 %
Platelets: 245 10*3/uL (ref 150–450)
RBC: 3.45 x10E6/uL — ABNORMAL LOW (ref 3.77–5.28)
RDW: 15.1 % (ref 11.7–15.4)
Retic Ct Pct: 1.9 % (ref 0.6–2.6)
Total Iron Binding Capacity: 290 ug/dL (ref 250–450)
UIBC: 250 ug/dL (ref 118–369)
Vitamin B-12: 452 pg/mL (ref 232–1245)
WBC: 5.1 10*3/uL (ref 3.4–10.8)

## 2024-02-06 LAB — VITAMIN D 25 HYDROXY (VIT D DEFICIENCY, FRACTURES): Vit D, 25-Hydroxy: 114 ng/mL — ABNORMAL HIGH (ref 30.0–100.0)

## 2024-02-06 LAB — LIPID PANEL
Chol/HDL Ratio: 2.5 {ratio} (ref 0.0–4.4)
Cholesterol, Total: 111 mg/dL (ref 100–199)
HDL: 44 mg/dL (ref >39)
LDL Chol Calc (NIH): 52 mg/dL (ref 0–99)
Triglycerides: 72 mg/dL (ref 0–149)
VLDL Cholesterol Cal: 15 mg/dL (ref 5–40)

## 2024-02-06 LAB — CMP14+EGFR
ALT: 10 [IU]/L (ref 0–32)
AST: 16 [IU]/L (ref 0–40)
Albumin: 4.4 g/dL (ref 3.7–4.7)
Alkaline Phosphatase: 91 [IU]/L (ref 44–121)
BUN/Creatinine Ratio: 23 (ref 12–28)
BUN: 18 mg/dL (ref 8–27)
Bilirubin Total: 0.4 mg/dL (ref 0.0–1.2)
CO2: 25 mmol/L (ref 20–29)
Calcium: 9.8 mg/dL (ref 8.7–10.3)
Chloride: 102 mmol/L (ref 96–106)
Creatinine, Ser: 0.77 mg/dL (ref 0.57–1.00)
Globulin, Total: 3.1 g/dL (ref 1.5–4.5)
Glucose: 100 mg/dL — ABNORMAL HIGH (ref 70–99)
Potassium: 4.1 mmol/L (ref 3.5–5.2)
Sodium: 143 mmol/L (ref 134–144)
Total Protein: 7.5 g/dL (ref 6.0–8.5)
eGFR: 75 mL/min/{1.73_m2} (ref >59)

## 2024-02-06 LAB — MAGNESIUM: Magnesium: 2 mg/dL (ref 1.6–2.3)

## 2024-02-06 LAB — HEMOGLOBIN A1C
Est. average glucose Bld gHb Est-mCnc: 117 mg/dL
Hgb A1c MFr Bld: 5.7 % — ABNORMAL HIGH (ref 4.8–5.6)

## 2024-02-06 LAB — THYROID PANEL WITH TSH
Free Thyroxine Index: 3.1 (ref 1.2–4.9)
T3 Uptake Ratio: 29 % (ref 24–39)
T4, Total: 10.6 ug/dL (ref 4.5–12.0)
TSH: 1.9 u[IU]/mL (ref 0.450–4.500)

## 2024-02-11 ENCOUNTER — Ambulatory Visit (HOSPITAL_COMMUNITY): Payer: Medicare Other

## 2024-02-15 ENCOUNTER — Telehealth (INDEPENDENT_AMBULATORY_CARE_PROVIDER_SITE_OTHER): Payer: Self-pay | Admitting: Cardiology

## 2024-02-15 ENCOUNTER — Telehealth: Payer: Self-pay | Admitting: Cardiology

## 2024-02-15 ENCOUNTER — Ambulatory Visit: Payer: Medicare Other | Attending: Adult Health

## 2024-02-15 NOTE — Telephone Encounter (Signed)
 Patient was scheduled for TAVR follow up with myself today however wished to reschedule for after echocardiogram performed 02/29/24 when called by prep assistant. Attempted to call the patient twice during clinic today to reschedule for an in person office visit with Carlean Jews, PA with no answer on both calls.   Georgie Chard NP-C Structural Heart Team  Pager: [phone] Phone: [phone]

## 2024-02-16 NOTE — Telephone Encounter (Signed)
 Structural Heart team will follow up with the patient after 3/10 echo complete.

## 2024-02-17 NOTE — Telephone Encounter (Signed)
 Error in charting. Patient did not show up for appointment

## 2024-02-18 ENCOUNTER — Other Ambulatory Visit: Payer: Self-pay | Admitting: Family Medicine

## 2024-02-18 DIAGNOSIS — R21 Rash and other nonspecific skin eruption: Secondary | ICD-10-CM

## 2024-02-19 ENCOUNTER — Encounter: Payer: Self-pay | Admitting: Gastroenterology

## 2024-02-29 ENCOUNTER — Ambulatory Visit (HOSPITAL_COMMUNITY)
Admission: RE | Admit: 2024-02-29 | Discharge: 2024-02-29 | Disposition: A | Discharge status: Home / Self Care | Payer: Medicare Other | Source: Ambulatory Visit | Attending: Cardiology | Admitting: Cardiology

## 2024-02-29 ENCOUNTER — Telehealth: Payer: Self-pay | Admitting: Physician Assistant

## 2024-02-29 ENCOUNTER — Other Ambulatory Visit: Payer: Self-pay | Admitting: Family Medicine

## 2024-02-29 DIAGNOSIS — Z952 Presence of prosthetic heart valve: Secondary | ICD-10-CM | POA: Insufficient documentation

## 2024-02-29 LAB — ECHOCARDIOGRAM COMPLETE
AV Mean grad: 11 mmHg
AV Peak grad: 19.9 mmHg
Ao pk vel: 2.23 m/s
Area-P 1/2: 3.65 cm2
MV M vel: 5.08 m/s
MV Peak grad: 103.2 mmHg
Radius: 0.6 cm
S' Lateral: 2.1 cm

## 2024-02-29 NOTE — Progress Notes (Signed)
*  PRELIMINARY RESULTS* Echocardiogram 2D Echocardiogram has been performed.  Stacey Drain 02/29/2024, 10:27 AM

## 2024-02-29 NOTE — Telephone Encounter (Signed)
  [facility]   MULTIDISCIPLINARY HEART VALVE TEAM  Pt was due for her 1 year office visit and echo s/p TAVR. She missed her echo/OV on 02/03/24 and then was r/s for an echo and VV. She then refused to participate in the virtual visit.   Echo was completed on 02/29/24 and showed EF 60%, mild LVH, moderately elevated pulmonary pressure, mild RVE, mild MR, mod-severe TR and normally functioning TAVR with a mean gradient of 11 mmHg and no PVL.   She reports NYHA class II symptoms, mostly of fatigue which she relates to her age. She is also quite limited by mobility issues but not SOB.   [place] Cardiomyopathy Questionnaire     02/29/2024    1:32 PM 03/04/2023    2:06 PM 01/23/2023    1:00 AM  KCCQ-12  1 a. Ability to shower/bathe Not at all limited Not at all limited Quite a bit limited  1 b. Ability to walk 1 block Slightly limited Slightly limited Not at all limited  1 c. Ability to hurry/jog Other, Did not do Extremely limited Not at all limited  2. Edema feet/ankles/legs Less than once a week Never over the past 2 weeks 3+ times a week, not every day  3. Limited by fatigue All of the time 1-2 times a week All of the time  4. Limited by dyspnea Never over the past 2 weeks Never over the past 2 weeks All of the time  5. Sitting up / on 3+ pillows Never over the past 2 weeks Never over the past 2 weeks Never over the past 2 weeks  6. Limited enjoyment of life Slightly limited Slightly limited Extremely limited  7. Rest of life w/ symptoms Mostly satisfied Mostly satisfied Not at all satisfied  8 a. Participation in hobbies Slightly limited Moderately limited Severely limited  8 b. Participation in chores Slightly limited Moderately limited Severely limited  8 c. Visiting family/friends Slightly limited N/A, did not do for other reasons Severely limited    Cline Crock PA-C  [facility]

## 2024-03-01 ENCOUNTER — Other Ambulatory Visit: Payer: Self-pay

## 2024-03-01 MED ORDER — APIXABAN 2.5 MG PO TABS: 2.5000 mg | ORAL_TABLET | Freq: Two times a day (BID) | ORAL | 3 refills | Status: DC

## 2024-03-01 NOTE — Telephone Encounter (Signed)
 Prescription refill request for Eliquis received. Indication:tavr Last office visit:3/24 Scr:0.77  2/25 Age: 88 Weight:60.6  kg  Prescription refilled

## 2024-03-17 DIAGNOSIS — M79676 Pain in unspecified toe(s): Secondary | ICD-10-CM | POA: Diagnosis not present

## 2024-03-17 DIAGNOSIS — L84 Corns and callosities: Secondary | ICD-10-CM | POA: Diagnosis not present

## 2024-03-17 DIAGNOSIS — E1142 Type 2 diabetes mellitus with diabetic polyneuropathy: Secondary | ICD-10-CM | POA: Diagnosis not present

## 2024-03-17 DIAGNOSIS — B351 Tinea unguium: Secondary | ICD-10-CM | POA: Diagnosis not present

## 2024-03-23 ENCOUNTER — Other Ambulatory Visit: Payer: Self-pay | Admitting: Family Medicine

## 2024-03-23 DIAGNOSIS — I110 Hypertensive heart disease with heart failure: Secondary | ICD-10-CM

## 2024-03-23 DIAGNOSIS — I152 Hypertension secondary to endocrine disorders: Secondary | ICD-10-CM

## 2024-04-25 ENCOUNTER — Ambulatory Visit: Attending: Cardiology | Admitting: Cardiology

## 2024-04-25 ENCOUNTER — Encounter: Payer: Self-pay | Admitting: Cardiology

## 2024-04-25 VITALS — BP 148/70 | HR 72 | Ht 65.0 in | Wt 138.6 lb

## 2024-04-25 DIAGNOSIS — I10 Essential (primary) hypertension: Secondary | ICD-10-CM | POA: Diagnosis not present

## 2024-04-25 DIAGNOSIS — E782 Mixed hyperlipidemia: Secondary | ICD-10-CM | POA: Diagnosis not present

## 2024-04-25 DIAGNOSIS — I2510 Atherosclerotic heart disease of native coronary artery without angina pectoris: Secondary | ICD-10-CM | POA: Diagnosis not present

## 2024-04-25 DIAGNOSIS — Z952 Presence of prosthetic heart valve: Secondary | ICD-10-CM | POA: Diagnosis not present

## 2024-04-25 NOTE — Progress Notes (Signed)
 Clinical Summary [name] is a 88 y.o.female previously followed by Dr Addie Holstein, this is our first visit together. Seen today for the following medical problems.    1.Persistent afib - no palpitations.  - no bleeding on eliquis .    2. CAD - prior PCI to RCA in 1999. Repeat PCI to RCA 2012. Later had PTCA of RCA stent ISR - 11/2022 cath: no significant CAD, patent RCA stents.  - no recent chest pains. Chronic SOB/DOE that is stable.  - compliant with meds   3. Chronic HFpEF - can have some leg edema, typically L>R.  - has lasix  prn, has compression stockings. Only needs lasix  about 1-2 times per month.    4. Aortic stenosis s/p TAVR - s/p successful TAVR with a 23 mm Edwards Sapien 3 THV via the [place] approach on 01/27/23  - post procedure complicated by anemia, large extraperitoneal hematoma  - 02/2024 echo: LVEF 60-65%,  no WMAs, normal TAVR valve   5. HTN - from notes norvasc  had been avoided as she had history of edema not related to norvasc .  - reports high bp's often at doctors visits.  - she is compliant with meds   6. HLD - 01/2024 TC 111 TG 72 HDL 44 LDL 52 - she is on atorvastatin  20mg  daily.   Past Medical History:  Diagnosis Date   A-fib [facility])    Anxiety    CAD S/P percutaneous coronary angioplasty 1990s, 11/28/2011    a) midRCA PCI 1999 (NIR BMS 3.0 mm x 32 mm); distal  RCA BMS Sept 2012 (Vision BMS 2.5 mm x 15 mm); 12/02: RCA ISR --> Cutting PTCA    Coronary stent restenosis due to progression of disease 11/30/2011   Dyslipidemia, goal LDL below 70 11/28/2011   Essential hypertension    On ACE inhibitor and ARB? As well as atenolol  and hydralazine    Gastric ulcer 11/2018   GERD (gastroesophageal reflux disease)    History of blood transfusion    without reaction   Moderate aortic stenosis by prior echocardiogram 08/2011; 08/2013   Echo 11/09/18: EF 60-65%. Gr 2 DD.  No RWMA.  Mild-Mod AS (mean gradient 19 mmHg). Mild-Mod PAH. ->  Follow-up echo  March 2021 showed mean gradient 17 mmHg (dimensionless index 0.32)-this was reported as moderate-severe AS   Myocardial infarction (HCC) 1990s   Had PTCA then PCI on RCA; 11/2011--> Dyspnea and Shoulder pain prior to adm    Non-ST elevation MI (NSTEMI) (HCC) 11/28/2011   Dyspnea and Shoulder pain prior to adm   S/P TAVR (transcatheter aortic valve replacement) 01/27/2023   23mm S3UR via [place] approach with Dr. Abel Hoe and Dr. Sherene Dilling   Type 2 diabetes mellitus with complication, without long-term current use of insulin  (HCC)    no longer taking diabetic medication - diet controlled     Allergies  Allergen Reactions   Valium Nausea And Vomiting     Current Outpatient Medications  Medication Sig Dispense Refill   apixaban  (ELIQUIS ) 2.5 MG TABS tablet Take 1 tablet (2.5 mg total) by mouth 2 (two) times daily. 180 tablet 3   atorvastatin  (LIPITOR) 20 MG tablet TAKE ONE (1) TABLET EACH DAY 90 tablet 2   benazepril  (LOTENSIN ) 40 MG tablet Take 1 tablet (40 mg total) by mouth daily. 90 tablet 2   carvedilol  (COREG ) 25 MG tablet TAKE ONE (1) TABLET BY MOUTH TWO (2) TIMES DAILY 180 tablet 1   diphenhydramine -acetaminophen  (TYLENOL  PM) 25-500 MG TABS tablet  Take 2 tablets by mouth at bedtime.     furosemide  (LASIX ) 40 MG tablet Take 1 tablet (40 mg total) by mouth daily as needed for fluid. 90 tablet 3   hydrALAZINE  (APRESOLINE ) 100 MG tablet TAKE ONE (1) TABLET BY MOUTH TWO (2) TIMES DAILY 60 tablet 5   Iron , Ferrous Sulfate , 325 (65 Fe) MG TABS Take 325 mg by mouth daily. 30 tablet 3   loperamide  (IMODIUM  A-D) 2 MG tablet Take 2 mg by mouth 4 (four) times daily as needed for diarrhea or loose stools.     NITROSTAT  0.4 MG SL tablet DISSOLVE 1 TAB UNDER TOUNGE FOR CHEST PAIN. MAY REPEAT EVERY 5 MINUTES FOR 3 DOSES. IF NO RELIEF CALL 911 OR GO TO ER 25 tablet 3   pantoprazole  (PROTONIX ) 40 MG tablet TAKE 1 TABLET BY MOUTH TWICE DAILY BEFORE MEALS 60 tablet 5   sertraline  (ZOLOFT ) 25 MG tablet TAKE  ONE TABLET BY MOUTH AT BEDTIME 90 tablet 0   triamcinolone  cream (KENALOG ) 0.1 % APPLY TWICE DAILY 30 g 0   No current facility-administered medications for this visit.     Past Surgical History:  Procedure Laterality Date   ABDOMINAL HYSTERECTOMY     BIOPSY  06/03/2018   Procedure: BIOPSY;  Surgeon: Ruby Corporal, MD;  Location: [facility];  Service: Endoscopy;;  gastric    BIOPSY  09/15/2022   Procedure: BIOPSY;  Surgeon: Vinetta Greening, DO;  Location: [facility];  Service: Endoscopy;;   BREAST BIOPSY     CARDIAC CATHETERIZATION  2012   performed for angina   CARDIAC CATHETERIZATION     CARDIOVERSION N/A 10/24/2020   Procedure: CARDIOVERSION;  Surgeon: Hugh Madura, MD;  Location: [facility];  Service: Cardiovascular;  Laterality: N/A;   CORONARY ANGIOPLASTY  11/2006   Cutting Balloon of stent in the RCA   ESOPHAGOGASTRODUODENOSCOPY N/A 06/03/2018   Procedure: ESOPHAGOGASTRODUODENOSCOPY (EGD);  Surgeon: Ruby Corporal, MD;  Location: [facility];  Service: Endoscopy;  Laterality: N/A;  1:40   ESOPHAGOGASTRODUODENOSCOPY N/A 12/02/2018   Procedure: ESOPHAGOGASTRODUODENOSCOPY (EGD);  Surgeon: Ruby Corporal, MD;  Location: [facility];  Service: Endoscopy;  Laterality: N/A;  255   ESOPHAGOGASTRODUODENOSCOPY (EGD) WITH PROPOFOL  N/A 09/15/2022   Procedure: ESOPHAGOGASTRODUODENOSCOPY (EGD) WITH PROPOFOL ;  Surgeon: Vinetta Greening, DO;  Location: [facility];  Service: Endoscopy;  Laterality: N/A;  1:00pm, asa 3   LEFT HEART CATHETERIZATION WITH CORONARY ANGIOGRAM N/A 11/27/2011   Procedure: LEFT HEART CATHETERIZATION WITH CORONARY ANGIOGRAM;  Surgeon: Arleen Lacer, MD;  Location: [facility] CATH LAB;  Service: Cardiovascular;  Laterality: N/A;   PERCUTANEOUS CORONARY STENT INTERVENTION (PCI-S)  1999   Mid RCA - NIR DMS 3.0 mm x 30 mm, dRCA Vision BMS 2.5 mm x 15 mm    RIGHT HEART CATH AND CORONARY ANGIOGRAPHY N/A 11/24/2022   Procedure: RIGHT HEART CATH AND CORONARY  ANGIOGRAPHY;  Surgeon: Arleen Lacer, MD;  Location: [facility] INVASIVE CV LAB;  Service: Cardiovascular;  Laterality: N/A;   TEE WITHOUT CARDIOVERSION N/A 01/27/2023   Procedure: TRANSESOPHAGEAL ECHOCARDIOGRAM;  Surgeon: Odie Benne, MD;  Location: [facility] OR;  Service: Open Heart Surgery;  Laterality: N/A;   TRANSTHORACIC ECHOCARDIOGRAM  10/2018   EF 60-65%. Gr 2 DD.  No RWMA.  Mild-Mod AS  (mean gradient 19 mmHg). Mild-Mod PAH.   TRANSTHORACIC ECHOCARDIOGRAM  03/01/2020   EF 70 to 75%.  No or WMA.  Mild LVH.  GRII DD with severe LA enlargement.  Mildly elevated PAP.  Mild MR.  Moderate AS (mean gradient 17 mmHg) with dimensionless index of 0.32.   TUBAL LIGATION       Allergies  Allergen Reactions   Valium Nausea And Vomiting      Family History  Problem Relation Age of Onset   Cancer Mother    Heart disease Father    Heart disease Maternal Grandmother    Heart disease Maternal Grandfather    Heart disease Brother    Nephrolithiasis Brother        accidental death   Heart Problems Brother        CABG     Social History Ms. Fors reports that she has quit smoking. She has never used smokeless tobacco. Ms. Andreen reports no history of alcohol use.    Physical Examination Today's Vitals   04/25/24 1500 04/25/24 1627  BP: (!) 160/67 (!) 148/70  Pulse: 72   SpO2: 98%   Weight: 138 lb 9.6 oz (62.9 kg)   Height: 5\' 5"  (1.651 m)    Body mass index is 23.06 kg/m.  Gen: resting comfortably, no acute distress HEENT: no scleral icterus, pupils equal round and reactive, no palptable cervical adenopathy,  CV: irreg, no m/rg, no jvd Resp: Clear to auscultation bilaterally GI: abdomen is soft, non-tender, non-distended, normal bowel sounds, no hepatosplenomegaly MSK: extremities are warm, no edema.  Skin: warm, no rash Neuro:  no focal deficits Psych: appropriate affect     Assessment and Plan  1.CAD - no recent symptoms, continue current meds  2. Aortic  stenosis s/p TAVR - recent echo shows normal functioning TAVR valve - continue to monitor  3. HTN - she reports prior history of white coat HTN - bp elevated in clinic, she will monitor at home.  - for now continue current meds  4. HLD - at goal, continue current meds  5. Afib/acquired thrombophilia - no symptoms - EKG today shows rate controlled afib - continue current meds including eliquis  for stroke prevention   F/u 6months   Laurann Pollock, M.D.

## 2024-04-25 NOTE — Patient Instructions (Signed)

## 2024-05-02 ENCOUNTER — Other Ambulatory Visit: Payer: Self-pay | Admitting: Family Medicine

## 2024-05-02 DIAGNOSIS — F339 Major depressive disorder, recurrent, unspecified: Secondary | ICD-10-CM

## 2024-05-30 ENCOUNTER — Other Ambulatory Visit: Payer: Self-pay | Admitting: Gastroenterology

## 2024-05-30 DIAGNOSIS — K921 Melena: Secondary | ICD-10-CM

## 2024-05-30 DIAGNOSIS — Z8711 Personal history of peptic ulcer disease: Secondary | ICD-10-CM

## 2024-06-07 DIAGNOSIS — E1142 Type 2 diabetes mellitus with diabetic polyneuropathy: Secondary | ICD-10-CM | POA: Diagnosis not present

## 2024-06-07 DIAGNOSIS — M79674 Pain in right toe(s): Secondary | ICD-10-CM | POA: Diagnosis not present

## 2024-06-07 DIAGNOSIS — L84 Corns and callosities: Secondary | ICD-10-CM | POA: Diagnosis not present

## 2024-06-07 DIAGNOSIS — M79675 Pain in left toe(s): Secondary | ICD-10-CM | POA: Diagnosis not present

## 2024-06-07 DIAGNOSIS — B351 Tinea unguium: Secondary | ICD-10-CM | POA: Diagnosis not present

## 2024-06-13 ENCOUNTER — Other Ambulatory Visit: Payer: Self-pay | Admitting: Family Medicine

## 2024-06-13 DIAGNOSIS — E1159 Type 2 diabetes mellitus with other circulatory complications: Secondary | ICD-10-CM

## 2024-06-13 DIAGNOSIS — I110 Hypertensive heart disease with heart failure: Secondary | ICD-10-CM

## 2024-06-27 ENCOUNTER — Ambulatory Visit: Payer: Medicare Other

## 2024-06-27 VITALS — BP 148/70 | HR 72 | Ht 65.0 in | Wt 138.0 lb

## 2024-06-27 DIAGNOSIS — Z0000 Encounter for general adult medical examination without abnormal findings: Secondary | ICD-10-CM

## 2024-06-27 NOTE — Patient Instructions (Signed)
 Ms. [name] , Thank you for taking time out of your busy schedule to complete your Annual Wellness Visit with me. I enjoyed our conversation and look forward to speaking with you again next year. I, as well as your care team,  appreciate your ongoing commitment to your health goals. Please review the following plan we discussed and let me know if I can assist you in the future. Your Game plan/ To Do List    Follow up Visits: Next Medicare AWV with our clinical staff: 06/28/25 at 10;00a.m.   Next Office Visit with your provider: 8/15/2:35p.m.  Clinician Recommendations:  Aim for 30 minutes of exercise or brisk walking, 6-8 glasses of water , and 5 servings of fruits and vegetables each day.       This is a list of the screening recommended for you and due dates:  Health Maintenance  Topic Date Due   Eye exam for diabetics  Never done   Hepatitis B Vaccine (1 of 3 - Risk 3-dose series) Never done   Medicare Annual Wellness Visit  06/23/2024   COVID-19 Vaccine (7 - 2024-25 season) 07/13/2025*   Flu Shot  07/22/2024   Complete foot exam   07/28/2024   Hemoglobin A1C  08/04/2024   DTaP/Tdap/Td vaccine (4 - Td or Tdap) 07/28/2033   Pneumococcal Vaccine for age over 81  Completed   DEXA scan (bone density measurement)  Completed   Zoster (Shingles) Vaccine  Completed   HPV Vaccine  Aged Out   Meningitis B Vaccine  Aged Out  *Topic was postponed. The date shown is not the original due date.    Advanced directives: (Declined) Advance directive discussed with you today. Even though you declined this today, please call our office should you change your mind, and we can give you the proper paperwork for you to fill out. Advance Care Planning is important because it:  [x]  Makes sure you receive the medical care that is consistent with your values, goals, and preferences  [x]  It provides guidance to your family and loved ones and reduces their decisional burden about whether or not they are making  the right decisions based on your wishes.  Follow the link provided in your after visit summary or read over the paperwork we have mailed to you to help you started getting your Advance Directives in place. If you need assistance in completing these, please reach out to us  so that we can help you!  See attachments for Preventive Care and Fall Prevention Tips.

## 2024-06-27 NOTE — Progress Notes (Signed)
 Subjective:   ALLANNAH KEMPEN is a 88 y.o. who presents for a Medicare Wellness preventive visit.  As a reminder, Annual Wellness Visits don't include a physical exam, and some assessments may be limited, especially if this visit is performed virtually. We may recommend an in-person follow-up visit with your provider if needed.  Visit Complete: Virtual I connected with  Orlean DELENA Fitting on 06/27/24 by a audio enabled telemedicine application and verified that I am speaking with the correct person using two identifiers.  Patient Location: Home  Provider Location: Home Office  I discussed the limitations of evaluation and management by telemedicine. The patient expressed understanding and agreed to proceed.  Vital Signs: Because this visit was a virtual/telehealth visit, some criteria may be missing or patient reported. Any vitals not documented were not able to be obtained and vitals that have been documented are patient reported.  VideoDeclined- This patient declined Librarian, academic. Therefore the visit was completed with audio only.  Persons Participating in Visit: Patient.  AWV Questionnaire: No: Patient Medicare AWV questionnaire was not completed prior to this visit.  Cardiac Risk Factors include: advanced age (>14men, >74 women);dyslipidemia;hypertension;Other (see comment), Risk factor comments: CAD     Objective:    Today's Vitals   06/27/24 1019  BP: (!) 148/70  Pulse: 72  Weight: 138 lb (62.6 kg)  Height: 5' 5 (1.651 m)   Body mass index is 22.96 kg/m.     06/27/2024   10:38 AM 06/27/2024   10:23 AM 08/19/2023    8:22 PM 06/24/2023   12:05 PM 02/03/2023   10:21 AM 01/27/2023    8:25 AM 11/24/2022    6:52 AM  Advanced Directives  Does Patient Have a Medical Advance Directive? No No No Yes No No No  Type of Theme park manager;Living will     Copy of Healthcare Power of Attorney in Chart?    No - copy  requested     Would patient like information on creating a medical advance directive?   No - Patient declined   No - Patient declined No - Patient declined    Current Medications (verified) Outpatient Encounter Medications as of 06/27/2024  Medication Sig   apixaban  (ELIQUIS ) 2.5 MG TABS tablet Take 1 tablet (2.5 mg total) by mouth 2 (two) times daily.   atorvastatin  (LIPITOR) 20 MG tablet TAKE ONE (1) TABLET EACH DAY   benazepril  (LOTENSIN ) 40 MG tablet Take 1 tablet (40 mg total) by mouth daily.   carvedilol  (COREG ) 25 MG tablet TAKE ONE (1) TABLET BY MOUTH TWO (2) TIMES DAILY   diphenhydramine -acetaminophen  (TYLENOL  PM) 25-500 MG TABS tablet Take 2 tablets by mouth at bedtime.   furosemide  (LASIX ) 40 MG tablet Take 1 tablet (40 mg total) by mouth daily as needed for fluid.   hydrALAZINE  (APRESOLINE ) 100 MG tablet TAKE ONE (1) TABLET BY MOUTH TWO (2) TIMES DAILY   Iron , Ferrous Sulfate , 325 (65 Fe) MG TABS Take 325 mg by mouth daily.   loperamide  (IMODIUM  A-D) 2 MG tablet Take 2 mg by mouth 4 (four) times daily as needed for diarrhea or loose stools.   NITROSTAT  0.4 MG SL tablet DISSOLVE 1 TAB UNDER TOUNGE FOR CHEST PAIN. MAY REPEAT EVERY 5 MINUTES FOR 3 DOSES. IF NO RELIEF CALL 911 OR GO TO ER   pantoprazole  (PROTONIX ) 40 MG tablet TAKE 1 TABLET BY MOUTH TWICE DAILY BEFORE MEALS   sertraline  (ZOLOFT ) 25 MG  tablet TAKE ONE TABLET BY MOUTH AT BEDTIME   triamcinolone  cream (KENALOG ) 0.1 % APPLY TWICE DAILY   No facility-administered encounter medications on file as of 06/27/2024.    Allergies (verified) Valium   History: Past Medical History:  Diagnosis Date   A-fib (HCC)    Anxiety    CAD S/P percutaneous coronary angioplasty 1990s, 11/28/2011    a) midRCA PCI 1999 (NIR BMS 3.0 mm x 32 mm); distal  RCA BMS Sept 2012 (Vision BMS 2.5 mm x 15 mm); 12/02: RCA ISR --> Cutting PTCA    Coronary stent restenosis due to progression of disease 11/30/2011   Dyslipidemia, goal LDL below 70  11/28/2011   Essential hypertension    On ACE inhibitor and ARB? As well as atenolol  and hydralazine    Gastric ulcer 11/2018   GERD (gastroesophageal reflux disease)    History of blood transfusion    without reaction   Moderate aortic stenosis by prior echocardiogram 08/2011; 08/2013   Echo 11/09/18: EF 60-65%. Gr 2 DD.  No RWMA.  Mild-Mod AS (mean gradient 19 mmHg). Mild-Mod PAH. 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 Verlin and Dr. Lucas   Type 2 diabetes mellitus with complication, without long-term current use of insulin  (HCC)    no longer taking diabetic medication - diet controlled   Past Surgical History:  Procedure Laterality Date   ABDOMINAL HYSTERECTOMY     BIOPSY  06/03/2018   Procedure: BIOPSY;  Surgeon: Golda Claudis PENNER, MD;  Location: [facility];  Service: Endoscopy;;  gastric    BIOPSY  09/15/2022   Procedure: BIOPSY;  Surgeon: Cindie Carlin POUR, DO;  Location: [facility];  Service: Endoscopy;;   BREAST BIOPSY     CARDIAC CATHETERIZATION  2012   performed for angina   CARDIAC CATHETERIZATION     CARDIOVERSION N/A 10/24/2020   Procedure: CARDIOVERSION;  Surgeon: Jeffrie Oneil BROCKS, MD;  Location: [facility];  Service: Cardiovascular;  Laterality: N/A;   CORONARY ANGIOPLASTY  11/2006   Cutting Balloon of stent in the RCA   ESOPHAGOGASTRODUODENOSCOPY N/A 06/03/2018   Procedure: ESOPHAGOGASTRODUODENOSCOPY (EGD);  Surgeon: Golda Claudis PENNER, MD;  Location: [facility];  Service: Endoscopy;  Laterality: N/A;  1:40   ESOPHAGOGASTRODUODENOSCOPY N/A 12/02/2018   Procedure:  ESOPHAGOGASTRODUODENOSCOPY (EGD);  Surgeon: Golda Claudis PENNER, MD;  Location: [facility];  Service: Endoscopy;  Laterality: N/A;  255   ESOPHAGOGASTRODUODENOSCOPY (EGD) WITH PROPOFOL  N/A 09/15/2022   Procedure: ESOPHAGOGASTRODUODENOSCOPY (EGD) WITH PROPOFOL ;  Surgeon: Cindie Carlin POUR, DO;  Location: [facility];  Service: Endoscopy;  Laterality: N/A;  1:00pm, asa 3   LEFT HEART CATHETERIZATION WITH CORONARY ANGIOGRAM N/A 11/27/2011   Procedure: LEFT HEART CATHETERIZATION WITH CORONARY ANGIOGRAM;  Surgeon: Alm LELON Clay, MD;  Location: [facility] CATH LAB;  Service: Cardiovascular;  Laterality: N/A;   PERCUTANEOUS CORONARY STENT INTERVENTION (PCI-S)  1999   Mid RCA - NIR DMS 3.0 mm x 30 mm, dRCA Vision BMS 2.5 mm x 15 mm    RIGHT HEART CATH AND CORONARY ANGIOGRAPHY N/A 11/24/2022   Procedure: RIGHT HEART CATH AND  CORONARY ANGIOGRAPHY;  Surgeon: Anner Alm ORN, MD;  Location: [facility] INVASIVE CV LAB;  Service: Cardiovascular;  Laterality: N/A;   TEE WITHOUT CARDIOVERSION N/A 01/27/2023   Procedure: TRANSESOPHAGEAL ECHOCARDIOGRAM;  Surgeon: Verlin Lonni BIRCH, MD;  Location[facility] OR;  Service: Open Heart Surgery;  Laterality: N/A;   TRANSTHORACIC ECHOCARDIOGRAM  10/2018   EF 60-65%. Gr 2 DD.  No RWMA.  Mild-Mod AS  (mean gradient 19 mmHg). Mild-Mod PAH.   TRANSTHORACIC ECHOCARDIOGRAM  03/01/2020   EF 70 to 75%.  No or WMA.  Mild LVH.  GRII DD with severe LA enlargement.  Mildly elevated PAP.  Mild MR.  Moderate AS (mean gradient 17 mmHg) with dimensionless index of 0.32.   TUBAL LIGATION     Family History  Problem Relation Age of Onset   Cancer Mother    Heart disease Father    Heart disease Maternal Grandmother    Heart disease Maternal Grandfather    Heart disease Brother    Nephrolithiasis Brother        accidental death   Heart Problems Brother        CABG   Social History   Socioeconomic History   Marital status: Widowed    Spouse name: Gaither   Number of children: 3   Years of  education: Not on file   Highest education level: Not on file  Occupational History   Occupation: Retired Engineer, civil (consulting)  Tobacco Use   Smoking status: Former   Smokeless tobacco: Never   Tobacco comments:    Quit approximately 1993  Vaping Use   Vaping status: Never Used  Substance and Sexual Activity   Alcohol use: No   Drug use: No   Sexual activity: Not Currently    Birth control/protection: Post-menopausal  Other Topics Concern   Not on file  Social History Narrative   She is a married mother of 3, 2 daughters, 1 son who all live out of state but talks to weekly.   Step-son and his family live next door. Sees often.    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    Quit smoking in '99, denies alcohol.   She volunteers for [employer].   Social Drivers of Corporate investment banker Strain: Low Risk  (06/27/2024)   Overall Financial Resource Strain (CARDIA)    Difficulty of Paying Living Expenses: Not hard at all  Food Insecurity: No Food Insecurity (06/27/2024)   Hunger Vital Sign    Worried About Running Out of Food in the Last Year: Never true    Ran Out of Food in the Last Year: Never true  Transportation Needs: No Transportation Needs (06/27/2024)   PRAPARE - Administrator, Civil Service (Medical): No    Lack of Transportation (Non-Medical): No  Physical Activity: Inactive (06/27/2024)   Exercise Vital Sign    Days of Exercise per Week: 0 days    Minutes of Exercise per Session: 0 min  Stress: No Stress Concern Present (06/27/2024)   [employer] of Occupational Health - Occupational Stress Questionnaire    Feeling of Stress: Not at all  Social Connections: Socially Isolated (06/27/2024)   Social Connection and Isolation Panel    Frequency of Communication with Friends and Family: More than three times a week    Frequency of Social Gatherings with Friends and Family: More than three times a week    Attends Religious Services: Never    Doctor, general practice or  Organizations: No    Attends Banker Meetings: Never    Marital Status: Widowed    Tobacco Counseling Counseling given: Yes Tobacco comments: Quit approximately 1993    Clinical Intake:  Pre-visit preparation completed: Yes  Pain : No/denies pain     BMI - recorded: 22.96 Nutritional Status: BMI of 19-24  Normal Nutritional Risks: None Diabetes: No  Lab Results  Component Value Date   HGBA1C 5.7 (H) 02/05/2024   HGBA1C 5.6 11/05/2023   HGBA1C 5.7 (H) 07/29/2023     How often do you need to have someone help you when you read instructions, pamphlets, or other written materials from your doctor or pharmacy?: 1 - Never  Interpreter Needed?: No  Information entered by :: alia t/cma   Activities of Daily Living     06/27/2024   10:22 AM  In your present state of health, do you have any difficulty performing the following activities:  Hearing? 1  Vision? 0  Difficulty concentrating or making decisions? 0  Walking or climbing stairs? 0  Dressing or bathing? 0  Doing errands, shopping? 1  Comment pt's children  Preparing Food and eating ? N  Using the Toilet? N  In the past six months, have you accidently leaked urine? Y  Do you have problems with loss of bowel control? Y  Managing your Medications? N  Managing your Finances? N  Housekeeping or managing your Housekeeping? N    Patient Care Team: Severa Rock HERO, FNP as PCP - General (Family Medicine) Anner Alm ORN, MD as PCP - Cardiology (Cardiology) Ladora Ross Lacy Phebe, MD as Referring Physician (Optometry)  I have updated your Care Teams any recent Medical Services you may have received from other providers in the past year.     Assessment:   This is a routine wellness examination for [place].  Hearing/Vision screen Hearing Screening - Comments:: Pt uses hearing aids Vision Screening - Comments:: Pt wear glasses denies vision/pt goes [facility] in [place], [place]/last fall 2024   Goals  Addressed               This Visit's Progress     Exercise 3x per week (30 min per time)   On track     Continue to move as tolerated.      Patient Stated (pt-stated)   On track     Pt would like to clean her house       Depression Screen     06/27/2024   10:42 AM 02/05/2024    2:43 PM 11/05/2023   10:57 AM 11/05/2023   10:48 AM 07/29/2023   11:07 AM 06/24/2023   12:04 PM 05/05/2023   11:59 AM  PHQ 2/9 Scores  PHQ - 2 Score 0 0 0 0 2 0 5  PHQ- 9 Score  4 3  8  0 13    Fall Risk     06/27/2024   10:26 AM 02/05/2024    2:43 PM 11/05/2023   10:57 AM 11/05/2023   10:47 AM 07/29/2023   11:07 AM  Fall Risk   Falls in the past year? 0 0 0 0 1  Number falls in past yr: 0    1  Injury with Fall? 0    0  Risk for fall due to : No Fall Risks No Fall Risks     Follow up Falls evaluation completed Falls evaluation completed   Falls prevention discussed    MEDICARE RISK AT HOME:  Medicare Risk  at Home Any stairs in or around the home?: Yes If so, are there any without handrails?: Yes Home free of loose throw rugs in walkways, pet beds, electrical cords, etc?: Yes Adequate lighting in your home to reduce risk of falls?: Yes Life alert?: No Use of a cane, walker or w/c?: Yes (walker) Grab bars in the bathroom?: Yes Shower chair or bench in shower?: Yes Elevated toilet seat or a handicapped toilet?: Yes  TIMED UP AND GO:  Was the test performed?  no  Cognitive Function: 6CIT completed        06/27/2024   10:23 AM 06/24/2023   12:06 PM 06/18/2022   12:06 PM  6CIT Screen  What Year? 0 points 0 points 0 points  What month? 0 points 0 points 0 points  What time? 0 points 0 points 0 points  Count back from 20 0 points 0 points 0 points  Months in reverse 0 points 0 points 2 points  Repeat phrase 0 points 0 points 0 points  Total Score 0 points 0 points 2 points    Immunizations Immunization History  Administered Date(s) Administered   Influenza-Unspecified 12/11/2020    Moderna Covid-19 Vaccine Bivalent Booster 68yrs & up 11/27/2020, 06/13/2021, 10/12/2021   Moderna SARS-COV2 Booster Vaccination 06/13/2021   Moderna Sars-Covid-2 Vaccination 02/15/2020, 03/14/2020   PNEUMOCOCCAL CONJUGATE-20 07/29/2023   Pneumococcal Conjugate,unspecified 09/27/2008   Pneumococcal Conjugate-13 03/09/2014   Pneumococcal-Unspecified 09/24/2008   Td 10/07/2011, 07/29/2023   Tdap 10/07/2011   Zoster Recombinant(Shingrix) 07/30/2020, 12/11/2020   Zoster, Live 11/18/2011    Screening Tests Health Maintenance  Topic Date Due   OPHTHALMOLOGY EXAM  Never done   Hepatitis B Vaccines (1 of 3 - Risk 3-dose series) Never done   COVID-19 Vaccine (7 - 2024-25 season) 07/13/2025 (Originally 08/23/2023)   INFLUENZA VACCINE  07/22/2024   FOOT EXAM  07/28/2024   HEMOGLOBIN A1C  08/04/2024   Medicare Annual Wellness (AWV)  06/27/2025   DTaP/Tdap/Td (4 - Td or Tdap) 07/28/2033   Pneumococcal Vaccine: 50+ Years  Completed   DEXA SCAN  Completed   Zoster Vaccines- Shingrix  Completed   HPV VACCINES  Aged Out   Meningococcal B Vaccine  Aged Out    Health Maintenance  Health Maintenance Due  Topic Date Due   OPHTHALMOLOGY EXAM  Never done   Hepatitis B Vaccines (1 of 3 - Risk 3-dose series) Never done   Health Maintenance Items Addressed: See Nurse Notes at the end of this note  Additional Screening:  Vision Screening: Recommended annual ophthalmology exams for early detection of glaucoma and other disorders of the eye. Would you like a referral to an eye doctor? No    Dental Screening: Recommended annual dental exams for proper oral hygiene  Community Resource Referral / Chronic Care Management: CRR required this visit?  No   CCM required this visit?  No   Plan:    I have personally reviewed and noted the following in the patient's chart:   Medical and social history Use of alcohol, tobacco or illicit drugs  Current medications and supplements including opioid  prescriptions. Patient is not currently taking opioid prescriptions. Functional ability and status Nutritional status Physical activity Advanced directives List of other physicians Hospitalizations, surgeries, and ER visits in previous 12 months Vitals Screenings to include cognitive, depression, and falls Referrals and appointments  In addition, I have reviewed and discussed with patient certain preventive protocols, quality metrics, and best practice recommendations. A written personalized care plan for  preventive services as well as general preventive health recommendations were provided to patient.   Ozie Ned, CMA   06/27/2024   After Visit Summary: (Declined) Due to this being a telephonic visit, with patients personalized plan was offered to patient but patient Declined AVS at this time   Notes: Nothing significant to report at this time.

## 2024-07-15 ENCOUNTER — Other Ambulatory Visit: Payer: Self-pay | Admitting: Family Medicine

## 2024-07-25 ENCOUNTER — Other Ambulatory Visit: Payer: Self-pay | Admitting: Family Medicine

## 2024-07-25 DIAGNOSIS — E1159 Type 2 diabetes mellitus with other circulatory complications: Secondary | ICD-10-CM

## 2024-07-25 DIAGNOSIS — I110 Hypertensive heart disease with heart failure: Secondary | ICD-10-CM

## 2024-08-05 ENCOUNTER — Ambulatory Visit: Payer: Medicare Other | Admitting: Family Medicine

## 2024-08-09 ENCOUNTER — Encounter: Payer: Self-pay | Admitting: Family Medicine

## 2024-08-19 ENCOUNTER — Other Ambulatory Visit: Payer: Self-pay | Admitting: Family Medicine

## 2024-09-02 ENCOUNTER — Other Ambulatory Visit: Payer: Self-pay | Admitting: Family Medicine

## 2024-09-02 DIAGNOSIS — E1159 Type 2 diabetes mellitus with other circulatory complications: Secondary | ICD-10-CM

## 2024-09-02 DIAGNOSIS — I110 Hypertensive heart disease with heart failure: Secondary | ICD-10-CM

## 2024-09-06 DIAGNOSIS — E1142 Type 2 diabetes mellitus with diabetic polyneuropathy: Secondary | ICD-10-CM | POA: Diagnosis not present

## 2024-09-06 DIAGNOSIS — B351 Tinea unguium: Secondary | ICD-10-CM | POA: Diagnosis not present

## 2024-09-06 DIAGNOSIS — M79675 Pain in left toe(s): Secondary | ICD-10-CM | POA: Diagnosis not present

## 2024-09-06 DIAGNOSIS — M79674 Pain in right toe(s): Secondary | ICD-10-CM | POA: Diagnosis not present

## 2024-09-06 DIAGNOSIS — L84 Corns and callosities: Secondary | ICD-10-CM | POA: Diagnosis not present

## 2024-09-13 ENCOUNTER — Encounter: Payer: Self-pay | Admitting: Family Medicine

## 2024-09-13 ENCOUNTER — Ambulatory Visit (INDEPENDENT_AMBULATORY_CARE_PROVIDER_SITE_OTHER): Admitting: Family Medicine

## 2024-09-13 VITALS — BP 112/53 | HR 78 | Temp 97.2°F | Ht 65.0 in | Wt 132.6 lb

## 2024-09-13 DIAGNOSIS — E785 Hyperlipidemia, unspecified: Secondary | ICD-10-CM

## 2024-09-13 DIAGNOSIS — E1169 Type 2 diabetes mellitus with other specified complication: Secondary | ICD-10-CM | POA: Diagnosis not present

## 2024-09-13 DIAGNOSIS — E119 Type 2 diabetes mellitus without complications: Secondary | ICD-10-CM

## 2024-09-13 DIAGNOSIS — I4819 Other persistent atrial fibrillation: Secondary | ICD-10-CM | POA: Diagnosis not present

## 2024-09-13 DIAGNOSIS — M545 Low back pain, unspecified: Secondary | ICD-10-CM | POA: Diagnosis not present

## 2024-09-13 DIAGNOSIS — I152 Hypertension secondary to endocrine disorders: Secondary | ICD-10-CM

## 2024-09-13 DIAGNOSIS — F321 Major depressive disorder, single episode, moderate: Secondary | ICD-10-CM

## 2024-09-13 DIAGNOSIS — E1159 Type 2 diabetes mellitus with other circulatory complications: Secondary | ICD-10-CM | POA: Diagnosis not present

## 2024-09-13 DIAGNOSIS — I5032 Chronic diastolic (congestive) heart failure: Secondary | ICD-10-CM | POA: Diagnosis not present

## 2024-09-13 DIAGNOSIS — Z7985 Long-term (current) use of injectable non-insulin antidiabetic drugs: Secondary | ICD-10-CM | POA: Diagnosis not present

## 2024-09-13 DIAGNOSIS — Z952 Presence of prosthetic heart valve: Secondary | ICD-10-CM

## 2024-09-13 DIAGNOSIS — Z23 Encounter for immunization: Secondary | ICD-10-CM | POA: Diagnosis not present

## 2024-09-13 DIAGNOSIS — E559 Vitamin D deficiency, unspecified: Secondary | ICD-10-CM | POA: Diagnosis not present

## 2024-09-13 DIAGNOSIS — I110 Hypertensive heart disease with heart failure: Secondary | ICD-10-CM | POA: Diagnosis not present

## 2024-09-13 LAB — BAYER DCA HB A1C WAIVED: HB A1C (BAYER DCA - WAIVED): 5.7 % — ABNORMAL HIGH (ref 4.8–5.6)

## 2024-09-13 MED ORDER — AMOXICILLIN 500 MG PO CAPS: 2000.0000 mg | ORAL_CAPSULE | Freq: Once | ORAL | 1 refills | Status: DC | PRN

## 2024-09-13 MED ORDER — SERTRALINE HCL 50 MG PO TABS: 50.0000 mg | ORAL_TABLET | Freq: Every day | ORAL | 3 refills | Status: DC

## 2024-09-13 NOTE — Progress Notes (Signed)
 Subjective:  Patient ID: [name], female    DOB: 1936-07-19, 88 y.o.   MRN: [id number]  Patient Care Team: Severa Rock HERO, FNP as PCP - General (Family Medicine) Anner Alm ORN, MD as PCP - Cardiology (Cardiology) Ladora Ross Lacy Phebe, MD as Referring Physician [facility])   Chief Complaint:  Medical Management of Chronic Issues (6 month follow up ) and dental apt (Has dental appointment and need abx since heart problems )   HPI: [name] is a 88 y.o. female presenting on 09/13/2024 for Medical Management of Chronic Issues (6 month follow up ) and dental apt (Has dental appointment and need abx since heart problems )   [name] is an 89 year old female with atrial fibrillation and aortic valve replacement who presents for a follow-up visit.  She was last seen in February for a rash, which has since resolved. She missed a previous appointment due to confusion about its cancellation.  There are no significant changes in her atrial fibrillation symptoms, stating 'it's all the same as it's been.' She is scheduled to see her cardiologist in November. She requires antibiotics before dental visits due to her aortic valve replacement and has been using amoxicillin  for this purpose.  She has experienced back discomfort and a fall where she slipped out of a chair, resulting in soreness in her hip for several days. She manages pain with Tylenol  and does not require additional pain medication.  She is currently taking sertraline  25 mg for depression but reports a lack of motivation and excessive sleep.  She has lost 66 pounds and is on Mounjaro 15 mg for weight management. Her A1c is 5.7. She drinks about 100 ounces of water  daily and follows a high-protein, low-carb diet.  She finds it difficult to walk long distances and is considering the use of a manual wheelchair for mobility assistance in stores.        Relevant past medical, surgical, family, and social  history reviewed and updated as indicated.  Allergies and medications reviewed and updated. Data reviewed: Chart in Epic.   Past Medical History:  Diagnosis Date   A-fib [facility])    Anxiety    CAD S/P percutaneous coronary angioplasty 1990s, 11/28/2011    a) midRCA PCI 1999 (NIR BMS 3.0 mm x 32 mm); distal  RCA BMS Sept 2012 (Vision BMS 2.5 mm x 15 mm); 12/02: RCA ISR --> Cutting PTCA    Coronary stent restenosis due to progression of disease 11/30/2011   Dyslipidemia, goal LDL below 70 11/28/2011   Essential hypertension    On ACE inhibitor and ARB? As well as atenolol  and hydralazine    Gastric ulcer 11/2018   GERD (gastroesophageal reflux disease)    History of blood transfusion    without reaction   Moderate aortic stenosis by prior echocardiogram 08/2011; 08/2013   Echo 11/09/18: EF 60-65%. Gr 2 DD.  No RWMA.  Mild-Mod AS (mean gradient 19 mmHg). Mild-Mod PAH. 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 Verlin and Dr. Lucas   Type 2 diabetes mellitus  with complication, without long-term current use of insulin  (HCC)    no longer taking diabetic medication - diet controlled    Past Surgical History:  Procedure Laterality Date   ABDOMINAL HYSTERECTOMY     BIOPSY  06/03/2018   Procedure: BIOPSY;  Surgeon: Golda Claudis PENNER, MD;  Location: [facility];  Service: Endoscopy;;  gastric    BIOPSY  09/15/2022   Procedure: BIOPSY;  Surgeon: Cindie Carlin POUR, DO;  Location: [facility];  Service: Endoscopy;;   BREAST BIOPSY     CARDIAC CATHETERIZATION  2012   performed for angina   CARDIAC CATHETERIZATION      CARDIOVERSION N/A 10/24/2020   Procedure: CARDIOVERSION;  Surgeon: Jeffrie Oneil BROCKS, MD;  Location: [facility];  Service: Cardiovascular;  Laterality: N/A;   CORONARY ANGIOPLASTY  11/2006   Cutting Balloon of stent in the RCA   ESOPHAGOGASTRODUODENOSCOPY N/A 06/03/2018   Procedure: ESOPHAGOGASTRODUODENOSCOPY (EGD);  Surgeon: Golda Claudis PENNER, MD;  Location: [facility];  Service: Endoscopy;  Laterality: N/A;  1:40   ESOPHAGOGASTRODUODENOSCOPY N/A 12/02/2018   Procedure: ESOPHAGOGASTRODUODENOSCOPY (EGD);  Surgeon: Golda Claudis PENNER, MD;  [facility]: [facility];  Service: Endoscopy;  Laterality: N/A;  255   ESOPHAGOGASTRODUODENOSCOPY (EGD) WITH PROPOFOL  N/A 09/15/2022   Procedure: ESOPHAGOGASTRODUODENOSCOPY (EGD) WITH PROPOFOL ;  Surgeon: Cindie Carlin POUR, DO;  Location: [facility];  Service: Endoscopy;  Laterality: N/A;  1:00pm, asa 3   LEFT HEART CATHETERIZATION WITH CORONARY ANGIOGRAM N/A 11/27/2011   Procedure: LEFT HEART CATHETERIZATION WITH CORONARY ANGIOGRAM;  Surgeon: Alm LELON Clay, MD;  Location: [facility] CATH LAB;  Service: Cardiovascular;  Laterality: N/A;   PERCUTANEOUS CORONARY STENT INTERVENTION (PCI-S)  1999   Mid RCA - NIR DMS 3.0 mm x 30 mm, dRCA Vision BMS 2.5 mm x 15 mm    RIGHT HEART CATH AND CORONARY ANGIOGRAPHY N/A 11/24/2022   Procedure: RIGHT HEART CATH AND CORONARY ANGIOGRAPHY;  Surgeon: Clay Alm LELON, MD;  Location: [facility] INVASIVE CV LAB;  Service: Cardiovascular;  Laterality: N/A;   TEE WITHOUT CARDIOVERSION N/A 01/27/2023   Procedure: TRANSESOPHAGEAL ECHOCARDIOGRAM;  Surgeon: Verlin Lonni BIRCH, MD;  Location: [facility] OR;  Service: Open Heart Surgery;  Laterality: N/A;   TRANSTHORACIC ECHOCARDIOGRAM  10/2018   EF 60-65%. Gr 2 DD.  No RWMA.  Mild-Mod AS  (mean gradient 19 mmHg). Mild-Mod PAH.   TRANSTHORACIC ECHOCARDIOGRAM  03/01/2020   EF 70 to 75%.  No or WMA.  Mild LVH.  GRII DD with severe LA enlargement.  Mildly elevated PAP.  Mild MR.  Moderate AS (mean gradient 17  mmHg) with dimensionless index of 0.32.   TUBAL LIGATION      Social History   Socioeconomic History   Marital status: Widowed    Spouse name: Gaither   Number of children: 3   Years of education: Not on file   Highest education level: Not on file  Occupational History   Occupation: Retired Engineer, civil (consulting)  Tobacco Use   Smoking status: Former   Smokeless tobacco: Never   Tobacco comments:    Quit approximately 1993  Vaping Use   Vaping status: Never Used  Substance and Sexual Activity   Alcohol use: No   Drug use: No   Sexual activity: Not Currently    Birth control/protection: Post-menopausal  Other Topics Concern   Not on file  Social History Narrative   She is a married mother of 3, 2 daughters, 1 son who all live out of state but talks to weekly.  Step-son and his family live next door. Sees often.    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    Quit smoking in '99, denies alcohol.   She volunteers for [employer].   Social Drivers of Corporate investment banker Strain: Low Risk  (06/27/2024)   Overall Financial Resource Strain (CARDIA)    Difficulty of Paying Living Expenses: Not hard at all  Food Insecurity: No Food Insecurity (06/27/2024)   Hunger Vital Sign    Worried About Running Out of Food in the Last Year: Never true    Ran Out of Food in the Last Year: Never true  Transportation Needs: No Transportation Needs (06/27/2024)   PRAPARE - Administrator, Civil Service (Medical): No    Lack of Transportation (Non-Medical): No  Physical Activity: Inactive (06/27/2024)   Exercise Vital Sign    Days of Exercise per Week: 0 days    Minutes of Exercise per Session: 0 min  Stress: No Stress Concern Present (06/27/2024)   [employer] of Occupational Health - Occupational Stress Questionnaire    Feeling of Stress: Not at all  Social Connections: Socially Isolated (06/27/2024)   Social Connection and Isolation Panel    Frequency of Communication with Friends  and Family: More than three times a week    Frequency of Social Gatherings with Friends and Family: More than three times a week    Attends Religious Services: Never    Database administrator or Organizations: No    Attends Banker Meetings: Never    Marital Status: Widowed  Intimate Partner Violence: Not At Risk (06/27/2024)   Humiliation, Afraid, Rape, and Kick questionnaire    Fear of Current or Ex-Partner: No    Emotionally Abused: No    Physically Abused: No    Sexually Abused: No    Outpatient Encounter Medications as of 09/13/2024  Medication Sig   amoxicillin  (AMOXIL ) 500 MG capsule Take 4 capsules (2,000 mg total) by mouth once as needed for up to 1 dose (30-60 minutes prior to dental appointment).   apixaban  (ELIQUIS ) 2.5 MG TABS tablet Take 1 tablet (2.5 mg total) by mouth 2 (two) times daily.   atorvastatin  (LIPITOR) 20 MG tablet TAKE ONE (1) TABLET EACH DAY   benazepril  (LOTENSIN ) 40 MG tablet TAKE ONE (1) TABLET BY MOUTH EVERY DAY   carvedilol  (COREG ) 25 MG tablet TAKE ONE (1) TABLET BY MOUTH TWO (2) TIMES DAILY   diphenhydramine -acetaminophen  (TYLENOL  PM) 25-500 MG TABS tablet Take 2 tablets by mouth at bedtime.   furosemide  (LASIX ) 40 MG tablet Take 1 tablet (40 mg total) by mouth daily as needed for fluid.   hydrALAZINE  (APRESOLINE ) 100 MG tablet TAKE ONE (1) TABLET BY MOUTH TWO (2) TIMES DAILY   Iron , Ferrous Sulfate , 325 (65 Fe) MG TABS Take 325 mg by mouth daily.   loperamide  (IMODIUM  A-D) 2 MG tablet Take 2 mg by mouth 4 (four) times daily as needed for diarrhea or loose stools.   NITROSTAT  0.4 MG SL tablet DISSOLVE 1 TAB UNDER TOUNGE FOR CHEST PAIN. MAY REPEAT EVERY 5 MINUTES FOR 3 DOSES. IF NO RELIEF CALL 911 OR GO TO ER   pantoprazole  (PROTONIX ) 40 MG tablet TAKE 1 TABLET BY MOUTH TWICE DAILY BEFORE MEALS   sertraline  (ZOLOFT ) 50 MG tablet Take 1 tablet (50 mg total) by mouth daily.   triamcinolone  cream (KENALOG ) 0.1 % APPLY TWICE DAILY    [DISCONTINUED] sertraline  (ZOLOFT ) 25 MG  tablet TAKE ONE TABLET BY MOUTH AT BEDTIME   No facility-administered encounter medications on file as of 09/13/2024.    Allergies  Allergen Reactions   Valium Nausea And Vomiting    Pertinent ROS per HPI, otherwise unremarkable      Objective:  BP (!) 112/53 Comment: at home this morning  Pulse 78   Temp (!) 97.2 F (36.2 C)   Ht 5' 5 (1.651 m)   Wt 132 lb 9.6 oz (60.1 kg)   SpO2 96%   BMI 22.07 kg/m    Wt Readings from Last 3 Encounters:  09/13/24 132 lb 9.6 oz (60.1 kg)  06/27/24 138 lb (62.6 kg)  04/25/24 138 lb 9.6 oz (62.9 kg)    Physical Exam Vitals and nursing note reviewed.  Constitutional:      General: She is not in acute distress.    Appearance: Normal appearance. She is ill-appearing (chronically ill). She is not toxic-appearing or diaphoretic.  HENT:     Head: Normocephalic and atraumatic.     Mouth/Throat:     Mouth: Mucous membranes are moist.  Eyes:     Conjunctiva/sclera: Conjunctivae normal.     Pupils: Pupils are equal, round, and reactive to light.  Cardiovascular:     Rate and Rhythm: Normal rate. Rhythm irregularly irregular.     Pulses:          Dorsalis pedis pulses are 2+ on the right side and 2+ on the left side.       Posterior tibial pulses are 2+ on the right side and 2+ on the left side.     Heart sounds: Murmur heard.     Comments: Crisp valve click Pulmonary:     Effort: Pulmonary effort is normal.     Breath sounds: Normal breath sounds.  Musculoskeletal:     Right lower leg: No edema.     Left lower leg: No edema.     Right foot: Normal range of motion. No deformity, bunion, Charcot foot, foot drop or prominent metatarsal heads.     Left foot: Normal range of motion. No bunion, Charcot foot, foot drop or prominent metatarsal heads.  Feet:     Right foot:     Protective Sensation: 10 sites tested.  10 sites sensed.     Skin integrity: Skin integrity normal.     Toenail Condition:  Right toenails are abnormally thick.     Left foot:     Protective Sensation: 10 sites tested.  10 sites sensed.     Skin integrity: Skin integrity normal.     Toenail Condition: Left toenails are abnormally thick.  Skin:    General: Skin is warm and dry.     Capillary Refill: Capillary refill takes less than 2 seconds.  Neurological:     General: No focal deficit present.     Mental Status: She is alert and oriented to person, place, and time.     Gait: Gait abnormal (using rollator).  Psychiatric:        Attention and Perception: Attention and perception normal.        Mood and Affect: Mood is depressed.        Speech: Speech normal.        Behavior: Behavior normal.        Thought Content: Thought content normal.        Judgment: Judgment normal.     Results for orders placed or performed during the hospital encounter of 02/29/24  ECHOCARDIOGRAM  COMPLETE   Collection Time: 02/29/24 10:27 AM  Result Value Ref Range   AV Mean grad 11.0 mmHg   AV Peak grad 19.9 mmHg   Ao pk vel 2.23 m/s   Area-P 1/2 3.65 cm2   S' Lateral 2.10 cm   MV M vel 5.08 m/s   MV Peak grad 103.2 mmHg   Radius 0.60 cm   Est EF 60 - 65%        Pertinent labs & imaging results that were available during my care of the patient were reviewed by me and considered in my medical decision making.  Assessment & Plan:  Ricquel was seen today for medical management of chronic issues and dental apt.  Diagnoses and all orders for this visit:  Diet-controlled diabetes mellitus (HCC) -     Bayer DCA Hb A1c Waived -     DME Wheelchair manual  Hyperlipidemia associated with type 2 diabetes mellitus (HCC) -     Bayer DCA Hb A1c Waived -     DME Wheelchair manual  Hypertension associated with diabetes (HCC) -     Bayer DCA Hb A1c Waived -     DME Wheelchair manual  Hypertensive heart disease with chronic diastolic congestive heart failure (HCC) -     Bayer DCA Hb A1c Waived -     DME Wheelchair  manual  Persistent atrial fibrillation (HCC) -     Bayer DCA Hb A1c Waived -     DME Wheelchair manual  Depression, major, single episode, moderate (HCC) -     Bayer DCA Hb A1c Waived -     VITAMIN D  25 Hydroxy (Vit-D Deficiency, Fractures) -     sertraline  (ZOLOFT ) 50 MG tablet; Take 1 tablet (50 mg total) by mouth daily. -     DME Wheelchair manual  Vitamin D  deficiency -     VITAMIN D  25 Hydroxy (Vit-D Deficiency, Fractures)  S/P TAVR (transcatheter aortic valve replacement) -     Bayer DCA Hb A1c Waived -     amoxicillin  (AMOXIL ) 500 MG capsule; Take 4 capsules (2,000 mg total) by mouth once as needed for up to 1 dose (30-60 minutes prior to dental appointment). -     DME Wheelchair manual  Chronic bilateral low back pain without sciatica -     VITAMIN D  25 Hydroxy (Vit-D Deficiency, Fractures) -     DME Wheelchair manual  Need for vaccination -     Flu vaccine HIGH DOSE PF(Fluzone Trivalent)       Atrial fibrillation with prosthetic aortic valve (TAVR) Atrial fibrillation is well-managed with no new or worsening palpitations. Requires antibiotic prophylaxis for dental procedures due to prosthetic aortic valve (TAVR). - Prescribe amoxicillin  2 grams to be taken 30 minutes to 1 hour before dental appointments in October.  Type 2 diabetes mellitus, well controlled Type 2 diabetes mellitus is well controlled with diet. Hemoglobin A1c is 5.7%.  Depression Depression is managed with sertraline  25 mg, which is lower than the typical dose. Reports excessive sleep and lack of motivation, indicating suboptimal control of depressive symptoms. - Increase sertraline  to 50 mg daily. Instruct to take two 25 mg tablets until current supply is exhausted, then switch to one 50 mg tablet daily.  Mobility impairment Mobility impairment due to difficulty walking long distances. Considering a manual wheelchair to facilitate mobility in stores and outings. - Complete DME form for a manual  wheelchair prescription. Instruct to contact [employer] or [employer] for availability.  General Health Maintenance Discussed dietary recommendations for weight management and overall health. Emphasized high protein, low carbohydrate diet, and adequate water  intake.  Follow-up - Schedule follow-up with cardiologist in November. - Ensure prescription for amoxicillin  is sent to [facility] pharmacy. - Complete DMV form for handicap placard renewal.        Continue all other maintenance medications.  Follow up plan: Return in 6 months (on 03/13/2025), or if symptoms worsen or fail to improve, for DM, Depression.   Continue healthy lifestyle choices, including diet (rich in fruits, vegetables, and lean proteins, and low in salt and simple carbohydrates) and exercise (at least 30 minutes of moderate physical activity daily).  Educational handout given for DM  The above assessment and management plan was discussed with the patient. The patient verbalized understanding of and has agreed to the management plan. Patient is aware to call the clinic if they develop any new symptoms or if symptoms persist or worsen. Patient is aware when to return to the clinic for a follow-up visit. Patient educated on when it is appropriate to go to the emergency department.   Rosaline Bruns, FNP-C [facility] [phone]

## 2024-09-13 NOTE — Patient Instructions (Signed)

## 2024-09-14 ENCOUNTER — Ambulatory Visit: Payer: Self-pay | Admitting: Family Medicine

## 2024-09-14 LAB — VITAMIN D 25 HYDROXY (VIT D DEFICIENCY, FRACTURES): Vit D, 25-Hydroxy: 45.7 ng/mL (ref 30.0–100.0)

## 2024-10-14 ENCOUNTER — Other Ambulatory Visit: Payer: Self-pay | Admitting: Family Medicine

## 2024-10-14 DIAGNOSIS — I152 Hypertension secondary to endocrine disorders: Secondary | ICD-10-CM

## 2024-10-14 DIAGNOSIS — I110 Hypertensive heart disease with heart failure: Secondary | ICD-10-CM

## 2024-10-27 ENCOUNTER — Encounter (INDEPENDENT_AMBULATORY_CARE_PROVIDER_SITE_OTHER): Payer: Self-pay | Admitting: Gastroenterology

## 2024-11-15 ENCOUNTER — Encounter: Payer: Self-pay | Admitting: Cardiology

## 2024-11-15 ENCOUNTER — Ambulatory Visit: Attending: Cardiology | Admitting: Cardiology

## 2024-11-15 VITALS — BP 134/78 | HR 94 | Ht 65.5 in | Wt 136.0 lb

## 2024-11-15 DIAGNOSIS — I10 Essential (primary) hypertension: Secondary | ICD-10-CM | POA: Diagnosis not present

## 2024-11-15 DIAGNOSIS — Z952 Presence of prosthetic heart valve: Secondary | ICD-10-CM | POA: Diagnosis not present

## 2024-11-15 DIAGNOSIS — D6869 Other thrombophilia: Secondary | ICD-10-CM

## 2024-11-15 DIAGNOSIS — I2510 Atherosclerotic heart disease of native coronary artery without angina pectoris: Secondary | ICD-10-CM | POA: Diagnosis not present

## 2024-11-15 DIAGNOSIS — E782 Mixed hyperlipidemia: Secondary | ICD-10-CM

## 2024-11-15 DIAGNOSIS — I4891 Unspecified atrial fibrillation: Secondary | ICD-10-CM

## 2024-11-15 MED ORDER — AMOXICILLIN 500 MG PO CAPS: 2000.0000 mg | ORAL_CAPSULE | Freq: Once | ORAL | 1 refills | Status: DC | PRN

## 2024-11-15 NOTE — Progress Notes (Signed)
 Clinical Summary [name] is a 88 y.o.female seen today for follow up of the following medical problems.   1.Persistent afib - rare infrequent palpitations - compliant with meds    2.Falls - can have some lightheadedness, dizziness at times. - typically occurs shortly after standing, often in the midmorning.    3. CAD - prior PCI to RCA in 1999. Repeat PCI to RCA 2012. Later had PTCA of RCA stent ISR - 11/2022 cath: no significant CAD, patent RCA stents.   - no recent chest pains.      3. Chronic HFpEF - can have some leg edema, typically L>R.  - has lasix  prn, has compression stockings. Only needs lasix  about 1-2 times per month.    - Some SOB at times. No recent edema.    4. Aortic stenosis s/p TAVR - s/p successful TAVR with a 23 mm Edwards Sapien 3 THV via the Farmer approach on 01/27/23  - post procedure complicated by anemia, large extraperitoneal hematoma   - 02/2024 echo: LVEF 60-65%,  no WMAs, normal TAVR valve     5. HTN - from notes norvasc  had been avoided as she had history of edema not related to norvasc .  - reports high bp's often at doctors visits.  - she is compliant with meds   -    6. HLD - 01/2024 TC 111 TG 72 HDL 44 LDL 52 - she is on atorvastatin  20mg  daily.  Past Medical History:  Diagnosis Date   A-fib [facility])    Anxiety    CAD S/P percutaneous coronary angioplasty 1990s, 11/28/2011    a) midRCA PCI 1999 (NIR BMS 3.0 mm x 32 mm); distal  RCA BMS Sept 2012 (Vision BMS 2.5 mm x 15 mm); 12/02: RCA ISR --> Cutting PTCA    Coronary stent restenosis due to progression of disease 11/30/2011   Dyslipidemia, goal LDL below 70 11/28/2011   Essential hypertension    On ACE inhibitor and ARB? As well as atenolol  and hydralazine    Gastric ulcer 11/2018   GERD (gastroesophageal reflux disease)    History of blood transfusion    without reaction   Moderate aortic stenosis by prior echocardiogram 08/2011; 08/2013   Echo 11/09/18: EF 60-65%. Gr 2 DD.   No RWMA.  Mild-Mod AS (mean gradient 19 mmHg). Mild-Mod PAH. ->  Follow-up echo March 2021 showed mean gradient 17 mmHg (dimensionless index 0.32)-this was reported as moderate-severe AS   Myocardial infarction (HCC) 1990s   Had PTCA then PCI on RCA; 11/2011--> Dyspnea and Shoulder pain prior to adm    Non-ST elevation MI (NSTEMI) (HCC) 11/28/2011   Dyspnea and Shoulder pain prior to adm   S/P TAVR (transcatheter aortic valve replacement) 01/27/2023   23mm S3UR via [place] approach with Dr. Verlin and Dr. Lucas   Type 2 diabetes mellitus with complication, without long-term current use of insulin  (HCC)    no longer taking diabetic medication - diet controlled     Allergies  Allergen Reactions   Valium Nausea And Vomiting     Current Outpatient Medications  Medication Sig Dispense Refill   amoxicillin  (AMOXIL ) 500 MG capsule Take 4 capsules (2,000 mg total) by mouth once as needed for up to 1 dose (30-60 minutes prior to dental appointment). 4 capsule 1   apixaban  (ELIQUIS ) 2.5 MG TABS tablet Take 1 tablet (2.5 mg total) by mouth 2 (two) times daily. 180 tablet 3   atorvastatin  (LIPITOR) 20 MG tablet TAKE ONE (  1) TABLET EACH DAY 90 tablet 1   benazepril  (LOTENSIN ) 40 MG tablet TAKE ONE (1) TABLET BY MOUTH EVERY DAY 90 tablet 1   carvedilol  (COREG ) 25 MG tablet TAKE ONE (1) TABLET BY MOUTH TWO (2) TIMES DAILY 180 tablet 0   diphenhydramine -acetaminophen  (TYLENOL  PM) 25-500 MG TABS tablet Take 2 tablets by mouth at bedtime.     furosemide  (LASIX ) 40 MG tablet Take 1 tablet (40 mg total) by mouth daily as needed for fluid. 90 tablet 3   hydrALAZINE  (APRESOLINE ) 100 MG tablet TAKE ONE (1) TABLET BY MOUTH TWO (2) TIMES DAILY 180 tablet 1   Iron , Ferrous Sulfate , 325 (65 Fe) MG TABS Take 325 mg by mouth daily. 30 tablet 3   loperamide  (IMODIUM  A-D) 2 MG tablet Take 2 mg by mouth 4 (four) times daily as needed for diarrhea or loose stools.     NITROSTAT  0.4 MG SL tablet DISSOLVE 1 TAB UNDER  TOUNGE FOR CHEST PAIN. MAY REPEAT EVERY 5 MINUTES FOR 3 DOSES. IF NO RELIEF CALL 911 OR GO TO ER 25 tablet 3   pantoprazole  (PROTONIX ) 40 MG tablet TAKE 1 TABLET BY MOUTH TWICE DAILY BEFORE MEALS 60 tablet 5   sertraline  (ZOLOFT ) 50 MG tablet Take 1 tablet (50 mg total) by mouth daily. 90 tablet 3   triamcinolone  cream (KENALOG ) 0.1 % APPLY TWICE DAILY 30 g 0   No current facility-administered medications for this visit.     Past Surgical History:  Procedure Laterality Date   ABDOMINAL HYSTERECTOMY     BIOPSY  06/03/2018   Procedure: BIOPSY;  Surgeon: Golda Claudis PENNER, MD;  Location: [facility];  Service: Endoscopy;;  gastric    BIOPSY  09/15/2022   Procedure: BIOPSY;  Surgeon: Cindie Carlin POUR, DO;  Location: [facility];  Service: Endoscopy;;   BREAST BIOPSY     CARDIAC CATHETERIZATION  2012   performed for angina   CARDIAC CATHETERIZATION     CARDIOVERSION N/A 10/24/2020   Procedure: CARDIOVERSION;  Surgeon: Jeffrie Oneil BROCKS, MD;  Location: [facility];  Service: Cardiovascular;  Laterality: N/A;   CORONARY ANGIOPLASTY  11/2006   Cutting Balloon of stent in the RCA   ESOPHAGOGASTRODUODENOSCOPY N/A 06/03/2018   Procedure: ESOPHAGOGASTRODUODENOSCOPY (EGD);  Surgeon: Golda Claudis PENNER, MD;  Location: [facility];  Service: Endoscopy;  Laterality: N/A;  1:40   ESOPHAGOGASTRODUODENOSCOPY N/A 12/02/2018   Procedure: ESOPHAGOGASTRODUODENOSCOPY (EGD);  Surgeon: Golda Claudis PENNER, MD;  Location: [facility];  Service: Endoscopy;  Laterality: N/A;  255   ESOPHAGOGASTRODUODENOSCOPY (EGD) WITH PROPOFOL  N/A 09/15/2022   Procedure: ESOPHAGOGASTRODUODENOSCOPY (EGD) WITH PROPOFOL ;  Surgeon: Cindie Carlin POUR, DO;  Location: [facility];  Service: Endoscopy;  Laterality: N/A;  1:00pm, asa 3   LEFT HEART CATHETERIZATION WITH CORONARY ANGIOGRAM N/A 11/27/2011   Procedure: LEFT HEART CATHETERIZATION WITH CORONARY ANGIOGRAM;  Surgeon: Alm LELON Clay, MD;  Location: [facility] CATH LAB;  Service:  Cardiovascular;  Laterality: N/A;   PERCUTANEOUS CORONARY STENT INTERVENTION (PCI-S)  1999   Mid RCA - NIR DMS 3.0 mm x 30 mm, dRCA Vision BMS 2.5 mm x 15 mm    RIGHT HEART CATH AND CORONARY ANGIOGRAPHY N/A 11/24/2022   Procedure: RIGHT HEART CATH AND CORONARY ANGIOGRAPHY;  Surgeon: Clay Alm LELON, MD;  Location: [facility] INVASIVE CV LAB;  Service: Cardiovascular;  Laterality: N/A;   TEE WITHOUT CARDIOVERSION N/A 01/27/2023   Procedure: TRANSESOPHAGEAL ECHOCARDIOGRAM;  Surgeon: Verlin Lonni BIRCH, MD;  Location: [facility] OR;  Service: Open Heart  Surgery;  Laterality: N/A;   TRANSTHORACIC ECHOCARDIOGRAM  10/2018   EF 60-65%. Gr 2 DD.  No RWMA.  Mild-Mod AS  (mean gradient 19 mmHg). Mild-Mod PAH.   TRANSTHORACIC ECHOCARDIOGRAM  03/01/2020   EF 70 to 75%.  No or WMA.  Mild LVH.  GRII DD with severe LA enlargement.  Mildly elevated PAP.  Mild MR.  Moderate AS (mean gradient 17 mmHg) with dimensionless index of 0.32.   TUBAL LIGATION       Allergies  Allergen Reactions   Valium Nausea And Vomiting      Family History  Problem Relation Age of Onset   Cancer Mother    Heart disease Father    Heart disease Maternal Grandmother    Heart disease Maternal Grandfather    Heart disease Brother    Nephrolithiasis Brother        accidental death   Heart Problems Brother        CABG     Social History Ms. Schwalb reports that she has quit smoking. She has never used smokeless tobacco. Ms. Berish reports no history of alcohol use.    Physical Examination Today's Vitals   11/15/24 1415 11/15/24 1456  BP: (!) 146/76 134/78  Pulse: 94   SpO2: 98%   Weight: 136 lb (61.7 kg)   Height: 5' 5.5 (1.664 m)    Body mass index is 22.29 kg/m.  Gen: resting comfortably, no acute distress HEENT: no scleral icterus, pupils equal round and reactive, no palptable cervical adenopathy,  CV: irreg, no mrg, no jvd Resp: Clear to auscultation bilaterally GI: abdomen is soft, non-tender, non-distended,  normal bowel sounds, no hepatosplenomegaly MSK: extremities are warm, no edema.  Skin: warm, no rash Neuro:  no focal deficits Psych: appropriate affect   Diagnostic Studies     Assessment and Plan   1.CAD - no symptoms, continue curren tmeds   2. Aortic stenosis s/p TAVR - recent echo shows normal functioning TAVR valve - no symptoms, continue to monitor   3. HTN - she reports prior history of white coat HTN - bp's reasonable here in clniic, particularly with some orthostatic dizziness and falls - continue current meds  4. Dizziness - sounds like orthostatic dizziness by history. Poor oral hydration, discussed increasing her daily hydration. She uses her prn lasix  very sparingly.  - orthostatics borderline, DBP drops 8 points but is symptomatic with position change   5. HLD - she is at goal, continue current meds   6. Afib/acquired thrombophilia - no recent symptoms  EKG shows rate controlled - weight varies above and below 60 kg, for time being we have remained on the eliquis  2.5mg  bid. If consistently above 60kg over time transition to 5mg  bid dosing  F/u 6 months    [name], M.D.

## 2024-11-15 NOTE — Patient Instructions (Addendum)
 Medication Instructions:   Amoxicillin  refilled today  Continue all other medications.     Labwork:  none  Testing/Procedures:  none  Follow-Up:  6 months   Any Other Special Instructions Will Be Listed Below (If Applicable).  Increase hydration.    If you need a refill on your cardiac medications before your next appointment, please call your pharmacy.

## 2024-11-23 ENCOUNTER — Other Ambulatory Visit: Payer: Self-pay | Admitting: Gastroenterology

## 2024-11-23 DIAGNOSIS — K921 Melena: Secondary | ICD-10-CM

## 2024-11-23 DIAGNOSIS — Z8711 Personal history of peptic ulcer disease: Secondary | ICD-10-CM

## 2024-12-05 ENCOUNTER — Other Ambulatory Visit: Payer: Self-pay | Admitting: Family Medicine

## 2024-12-05 DIAGNOSIS — I152 Hypertension secondary to endocrine disorders: Secondary | ICD-10-CM

## 2024-12-05 DIAGNOSIS — I110 Hypertensive heart disease with heart failure: Secondary | ICD-10-CM

## 2024-12-22 ENCOUNTER — Encounter: Payer: Self-pay | Admitting: Gastroenterology

## 2025-01-05 ENCOUNTER — Other Ambulatory Visit: Payer: Self-pay | Admitting: Family Medicine

## 2025-01-20 NOTE — ED Provider Notes (Addendum)
 "                                                                                   Emergency Department Provider Note    ED Clinical Impression   Final diagnoses:  Seizures    (CMS-HCC) (Primary)  Bradycardia  Cardiac arrest (CMS-HCC)  Atrial fibrillation, unspecified type    (CMS-HCC)    ED Assessment/Plan    Condition: Expired Disposition: Discharge  This chart has been completed using Dragon Medical Dictation software, and while attempts have been made to ensure accuracy, certain words and phrases may not be transcribed as intended.   History   Chief Complaint  Patient presents with   Possible Seizure   HPI  [name] is a 89 y.o. female  who presents today to the  emergency department complaining of seizure activity.  EMS reports that they were called because patient apparently had a seizure and was unresponsive.  Patient has no prior history of seizures.  Upon arrival, EMS reports generalized tonic-clonic seizure activity.  Patient then slumped to her left.  Upon arrival, patient was longer seizing.  Patient was hypotensive and bradycardic.  She did have some slight agonal respirations.  Patient was unable to give us  any history.  Given profound bradycardia, atropine was probably ordered.  Pacer pads were also ordered.  Unfortunately, patient went to cardiac arrest shortly thereafter.  ACLS was initiated as patient's CODE STATUS was felt to be full code per EMS reporting.  Family then came and advised that patient status was DNR.  ACLS was taken stopped.  However, patient had regained spontaneous circulation at the time.    Allergies: is allergic to diazepam. Medications: has a current medication list which includes the following long-term medication(s): atorvastatin , benazepril , carvedilol , furosemide , hydralazine , and sertraline . PMHx:  has a past medical history of Aortic stenosis, GERD (gastroesophageal reflux disease), Hyperlipidemia, and  Hypertension. PSHx:  has a past surgical history that includes Hysterectomy and Coronary angioplasty with stent. SocHx:  reports that she quit smoking about 33 years ago. Her smoking use included cigarettes. She started smoking about 53 years ago. She has a 20 pack-year smoking history. She has never been exposed to tobacco smoke. She has never used smokeless tobacco. She reports that she does not currently use alcohol. She reports that she does not use drugs. Allergies, Medications, Medical, Surgical, and Social History were reviewed as documented above.   Social Drivers of Health with Concerns   Tobacco Use: Medium Risk (11/15/2024)   Received [facility]   Patient History    Smoking Tobacco Use: Former    Smokeless Tobacco Use: Never    Passive Exposure: Not on file  Alcohol Use: Not on file  Housing: Not on file  Physical Activity: Inactive (06/27/2024)   Received from [facility]   Exercise Vital Sign    On average, how many days per week do you engage in moderate to strenuous exercise (like a brisk walk)?: 0 days    On average, how many minutes do you engage in exercise at this level?: 0 min  Interpersonal Safety: Not on file  Substance Use: Not on file (10/27/2023)  Social Connections: Socially Isolated (06/27/2024)   Received from [facility]   Social Connection and Isolation Panel    In a typical week, how many times do you talk on the phone with family, friends, or neighbors?: More than three times a week    How often do you get together with friends or relatives?: More than three times a week    How often do you attend church or religious services?: Never    Do you belong to any clubs or organizations such as church groups, unions, fraternal or athletic groups, or school groups?: No    How often do you attend meetings of the clubs or organizations you belong to?: Never    Are you married, widowed, divorced, separated, never married, or living with a partner?:  Widowed  Insurance Account Manager: Not on file     Review Of Systems  Review of Systems  Unable to perform ROS: Acuity of condition    Physical Exam   BP 128/68   Pulse (!) 130   Resp (!) 23   SpO2 97%   Physical Exam Vitals and nursing note reviewed.  Constitutional:      General: She is in acute distress.  HENT:     Head: Normocephalic.  Eyes:     Conjunctiva/sclera: Conjunctivae normal.  Cardiovascular:     Rate and Rhythm: Bradycardia present.     Pulses: Normal pulses.     Heart sounds: Normal heart sounds.  Pulmonary:     Effort: Respiratory distress present.     Breath sounds: Normal breath sounds.     Comments: Slow, agonal respirations. Abdominal:     General: There is no distension.  Musculoskeletal:        General: No deformity.  Skin:    General: Skin is warm.     Capillary Refill: Capillary refill takes 2 to 3 seconds.     Comments: Normal cap refill.  Neurological:     General: No focal deficit present.  Psychiatric:        Mood and Affect: Mood normal.     ED Course  Medical Decision Making Clinical picture suggest doing bradycardia in a patient with a history of A-fib.  New onset seizures also in the differential.  Patient is anticoagulated so were concerned about intracranial bleed.  Differential diagnosis also includes electrolyte abnormality with sepsis.  Patient's initial EKG did show junctional bradycardia with a heart rate in the 30s.  Possible LV hypertrophy.  Initial ACLS was started when patient arrested.  We were able to give return of spontaneous circulation.  Family later and told us  that patient was DNR.  Did not want anything else done.  Consequently, imaging studies that had been ordered were canceled.  Labs came back which showed significant anemia.  Family's wishes were DNR and comfort measures.  Patient eventually arrested again and passed peacefully with patient's family at the bedside.  2:05 PM I spoke with with patient's  PCP Rock Bruns.  She was signed that certificate.  Amount and/or Complexity of Data Reviewed Labs: ordered. Radiology: ordered. ECG/medicine tests: ordered.  Risk Prescription drug management. Decision not to resuscitate or to de-escalate care because of poor prognosis.     Critical Care  Performed by: Fote, Ardeen Hanger, MD Authorized by: Fote, Ardeen Hanger, MD   Critical care provider statement:    Critical care time (minutes):  30   Critical care time was exclusive of:  Separately billable procedures and treating other patients  Critical care was necessary to treat or prevent imminent or life-threatening deterioration of the following conditions:  Cardiac failure and CNS failure or compromise   Critical care was time spent personally by me on the following activities:  Discussions with primary provider and examination of patient    Encounter Date: 07-Feb-2025  ECG 12 Lead  Result Value   EKG Systolic BP    EKG Diastolic BP    EKG Ventricular Rate 138   EKG Atrial Rate    EKG P-R Interval    EKG QRS Duration 76   EKG Q-T Interval 308   EKG QTC Calculation 466   EKG Calculated P Axis    EKG Calculated R Axis 68   EKG Calculated T Axis 90   QTC Fredericia 406     ED Results Results for orders placed or performed during the hospital encounter of February 07, 2025  Comprehensive Metabolic Panel  Result Value Ref Range   Sodium 140 135 - 145 mmol/L   Potassium 4.5 3.5 - 5.0 mmol/L   Chloride 104 98 - 107 mmol/L   CO2 19.2 (L) 21.0 - 32.0 mmol/L   Anion Gap >15 (H) 3 - 11 mmol/L   BUN 36 (H) 8 - 20 mg/dL   Creatinine 8.53 (H) 9.39 - 1.10 mg/dL   BUN/Creatinine Ratio 25    eGFR CKD-EPI (2021) Female 35 (L) >=60 mL/min/1.51m2   Glucose 258 (H) 70 - 179 mg/dL   Calcium  8.7 8.5 - 10.1 mg/dL   Albumin 3.4 (L) 3.5 - 5.0 g/dL   Total Protein 7.6 6.0 - 8.0 g/dL   Total Bilirubin 0.5 0.3 - 1.2 mg/dL   AST 9 (L) 15 - 40 U/L   ALT 8 (L) 12 - 78 U/L   Alkaline Phosphatase 53  46 - 116 U/L  Lactate Sepsis  Result Value Ref Range   Lactate 8.1 (HH) 0.5 - 1.9 mmol/L  aPTT  Result Value Ref Range   APTT 28.1 24.8 - 38.4 sec  Protime-INR  Result Value Ref Range   PT 20.1 (H) 9.9 - 12.6 sec   INR 1.83 Undefined  ECG 12 Lead  Result Value Ref Range   EKG Systolic BP  mmHg   EKG Diastolic BP  mmHg   EKG Ventricular Rate 138 BPM   EKG Atrial Rate  BPM   EKG P-R Interval  ms   EKG QRS Duration 76 ms   EKG Q-T Interval 308 ms   EKG QTC Calculation 466 ms   EKG Calculated P Axis  degrees   EKG Calculated R Axis 68 degrees   EKG Calculated T Axis 90 degrees   QTC Fredericia 406 ms  CBC w/ Differential  Result Value Ref Range   WBC 6.7 4.0 - 10.5 10*9/L   RBC 3.04 (L) 3.80 - 5.10 10*12/L   HGB 5.1 (LL) 11.5 - 15.0 g/dL   HCT 79.3 (LL) 65.9 - 44.0 %   MCV 67.8 (L) 80.0 - 98.0 fL   MCH 16.8 (L) 27.0 - 34.0 pg   MCHC 24.8 (L) 32.0 - 36.0 g/dL   RDW 80.6 (H) 88.4 - 85.4 %   MPV 10.0 7.4 - 10.4 fL   Platelet 403 140 - 415 10*9/L   nRBC 2 (H) <=1 /100 WBCs   No results found.  Medications Administered:  Medications  sodium chloride  0.9% (NS) bolus 30 mL/kg (has no administration in time range)  atropine 0.1 mg/mL injection 1 mg (has no administration in time range)  Discharge Medications (Medications Prescribed during this  ED visit and Patient's Home Medications) :    Your Medication List     ASK your doctor about these medications    acetaminophen  500 MG tablet Commonly known as: TYLENOL  Take 2 tablets (1,000 mg total) by mouth.   apixaban  2.5 mg Tab Commonly known as: ELIQUIS  Take 1 tablet (2.5 mg total) by mouth.   atorvastatin  20 MG tablet Commonly known as: LIPITOR   benazepril  40 MG tablet Commonly known as: LOTENSIN  Take 1 tablet (40 mg total) by mouth daily.   carvedilol  25 MG tablet Commonly known as: COREG  Take 1 tablet (25 mg total) by mouth two (2) times a day.   diphenhydrAMINE -acetaminophen  25-500 mg Tab Commonly  known as: TYLENOL  PM Take 1 tablet by mouth nightly.   furosemide  40 MG tablet Commonly known as: LASIX  Take 1 tablet (40 mg total) by mouth.   hydrALAZINE  100 MG tablet Commonly known as: APRESOLINE    NITROSTAT  0.4 MG SL tablet Generic drug: nitroglycerin  DISSOLVE 1 TAB UNDER TOUNGE FOR CHEST PAIN. MAY REPEAT EVERY 5 MINUTES FOR 3 DOSES. IF NO RELIEF CALL 911 OR GO TO ER   pantoprazole  40 MG tablet Commonly known as: Protonix  Take 1 tablet (40 mg total) by mouth.   sertraline  25 MG tablet Commonly known as: ZOLOFT  Take 1 tablet (25 mg total) by mouth daily at 0600.   traMADol  50 mg tablet Commonly known as: ULTRAM  Take 1 tablet (50 mg total) by mouth every six (6) hours as needed.          Cherie Ardeen Hanger, MD 02-Feb-2025 1407    Cherie Ardeen Hanger, MD 2025-02-02 1447  "

## 2025-01-22 DEATH — deceased

## 2025-03-14 ENCOUNTER — Ambulatory Visit: Payer: Self-pay | Admitting: Family Medicine

## 2025-06-28 ENCOUNTER — Ambulatory Visit: Payer: Self-pay
